# Patient Record
Sex: Male | Born: 1940 | Race: White | Hispanic: No | Marital: Single | Smoking: Former smoker
Health system: Southern US, Community
[De-identification: ages and names within clinical notes are randomized; demographics above are authoritative.]

## PROBLEM LIST (undated history)

## (undated) DIAGNOSIS — Z720 Tobacco use: Secondary | ICD-10-CM

## (undated) DIAGNOSIS — I5022 Chronic systolic (congestive) heart failure: Secondary | ICD-10-CM

## (undated) DIAGNOSIS — I10 Essential (primary) hypertension: Secondary | ICD-10-CM

## (undated) DIAGNOSIS — E785 Hyperlipidemia, unspecified: Secondary | ICD-10-CM

## (undated) DIAGNOSIS — I2510 Atherosclerotic heart disease of native coronary artery without angina pectoris: Secondary | ICD-10-CM

## (undated) DIAGNOSIS — I255 Ischemic cardiomyopathy: Secondary | ICD-10-CM

## (undated) DIAGNOSIS — I509 Heart failure, unspecified: Secondary | ICD-10-CM

## (undated) HISTORY — PX: INGUINAL HERNIA REPAIR: SUR1180

---

## 1998-02-06 ENCOUNTER — Encounter: Admission: RE | Admit: 1998-02-06 | Discharge: 1998-02-06 | Payer: Self-pay | Admitting: Hematology and Oncology

## 1998-03-12 ENCOUNTER — Encounter: Admission: RE | Admit: 1998-03-12 | Discharge: 1998-03-12 | Payer: Self-pay | Admitting: Hematology and Oncology

## 1998-03-18 ENCOUNTER — Encounter: Admission: RE | Admit: 1998-03-18 | Discharge: 1998-03-18 | Payer: Self-pay | Admitting: Hematology and Oncology

## 1998-04-25 ENCOUNTER — Encounter: Admission: RE | Admit: 1998-04-25 | Discharge: 1998-04-25 | Payer: Self-pay | Admitting: *Deleted

## 1998-07-17 ENCOUNTER — Encounter: Admission: RE | Admit: 1998-07-17 | Discharge: 1998-07-17 | Payer: Self-pay | Admitting: Internal Medicine

## 1998-07-25 ENCOUNTER — Encounter: Admission: RE | Admit: 1998-07-25 | Discharge: 1998-07-25 | Payer: Self-pay | Admitting: Internal Medicine

## 1998-09-11 ENCOUNTER — Encounter: Payer: Self-pay | Admitting: Internal Medicine

## 1998-09-11 ENCOUNTER — Ambulatory Visit (HOSPITAL_COMMUNITY): Admission: RE | Admit: 1998-09-11 | Discharge: 1998-09-11 | Payer: Self-pay | Admitting: Internal Medicine

## 1998-09-11 ENCOUNTER — Encounter: Admission: RE | Admit: 1998-09-11 | Discharge: 1998-09-11 | Payer: Self-pay | Admitting: Internal Medicine

## 1998-10-16 ENCOUNTER — Encounter: Admission: RE | Admit: 1998-10-16 | Discharge: 1998-10-16 | Payer: Self-pay | Admitting: Internal Medicine

## 1998-11-17 ENCOUNTER — Encounter: Admission: RE | Admit: 1998-11-17 | Discharge: 1998-11-17 | Payer: Self-pay | Admitting: Internal Medicine

## 1999-02-12 ENCOUNTER — Encounter: Admission: RE | Admit: 1999-02-12 | Discharge: 1999-02-12 | Payer: Self-pay | Admitting: Internal Medicine

## 1999-04-28 ENCOUNTER — Encounter: Admission: RE | Admit: 1999-04-28 | Discharge: 1999-04-28 | Payer: Self-pay | Admitting: Internal Medicine

## 1999-06-18 ENCOUNTER — Ambulatory Visit (HOSPITAL_BASED_OUTPATIENT_CLINIC_OR_DEPARTMENT_OTHER): Admission: RE | Admit: 1999-06-18 | Discharge: 1999-06-18 | Payer: Self-pay | Admitting: Orthopaedic Surgery

## 1999-09-28 ENCOUNTER — Encounter: Admission: RE | Admit: 1999-09-28 | Discharge: 1999-09-28 | Payer: Self-pay | Admitting: Internal Medicine

## 1999-12-03 ENCOUNTER — Ambulatory Visit (HOSPITAL_COMMUNITY): Admission: RE | Admit: 1999-12-03 | Discharge: 1999-12-03 | Payer: Self-pay | Admitting: Gastroenterology

## 1999-12-31 ENCOUNTER — Ambulatory Visit (HOSPITAL_COMMUNITY): Admission: RE | Admit: 1999-12-31 | Discharge: 1999-12-31 | Payer: Self-pay | Admitting: Gastroenterology

## 1999-12-31 ENCOUNTER — Encounter (INDEPENDENT_AMBULATORY_CARE_PROVIDER_SITE_OTHER): Payer: Self-pay | Admitting: *Deleted

## 2001-07-07 ENCOUNTER — Encounter: Payer: Self-pay | Admitting: Internal Medicine

## 2001-07-07 ENCOUNTER — Ambulatory Visit (HOSPITAL_COMMUNITY): Admission: RE | Admit: 2001-07-07 | Discharge: 2001-07-07 | Payer: Self-pay | Admitting: Internal Medicine

## 2001-07-14 ENCOUNTER — Ambulatory Visit (HOSPITAL_COMMUNITY): Admission: RE | Admit: 2001-07-14 | Discharge: 2001-07-14 | Payer: Self-pay | Admitting: Gastroenterology

## 2005-06-11 ENCOUNTER — Ambulatory Visit: Payer: Self-pay | Admitting: Oncology

## 2005-06-14 ENCOUNTER — Ambulatory Visit (HOSPITAL_COMMUNITY): Admission: RE | Admit: 2005-06-14 | Discharge: 2005-06-14 | Payer: Self-pay | Admitting: Internal Medicine

## 2005-11-16 ENCOUNTER — Ambulatory Visit: Payer: Self-pay | Admitting: Oncology

## 2007-03-01 ENCOUNTER — Encounter: Payer: Self-pay | Admitting: Internal Medicine

## 2007-03-01 ENCOUNTER — Ambulatory Visit: Payer: Self-pay | Admitting: Vascular Surgery

## 2007-03-01 ENCOUNTER — Inpatient Hospital Stay (HOSPITAL_COMMUNITY): Admission: EM | Admit: 2007-03-01 | Discharge: 2007-03-02 | Payer: Self-pay | Admitting: Emergency Medicine

## 2007-03-01 ENCOUNTER — Ambulatory Visit: Payer: Self-pay | Admitting: Internal Medicine

## 2007-03-01 ENCOUNTER — Encounter: Payer: Self-pay | Admitting: Vascular Surgery

## 2009-07-29 ENCOUNTER — Inpatient Hospital Stay (HOSPITAL_COMMUNITY): Admission: EM | Admit: 2009-07-29 | Discharge: 2009-08-01 | Payer: Self-pay | Admitting: Emergency Medicine

## 2011-02-12 LAB — URINALYSIS, ROUTINE W REFLEX MICROSCOPIC
Bilirubin Urine: NEGATIVE
Glucose, UA: NEGATIVE mg/dL
Ketones, ur: NEGATIVE mg/dL
pH: 6 (ref 5.0–8.0)

## 2011-02-12 LAB — GLUCOSE, CAPILLARY: Glucose-Capillary: 98 mg/dL (ref 70–99)

## 2011-02-12 LAB — BASIC METABOLIC PANEL
BUN: <1 mg/dL — ABNORMAL LOW (ref 6–23)
Calcium: 8.6 mg/dL (ref 8.4–10.5)
GFR calc non Af Amer: >60 mL/min (ref >60)
Potassium: 3.9 mEq/L (ref 3.5–5.1)
Sodium: 131 mEq/L — ABNORMAL LOW (ref 135–145)

## 2011-02-12 LAB — COMPREHENSIVE METABOLIC PANEL
Albumin: 2.8 g/dL — ABNORMAL LOW (ref 3.5–5.2)
Alkaline Phosphatase: 77 U/L (ref 39–117)
Alkaline Phosphatase: 97 U/L (ref 39–117)
BUN: 3 mg/dL — ABNORMAL LOW (ref 6–23)
BUN: 3 mg/dL — ABNORMAL LOW (ref 6–23)
BUN: 4 mg/dL — ABNORMAL LOW (ref 6–23)
CO2: 22 mEq/L (ref 19–32)
CO2: 26 mEq/L (ref 19–32)
Calcium: 8.2 mg/dL — ABNORMAL LOW (ref 8.4–10.5)
Calcium: 8.3 mg/dL — ABNORMAL LOW (ref 8.4–10.5)
Chloride: 101 mEq/L (ref 96–112)
Creatinine, Ser: 0.53 mg/dL (ref 0.4–1.5)
Creatinine, Ser: 0.6 mg/dL (ref 0.4–1.5)
Creatinine, Ser: 0.76 mg/dL (ref 0.4–1.5)
GFR calc non Af Amer: >60 mL/min (ref >60)
GFR calc non Af Amer: >60 mL/min (ref >60)
Glucose, Bld: 104 mg/dL — ABNORMAL HIGH (ref 70–99)
Glucose, Bld: 92 mg/dL (ref 70–99)
Glucose, Bld: 95 mg/dL (ref 70–99)
Potassium: 3.5 mEq/L (ref 3.5–5.1)
Potassium: 4.1 mEq/L (ref 3.5–5.1)
Total Bilirubin: 0.8 mg/dL (ref 0.3–1.2)
Total Protein: 6.3 g/dL (ref 6.0–8.3)

## 2011-02-12 LAB — DIFFERENTIAL
Basophils Absolute: 0 10*3/uL (ref 0.0–0.1)
Basophils Absolute: 0 10*3/uL (ref 0.0–0.1)
Basophils Relative: 0 % (ref 0–1)
Eosinophils Absolute: 0 10*3/uL (ref 0.0–0.7)
Lymphocytes Relative: 22 % (ref 12–46)
Lymphocytes Relative: 27 % (ref 12–46)
Lymphs Abs: 2.6 10*3/uL (ref 0.7–4.0)
Lymphs Abs: 2.9 10*3/uL (ref 0.7–4.0)
Monocytes Absolute: 1.1 10*3/uL — ABNORMAL HIGH (ref 0.1–1.0)
Monocytes Absolute: 1.3 10*3/uL — ABNORMAL HIGH (ref 0.1–1.0)
Monocytes Relative: 9 % (ref 3–12)
Neutro Abs: 7.1 10*3/uL (ref 1.7–7.7)
Neutro Abs: 7.9 10*3/uL — ABNORMAL HIGH (ref 1.7–7.7)
Neutro Abs: 8.7 10*3/uL — ABNORMAL HIGH (ref 1.7–7.7)
Neutrophils Relative %: 60 % (ref 43–77)
Neutrophils Relative %: 66 % (ref 43–77)
Neutrophils Relative %: 67 % (ref 43–77)
Neutrophils Relative %: 81 % — ABNORMAL HIGH (ref 43–77)

## 2011-02-12 LAB — CULTURE, BLOOD (ROUTINE X 2)

## 2011-02-12 LAB — RAPID URINE DRUG SCREEN, HOSP PERFORMED
Cocaine: NOT DETECTED
Opiates: NOT DETECTED

## 2011-02-12 LAB — CBC
HCT: 37.8 % — ABNORMAL LOW (ref 39.0–52.0)
HCT: 38.7 % — ABNORMAL LOW (ref 39.0–52.0)
HCT: 44.4 % (ref 39.0–52.0)
Hemoglobin: 12.8 g/dL — ABNORMAL LOW (ref 13.0–17.0)
Hemoglobin: 13.1 g/dL (ref 13.0–17.0)
Hemoglobin: 15.1 g/dL (ref 13.0–17.0)
MCHC: 33.9 g/dL (ref 30.0–36.0)
MCV: 95.2 fL (ref 78.0–100.0)
MCV: 95.8 fL (ref 78.0–100.0)
MCV: 95.8 fL (ref 78.0–100.0)
Platelets: 102 10*3/uL — ABNORMAL LOW (ref 150–400)
RBC: 3.97 MIL/uL — ABNORMAL LOW (ref 4.22–5.81)
RBC: 4.02 MIL/uL — ABNORMAL LOW (ref 4.22–5.81)
RBC: 4.04 MIL/uL — ABNORMAL LOW (ref 4.22–5.81)
RBC: 4.63 MIL/uL (ref 4.22–5.81)
RDW: 14.2 % (ref 11.5–15.5)
WBC: 11.9 10*3/uL — ABNORMAL HIGH (ref 4.0–10.5)
WBC: 13 10*3/uL — ABNORMAL HIGH (ref 4.0–10.5)

## 2011-02-12 LAB — URINE CULTURE

## 2011-02-12 LAB — MAGNESIUM: Magnesium: 1.7 mg/dL (ref 1.5–2.5)

## 2011-02-12 LAB — LACTIC ACID, PLASMA: Lactic Acid, Venous: 3.2 mmol/L — ABNORMAL HIGH (ref 0.5–2.2)

## 2011-02-12 LAB — PHOSPHORUS
Phosphorus: 4.1 mg/dL (ref 2.3–4.6)
Phosphorus: 4.2 mg/dL (ref 2.3–4.6)

## 2011-03-26 NOTE — Procedures (Signed)
[place]  Patient:    Vincent Dunlap, Vincent Dunlap                         MRN: [id number] Proc. Date: 12/31/99 Adm. Date:  [id number] Attending:  Juanita Craver CC:         Dianna Rossetti. Cloward, M.D., [place], [place]., [place]                           Procedure Report  DATE OF BIRTH:  01-14-1941.  REFERRING PHYSICIAN:  Davis L. Cloward, M.D.  PROCEDURE PERFORMED:  Colonoscopy with snare polypectomy x 2.  ENDOSCOPIST:  Nelwyn Salisbury, M.D.  INSTRUMENT USED:  Olympus video colonoscope.  INDICATION FOR PROCEDURE:  Polyps seen on flexible sigmoidoscopy in a 70 year old male.  To rule out other problems in the colon, polypectomy planned.  PREPROCEDURE PREPARATION:  Informed consent was procured from the patient.  The  patient was fasted for eight hours prior to the procedure and prepped with a bottle of magnesium citrate and a gallon of NuLytely the night prior to the procedure.   PREPROCEDURE PHYSICAL:  VITAL SIGNS:  Patient had stable vital signs.  NECK:  Supple.  CHEST:  Clear to auscultation.  S1 and S2 regular.  ABDOMEN:  Soft with normal abdominal bowel sounds.  DESCRIPTION OF PROCEDURE:  The patient was placed in the left lateral decubitus  position and sedated with 100 mg of Demerol and 10 mg of Versed intravenously.  Once the patient was adequately sedate and maintained on low-flow oxygen and continuous cardiac monitoring, the Olympus video colonoscope was advanced from he rectum the cecum without difficulty.  The left-sided diverticula were seen in the left colon.  Two polyps were removed by snare polypectomy; these measured about  3 to 4 mm in size.  These were sessile polyps.  The patient had small non-bleeding internal hemorrhoids and tolerated the procedure well without complication.  IMPRESSION: 1. Small non-bleeding internal hemorrhoids. 2. A few left-sided diverticula. 3. Two polyps snared from 10 and 20 cm,  respectively; otherwise, normal-appearing    cecum, right colon and transverse colon. 4. Some residual stool in the right colon.  RECOMMENDATION: 1. Await pathology results. 2. Avoid nonsteroidals for two weeks. 3. Outpatient followup in the next two weeks. DD:  12/31/99 TD:  12/31/99 Job: [id number]
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
## 2011-03-26 NOTE — Procedures (Signed)
[facility]  Patient:    Vincent Dunlap, Vincent Dunlap Visit Number: [id number] MRN: [id number]          Service Type: END Location: ENDO Attending Physician:  Juanita Craver Dictated by:   Nelwyn Salisbury, M.D. Proc. Date: 07/14/01 Admit Date:  07/14/2001   CC:         Davis L. Cloward, M.D., [place], [place]   Procedure Report  DATE OF BIRTH:  [birth year].  PROCEDURE:  Esophagogastroduodenoscopy with Venia Minks dilatation of a distal esophageal stricture.  ENDOSCOPIST:  Nelwyn Salisbury, M.D.  INSTRUMENT USED:  Olympus video panendoscope.  INDICATION FOR PROCEDURE:  Dysphagia, especially for solids, in a 70 year old white male.  Barium swallow shows distal esophageal narrowing.  PREPROCEDURE PREPARATION:  Informed consent was procured from the patient. The patient was fasted for eight hours prior to the procedure.  PREPROCEDURE PHYSICAL:  VITAL SIGNS:  The patient had stable vital signs.  NECK:  Supple.  CHEST:  Clear to auscultation.  S1, S2 regular.  ABDOMEN:  Soft with normal bowel sounds.  DESCRIPTION OF PROCEDURE:  The patient was placed in the left lateral decubitus position and sedated with 70 mg of Demerol and 8 mg of Versed intravenously.  Once the patient was adequately sedate and maintained on low-flow oxygen and continuous cardiac monitoring, the Olympus video panendoscope was advanced through the mouthpiece, over the tongue, into the esophagus under direct vision.  The proximal esophagus appeared normal.  There was narrowing seen at the GE junction.  The scope was gently passed through this area.  A small hiatal hernia was appreciated on retroflexion in the high cardia.  There was diffuse gastritis of the gastric mucosa with no frank ulcers or erosions seen.  The proximal small bowel appeared normal.  Once the EGD was completed, the patient was turned to the supine position and made to sit up.  He was then dilated with  serial [facility] dilators measuring 34, 38, 44, 48, and 50 mm in size.  There was heme on the last dilator.  The patient tolerated the procedure well without apparent complications.  IMPRESSION: 1. Distal esophageal stricture, dilated with [facility] dilators. 2. Diffuse gastritis no ulcer seen. 3. Normal proximal small bowel.  RECOMMENDATIONS: 1. The patient has been given a prescription for Nexium 40 mg one p.o. q.d.,    #30 with three refills. 2. Carafate slurry 1 g q.i.d. has been prescribed for the next 30 days. 3. Outpatient follow-up is advised within the next two weeks.  The patient    has been advised to refrain from use of all nonsteroidals in the interim. Dictated by:   Nelwyn Salisbury, M.D. Attending Physician:  Juanita Craver DD:  07/14/01 TD:  07/15/01 Job: [id number] POL/[id number]

## 2011-03-26 NOTE — Discharge Summary (Signed)
Vincent Dunlap, Vincent Dunlap                  ACCOUNT NO.:  [id number]   MEDICAL RECORD NO.:  [id number]          PATIENT TYPE:  INP   LOCATION:  3707                         FACILITY:  [place]   PHYSICIAN:  Adella Hare, M.D.      DATE OF BIRTH:  10-15-1941   DATE OF ADMISSION:  03/01/2007  DATE OF DISCHARGE:                               DISCHARGE SUMMARY   PRIMARY CARE PHYSICIAN:  Dr. Gwenlyn Perking with [facility].   DISCHARGE DIAGNOSIS:  1. Syncope, most likely vasovagal in etiology.  Note, the patient had      a negative MRI, carotid Dopplers, 2D echo, as well as a chest CT.  2. Emphysema.  Significant emphysema and chronic lung disease noted on      CT of the chest on March 01, 2007.  3. Thoracic disk osteophytes that simulate a low mass on plain      radiograph off the chest.   PAST MEDICAL HISTORY:  1. Gout.  2. Hypertension.  3. Depression.  4. Chronic pain.  5. Alcohol abuse.  6. Tobacco abuse.   DISCHARGE MEDICATIONS:  No changes have been made to the discharge  medications.  He will continue taking his Lotrel 10/20 mg daily,  allopurinol 300 mg daily, Celexa 20 mg daily, OxyContin and Percocet as  before.  The patient has been advised to take centrum silver on a daily  basis in order to obtain thiamine and folate supplementation to prevent  Wernicke encephalopathy.   FOLLOWUP:  At this point, the patient will follow up with Dr. [name] on  March 03, 2007.  Please note that the patient had a thorough workup to  rule out cardiac and neurogenic causes of syncope.  If the patient  continues to have more symptoms, he may be referred to a cardiologist  for confirmation of vasovagal syncope by performing a tilt-table test.  Also note, the patient showed evidence of severe emphysema on CT of the  chest that was done to rule out PE.   PROCEDURES DONE:  1. MRI of the brain performed on March 01, 2007 which showed no acute      infarct, abnormal intracranial enhancing lesions, mild  atrophy,      mild non-specific white matter disease-type changes within the pons      probably related to cerebellar small lobe disease.  2. CT of the chest performed on March 01, 2007 showed slight      suboptimal filling of the primary arterial tree, but no obvious      primary thromboembolism.  Thoracic disk osteophyte simulated lower      lobe mass on plain radiograph.  Dense coronary artery      calcifications.  Significant emphysema and chronic lung disease.  3. 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  4. A 2D echocardiogram performed on March 01, 2007 showed a left      ventricular EF of 65 to 70%.  Study inadequate for evaluation of      left ventricular regional  wall motion abnormality.   ADMISSION HISTORY AND PHYSICAL:  Vincent Dunlap is a very pleasant 70 year old  male with a past medical history as indicated above that presented to  the emergency room because of 2 to 3 episodes of passing out whenever  the patient tries to get up either from a chair or, most recently, went  to the bathroom.  They last about 10 to 12 minutes and when the patient  wakes up he is completely alert and oriented times 3.  Since they were  happening with increasing frequency, he decided to come to the emergency  room to get it evaluated.  The patient denied any chest pain, shortness  of breath, nausea, vomiting, diaphoresis.  He denied headaches,  seizures, or any focal neurological deficits, except for one episode  that was associated with left facial numbness and left arm numbness.   HOSPITAL COURSE:  1. Syncope, most likely vasovagal in etiology.  Since the patient has      associated left facial numbness and left arm numbness in one of the      episodes, a MRI was obtained which was negative for a stroke.      Also, carotid Dopplers were done which showed only 40 to 60% ICA      stenosis.  The patient also had a 2D echocardiogram, as well as       cardiac enzymes cycles, which were negative as well.  He also had a      CT of the chest to rule out PE which only showed significant      emphysema, but no pulmonary embolism.  Since the syncope has always      been associated with posture, it seems most likely that is it is      vasovagal in origin.  The patient has been advised to get up very      slowly and let his body adjust to the new posture so he does not      pass out.  This can further be confirmed as an outpatient by a tilt-      table test if needed.  2. Significant emphysema.  This was noted on CT chest performed to      rule out PE.  The patient has been advised about this and has been      told to strongly consider stopping smoking.  Also note that the      mass seen on chest x-ray was really a thoracic osteophyte that was      simulating a mass.  3. Alcohol abuse.  Again, the patient was counseled to stop drinking.      He was also informed that as long as he continues drinking, he      needs to get thiamine and folate every single day to prevent      development of Wernicke encephalopathy.  4. All other conditions were stable on home meds.  For further      details, please refer to the hospital discharge summary.      Adella Hare, M.D.  Electronically Signed     BP/MEDQ  D:  03/02/2007  T:  03/02/2007  Job:  [id number]   cc:   Dr. Gwenlyn Perking

## 2011-03-26 NOTE — Procedures (Signed)
[place]. [facility]  Patient:    Vincent Dunlap                          MRN: [id number] Proc. Date: 12/03/99 Adm. Date:  [id number] Disc. Date: [id number] Attending:  Juanita Craver CC:         Dr. Guinevere Ferrari                           Procedure Report  DATE OF BIRTH:  Jun 30, 1941  REFERRING PHYSICIAN:  Dr. Guinevere Ferrari.  PROCEDURE PERFORMED:  Flexible sigmoidoscopy.  ENDOSCOPIST:  Nelwyn Salisbury, M.D.  INSTRUMENT USED:  Olympus video colonoscope.  INDICATIONS FOR PROCEDURE:  Screening flexible sigmoidoscopy being planned in a  70 year old white male, rule out polyps, masses, hemorrhoids, etc.  PREPROCEDURE PREPARATION:  Informed consent was procured from the patient.  The  patient was fasted for eight hours prior to the procedure, and prepped with two  Fleets enemas the morning of the procedure.  PREPROCEDURE PHYSICAL:  VITAL SIGNS:  Stable.  NECK:  Supple.  CHEST:  Clear to auscultation, S1 and S2 regular.  ABDOMEN:  Soft with normal abdominal bowel sounds.  DESCRIPTION OF PROCEDURE:  The patient was placed in the left lateral decubitus  position and sedation was used.  Once the patient was adequately positioned, the Olympus video colonoscope was advanced from the rectum to about 20 cm and was withdrawn from that point.  A large pedunculated polyp was seen at 20 cm and the plans were to proceed with a colonoscopy.  IMPRESSION:  Anterior polyp at 20 cm.  RECOMMENDATIONS:  Proceed with colonoscopy at the earliest. DD:  12/03/99 TD:  12/05/99 Job: [id number]

## 2011-07-29 ENCOUNTER — Other Ambulatory Visit (HOSPITAL_COMMUNITY): Payer: Self-pay

## 2011-08-19 ENCOUNTER — Encounter (HOSPITAL_COMMUNITY)
Admission: RE | Admit: 2011-08-19 | Discharge: 2011-08-19 | Disposition: A | Discharge status: Home / Self Care | Payer: Medicare Other | Source: Ambulatory Visit | Attending: Orthopaedic Surgery | Admitting: Orthopaedic Surgery

## 2011-08-19 ENCOUNTER — Other Ambulatory Visit (HOSPITAL_COMMUNITY): Payer: Self-pay | Admitting: Orthopaedic Surgery

## 2011-08-19 DIAGNOSIS — M1711 Unilateral primary osteoarthritis, right knee: Secondary | ICD-10-CM

## 2011-08-19 LAB — PROTIME-INR: Prothrombin Time: 13.3 seconds (ref 11.6–15.2)

## 2011-08-19 LAB — TYPE AND SCREEN: ABO/RH(D): O POS

## 2011-08-19 LAB — SURGICAL PCR SCREEN: MRSA, PCR: NEGATIVE

## 2011-08-19 LAB — COMPREHENSIVE METABOLIC PANEL
BUN: 11 mg/dL (ref 6–23)
Calcium: 9.6 mg/dL (ref 8.4–10.5)
GFR calc Af Amer: >90 mL/min (ref >90)
Glucose, Bld: 91 mg/dL (ref 70–99)
Sodium: 138 mEq/L (ref 135–145)
Total Protein: 7 g/dL (ref 6.0–8.3)

## 2011-08-19 LAB — DIFFERENTIAL
Basophils Absolute: 0 10*3/uL (ref 0.0–0.1)
Basophils Relative: 0 % (ref 0–1)
Monocytes Absolute: 0.8 10*3/uL (ref 0.1–1.0)
Neutro Abs: 7.2 10*3/uL (ref 1.7–7.7)

## 2011-08-19 LAB — CBC
MCH: 30.3 pg (ref 26.0–34.0)
MCV: 91.2 fL (ref 78.0–100.0)
Platelets: 61 10*3/uL — ABNORMAL LOW (ref 150–400)
RDW: 14.2 % (ref 11.5–15.5)
WBC: 12.9 10*3/uL — ABNORMAL HIGH (ref 4.0–10.5)

## 2011-08-27 ENCOUNTER — Inpatient Hospital Stay (HOSPITAL_COMMUNITY)
Admission: RE | Admit: 2011-08-27 | Discharge: 2011-08-31 | DRG: 470 | Disposition: A | Discharge status: Home Health | Payer: Medicare Other | Source: Ambulatory Visit | Attending: Orthopaedic Surgery | Admitting: Orthopaedic Surgery

## 2011-08-27 ENCOUNTER — Inpatient Hospital Stay (HOSPITAL_COMMUNITY): Payer: Medicare Other

## 2011-08-27 DIAGNOSIS — F17200 Nicotine dependence, unspecified, uncomplicated: Secondary | ICD-10-CM | POA: Diagnosis present

## 2011-08-27 DIAGNOSIS — I10 Essential (primary) hypertension: Secondary | ICD-10-CM | POA: Diagnosis present

## 2011-08-27 DIAGNOSIS — K219 Gastro-esophageal reflux disease without esophagitis: Secondary | ICD-10-CM | POA: Diagnosis present

## 2011-08-27 DIAGNOSIS — IMO0002 Reserved for concepts with insufficient information to code with codable children: Principal | ICD-10-CM | POA: Diagnosis present

## 2011-08-27 DIAGNOSIS — M1710 Unilateral primary osteoarthritis, unspecified knee: Principal | ICD-10-CM | POA: Diagnosis present

## 2011-08-27 NOTE — Op Note (Signed)
NAMECALISTRO, RAUF                  ACCOUNT NO.:  [id number]  MEDICAL RECORD NO.:  [id number]  LOCATION:  7322                         FACILITY:  [place]  PHYSICIAN:  Caelen Higinbotham C. Lorin Mercy, M.D.    DATE OF BIRTH:  [birth year]  DATE OF PROCEDURE:  08/27/2011 DATE OF DISCHARGE:                              OPERATIVE REPORT   PREOPERATIVE DIAGNOSIS:  Right knee osteoarthritis.  POSTOPERATIVE DIAGNOSIS:  Right knee osteoarthritis.  PROCEDURE:  Right total knee arthroplasty.  ANESTHESIA:  Preoperative femoral nerve block and Marcaine skin local.  SURGEON:  Yoshi Mancillas C. Lorin Mercy, MD  ASSISTANT:  Epimenio Foot, P A-C medically necessary and present for the entire procedure.  TOURNIQUET TIME:  1 hour and 20 minutes.  ESTIMATED BLOOD LOSS:  Minimal.  A 70 year old male with left total knee arthroplasty progressive right knee, varus deformity, bone-on-bone changes, marginal osteophytes, subchondral sclerosis, greater than 15 degrees of varus, inability to reach full extension, and 15-degree knee flexion contracture with progressive pain, persistent synovitis, failed anti-inflammatories, and aspiration with cortisone injection.  He has been ambulatory with a cane.  After standard prepping and draping with intubation and placement of Foley catheter, preoperative Ancef prophylaxis given and induction time. Proximal thigh tourniquet had been applied, lateral post bump 10/15 drape and DuraPrep.  As the DuraPrep was drying, time-out procedure was completed.  Split sheets, drapes, sterile skin marker, Betadine, Steri- Drape after impervious stockinette and Coban had been added.  Leg was wrapped in Esmarch, tourniquet inflated.  Midline incision was made. The superficial retinaculum was relatively thin layer, had to be sharply dissected to elevate it to start the plane.  Minimal subcutaneous tissue was present over the quad tendon.  Medial parapatellar incision was made and there was a significant clear  synovitis present, which was suctioned.  There were multiple calcified bodies within the joint with some loose pieces up in the suprapatellar pouch with chronic synovitis surrounding them, these were debrided.  Multiple 1-cm loose bodies were removed, some were in the medial gutter, some suprapatellar pouch. There was bone-on-bone changes in medial compartment, erosive changes in scalping down 1.5 cm half of the posteromedial and tibial plateau with no cartilage left on the medial femoral condyle.  Computer pins were inserted once in the femur inside the incision, stab incisions in mid tibia and then computer model was made for assistance in appropriate cutting for the varus deformity and flexion deformity.  2 mm taken off the femur on the tibia side 12 mm had to be resected to get down below the level of erosive wear in the medial tibial plateau.  A #4 size of the femur, chamfer cuts were made, box cut was made after the tibia was cut.  The range of motion prior to cuts confirmed the flexion contracture varus deformity as expected.  There was 1 cm osteophytes on both sides of the femur, which were trimmed away and also large osteophytes on the medial tibial plateau, which were debrided.  Once cuts were made on the tibia with #4 for De[employer]uy [employer] knee, 10 mm spacer was inserted trials after box cut and the tibia was sized 4,  femur size 4, 10 mm tray.  Medial side was slightly tight in both flexion/extension and the medial collateral ligament was partially stripped deep fibers off the tibia for about 1.5 cm and this corrected the excessive tightness giving symmetrical tightness in flexion/extension.  There was full extension, good flexion. Three quarter-curved osteotome was used to remove the posterior spurs on the femur and there were multiple popliteal loose bodies that were removed.  Pulsatile lavage was used for preparation after removal of the trials.   Tibia cemented first followed by the femur, placed on the tibial tray and the patella which was sized 41, which was cut from the facet to facet.  All poly patella was used with 3 pegs.  Tibia was DePuy rotating platform, #4 size with lug holes.  #4 tibia and 10 mm rotating platform posterior cruciate substituting.  Pulsatile lavage was used after cement was hardened at 15 minutes.  All excess cement had been removed, tourniquet was deflated.  There was minimal bleeding.  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  Standard layered closure with Ethibond sutures interrupted figure-of-eight in the quad tendon had been split between the medial one- third and lateral two-thirds.  Medial parapatellar incision was closed and the patellar tendon repaired.  Superficial retinaculum subcu closed with 2-0 Vicryl.  Subcuticular closure with 3-0 Vicryl.  Tincture of benzoin, Steri-Strips, Marcaine infiltration.  Marcaine was infiltrated 25 via needle and then standard postoperative dressing with knee immobilizer was applied.  Instrument count and needle count was correct.     Linkon Siverson C. Lorin Mercy, M.D.     MCY/MEDQ  D:  08/27/2011  T:  08/27/2011  Job:  [id number]  Electronically Signed by Rodell Perna M.D. on 08/27/2011 05:57:30 PM

## 2011-08-28 LAB — BASIC METABOLIC PANEL
CO2: 23 mEq/L (ref 19–32)
Calcium: 8.5 mg/dL (ref 8.4–10.5)
Creatinine, Ser: 0.58 mg/dL (ref 0.50–1.35)
GFR calc Af Amer: >90 mL/min (ref >90)

## 2011-08-28 LAB — URINE CULTURE: Culture  Setup Time: 201210190948

## 2011-08-28 LAB — CBC
MCH: 31.3 pg (ref 26.0–34.0)
MCHC: 35.1 g/dL (ref 30.0–36.0)
MCV: 89.3 fL (ref 78.0–100.0)
Platelets: 58 10*3/uL — ABNORMAL LOW (ref 150–400)
RDW: 14.2 % (ref 11.5–15.5)

## 2011-08-28 LAB — PROTIME-INR: Prothrombin Time: 14.2 seconds (ref 11.6–15.2)

## 2011-08-29 LAB — BASIC METABOLIC PANEL
CO2: 23 mEq/L (ref 19–32)
Calcium: 8.8 mg/dL (ref 8.4–10.5)
Chloride: 91 mEq/L — ABNORMAL LOW (ref 96–112)
Glucose, Bld: 117 mg/dL — ABNORMAL HIGH (ref 70–99)
Sodium: 123 mEq/L — ABNORMAL LOW (ref 135–145)

## 2011-08-29 LAB — CBC
HCT: 30.3 % — ABNORMAL LOW (ref 39.0–52.0)
RDW: 13.9 % (ref 11.5–15.5)
WBC: 17.4 10*3/uL — ABNORMAL HIGH (ref 4.0–10.5)

## 2011-08-30 LAB — CBC
HCT: 28.5 % — ABNORMAL LOW (ref 39.0–52.0)
Hemoglobin: 10 g/dL — ABNORMAL LOW (ref 13.0–17.0)
MCHC: 35.1 g/dL (ref 30.0–36.0)
RBC: 3.2 MIL/uL — ABNORMAL LOW (ref 4.22–5.81)
WBC: 15.4 10*3/uL — ABNORMAL HIGH (ref 4.0–10.5)

## 2011-08-30 LAB — PROTIME-INR: INR: 4.33 — ABNORMAL HIGH (ref 0.00–1.49)

## 2011-08-31 LAB — PROTIME-INR: INR: 3.86 — ABNORMAL HIGH (ref 0.00–1.49)

## 2011-09-16 NOTE — Discharge Summary (Signed)
NAMEDOC, MANDALA                  ACCOUNT NO.:  [id number]  MEDICAL RECORD NO.:  [id number]  LOCATION:  5037                         FACILITY:  [facility]  PHYSICIAN:  Mark C. Ophelia Charter, M.D.    DATE OF BIRTH:  1941/10/22  DATE OF ADMISSION:  08/27/2011 DATE OF DISCHARGE:  08/31/2011                              DISCHARGE SUMMARY   ADMISSION DIAGNOSES: 1. Right knee osteoarthritis. 2. History of depression. 3. Gastroesophageal reflux disease. 4. Hypertension. 5. Gout.  DISCHARGE DIAGNOSES: 1. Right knee osteoarthritis. 2.  History of depression. 2. Gastroesophageal reflux disease. 3. Hypertension. 4. Gout.  PROCEDURE:  On August 27, 2011, the patient underwent right total knee arthroplasty performed by Dr. Ophelia Charter assisted by Maud Deed, PA-C under general anesthesia.  CONSULTATIONS:  None.  BRIEF HISTORY:  The patient is a 70 year old male with chronic and progressive right knee pain secondary to osteoarthritis.  He has failed conservative treatment including anti-inflammatory medications as well as activity modification and intra-articular steroid injections.  It was felt that he would benefit from surgical intervention and was admitted for the procedure as stated above.  BRIEF HOSPITAL COURSE:  The patient tolerated the procedure under general anesthesia without complications.  Postoperatively, he was placed on Coumadin for DVT prophylaxis.  Adjustments in Coumadin dose made according to daily pro times by the pharmacist at [facility]. The patient was started on physical therapy for ambulation and gait training, range of motion, stretching, and strengthening exercises.  He was able to advance with physical therapy with ambulation.  CPM was used for range of motion as well.  His dressing was healing well throughout the hospital stay.  The patient was having mild edema about the knee. Ice packs were utilized.  The patient was able to take a regular diet. He was voiding  independently once his catheter was discontinued.  The patient was stable for discharge to his home with arrangements made for home health physical therapy.  PERTINENT LABORATORY VALUES:  At discharge, hemoglobin 10, hematocrit 28.5.  INR at discharge 3.86.  Chemistry studies on August 29, 2011 with sodium 123, glucose 117, BUN 5, remaining values within normal limits.  Urine culture from August 28, 2011 showed no growth.  PLAN:  The patient was discharged to his home.  Arrangements made for home health physical therapy as well as durable medical equipment and nurse for Coumadin management.  The patient was given prescriptions for Coumadin as directed by the pharmacist.  Percocet 5/325, 1-2 every 4-6 hours as needed for pain.  He will continue home medications as taken prior to admission with the exception of his aspirin.  The patient was instructed to change his dressing daily or as needed and to keep his wound covered.  He will wear his knee immobilizer for ambulation.  He will use ice packs to the knee as needed for swelling.  Continue to work on progressive range of motion of the knee.  The patient will follow up with Dr. Ophelia Charter in 1 week.  He will continue on a heart healthy diet. All questions encouraged and answered.  CONDITION ON DISCHARGE:  Stable.     Wende Neighbors, P.A.  ______________________________ Veverly Fells Ophelia Charter, M.D.    SMV/MEDQ  D:  09/16/2011  T:  09/16/2011  Job:  [id number]

## 2011-11-15 DIAGNOSIS — K219 Gastro-esophageal reflux disease without esophagitis: Secondary | ICD-10-CM | POA: Diagnosis not present

## 2011-11-15 DIAGNOSIS — IMO0002 Reserved for concepts with insufficient information to code with codable children: Secondary | ICD-10-CM | POA: Diagnosis not present

## 2011-11-15 DIAGNOSIS — E785 Hyperlipidemia, unspecified: Secondary | ICD-10-CM | POA: Diagnosis not present

## 2011-11-15 DIAGNOSIS — I10 Essential (primary) hypertension: Secondary | ICD-10-CM | POA: Diagnosis not present

## 2012-02-14 DIAGNOSIS — J019 Acute sinusitis, unspecified: Secondary | ICD-10-CM | POA: Diagnosis not present

## 2012-02-14 DIAGNOSIS — E785 Hyperlipidemia, unspecified: Secondary | ICD-10-CM | POA: Diagnosis not present

## 2012-02-14 DIAGNOSIS — IMO0002 Reserved for concepts with insufficient information to code with codable children: Secondary | ICD-10-CM | POA: Diagnosis not present

## 2012-02-14 DIAGNOSIS — I10 Essential (primary) hypertension: Secondary | ICD-10-CM | POA: Diagnosis not present

## 2012-02-14 DIAGNOSIS — M109 Gout, unspecified: Secondary | ICD-10-CM | POA: Diagnosis not present

## 2012-02-14 DIAGNOSIS — Z0000 Encounter for general adult medical examination without abnormal findings: Secondary | ICD-10-CM | POA: Diagnosis not present

## 2012-02-21 DIAGNOSIS — M109 Gout, unspecified: Secondary | ICD-10-CM | POA: Diagnosis not present

## 2012-02-21 DIAGNOSIS — E785 Hyperlipidemia, unspecified: Secondary | ICD-10-CM | POA: Diagnosis not present

## 2012-02-21 DIAGNOSIS — I10 Essential (primary) hypertension: Secondary | ICD-10-CM | POA: Diagnosis not present

## 2012-02-21 DIAGNOSIS — F17200 Nicotine dependence, unspecified, uncomplicated: Secondary | ICD-10-CM | POA: Diagnosis not present

## 2012-02-21 DIAGNOSIS — K219 Gastro-esophageal reflux disease without esophagitis: Secondary | ICD-10-CM | POA: Diagnosis not present

## 2012-08-08 DIAGNOSIS — Z23 Encounter for immunization: Secondary | ICD-10-CM | POA: Diagnosis not present

## 2012-08-22 DIAGNOSIS — M899 Disorder of bone, unspecified: Secondary | ICD-10-CM | POA: Diagnosis not present

## 2012-08-22 DIAGNOSIS — I10 Essential (primary) hypertension: Secondary | ICD-10-CM | POA: Diagnosis not present

## 2012-08-22 DIAGNOSIS — M109 Gout, unspecified: Secondary | ICD-10-CM | POA: Diagnosis not present

## 2012-08-22 DIAGNOSIS — E785 Hyperlipidemia, unspecified: Secondary | ICD-10-CM | POA: Diagnosis not present

## 2012-08-23 ENCOUNTER — Telehealth: Payer: Self-pay | Admitting: Internal Medicine

## 2012-08-23 NOTE — Telephone Encounter (Signed)
S/W pt aide in re NP appt 10/23 @ 1:30 w/Dr. Julien Nordmann. Referring Dr. Noah Delaine Dx- Acute Chronic Thrombocytopenia

## 2012-08-24 ENCOUNTER — Telehealth: Payer: Self-pay | Admitting: Internal Medicine

## 2012-08-24 NOTE — Telephone Encounter (Signed)
C/D 08/24/12 for appt. 08/30/12

## 2012-08-29 DIAGNOSIS — M109 Gout, unspecified: Secondary | ICD-10-CM | POA: Diagnosis not present

## 2012-08-29 DIAGNOSIS — K219 Gastro-esophageal reflux disease without esophagitis: Secondary | ICD-10-CM | POA: Diagnosis not present

## 2012-08-29 DIAGNOSIS — I10 Essential (primary) hypertension: Secondary | ICD-10-CM | POA: Diagnosis not present

## 2012-08-29 DIAGNOSIS — E785 Hyperlipidemia, unspecified: Secondary | ICD-10-CM | POA: Diagnosis not present

## 2012-08-29 DIAGNOSIS — F17200 Nicotine dependence, unspecified, uncomplicated: Secondary | ICD-10-CM | POA: Diagnosis not present

## 2012-08-30 ENCOUNTER — Other Ambulatory Visit (HOSPITAL_BASED_OUTPATIENT_CLINIC_OR_DEPARTMENT_OTHER): Payer: Medicare Other | Admitting: Lab

## 2012-08-30 ENCOUNTER — Encounter: Payer: Self-pay | Admitting: Internal Medicine

## 2012-08-30 ENCOUNTER — Ambulatory Visit (HOSPITAL_BASED_OUTPATIENT_CLINIC_OR_DEPARTMENT_OTHER): Payer: Medicare Other | Admitting: Internal Medicine

## 2012-08-30 ENCOUNTER — Ambulatory Visit: Payer: Medicare Other

## 2012-08-30 VITALS — BP 130/83 | HR 91 | Temp 96.9°F | Resp 20 | Ht 71.0 in | Wt 198.1 lb

## 2012-08-30 DIAGNOSIS — I10 Essential (primary) hypertension: Secondary | ICD-10-CM

## 2012-08-30 DIAGNOSIS — F1020 Alcohol dependence, uncomplicated: Secondary | ICD-10-CM

## 2012-08-30 DIAGNOSIS — D696 Thrombocytopenia, unspecified: Secondary | ICD-10-CM

## 2012-08-30 DIAGNOSIS — F329 Major depressive disorder, single episode, unspecified: Secondary | ICD-10-CM

## 2012-08-30 LAB — CBC WITH DIFFERENTIAL/PLATELET
Basophils Absolute: 0 10*3/uL (ref 0.0–0.1)
Eosinophils Absolute: 0.2 10*3/uL (ref 0.0–0.5)
HCT: 37.4 % — ABNORMAL LOW (ref 38.4–49.9)
HGB: 12.5 g/dL — ABNORMAL LOW (ref 13.0–17.1)
MONO#: 0.9 10*3/uL (ref 0.1–0.9)
NEUT#: 4.6 10*3/uL (ref 1.5–6.5)
NEUT%: 51 % (ref 39.0–75.0)
WBC: 9.1 10*3/uL (ref 4.0–10.3)
lymph#: 3.3 10*3/uL (ref 0.9–3.3)

## 2012-08-30 LAB — COMPREHENSIVE METABOLIC PANEL (CC13)
Albumin: 3.5 g/dL (ref 3.5–5.0)
BUN: 10 mg/dL (ref 7.0–26.0)
CO2: 23 mEq/L (ref 22–29)
Calcium: 9.3 mg/dL (ref 8.4–10.4)
Chloride: 103 mEq/L (ref 98–107)
Creatinine: 0.8 mg/dL (ref 0.7–1.3)
Potassium: 4.5 mEq/L (ref 3.5–5.1)

## 2012-08-30 LAB — LACTATE DEHYDROGENASE (CC13): LDH: 157 U/L (ref 125–220)

## 2012-08-30 NOTE — Progress Notes (Signed)
Checked in new patient. No financial issues. °

## 2012-08-30 NOTE — Patient Instructions (Signed)
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 Continue routine followup visit and observation by your family doctor. Please call if you have any bleeding issues.

## 2012-08-30 NOTE — Progress Notes (Signed)
[facility] [phone]   [phone]  CONSULT NOTE  REASON FOR CONSULTATION:  71 years old white male with thrombocytopenia.  HPI Vincent Dunlap is a 71 y.o. male was past medical history significant for hypertension, GERD, gout, depression and osteoarthritis. The patient also has a long history of thrombocytopenia for several years. The patient was evaluated at the [facility] on 06/15/2005 by my partner Dr. Clelia Croft for a similar problem with platelets count at that time all 56,000. It was felt at that time that his thrombocytopenia most likely secondary to his chronic alcohol history in addition to splenomegaly and sequestration of platelets in addition to questionable drug-induced especially with his history of gout and his use of allopurinol and colchicine. The patient mentions that he did not have any recent count at that time he is currently off treatment with allopurinol and colchicine. He was seen recently by his primary care physician and repeat CBC on 08/22/2012 showed platelets count were down to 28,000. Previous CBC on 02/14/2012 showed platelets count of 72,000. The patient was referred to me today for further evaluation and recommendation regarding his thrombocytopenia. He is feeling fine today with no specific complaints. Over the last few years his platelets count has been in the range of 50,000-150,000. He denied having any bleeding issues, bruises or ecchymosis. The patient is still drinks alcohol but he said that it much less than what he used to do in the past. He denied having any significant weight loss or night sweats. He has no chest pain, shortness breath, cough or hemoptysis.  Past medical history: Significant for osteoarthritis, depression, GERD, hypertension and gout.  Family history: Mother died from old age in her 43s. No history of cancer or blood disease and his family.   Social History: The patient is single and has  4 children. He used to work as a Psychologist, occupational. He has a history of smoking one pack per day for around 30 years but he quit 3 months ago. He also has a history of heavy alcohol abuse but he mentions that he drinks 2-3 times a week now. He has no history of drug abuse.   Allergies  Allergen Reactions  . Chantix (Varenicline)     Current Outpatient Prescriptions  Medication Sig Dispense Refill  . amitriptyline (ELAVIL) 25 MG tablet Take 25 mg by mouth at bedtime.      Marland Kitchen amLODipine-benazepril (LOTREL) 5-20 MG per capsule Take 1 capsule by mouth daily.      Marland Kitchen aspirin 81 MG tablet Take 81 mg by mouth daily.      . colchicine 0.6 MG tablet Take 0.6 mg by mouth daily as needed.      . cyclobenzaprine (FLEXERIL) 10 MG tablet Take 10 mg by mouth 3 (three) times daily as needed.      . ergocalciferol (VITAMIN D2) 50000 UNITS capsule Take 50,000 Units by mouth once a week.      . esomeprazole (NEXIUM) 20 MG capsule Take 20 mg by mouth daily before breakfast.      . fish oil-omega-3 fatty acids 1000 MG capsule Take 1 g by mouth 3 (three) times daily.      . Multiple Vitamin (MULTIVITAMIN) tablet Take 1 tablet by mouth daily.      . OxyCODONE HCl (OXYCONTIN PO) Take 30 mg by mouth 2 (two) times daily.      Marland Kitchen oxyCODONE-acetaminophen (PERCOCET/ROXICET) 5-325 MG per tablet Take 1 tablet by mouth every  6 (six) hours as needed.        Review of Systems  A comprehensive review of systems was negative.  Physical Exam  RUE:AVWUJ, healthy, no distress, well nourished and well developed SKIN: skin color, texture, turgor are normal HEAD: Normocephalic EYES: normal EARS: External ears normal OROPHARYNX:no exudate and no erythema  NECK: supple, no adenopathy LYMPH:  no palpable lymphadenopathy, no hepatosplenomegaly LUNGS: clear to auscultation  HEART: regular rate & rhythm, no murmurs and no gallops ABDOMEN:abdomen soft, non-tender, normal bowel sounds and no masses or organomegaly BACK: Back symmetric, no  curvature. EXTREMITIES:no joint deformities, effusion, or inflammation, no edema, no skin discoloration, no clubbing  NEURO: alert & oriented x 3 with fluent speech, no focal motor/sensory deficits  PERFORMANCE STATUS: ECOG 1  LABORATORY DATA: Lab Results  Component Value Date   WBC 9.1 08/30/2012   HGB 12.5* 08/30/2012   HCT 37.4* 08/30/2012   MCV 91.2 08/30/2012   PLT 68* 08/30/2012      Chemistry      Component Value Date/Time   NA 123* 08/29/2011 0515   K 4.1 08/29/2011 0515   CL 91* 08/29/2011 0515   CO2 23 08/29/2011 0515   BUN 5* 08/29/2011 0515   CREATININE 0.55 08/29/2011 0515      Component Value Date/Time   CALCIUM 8.8 08/29/2011 0515   ALKPHOS 100 08/19/2011 1211   AST 17 08/19/2011 1211   ALT 15 08/19/2011 1211   BILITOT 0.3 08/19/2011 1211       RADIOGRAPHIC STUDIES: No results found.  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 His platelets count has been waxing and waning over the last several years but the patient has no significant bleeding issues, bruises or ecchymosis. His platelets count today are back to his average over the last few years.  PLAN: I had a lengthy discussion with the patient and his caregiver today about his condition. I recommended for him to continue on observation for now. I don't see a need for any further intervention at this point unless the patient has persistent platelets count less than 50,000 or if he has bleeding issues. I advised the patient to continue his routine followup visit and lab by his primary care physician. I would be happy to see him in the future if needed. He was advised to call me immediately if he has any concerning symptoms related to his low platelets count.  All questions  were answered. The patient knows to call the clinic with any problems, questions or concerns. We can certainly see the patient much sooner if necessary.  Thank you so much for allowing me to participate in the care of Vincent Dunlap. I will continue to follow up the patient with you and assist in his care.  I spent 30 minutes counseling the patient face to face. The total time spent in the appointment was 50 minutes.  Mearl Olver K. 08/30/2012, 2:55 PM

## 2013-02-06 DIAGNOSIS — J019 Acute sinusitis, unspecified: Secondary | ICD-10-CM | POA: Diagnosis not present

## 2013-02-06 DIAGNOSIS — R42 Dizziness and giddiness: Secondary | ICD-10-CM | POA: Diagnosis not present

## 2013-02-20 DIAGNOSIS — Z125 Encounter for screening for malignant neoplasm of prostate: Secondary | ICD-10-CM | POA: Diagnosis not present

## 2013-02-20 DIAGNOSIS — E785 Hyperlipidemia, unspecified: Secondary | ICD-10-CM | POA: Diagnosis not present

## 2013-02-20 DIAGNOSIS — M109 Gout, unspecified: Secondary | ICD-10-CM | POA: Diagnosis not present

## 2013-02-20 DIAGNOSIS — Z0000 Encounter for general adult medical examination without abnormal findings: Secondary | ICD-10-CM | POA: Diagnosis not present

## 2013-02-20 DIAGNOSIS — I10 Essential (primary) hypertension: Secondary | ICD-10-CM | POA: Diagnosis not present

## 2013-02-20 DIAGNOSIS — Z1331 Encounter for screening for depression: Secondary | ICD-10-CM | POA: Diagnosis not present

## 2013-02-20 DIAGNOSIS — M949 Disorder of cartilage, unspecified: Secondary | ICD-10-CM | POA: Diagnosis not present

## 2013-02-27 DIAGNOSIS — M109 Gout, unspecified: Secondary | ICD-10-CM | POA: Diagnosis not present

## 2013-02-27 DIAGNOSIS — I10 Essential (primary) hypertension: Secondary | ICD-10-CM | POA: Diagnosis not present

## 2013-02-27 DIAGNOSIS — F17200 Nicotine dependence, unspecified, uncomplicated: Secondary | ICD-10-CM | POA: Diagnosis not present

## 2013-02-27 DIAGNOSIS — E785 Hyperlipidemia, unspecified: Secondary | ICD-10-CM | POA: Diagnosis not present

## 2013-02-27 DIAGNOSIS — K219 Gastro-esophageal reflux disease without esophagitis: Secondary | ICD-10-CM | POA: Diagnosis not present

## 2013-02-27 DIAGNOSIS — M810 Age-related osteoporosis without current pathological fracture: Secondary | ICD-10-CM | POA: Diagnosis not present

## 2013-02-28 ENCOUNTER — Telehealth: Payer: Self-pay | Admitting: *Deleted

## 2013-02-28 NOTE — Telephone Encounter (Signed)
Lab work from [facility] dated 02/20/13 given to Dr Donnald Garre to review.  SLJ

## 2013-08-29 DIAGNOSIS — M899 Disorder of bone, unspecified: Secondary | ICD-10-CM | POA: Diagnosis not present

## 2013-08-29 DIAGNOSIS — M109 Gout, unspecified: Secondary | ICD-10-CM | POA: Diagnosis not present

## 2013-08-29 DIAGNOSIS — I10 Essential (primary) hypertension: Secondary | ICD-10-CM | POA: Diagnosis not present

## 2013-08-29 DIAGNOSIS — Z23 Encounter for immunization: Secondary | ICD-10-CM | POA: Diagnosis not present

## 2013-09-05 DIAGNOSIS — I10 Essential (primary) hypertension: Secondary | ICD-10-CM | POA: Diagnosis not present

## 2013-09-05 DIAGNOSIS — M109 Gout, unspecified: Secondary | ICD-10-CM | POA: Diagnosis not present

## 2013-09-05 DIAGNOSIS — E785 Hyperlipidemia, unspecified: Secondary | ICD-10-CM | POA: Diagnosis not present

## 2013-09-05 DIAGNOSIS — K219 Gastro-esophageal reflux disease without esophagitis: Secondary | ICD-10-CM | POA: Diagnosis not present

## 2014-02-26 DIAGNOSIS — M949 Disorder of cartilage, unspecified: Secondary | ICD-10-CM | POA: Diagnosis not present

## 2014-02-26 DIAGNOSIS — Z1331 Encounter for screening for depression: Secondary | ICD-10-CM | POA: Diagnosis not present

## 2014-02-26 DIAGNOSIS — M899 Disorder of bone, unspecified: Secondary | ICD-10-CM | POA: Diagnosis not present

## 2014-02-26 DIAGNOSIS — M109 Gout, unspecified: Secondary | ICD-10-CM | POA: Diagnosis not present

## 2014-02-26 DIAGNOSIS — I10 Essential (primary) hypertension: Secondary | ICD-10-CM | POA: Diagnosis not present

## 2014-02-26 DIAGNOSIS — Z0000 Encounter for general adult medical examination without abnormal findings: Secondary | ICD-10-CM | POA: Diagnosis not present

## 2014-02-26 DIAGNOSIS — Z23 Encounter for immunization: Secondary | ICD-10-CM | POA: Diagnosis not present

## 2014-03-05 DIAGNOSIS — I10 Essential (primary) hypertension: Secondary | ICD-10-CM | POA: Diagnosis not present

## 2014-03-05 DIAGNOSIS — K219 Gastro-esophageal reflux disease without esophagitis: Secondary | ICD-10-CM | POA: Diagnosis not present

## 2014-03-05 DIAGNOSIS — F17200 Nicotine dependence, unspecified, uncomplicated: Secondary | ICD-10-CM | POA: Diagnosis not present

## 2014-03-05 DIAGNOSIS — E785 Hyperlipidemia, unspecified: Secondary | ICD-10-CM | POA: Diagnosis not present

## 2014-03-05 DIAGNOSIS — J019 Acute sinusitis, unspecified: Secondary | ICD-10-CM | POA: Diagnosis not present

## 2014-03-05 DIAGNOSIS — M109 Gout, unspecified: Secondary | ICD-10-CM | POA: Diagnosis not present

## 2014-05-20 ENCOUNTER — Ambulatory Visit: Payer: Medicare Other | Attending: Otolaryngology | Admitting: Audiology

## 2014-05-20 DIAGNOSIS — Z9181 History of falling: Secondary | ICD-10-CM | POA: Insufficient documentation

## 2014-05-20 DIAGNOSIS — H93293 Other abnormal auditory perceptions, bilateral: Secondary | ICD-10-CM

## 2014-05-20 DIAGNOSIS — H9319 Tinnitus, unspecified ear: Secondary | ICD-10-CM | POA: Diagnosis not present

## 2014-05-20 DIAGNOSIS — H903 Sensorineural hearing loss, bilateral: Secondary | ICD-10-CM | POA: Diagnosis present

## 2014-05-20 NOTE — Procedures (Signed)
Outpatient Audiology and [facility] [place], [place]  [id number] [phone]  AUDIOLOGICAL  EVALUATION  NAME: Dona Klemann    STATUS: Outpatient DOB:   Jun 10, 1941    DIAGNOSIS: hearing loss, vertigo, tinnitus MRN: [id number]                                                                                     DATE: 05/20/2014    REFERENT: Dr. Ernesto Rutherford ENT   HISTORY Tashawn,  was seen for an audiological evaluation. Antwaun states that he has "a sudden onset of vertigo about  5 months ago with tinnitus".  He states he has "had 5 recent falls" and that he is "afraid that he will break his hip."  EVALUATION: Pure tone air conduction testing showed 35-45 dBHLlow frequency sloping to 80-90dBHL high frequency sensorineural hearing loss bilaterally.  Speech reception thresholds are 55 dBHL on the left and 55 dBHL on the right using recorded spondee word lists. Word recognition was 76% at 90 dBHL on the left at and 90% at 95 dBHL on the right using recorded NU6 word lists, in quiet (contraleral masking was used).  Otoscopic inspection reveals clear ear canals with visible tympanic membranes.  Tympanometry showed (Type A) with normal middle ear pressure and elevated to absent acoustic reflex bilaterally - consistent with the degree of hearing loss.  Tone decay is negative bilaterally at 1000Hz .  Uncomfortable Loudness Testing was performed using speech noise.  Dreshawn reported that noise levels of 110-115 dBHL "hurt" in each ear which indicates recruitment.  Speech-in-Noise testing was performed to determine speech discrimination in the presence of background noise.  Aurelio scored 30 % in the right ear and 12 % in the left ear ear, when noise was presented 5 dB below speech. Kollin is expected to have great difficulty hearing and understanding in minimal background noise.     Tinnitus matching is 87dBHL at 3000Hz  with no tinnitus suppression observed.       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  Mr. Olvin Rohr has excellent word recognition in quiet on the right side that drops to fair on the left side in quiet.  In minimal background noise his word recognition drops to very poor bilaterally.  He appears to have symmetrical middle ear pressure that is within normal limits although his acoustic reflexes are elevated.  He has significant tinnitus that is approximately twice as loud as his hearing threshold at 3000Hz .    Although Mr. Naumann is a candidate for a hearing aid evaluation, possibly with a tinnitus masker, his primary concern is his balance.  Please consider a vestibular and balance assessment at the [facility] which is part of [facility]. Of concern is that he reports that he falls in all directions so that he is at high risk for a fall.  RECOMMENDATIONS: 1.  Please consider referring for vestibular and balance assessment at the [facility] at [address] [place], [place], [place]  [address].  Telephone [phone] and Fax [phone].    2.  Hearing aid evaluation with consideration  of tinnitus masker and directional microphones because of very poor word recognition in minimal background noise.   3.  Closely monitor hearing with a repeat hearing evaluation in 3-6 months to rule out a progressive hearing loss.  Even though the hearing thresholds are symmetrical, word recognition in poorer on the left side.    Deborah L. Heide Spark, Au.D., CCC-A Doctor of Audiology 05/20/2014

## 2014-08-29 ENCOUNTER — Emergency Department (HOSPITAL_COMMUNITY)
Admission: EM | Admit: 2014-08-29 | Discharge: 2014-08-29 | Discharge status: Against Medical Advice | Payer: Medicare Other | Attending: Emergency Medicine | Admitting: Emergency Medicine

## 2014-08-29 ENCOUNTER — Ambulatory Visit (HOSPITAL_COMMUNITY): Admission: RE | Admit: 2014-08-29 | Payer: Medicare Other | Source: Ambulatory Visit

## 2014-08-29 ENCOUNTER — Emergency Department (HOSPITAL_COMMUNITY)
Admission: EM | Admit: 2014-08-29 | Discharge: 2014-08-29 | Disposition: A | Discharge status: Home / Self Care | Payer: Medicare Other | Attending: Emergency Medicine | Admitting: Emergency Medicine

## 2014-08-29 DIAGNOSIS — M859 Disorder of bone density and structure, unspecified: Secondary | ICD-10-CM | POA: Diagnosis not present

## 2014-08-29 DIAGNOSIS — Z7982 Long term (current) use of aspirin: Secondary | ICD-10-CM | POA: Insufficient documentation

## 2014-08-29 DIAGNOSIS — R51 Headache: Secondary | ICD-10-CM | POA: Diagnosis not present

## 2014-08-29 DIAGNOSIS — R42 Dizziness and giddiness: Secondary | ICD-10-CM | POA: Diagnosis not present

## 2014-08-29 DIAGNOSIS — H539 Unspecified visual disturbance: Secondary | ICD-10-CM | POA: Diagnosis not present

## 2014-08-29 DIAGNOSIS — Z87891 Personal history of nicotine dependence: Secondary | ICD-10-CM | POA: Insufficient documentation

## 2014-08-29 DIAGNOSIS — Z79899 Other long term (current) drug therapy: Secondary | ICD-10-CM | POA: Insufficient documentation

## 2014-08-29 DIAGNOSIS — R404 Transient alteration of awareness: Secondary | ICD-10-CM | POA: Diagnosis not present

## 2014-08-29 DIAGNOSIS — I10 Essential (primary) hypertension: Secondary | ICD-10-CM | POA: Diagnosis not present

## 2014-08-29 DIAGNOSIS — R531 Weakness: Secondary | ICD-10-CM | POA: Diagnosis not present

## 2014-08-29 LAB — BASIC METABOLIC PANEL
ANION GAP: 18 — AB (ref 5–15)
BUN: 9 mg/dL (ref 6–23)
CHLORIDE: 98 meq/L (ref 96–112)
CO2: 18 meq/L — AB (ref 19–32)
CREATININE: 0.69 mg/dL (ref 0.50–1.35)
Calcium: 9 mg/dL (ref 8.4–10.5)
GFR calc Af Amer: >90 mL/min (ref >90)
GFR calc non Af Amer: >90 mL/min (ref >90)
GLUCOSE: 90 mg/dL (ref 70–99)
Potassium: 4.1 mEq/L (ref 3.7–5.3)
Sodium: 134 mEq/L — ABNORMAL LOW (ref 137–147)

## 2014-08-29 LAB — CBC WITH DIFFERENTIAL/PLATELET
Basophils Absolute: 0 10*3/uL (ref 0.0–0.1)
Basophils Relative: 0 % (ref 0–1)
Eosinophils Absolute: 0.1 10*3/uL (ref 0.0–0.7)
Eosinophils Relative: 1 % (ref 0–5)
HEMATOCRIT: 34 % — AB (ref 39.0–52.0)
HEMOGLOBIN: 11.3 g/dL — AB (ref 13.0–17.0)
LYMPHS PCT: 17 % (ref 12–46)
Lymphs Abs: 2.7 10*3/uL (ref 0.7–4.0)
MCH: 27.1 pg (ref 26.0–34.0)
MCHC: 33.2 g/dL (ref 30.0–36.0)
MCV: 81.5 fL (ref 78.0–100.0)
MONO ABS: 1.3 10*3/uL — AB (ref 0.1–1.0)
MONOS PCT: 8 % (ref 3–12)
NEUTROS ABS: 11.4 10*3/uL — AB (ref 1.7–7.7)
Neutrophils Relative %: 73 % (ref 43–77)
Platelets: 103 10*3/uL — ABNORMAL LOW (ref 150–400)
RBC: 4.17 MIL/uL — ABNORMAL LOW (ref 4.22–5.81)
RDW: 18.9 % — ABNORMAL HIGH (ref 11.5–15.5)
WBC: 15.5 10*3/uL — AB (ref 4.0–10.5)

## 2014-08-29 LAB — ETHANOL: Alcohol, Ethyl (B): 111 mg/dL — ABNORMAL HIGH (ref 0–11)

## 2014-08-29 MED ORDER — SODIUM CHLORIDE 0.9 % IV BOLUS (SEPSIS): 500.0000 mL | Freq: Once | INTRAVENOUS | Status: DC

## 2014-08-29 NOTE — ED Provider Notes (Signed)
CSN: [id number]     Arrival date & time 08/29/14  1603 History   First MD Initiated Contact with Patient 08/29/14 1738     No chief complaint on file.    (Consider location/radiation/quality/duration/timing/severity/associated sxs/prior Treatment) HPI Comments: 73 year old male with history of falling, past smoker, occasional alcohol use presents with general weakness and dizziness. Patient said he has had dizziness fairly constant worse with head movement for the past year. Patient says he had an MRI through his primary doctor which she was told was negative. Patient denies any stroke history. Patient saw him oral narcotics for arthritis and right shoulder injury in the past. No new medicines. Patient denies GI bleeding or melena. Patient feels his general weakness has worsened however his dizziness is the same the past year. Patient feels diffuse shakiness and cautious gait.  The history is provided by the patient.    No past medical history on file. No past surgical history on file. No family history on file. History  Substance Use Topics  . Smoking status: Former Research scientist (life sciences)  . Smokeless tobacco: Not on file  . Alcohol Use: Not on file    Review of Systems  Constitutional: Negative for fever and chills.  HENT: Negative for congestion.   Eyes: Positive for visual disturbance (mild blurry vision for months.).  Respiratory: Negative for shortness of breath.   Cardiovascular: Negative for chest pain.  Gastrointestinal: Negative for vomiting and abdominal pain.  Genitourinary: Negative for dysuria and flank pain.  Musculoskeletal: Positive for gait problem. Negative for back pain, neck pain and neck stiffness.  Skin: Negative for rash.  Neurological: Positive for dizziness, weakness (Gen. bilateral) and headaches (frontal, gradual onset). Negative for syncope and light-headedness.      Allergies  Chantix  Home Medications   Prior to Admission medications   Medication Sig Start  Date End Date Taking? Authorizing Provider  amitriptyline (ELAVIL) 25 MG tablet Take 25 mg by mouth at bedtime.    Historical Provider, MD  amLODipine-benazepril (LOTREL) 5-20 MG per capsule Take 1 capsule by mouth daily.    Historical Provider, MD  aspirin 81 MG tablet Take 81 mg by mouth daily.    Historical Provider, MD  colchicine 0.6 MG tablet Take 0.6 mg by mouth daily as needed.    Historical Provider, MD  cyclobenzaprine (FLEXERIL) 10 MG tablet Take 10 mg by mouth 3 (three) times daily as needed.    Historical Provider, MD  ergocalciferol (VITAMIN D2) 50000 UNITS capsule Take 50,000 Units by mouth once a week.    Historical Provider, MD  esomeprazole (NEXIUM) 20 MG capsule Take 20 mg by mouth daily before breakfast.    Historical Provider, MD  fish oil-omega-3 fatty acids 1000 MG capsule Take 1 g by mouth 3 (three) times daily.    Historical Provider, MD  Multiple Vitamin (MULTIVITAMIN) tablet Take 1 tablet by mouth daily.    Historical Provider, MD  OxyCODONE HCl (OXYCONTIN PO) Take 30 mg by mouth 2 (two) times daily.    Historical Provider, MD  oxyCODONE-acetaminophen (PERCOCET/ROXICET) 5-325 MG per tablet Take 1 tablet by mouth every 6 (six) hours as needed.    Historical Provider, MD   There were no vitals taken for this visit. Physical Exam  Nursing note and vitals reviewed. Constitutional: He is oriented to person, place, and time. He appears well-developed and well-nourished.  HENT:  Head: Normocephalic and atraumatic.  Mild dry mucous membranes  Eyes: Conjunctivae are normal. Right eye exhibits no discharge. Left eye  exhibits no discharge.  Neck: Normal range of motion. Neck supple. No tracheal deviation present.  Cardiovascular: Normal rate and regular rhythm.   Pulmonary/Chest: Effort normal and breath sounds normal.  Abdominal: Soft. He exhibits no distension. There is no tenderness. There is no guarding.  Musculoskeletal: He exhibits no edema.  Neurological: He is alert  and oriented to person, place, and time. No cranial nerve deficit. Gait abnormal. Coordination normal. GCS eye subscore is 4. GCS verbal subscore is 5. GCS motor subscore is 6.  Patient has 5+ strength with flexion and extension at major joints except the right shoulder difficult to test due to pain/right shoulder injury in the past. Patient has sensation palpation intact upper and lower extremities bilateral equal. Visual fields intact to fingers, pupils equal, neck supple no meningismus. Patient has cautious gait and prefers to lean on something when he walks. Finger nose and heel shin intact.  Skin: Skin is warm. No rash noted.  Psychiatric: He has a normal mood and affect.    ED Course  Procedures (including critical care time) Labs Review Labs Reviewed  BASIC METABOLIC PANEL  CBC WITH DIFFERENTIAL  URINALYSIS, ROUTINE W REFLEX MICROSCOPIC  ETHANOL    Imaging Review No results found.   EKG Interpretation None      MDM   Final diagnoses:  Dizziness   Patient with recurrent weakness generalized and dizziness. Discussed plan for blood work, IV fluids, urinalysis and CT head with dizziness and mild frontal headache. Patient says he has outpatient followup and had an MRI. Due to Epic going down I was unable to locate his records during exam. There was mild delay in his care due to the computer system not working and he left with his aid, the nurse tried to get him to stay however was unsuccessful. Patient had capacity make decisions. I discussed during my exam he need to see neurology outpatient house assuming that everything we checked today was okay. Patient he loped.  Dizziness, general weakness    Mariea Clonts, MD 08/29/14 1753

## 2014-08-29 NOTE — ED Notes (Signed)
Dr.Zavitz notified of patient leaving.
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
## 2014-09-05 DIAGNOSIS — E785 Hyperlipidemia, unspecified: Secondary | ICD-10-CM | POA: Diagnosis not present

## 2014-09-05 DIAGNOSIS — K219 Gastro-esophageal reflux disease without esophagitis: Secondary | ICD-10-CM | POA: Diagnosis not present

## 2014-09-05 DIAGNOSIS — I10 Essential (primary) hypertension: Secondary | ICD-10-CM | POA: Diagnosis not present

## 2014-09-05 DIAGNOSIS — Z23 Encounter for immunization: Secondary | ICD-10-CM | POA: Diagnosis not present

## 2014-09-05 DIAGNOSIS — M109 Gout, unspecified: Secondary | ICD-10-CM | POA: Diagnosis not present

## 2014-10-08 DIAGNOSIS — Z79891 Long term (current) use of opiate analgesic: Secondary | ICD-10-CM | POA: Diagnosis not present

## 2014-10-08 DIAGNOSIS — G894 Chronic pain syndrome: Secondary | ICD-10-CM | POA: Diagnosis not present

## 2014-10-08 DIAGNOSIS — Z79899 Other long term (current) drug therapy: Secondary | ICD-10-CM | POA: Diagnosis not present

## 2014-10-08 DIAGNOSIS — M5386 Other specified dorsopathies, lumbar region: Secondary | ICD-10-CM | POA: Diagnosis not present

## 2014-11-05 DIAGNOSIS — G5792 Unspecified mononeuropathy of left lower limb: Secondary | ICD-10-CM | POA: Diagnosis not present

## 2014-11-05 DIAGNOSIS — M25519 Pain in unspecified shoulder: Secondary | ICD-10-CM | POA: Diagnosis not present

## 2014-11-05 DIAGNOSIS — M25569 Pain in unspecified knee: Secondary | ICD-10-CM | POA: Diagnosis not present

## 2014-11-05 DIAGNOSIS — M79609 Pain in unspecified limb: Secondary | ICD-10-CM | POA: Diagnosis not present

## 2014-11-05 DIAGNOSIS — M5137 Other intervertebral disc degeneration, lumbosacral region: Secondary | ICD-10-CM | POA: Diagnosis not present

## 2014-11-05 DIAGNOSIS — M1990 Unspecified osteoarthritis, unspecified site: Secondary | ICD-10-CM | POA: Diagnosis not present

## 2014-11-05 DIAGNOSIS — M791 Myalgia: Secondary | ICD-10-CM | POA: Diagnosis not present

## 2014-11-05 DIAGNOSIS — G894 Chronic pain syndrome: Secondary | ICD-10-CM | POA: Diagnosis not present

## 2014-11-05 DIAGNOSIS — G5791 Unspecified mononeuropathy of right lower limb: Secondary | ICD-10-CM | POA: Diagnosis not present

## 2014-12-03 DIAGNOSIS — M488X6 Other specified spondylopathies, lumbar region: Secondary | ICD-10-CM | POA: Diagnosis not present

## 2014-12-03 DIAGNOSIS — G894 Chronic pain syndrome: Secondary | ICD-10-CM | POA: Diagnosis not present

## 2014-12-03 DIAGNOSIS — M797 Fibromyalgia: Secondary | ICD-10-CM | POA: Diagnosis not present

## 2014-12-03 DIAGNOSIS — M549 Dorsalgia, unspecified: Secondary | ICD-10-CM | POA: Diagnosis not present

## 2014-12-03 DIAGNOSIS — Z79899 Other long term (current) drug therapy: Secondary | ICD-10-CM | POA: Diagnosis not present

## 2014-12-03 DIAGNOSIS — M1990 Unspecified osteoarthritis, unspecified site: Secondary | ICD-10-CM | POA: Diagnosis not present

## 2014-12-03 DIAGNOSIS — M4697 Unspecified inflammatory spondylopathy, lumbosacral region: Secondary | ICD-10-CM | POA: Diagnosis not present

## 2014-12-03 DIAGNOSIS — G629 Polyneuropathy, unspecified: Secondary | ICD-10-CM | POA: Diagnosis not present

## 2014-12-23 DIAGNOSIS — M4697 Unspecified inflammatory spondylopathy, lumbosacral region: Secondary | ICD-10-CM | POA: Diagnosis not present

## 2014-12-23 DIAGNOSIS — G629 Polyneuropathy, unspecified: Secondary | ICD-10-CM | POA: Diagnosis not present

## 2014-12-23 DIAGNOSIS — M797 Fibromyalgia: Secondary | ICD-10-CM | POA: Diagnosis not present

## 2014-12-23 DIAGNOSIS — M1990 Unspecified osteoarthritis, unspecified site: Secondary | ICD-10-CM | POA: Diagnosis not present

## 2014-12-23 DIAGNOSIS — G894 Chronic pain syndrome: Secondary | ICD-10-CM | POA: Diagnosis not present

## 2014-12-23 DIAGNOSIS — Z79899 Other long term (current) drug therapy: Secondary | ICD-10-CM | POA: Diagnosis not present

## 2014-12-30 DIAGNOSIS — R7309 Other abnormal glucose: Secondary | ICD-10-CM | POA: Diagnosis not present

## 2014-12-30 DIAGNOSIS — W19XXXA Unspecified fall, initial encounter: Secondary | ICD-10-CM | POA: Diagnosis not present

## 2014-12-30 DIAGNOSIS — R42 Dizziness and giddiness: Secondary | ICD-10-CM | POA: Diagnosis not present

## 2014-12-30 DIAGNOSIS — H538 Other visual disturbances: Secondary | ICD-10-CM | POA: Diagnosis not present

## 2014-12-30 DIAGNOSIS — R2689 Other abnormalities of gait and mobility: Secondary | ICD-10-CM | POA: Diagnosis not present

## 2015-01-20 DIAGNOSIS — M549 Dorsalgia, unspecified: Secondary | ICD-10-CM | POA: Diagnosis not present

## 2015-01-20 DIAGNOSIS — Z79899 Other long term (current) drug therapy: Secondary | ICD-10-CM | POA: Diagnosis not present

## 2015-01-20 DIAGNOSIS — M488X6 Other specified spondylopathies, lumbar region: Secondary | ICD-10-CM | POA: Diagnosis not present

## 2015-01-20 DIAGNOSIS — G894 Chronic pain syndrome: Secondary | ICD-10-CM | POA: Diagnosis not present

## 2015-01-20 DIAGNOSIS — M792 Neuralgia and neuritis, unspecified: Secondary | ICD-10-CM | POA: Diagnosis not present

## 2015-02-17 DIAGNOSIS — M792 Neuralgia and neuritis, unspecified: Secondary | ICD-10-CM | POA: Diagnosis not present

## 2015-02-17 DIAGNOSIS — G894 Chronic pain syndrome: Secondary | ICD-10-CM | POA: Diagnosis not present

## 2015-02-17 DIAGNOSIS — M549 Dorsalgia, unspecified: Secondary | ICD-10-CM | POA: Diagnosis not present

## 2015-02-17 DIAGNOSIS — Z79899 Other long term (current) drug therapy: Secondary | ICD-10-CM | POA: Diagnosis not present

## 2015-02-17 DIAGNOSIS — G629 Polyneuropathy, unspecified: Secondary | ICD-10-CM | POA: Diagnosis not present

## 2015-02-27 DIAGNOSIS — M859 Disorder of bone density and structure, unspecified: Secondary | ICD-10-CM | POA: Diagnosis not present

## 2015-02-27 DIAGNOSIS — I10 Essential (primary) hypertension: Secondary | ICD-10-CM | POA: Diagnosis not present

## 2015-02-27 DIAGNOSIS — M109 Gout, unspecified: Secondary | ICD-10-CM | POA: Diagnosis not present

## 2015-03-06 DIAGNOSIS — E785 Hyperlipidemia, unspecified: Secondary | ICD-10-CM | POA: Diagnosis not present

## 2015-03-06 DIAGNOSIS — M109 Gout, unspecified: Secondary | ICD-10-CM | POA: Diagnosis not present

## 2015-03-06 DIAGNOSIS — I10 Essential (primary) hypertension: Secondary | ICD-10-CM | POA: Diagnosis not present

## 2015-03-06 DIAGNOSIS — K219 Gastro-esophageal reflux disease without esophagitis: Secondary | ICD-10-CM | POA: Diagnosis not present

## 2015-03-19 DIAGNOSIS — G894 Chronic pain syndrome: Secondary | ICD-10-CM | POA: Diagnosis not present

## 2015-03-19 DIAGNOSIS — Z79899 Other long term (current) drug therapy: Secondary | ICD-10-CM | POA: Diagnosis not present

## 2015-04-16 DIAGNOSIS — G629 Polyneuropathy, unspecified: Secondary | ICD-10-CM | POA: Diagnosis not present

## 2015-04-16 DIAGNOSIS — Z79899 Other long term (current) drug therapy: Secondary | ICD-10-CM | POA: Diagnosis not present

## 2015-04-16 DIAGNOSIS — G894 Chronic pain syndrome: Secondary | ICD-10-CM | POA: Diagnosis not present

## 2015-04-16 DIAGNOSIS — M4697 Unspecified inflammatory spondylopathy, lumbosacral region: Secondary | ICD-10-CM | POA: Diagnosis not present

## 2015-04-16 DIAGNOSIS — M797 Fibromyalgia: Secondary | ICD-10-CM | POA: Diagnosis not present

## 2015-04-16 DIAGNOSIS — M1990 Unspecified osteoarthritis, unspecified site: Secondary | ICD-10-CM | POA: Diagnosis not present

## 2015-06-11 DIAGNOSIS — Z79899 Other long term (current) drug therapy: Secondary | ICD-10-CM | POA: Diagnosis not present

## 2015-06-11 DIAGNOSIS — G629 Polyneuropathy, unspecified: Secondary | ICD-10-CM | POA: Diagnosis not present

## 2015-06-11 DIAGNOSIS — M4697 Unspecified inflammatory spondylopathy, lumbosacral region: Secondary | ICD-10-CM | POA: Diagnosis not present

## 2015-06-11 DIAGNOSIS — G894 Chronic pain syndrome: Secondary | ICD-10-CM | POA: Diagnosis not present

## 2015-06-11 DIAGNOSIS — M1990 Unspecified osteoarthritis, unspecified site: Secondary | ICD-10-CM | POA: Diagnosis not present

## 2015-06-11 DIAGNOSIS — M797 Fibromyalgia: Secondary | ICD-10-CM | POA: Diagnosis not present

## 2015-07-09 DIAGNOSIS — M4697 Unspecified inflammatory spondylopathy, lumbosacral region: Secondary | ICD-10-CM | POA: Diagnosis not present

## 2015-07-09 DIAGNOSIS — G629 Polyneuropathy, unspecified: Secondary | ICD-10-CM | POA: Diagnosis not present

## 2015-07-09 DIAGNOSIS — M797 Fibromyalgia: Secondary | ICD-10-CM | POA: Diagnosis not present

## 2015-07-09 DIAGNOSIS — M1990 Unspecified osteoarthritis, unspecified site: Secondary | ICD-10-CM | POA: Diagnosis not present

## 2015-07-09 DIAGNOSIS — G894 Chronic pain syndrome: Secondary | ICD-10-CM | POA: Diagnosis not present

## 2015-07-09 DIAGNOSIS — Z79899 Other long term (current) drug therapy: Secondary | ICD-10-CM | POA: Diagnosis not present

## 2015-09-01 DIAGNOSIS — G894 Chronic pain syndrome: Secondary | ICD-10-CM | POA: Diagnosis not present

## 2015-09-01 DIAGNOSIS — Z79899 Other long term (current) drug therapy: Secondary | ICD-10-CM | POA: Diagnosis not present

## 2015-09-01 DIAGNOSIS — M545 Low back pain: Secondary | ICD-10-CM | POA: Diagnosis not present

## 2015-09-01 DIAGNOSIS — M792 Neuralgia and neuritis, unspecified: Secondary | ICD-10-CM | POA: Diagnosis not present

## 2015-09-29 DIAGNOSIS — Z79899 Other long term (current) drug therapy: Secondary | ICD-10-CM | POA: Diagnosis not present

## 2015-09-29 DIAGNOSIS — G894 Chronic pain syndrome: Secondary | ICD-10-CM | POA: Diagnosis not present

## 2015-09-29 DIAGNOSIS — M47817 Spondylosis without myelopathy or radiculopathy, lumbosacral region: Secondary | ICD-10-CM | POA: Diagnosis not present

## 2015-10-10 ENCOUNTER — Ambulatory Visit
Admission: RE | Admit: 2015-10-10 | Discharge: 2015-10-10 | Disposition: A | Discharge status: Home / Self Care | Payer: Medicare Other | Source: Ambulatory Visit | Attending: Anesthesiology | Admitting: Anesthesiology

## 2015-10-10 ENCOUNTER — Other Ambulatory Visit: Payer: Self-pay | Admitting: Anesthesiology

## 2015-10-10 DIAGNOSIS — M549 Dorsalgia, unspecified: Secondary | ICD-10-CM

## 2015-10-10 DIAGNOSIS — M5136 Other intervertebral disc degeneration, lumbar region: Secondary | ICD-10-CM | POA: Diagnosis not present

## 2015-10-15 DIAGNOSIS — M47817 Spondylosis without myelopathy or radiculopathy, lumbosacral region: Secondary | ICD-10-CM | POA: Diagnosis not present

## 2015-10-21 DIAGNOSIS — Z0000 Encounter for general adult medical examination without abnormal findings: Secondary | ICD-10-CM | POA: Diagnosis not present

## 2015-10-21 DIAGNOSIS — F329 Major depressive disorder, single episode, unspecified: Secondary | ICD-10-CM | POA: Diagnosis not present

## 2015-10-21 DIAGNOSIS — Z23 Encounter for immunization: Secondary | ICD-10-CM | POA: Diagnosis not present

## 2015-10-21 DIAGNOSIS — E559 Vitamin D deficiency, unspecified: Secondary | ICD-10-CM | POA: Diagnosis not present

## 2015-10-21 DIAGNOSIS — E785 Hyperlipidemia, unspecified: Secondary | ICD-10-CM | POA: Diagnosis not present

## 2015-10-21 DIAGNOSIS — M109 Gout, unspecified: Secondary | ICD-10-CM | POA: Diagnosis not present

## 2015-10-21 DIAGNOSIS — I10 Essential (primary) hypertension: Secondary | ICD-10-CM | POA: Diagnosis not present

## 2015-10-27 DIAGNOSIS — G894 Chronic pain syndrome: Secondary | ICD-10-CM | POA: Diagnosis not present

## 2015-10-27 DIAGNOSIS — Z79899 Other long term (current) drug therapy: Secondary | ICD-10-CM | POA: Diagnosis not present

## 2015-10-27 DIAGNOSIS — M47817 Spondylosis without myelopathy or radiculopathy, lumbosacral region: Secondary | ICD-10-CM | POA: Diagnosis not present

## 2015-10-27 DIAGNOSIS — M25519 Pain in unspecified shoulder: Secondary | ICD-10-CM | POA: Diagnosis not present

## 2015-10-27 DIAGNOSIS — M549 Dorsalgia, unspecified: Secondary | ICD-10-CM | POA: Diagnosis not present

## 2015-10-27 DIAGNOSIS — M1288 Other specific arthropathies, not elsewhere classified, other specified site: Secondary | ICD-10-CM | POA: Diagnosis not present

## 2015-11-09 DIAGNOSIS — I255 Ischemic cardiomyopathy: Secondary | ICD-10-CM

## 2015-11-09 HISTORY — DX: Ischemic cardiomyopathy: I25.5

## 2015-11-24 DIAGNOSIS — G894 Chronic pain syndrome: Secondary | ICD-10-CM | POA: Diagnosis not present

## 2015-11-24 DIAGNOSIS — Z79899 Other long term (current) drug therapy: Secondary | ICD-10-CM | POA: Diagnosis not present

## 2015-11-24 DIAGNOSIS — M25519 Pain in unspecified shoulder: Secondary | ICD-10-CM | POA: Diagnosis not present

## 2015-11-24 DIAGNOSIS — M47817 Spondylosis without myelopathy or radiculopathy, lumbosacral region: Secondary | ICD-10-CM | POA: Diagnosis not present

## 2015-11-25 ENCOUNTER — Other Ambulatory Visit: Payer: Self-pay | Admitting: Pain Medicine

## 2015-11-25 DIAGNOSIS — M545 Low back pain: Secondary | ICD-10-CM

## 2015-12-08 ENCOUNTER — Other Ambulatory Visit: Payer: Medicare Other

## 2015-12-09 ENCOUNTER — Inpatient Hospital Stay
Admission: RE | Admit: 2015-12-09 | Discharge: 2015-12-09 | Disposition: A | Discharge status: Home / Self Care | Payer: Medicare Other | Source: Ambulatory Visit | Attending: Pain Medicine | Admitting: Pain Medicine

## 2015-12-22 DIAGNOSIS — M545 Low back pain: Secondary | ICD-10-CM | POA: Diagnosis not present

## 2015-12-22 DIAGNOSIS — Z79899 Other long term (current) drug therapy: Secondary | ICD-10-CM | POA: Diagnosis not present

## 2015-12-22 DIAGNOSIS — M549 Dorsalgia, unspecified: Secondary | ICD-10-CM | POA: Diagnosis not present

## 2015-12-22 DIAGNOSIS — M47817 Spondylosis without myelopathy or radiculopathy, lumbosacral region: Secondary | ICD-10-CM | POA: Diagnosis not present

## 2015-12-22 DIAGNOSIS — M1288 Other specific arthropathies, not elsewhere classified, other specified site: Secondary | ICD-10-CM | POA: Diagnosis not present

## 2015-12-22 DIAGNOSIS — G894 Chronic pain syndrome: Secondary | ICD-10-CM | POA: Diagnosis not present

## 2015-12-31 DIAGNOSIS — M25519 Pain in unspecified shoulder: Secondary | ICD-10-CM | POA: Diagnosis not present

## 2015-12-31 DIAGNOSIS — M1990 Unspecified osteoarthritis, unspecified site: Secondary | ICD-10-CM | POA: Diagnosis not present

## 2016-01-05 DIAGNOSIS — Z79899 Other long term (current) drug therapy: Secondary | ICD-10-CM | POA: Diagnosis not present

## 2016-01-05 DIAGNOSIS — M25519 Pain in unspecified shoulder: Secondary | ICD-10-CM | POA: Diagnosis not present

## 2016-01-05 DIAGNOSIS — M1288 Other specific arthropathies, not elsewhere classified, other specified site: Secondary | ICD-10-CM | POA: Diagnosis not present

## 2016-01-05 DIAGNOSIS — G894 Chronic pain syndrome: Secondary | ICD-10-CM | POA: Diagnosis not present

## 2016-01-05 DIAGNOSIS — M47817 Spondylosis without myelopathy or radiculopathy, lumbosacral region: Secondary | ICD-10-CM | POA: Diagnosis not present

## 2016-01-08 DIAGNOSIS — M545 Low back pain: Secondary | ICD-10-CM | POA: Diagnosis not present

## 2016-01-08 DIAGNOSIS — M79609 Pain in unspecified limb: Secondary | ICD-10-CM | POA: Diagnosis not present

## 2016-01-20 DIAGNOSIS — Z79899 Other long term (current) drug therapy: Secondary | ICD-10-CM | POA: Diagnosis not present

## 2016-01-20 DIAGNOSIS — G894 Chronic pain syndrome: Secondary | ICD-10-CM | POA: Diagnosis not present

## 2016-01-20 DIAGNOSIS — M545 Low back pain: Secondary | ICD-10-CM | POA: Diagnosis not present

## 2016-01-20 DIAGNOSIS — M47817 Spondylosis without myelopathy or radiculopathy, lumbosacral region: Secondary | ICD-10-CM | POA: Diagnosis not present

## 2016-03-11 DIAGNOSIS — Z79899 Other long term (current) drug therapy: Secondary | ICD-10-CM | POA: Diagnosis not present

## 2016-03-11 DIAGNOSIS — G894 Chronic pain syndrome: Secondary | ICD-10-CM | POA: Diagnosis not present

## 2016-03-11 DIAGNOSIS — M47817 Spondylosis without myelopathy or radiculopathy, lumbosacral region: Secondary | ICD-10-CM | POA: Diagnosis not present

## 2016-03-11 DIAGNOSIS — M545 Low back pain: Secondary | ICD-10-CM | POA: Diagnosis not present

## 2016-03-11 DIAGNOSIS — Z79891 Long term (current) use of opiate analgesic: Secondary | ICD-10-CM | POA: Diagnosis not present

## 2016-03-11 DIAGNOSIS — M549 Dorsalgia, unspecified: Secondary | ICD-10-CM | POA: Diagnosis not present

## 2016-03-11 DIAGNOSIS — M1288 Other specific arthropathies, not elsewhere classified, other specified site: Secondary | ICD-10-CM | POA: Diagnosis not present

## 2016-04-01 DIAGNOSIS — M1288 Other specific arthropathies, not elsewhere classified, other specified site: Secondary | ICD-10-CM | POA: Diagnosis not present

## 2016-04-01 DIAGNOSIS — G894 Chronic pain syndrome: Secondary | ICD-10-CM | POA: Diagnosis not present

## 2016-04-01 DIAGNOSIS — Z79899 Other long term (current) drug therapy: Secondary | ICD-10-CM | POA: Diagnosis not present

## 2016-04-01 DIAGNOSIS — Z79891 Long term (current) use of opiate analgesic: Secondary | ICD-10-CM | POA: Diagnosis not present

## 2016-04-01 DIAGNOSIS — M25519 Pain in unspecified shoulder: Secondary | ICD-10-CM | POA: Diagnosis not present

## 2016-04-01 DIAGNOSIS — M47817 Spondylosis without myelopathy or radiculopathy, lumbosacral region: Secondary | ICD-10-CM | POA: Diagnosis not present

## 2016-05-04 DIAGNOSIS — M859 Disorder of bone density and structure, unspecified: Secondary | ICD-10-CM | POA: Diagnosis not present

## 2016-05-04 DIAGNOSIS — M8580 Other specified disorders of bone density and structure, unspecified site: Secondary | ICD-10-CM | POA: Diagnosis not present

## 2016-05-04 DIAGNOSIS — D693 Immune thrombocytopenic purpura: Secondary | ICD-10-CM | POA: Diagnosis not present

## 2016-05-04 DIAGNOSIS — I129 Hypertensive chronic kidney disease with stage 1 through stage 4 chronic kidney disease, or unspecified chronic kidney disease: Secondary | ICD-10-CM | POA: Diagnosis not present

## 2016-05-04 DIAGNOSIS — N182 Chronic kidney disease, stage 2 (mild): Secondary | ICD-10-CM | POA: Diagnosis not present

## 2016-05-04 DIAGNOSIS — M109 Gout, unspecified: Secondary | ICD-10-CM | POA: Diagnosis not present

## 2016-05-19 DIAGNOSIS — M25519 Pain in unspecified shoulder: Secondary | ICD-10-CM | POA: Diagnosis not present

## 2016-05-19 DIAGNOSIS — M1990 Unspecified osteoarthritis, unspecified site: Secondary | ICD-10-CM | POA: Diagnosis not present

## 2016-06-07 DIAGNOSIS — M47817 Spondylosis without myelopathy or radiculopathy, lumbosacral region: Secondary | ICD-10-CM | POA: Diagnosis not present

## 2016-06-07 DIAGNOSIS — Z79891 Long term (current) use of opiate analgesic: Secondary | ICD-10-CM | POA: Diagnosis not present

## 2016-06-07 DIAGNOSIS — Z79899 Other long term (current) drug therapy: Secondary | ICD-10-CM | POA: Diagnosis not present

## 2016-06-07 DIAGNOSIS — G894 Chronic pain syndrome: Secondary | ICD-10-CM | POA: Diagnosis not present

## 2016-07-05 DIAGNOSIS — Z79899 Other long term (current) drug therapy: Secondary | ICD-10-CM | POA: Diagnosis not present

## 2016-07-05 DIAGNOSIS — G894 Chronic pain syndrome: Secondary | ICD-10-CM | POA: Diagnosis not present

## 2016-07-05 DIAGNOSIS — Z79891 Long term (current) use of opiate analgesic: Secondary | ICD-10-CM | POA: Diagnosis not present

## 2016-07-05 DIAGNOSIS — G8929 Other chronic pain: Secondary | ICD-10-CM | POA: Diagnosis not present

## 2016-07-05 DIAGNOSIS — M25519 Pain in unspecified shoulder: Secondary | ICD-10-CM | POA: Diagnosis not present

## 2016-07-09 DIAGNOSIS — I2510 Atherosclerotic heart disease of native coronary artery without angina pectoris: Secondary | ICD-10-CM

## 2016-07-09 HISTORY — DX: Atherosclerotic heart disease of native coronary artery without angina pectoris: I25.10

## 2016-07-17 ENCOUNTER — Inpatient Hospital Stay (HOSPITAL_COMMUNITY): Payer: Medicare Other

## 2016-07-17 ENCOUNTER — Encounter (HOSPITAL_COMMUNITY): Payer: Self-pay | Admitting: Emergency Medicine

## 2016-07-17 ENCOUNTER — Inpatient Hospital Stay (HOSPITAL_COMMUNITY)
Admission: EM | Admit: 2016-07-17 | Discharge: 2016-07-25 | DRG: 280 | Disposition: A | Discharge status: Skilled Nursing Facility | Payer: Medicare Other | Attending: Internal Medicine | Admitting: Internal Medicine

## 2016-07-17 ENCOUNTER — Emergency Department (HOSPITAL_COMMUNITY): Payer: Medicare Other

## 2016-07-17 DIAGNOSIS — E872 Acidosis, unspecified: Secondary | ICD-10-CM

## 2016-07-17 DIAGNOSIS — J189 Pneumonia, unspecified organism: Secondary | ICD-10-CM

## 2016-07-17 DIAGNOSIS — I110 Hypertensive heart disease with heart failure: Secondary | ICD-10-CM | POA: Diagnosis not present

## 2016-07-17 DIAGNOSIS — R0600 Dyspnea, unspecified: Secondary | ICD-10-CM

## 2016-07-17 DIAGNOSIS — Z23 Encounter for immunization: Secondary | ICD-10-CM | POA: Diagnosis not present

## 2016-07-17 DIAGNOSIS — Z779 Other contact with and (suspected) exposures hazardous to health: Secondary | ICD-10-CM

## 2016-07-17 DIAGNOSIS — R5381 Other malaise: Secondary | ICD-10-CM | POA: Diagnosis present

## 2016-07-17 DIAGNOSIS — F1010 Alcohol abuse, uncomplicated: Secondary | ICD-10-CM | POA: Diagnosis present

## 2016-07-17 DIAGNOSIS — I509 Heart failure, unspecified: Secondary | ICD-10-CM | POA: Insufficient documentation

## 2016-07-17 DIAGNOSIS — I5022 Chronic systolic (congestive) heart failure: Secondary | ICD-10-CM

## 2016-07-17 DIAGNOSIS — Z79899 Other long term (current) drug therapy: Secondary | ICD-10-CM | POA: Diagnosis not present

## 2016-07-17 DIAGNOSIS — I5043 Acute on chronic combined systolic (congestive) and diastolic (congestive) heart failure: Secondary | ICD-10-CM | POA: Diagnosis present

## 2016-07-17 DIAGNOSIS — L89610 Pressure ulcer of right heel, unstageable: Secondary | ICD-10-CM | POA: Diagnosis present

## 2016-07-17 DIAGNOSIS — R0602 Shortness of breath: Secondary | ICD-10-CM | POA: Diagnosis not present

## 2016-07-17 DIAGNOSIS — I10 Essential (primary) hypertension: Secondary | ICD-10-CM | POA: Diagnosis not present

## 2016-07-17 DIAGNOSIS — I2584 Coronary atherosclerosis due to calcified coronary lesion: Secondary | ICD-10-CM | POA: Diagnosis present

## 2016-07-17 DIAGNOSIS — L89620 Pressure ulcer of left heel, unstageable: Secondary | ICD-10-CM | POA: Diagnosis present

## 2016-07-17 DIAGNOSIS — J441 Chronic obstructive pulmonary disease with (acute) exacerbation: Secondary | ICD-10-CM | POA: Diagnosis not present

## 2016-07-17 DIAGNOSIS — F17210 Nicotine dependence, cigarettes, uncomplicated: Secondary | ICD-10-CM | POA: Diagnosis present

## 2016-07-17 DIAGNOSIS — I2510 Atherosclerotic heart disease of native coronary artery without angina pectoris: Secondary | ICD-10-CM | POA: Diagnosis not present

## 2016-07-17 DIAGNOSIS — R000 Tachycardia, unspecified: Secondary | ICD-10-CM

## 2016-07-17 DIAGNOSIS — I2582 Chronic total occlusion of coronary artery: Secondary | ICD-10-CM | POA: Diagnosis present

## 2016-07-17 DIAGNOSIS — I5021 Acute systolic (congestive) heart failure: Secondary | ICD-10-CM | POA: Diagnosis not present

## 2016-07-17 DIAGNOSIS — L8990 Pressure ulcer of unspecified site, unspecified stage: Secondary | ICD-10-CM | POA: Insufficient documentation

## 2016-07-17 DIAGNOSIS — I249 Acute ischemic heart disease, unspecified: Secondary | ICD-10-CM | POA: Diagnosis not present

## 2016-07-17 DIAGNOSIS — Z79891 Long term (current) use of opiate analgesic: Secondary | ICD-10-CM

## 2016-07-17 DIAGNOSIS — G894 Chronic pain syndrome: Secondary | ICD-10-CM | POA: Diagnosis not present

## 2016-07-17 DIAGNOSIS — J449 Chronic obstructive pulmonary disease, unspecified: Secondary | ICD-10-CM

## 2016-07-17 DIAGNOSIS — I959 Hypotension, unspecified: Secondary | ICD-10-CM | POA: Diagnosis not present

## 2016-07-17 DIAGNOSIS — E785 Hyperlipidemia, unspecified: Secondary | ICD-10-CM | POA: Diagnosis present

## 2016-07-17 DIAGNOSIS — I252 Old myocardial infarction: Secondary | ICD-10-CM | POA: Diagnosis not present

## 2016-07-17 DIAGNOSIS — I214 Non-ST elevation (NSTEMI) myocardial infarction: Secondary | ICD-10-CM | POA: Diagnosis not present

## 2016-07-17 DIAGNOSIS — I255 Ischemic cardiomyopathy: Secondary | ICD-10-CM | POA: Diagnosis not present

## 2016-07-17 DIAGNOSIS — I25110 Atherosclerotic heart disease of native coronary artery with unstable angina pectoris: Secondary | ICD-10-CM | POA: Diagnosis not present

## 2016-07-17 DIAGNOSIS — I272 Other secondary pulmonary hypertension: Secondary | ICD-10-CM | POA: Diagnosis present

## 2016-07-17 DIAGNOSIS — Z7401 Bed confinement status: Secondary | ICD-10-CM

## 2016-07-17 DIAGNOSIS — D693 Immune thrombocytopenic purpura: Secondary | ICD-10-CM

## 2016-07-17 DIAGNOSIS — D696 Thrombocytopenia, unspecified: Secondary | ICD-10-CM | POA: Diagnosis present

## 2016-07-17 DIAGNOSIS — R42 Dizziness and giddiness: Secondary | ICD-10-CM

## 2016-07-17 DIAGNOSIS — R7989 Other specified abnormal findings of blood chemistry: Secondary | ICD-10-CM

## 2016-07-17 DIAGNOSIS — J9601 Acute respiratory failure with hypoxia: Secondary | ICD-10-CM | POA: Diagnosis present

## 2016-07-17 HISTORY — DX: Essential (primary) hypertension: I10

## 2016-07-17 HISTORY — DX: Tobacco use: Z72.0

## 2016-07-17 LAB — COMPREHENSIVE METABOLIC PANEL
ALBUMIN: 3.4 g/dL — AB (ref 3.5–5.0)
ALT: 19 U/L (ref 17–63)
AST: 38 U/L (ref 15–41)
Alkaline Phosphatase: 79 U/L (ref 38–126)
Anion gap: 15 (ref 5–15)
BILIRUBIN TOTAL: 1 mg/dL (ref 0.3–1.2)
BUN: 9 mg/dL (ref 6–20)
CHLORIDE: 98 mmol/L — AB (ref 101–111)
CO2: 24 mmol/L (ref 22–32)
Calcium: 9 mg/dL (ref 8.9–10.3)
Creatinine, Ser: 0.69 mg/dL (ref 0.61–1.24)
GFR calc Af Amer: >60 mL/min (ref >60)
GFR calc non Af Amer: >60 mL/min (ref >60)
GLUCOSE: 156 mg/dL — AB (ref 65–99)
POTASSIUM: 3.5 mmol/L (ref 3.5–5.1)
SODIUM: 137 mmol/L (ref 135–145)
TOTAL PROTEIN: 6.3 g/dL — AB (ref 6.5–8.1)

## 2016-07-17 LAB — LIPID PANEL
CHOL/HDL RATIO: 2.3 ratio
Cholesterol: 151 mg/dL (ref 0–200)
HDL: 67 mg/dL (ref >40)
LDL Cholesterol: 77 mg/dL (ref 0–99)
Triglycerides: 33 mg/dL (ref <150)
VLDL: 7 mg/dL (ref 0–40)

## 2016-07-17 LAB — URINALYSIS, ROUTINE W REFLEX MICROSCOPIC
Bilirubin Urine: NEGATIVE
GLUCOSE, UA: NEGATIVE mg/dL
HGB URINE DIPSTICK: NEGATIVE
Ketones, ur: 15 mg/dL — AB
Leukocytes, UA: NEGATIVE
Nitrite: NEGATIVE
Protein, ur: NEGATIVE mg/dL
SPECIFIC GRAVITY, URINE: 1.016 (ref 1.005–1.030)
pH: 5.5 (ref 5.0–8.0)

## 2016-07-17 LAB — CBC WITH DIFFERENTIAL/PLATELET
Basophils Absolute: 0 10*3/uL (ref 0.0–0.1)
Basophils Relative: 0 %
EOS PCT: 0 %
Eosinophils Absolute: 0.1 10*3/uL (ref 0.0–0.7)
HCT: 36.8 % — ABNORMAL LOW (ref 39.0–52.0)
Hemoglobin: 12.1 g/dL — ABNORMAL LOW (ref 13.0–17.0)
LYMPHS ABS: 1.9 10*3/uL (ref 0.7–4.0)
LYMPHS PCT: 13 %
MCH: 29.6 pg (ref 26.0–34.0)
MCHC: 32.9 g/dL (ref 30.0–36.0)
MCV: 90 fL (ref 78.0–100.0)
MONO ABS: 1 10*3/uL (ref 0.1–1.0)
Monocytes Relative: 7 %
Neutro Abs: 11.8 10*3/uL — ABNORMAL HIGH (ref 1.7–7.7)
Neutrophils Relative %: 80 %
PLATELETS: 180 10*3/uL (ref 150–400)
RBC: 4.09 MIL/uL — ABNORMAL LOW (ref 4.22–5.81)
RDW: 17.1 % — AB (ref 11.5–15.5)
WBC: 14.8 10*3/uL — ABNORMAL HIGH (ref 4.0–10.5)

## 2016-07-17 LAB — TSH: TSH: 0.618 u[IU]/mL (ref 0.350–4.500)

## 2016-07-17 LAB — ECHOCARDIOGRAM COMPLETE
HEIGHTINCHES: 71 in
WEIGHTICAEL: 2896 [oz_av]

## 2016-07-17 LAB — TROPONIN I
TROPONIN I: 0.5 ng/mL — AB (ref <0.03)
Troponin I: 0.2 ng/mL (ref <0.03)
Troponin I: 0.35 ng/mL (ref <0.03)

## 2016-07-17 LAB — LACTIC ACID, PLASMA
LACTIC ACID, VENOUS: 1.6 mmol/L (ref 0.5–1.9)
LACTIC ACID, VENOUS: 1.7 mmol/L (ref 0.5–1.9)

## 2016-07-17 LAB — I-STAT TROPONIN, ED: Troponin i, poc: 0.07 ng/mL (ref 0.00–0.08)

## 2016-07-17 LAB — I-STAT CG4 LACTIC ACID, ED: Lactic Acid, Venous: 3.49 mmol/L (ref 0.5–1.9)

## 2016-07-17 LAB — MRSA PCR SCREENING: MRSA BY PCR: NEGATIVE

## 2016-07-17 LAB — BRAIN NATRIURETIC PEPTIDE: B Natriuretic Peptide: 1914.6 pg/mL — ABNORMAL HIGH (ref 0.0–100.0)

## 2016-07-17 LAB — HEPARIN LEVEL (UNFRACTIONATED): HEPARIN UNFRACTIONATED: 0.31 [IU]/mL (ref 0.30–0.70)

## 2016-07-17 MED ORDER — CARVEDILOL 3.125 MG PO TABS
3.1250 mg | ORAL_TABLET | Freq: Two times a day (BID) | ORAL | Status: DC
  Administered 2016-07-17 – 2016-07-21 (×7): 3.125 mg via ORAL
  Filled 2016-07-17 (×7): qty 1

## 2016-07-17 MED ORDER — SODIUM CHLORIDE 0.9 % IV BOLUS (SEPSIS)
1000.0000 mL | Freq: Once | INTRAVENOUS | Status: AC
  Administered 2016-07-17: 1000 mL via INTRAVENOUS

## 2016-07-17 MED ORDER — HEPARIN (PORCINE) IN NACL 100-0.45 UNIT/ML-% IJ SOLN
1200.0000 [IU]/h | INTRAMUSCULAR | Status: DC
  Administered 2016-07-17 – 2016-07-18 (×2): 1150 [IU]/h via INTRAVENOUS
  Administered 2016-07-19: 1200 [IU]/h via INTRAVENOUS
  Filled 2016-07-17 (×3): qty 250

## 2016-07-17 MED ORDER — AMLODIPINE BESYLATE 5 MG PO TABS
5.0000 mg | ORAL_TABLET | Freq: Every day | ORAL | Status: DC
  Administered 2016-07-17: 5 mg via ORAL
  Filled 2016-07-17: qty 1

## 2016-07-17 MED ORDER — SODIUM CHLORIDE 0.9 % IV SOLN: 250.0000 mL | INTRAVENOUS | Status: DC | PRN

## 2016-07-17 MED ORDER — ORAL CARE MOUTH RINSE
15.0000 mL | Freq: Two times a day (BID) | OROMUCOSAL | Status: DC
  Administered 2016-07-17 – 2016-07-25 (×12): 15 mL via OROMUCOSAL

## 2016-07-17 MED ORDER — ONDANSETRON HCL 4 MG/2ML IJ SOLN: 4.0000 mg | Freq: Four times a day (QID) | INTRAMUSCULAR | Status: DC | PRN

## 2016-07-17 MED ORDER — METHYLPREDNISOLONE SODIUM SUCC 125 MG IJ SOLR
125.0000 mg | Freq: Once | INTRAMUSCULAR | Status: AC
  Administered 2016-07-17: 125 mg via INTRAVENOUS
  Filled 2016-07-17: qty 2

## 2016-07-17 MED ORDER — SODIUM CHLORIDE 0.9% FLUSH: 3.0000 mL | INTRAVENOUS | Status: DC | PRN

## 2016-07-17 MED ORDER — FUROSEMIDE 10 MG/ML IJ SOLN
60.0000 mg | Freq: Once | INTRAMUSCULAR | Status: AC
  Administered 2016-07-17: 60 mg via INTRAVENOUS
  Filled 2016-07-17: qty 6

## 2016-07-17 MED ORDER — BENAZEPRIL HCL 20 MG PO TABS
20.0000 mg | ORAL_TABLET | Freq: Every day | ORAL | Status: DC
  Administered 2016-07-17: 20 mg via ORAL
  Filled 2016-07-17: qty 1

## 2016-07-17 MED ORDER — HEPARIN BOLUS VIA INFUSION
4000.0000 [IU] | Freq: Once | INTRAVENOUS | Status: AC
  Administered 2016-07-17: 4000 [IU] via INTRAVENOUS
  Filled 2016-07-17: qty 4000

## 2016-07-17 MED ORDER — ACETAMINOPHEN 325 MG PO TABS
650.0000 mg | ORAL_TABLET | ORAL | Status: DC | PRN
  Administered 2016-07-17 – 2016-07-24 (×3): 650 mg via ORAL
  Filled 2016-07-17 (×3): qty 2

## 2016-07-17 MED ORDER — PIPERACILLIN-TAZOBACTAM 3.375 G IVPB 30 MIN
3.3750 g | Freq: Once | INTRAVENOUS | Status: AC
  Administered 2016-07-17: 3.375 g via INTRAVENOUS
  Filled 2016-07-17: qty 50

## 2016-07-17 MED ORDER — FUROSEMIDE 10 MG/ML IJ SOLN
60.0000 mg | Freq: Two times a day (BID) | INTRAMUSCULAR | Status: DC
  Administered 2016-07-17 – 2016-07-18 (×2): 60 mg via INTRAVENOUS
  Filled 2016-07-17 (×4): qty 6

## 2016-07-17 MED ORDER — FUROSEMIDE 10 MG/ML IJ SOLN: 40.0000 mg | Freq: Once | INTRAMUSCULAR | Status: DC

## 2016-07-17 MED ORDER — VANCOMYCIN HCL IN DEXTROSE 1-5 GM/200ML-% IV SOLN
1000.0000 mg | Freq: Once | INTRAVENOUS | Status: AC
  Administered 2016-07-17: 1000 mg via INTRAVENOUS
  Filled 2016-07-17: qty 200

## 2016-07-17 MED ORDER — LEVALBUTEROL HCL 0.63 MG/3ML IN NEBU
0.6300 mg | INHALATION_SOLUTION | Freq: Once | RESPIRATORY_TRACT | Status: AC
  Administered 2016-07-17: 0.63 mg via RESPIRATORY_TRACT
  Filled 2016-07-17: qty 3

## 2016-07-17 MED ORDER — POTASSIUM CHLORIDE CRYS ER 20 MEQ PO TBCR
40.0000 meq | EXTENDED_RELEASE_TABLET | Freq: Once | ORAL | Status: AC
  Administered 2016-07-17: 40 meq via ORAL
  Filled 2016-07-17: qty 2

## 2016-07-17 MED ORDER — ASPIRIN 325 MG PO TABS
325.0000 mg | ORAL_TABLET | Freq: Every day | ORAL | Status: DC
Start: 2016-07-17 — End: 2016-07-19
  Administered 2016-07-17 – 2016-07-18 (×2): 325 mg via ORAL
  Filled 2016-07-17 (×2): qty 1

## 2016-07-17 MED ORDER — SODIUM CHLORIDE 0.9% FLUSH
3.0000 mL | Freq: Two times a day (BID) | INTRAVENOUS | Status: DC
  Administered 2016-07-17 – 2016-07-19 (×5): 3 mL via INTRAVENOUS

## 2016-07-17 MED ORDER — OXYCODONE HCL ER 10 MG PO T12A
20.0000 mg | EXTENDED_RELEASE_TABLET | Freq: Two times a day (BID) | ORAL | Status: DC
  Administered 2016-07-17 – 2016-07-18 (×3): 20 mg via ORAL
  Filled 2016-07-17 (×3): qty 2

## 2016-07-17 NOTE — H&P (Signed)
History and Physical    Vincent Dunlap TGG:[id number] DOB: Aug 23, 1941 DOA: 07/17/2016  PCP: Thressa Sheller, MD  Patient coming from:   Home   Chief Complaint:  HPI: Vincent Dunlap is a 75 y.o. male with a medical history significant for, but not  limited to, HTN and tobacco abuse. Patient presents to the emergency department with a two-week history of progressive dyspnea, worse with even minimal exertion. EMS found patient to be hypoxic, placed on CPAP.  Denies orthopnea.  No cough. No known fevers. No history of pneumonia nor heart failure. Patient is compliant with blood pressure medications at home. He doesn't add salt to food but does consume a lot of canned foods. He reports recent onset of lower extremity edema. He is dizzy with walking and standing.   ED Course:  Tachycardic 128, initially respirations 21, now normal at 12. Patient is normotensive. O2 saturation 94% on nasal cannula. Afebrile. Lactic acid 3.49 , point-of-care troponin 0.07 , BNP 1900  WBC 14 point 8, he has chronic leukocytosis with white count ranging anywhere from 12-15 Platelets 103, better than his baseline which is generally less than 100. Glucose 156 Chest x-ray with bilateral opacities, edema versus infectious process  Review of Systems: As per HPI, otherwise 10 point review of systems negative.  Acceptable ROS statements: "All others negative," "All others reviewed and are negative," and "All others unremarkable," with at [place] documented Can't double dip - if using for HPI can't use for ROS  Past Medical History:  Diagnosis Date  . Hypertension   . Tobacco abuse     History reviewed. No pertinent surgical history.  Social History   Social History  . Marital status: Single    Spouse name: N/A  . Number of children: N/A  . Years of education: N/A   Occupational History  . Not on file.   Social History Main Topics  . Smoking status: Current Some Day Smoker  . Smokeless tobacco: Former Systems developer   . Alcohol use Yes  . Drug use: No  . Sexual activity: Not on file   Other Topics Concern  . Not on file   Social History Narrative  . No narrative on file   Patient lives at home alone. He uses a cane for ambulation. Smokes approximately 3 cigarettes a day. No alcohol use Allergies  Allergen Reactions  . Chantix [Varenicline] Other (See Comments)    Pt states it made him crazy    Family History  Problem Relation Age of Onset  . Alzheimer's disease Mother     Prior to Admission medications   Medication Sig Start Date End Date Taking? Authorizing Provider  amitriptyline (ELAVIL) 25 MG tablet Take 25 mg by mouth at bedtime.   Yes Historical Provider, MD  amLODipine-benazepril (LOTREL) 5-20 MG per capsule Take 1 capsule by mouth daily.   Yes Historical Provider, MD  esomeprazole (NEXIUM) 20 MG capsule Take 20 mg by mouth daily as needed.   Yes Historical Provider, MD  oxyCODONE-acetaminophen (PERCOCET) 7.5-325 MG tablet Take 1 tablet by mouth every 6 (six) hours as needed. 07/05/16  Yes Historical Provider, MD  OXYCONTIN 20 MG 12 hr tablet Take 1 tablet by mouth 2 (two) times daily. 07/05/16  Yes Historical Provider, MD    Physical Exam: Vitals:   07/17/16 0712 07/17/16 0725 07/17/16 0800  BP: 138/75  120/69  Pulse: (!) 128  115  Resp: 21  12  Temp: 97.7 F (36.5 C)    TempSrc:  Oral    SpO2: 94% 94% 97%    Constitutional:  Pleasant, well-developed white male in NAD, calm, comfortable Vitals:   07/17/16 0712 07/17/16 0725 07/17/16 0800  BP: 138/75  120/69  Pulse: (!) 128  115  Resp: 21  12  Temp: 97.7 F (36.5 C)    TempSrc: Oral    SpO2: 94% 94% 97%   Eyes: PER, lids and conjunctivae normal ENMT: Mucous membranes are moist. Posterior pharynx clear of any exudate or lesions..  Neck: normal, supple, no masses Respiratory: Bilateral lower lobe crackles , diminished breath sounds . Currently no wheezing. Normal respiratory effort. No accessory muscle use.    Cardiovascular: Sinus tachycardia, 2+ RLE edema, 2-3 + LUE edema.  2+ dorsal pedis pulses.  Skin on his feet are dry with some dark red discoloration bottom of the toes.  Abdomen: no tenderness, no masses palpated. No hepatomegaly. Bowel sounds positive.  Musculoskeletal: no clubbing / cyanosis. No joint deformity upper and lower extremities. Good ROM, no contractures. Normal muscle tone.  Skin: no rashes, lesions, ulcers.  Neurologic: CN 2-12 grossly intact. Sensation intact, Strength 5/5 in all 4.  Psychiatric: Normal judgment and insight. Alert and oriented x 3. Normal mood.    Labs on Admission: I have personally reviewed following labs and imaging studies  Radiological Exams on Admission: Dg Chest Port 1 View  Result Date: 07/17/2016 CLINICAL DATA:  Shortness of breath for 2 weeks, worsening. Former smoker. EXAM: PORTABLE CHEST 1 VIEW COMPARISON:  08/19/2011 FINDINGS: Semi-erect portable AP view chest. There is linear scarring or atelectasis in the right upper lobe. Diffuse interstitial and alveolar opacities, right greater than left are present. There is thickening along the right minor fissure, similar compared to prior study. Probable small calcified granuloma in the right peripheral lower lung zone. No effusion. Cardiomediastinal silhouette nonenlarged. There is atherosclerosis of the aorta. There is no pneumothorax. IMPRESSION: Mild interstitial and alveolar right greater than left pulmonary opacities could be secondary to respiratory infection versus edema. No significant pleural effusion. Atherosclerotic vascular disease of the aorta Electronically Signed   By: Donavan Foil M.D.   On: 07/17/2016 08:31    Assessment/Plan   Principal Problem:   Acute respiratory failure with hypoxia (HCC) Active Problems:   Sinus tachycardia (HCC)   Dizziness   Hypertension   Elevated lactic acid level   Dyspnea     Acute respiratory failure with hypoxia /  BNP ~1900 / lower extremity edema  and possible pulmonary edema on cxr. BLE lower lobe infiltrates not excluded but no cough or fevers makes PNA less likely            -admit to tele -Heart failure order set utilized -stop IV fluid resuscitation, sepsis seems unlikely -lasix 51m IV now -monitor I+0, daily wts -Echocardiogram -ok to continue Nebulizers. Would use Xopenex given tachycardia -Got Vanco and Zosyn in ED for possible sepsis. No further antibiotics / IVF unless develops fever as CHF favored over PNA  -continue 02 per [place] - solumedrol in ED - no additional steroids for now -Nutritional consult prior to admission - low sodium diet    Sinus tachycardia / dizziness. EKG not scanned but reviewed at bedside by Dr. BDaleen Bo- t wave inversion and ST  depression noted.  -stepdown admission -serial trop  -echocardiogram -adding low dose Coreg  Elevated lactic acid - 3.49. Possibly secondary to hypoperfusion related to heart failure. Sepsis not high on DDx list for now.  -follow up on blood cx  ordered in ED  Leukocytosis, stable at 14.8. He has chronic leukocytosis with baseline 12-15.   Chronic thrombocytopenia. Platelets stable at 180, actually better than baseline (60-100) . Petechiae on lower extremities -SCDs for DVT prophylaxis  HTN, BP normal today  . -continue CCB and benazepril  Tobacco use, three cigarettes a day.   -RN to provide smoking cessation education  DVT prophylaxis:     SCDs    Code Status:     Full code    Family Communication:    none.  Disposition Plan:   Discharge home in 48-72 hours            Consults called:  None   Admission status:   Admission - stepdown  Tye Savoy NP [facility] Pager [phone]  If 7PM-7AM, please contact night-coverage [email or web address] Password [email or web address]  07/17/2016, 9:10 AM

## 2016-07-17 NOTE — Progress Notes (Signed)
  Echocardiogram 2D Echocardiogram has been performed.  Vincent Dunlap 07/17/2016, 2:31 PM

## 2016-07-17 NOTE — Progress Notes (Signed)
Troponin 0.2. ASA 324 mg x 1 ordered. Heparin gtt per pharmacy for ACS. Cardiology consult called - Dr. Cathie Olden

## 2016-07-17 NOTE — Progress Notes (Addendum)
ANTICOAGULATION CONSULT NOTE - Initial Consult  Pharmacy Consult:  Heparin Indication: chest pain/ACS  Allergies  Allergen Reactions  . Chantix [Varenicline] Other (See Comments)    Pt states it made him crazy    Patient Measurements: Height: 5\' 11"  (180.3 cm) Weight: 181 lb (82.1 kg) IBW/kg (Calculated) : 75.3 Heparin Dosing Weight: 82 kg  Vital Signs: Temp: 98.3 F (36.8 C) (09/09 2331) Temp Source: Oral (09/09 2331) BP: 112/64 (09/09 2331) Pulse Rate: 83 (09/09 2331)  Labs:  Recent Labs  07/17/16 0705 07/17/16 1025 07/17/16 1459 07/17/16 2132  HGB 12.1*  --   --   --   HCT 36.8*  --   --   --   PLT 180  --   --   --   HEPARINUNFRC  --   --   --  0.31  CREATININE 0.69  --   --   --   TROPONINI  --  0.20* 0.35* 0.50*    Estimated Creatinine Clearance: 86.3 mL/min (by C-G formula based on SCr of 0.8 mg/dL).   Medical History: Past Medical History:  Diagnosis Date  . Hypertension   . Tobacco abuse       Assessment: 41 YOM with elevated troponin to start IV heparin for rule out ACS.  Baseline labs reviewed.  Patient reported he is not on a blood thinner PTA.  First heparin level is therapeutic.   Goal of Therapy:  Heparin level 0.3-0.7 units/ml Monitor platelets by anticoagulation protocol: Yes    Plan:  -Continue heparin at 1150 units/hr -Daily HL, CBC -Next level with AM labs     Hughes Better, PharmD, BCPS Clinical Pharmacist 07/17/2016 11:40 PM

## 2016-07-17 NOTE — Progress Notes (Signed)
ANTICOAGULATION CONSULT NOTE - Initial Consult  Pharmacy Consult:  Heparin Indication: chest pain/ACS  Allergies  Allergen Reactions  . Chantix [Varenicline] Other (See Comments)    Pt states it made him crazy    Patient Measurements: Height: 5\' 11"  (180.3 cm) Weight: 181 lb (82.1 kg) IBW/kg (Calculated) : 75.3 Heparin Dosing Weight: 82 kg  Vital Signs: Temp: 97.7 F (36.5 C) (09/09 0712) Temp Source: Oral (09/09 0712) BP: 152/96 (09/09 1100) Pulse Rate: 143 (09/09 1100)  Labs:  Recent Labs  07/17/16 0705 07/17/16 1025  HGB 12.1*  --   HCT 36.8*  --   PLT 180  --   CREATININE 0.69  --   TROPONINI  --  0.20*    Estimated Creatinine Clearance: 86.3 mL/min (by C-G formula based on SCr of 0.8 mg/dL).   Medical History: Past Medical History:  Diagnosis Date  . Hypertension   . Tobacco abuse       Assessment: 1 YOM with elevated troponin to start IV heparin for rule out ACS.  Baseline labs reviewed.  Patient reported he is not on a blood thinner PTA.   Goal of Therapy:  Heparin level 0.3-0.7 units/ml Monitor platelets by anticoagulation protocol: Yes    Plan:  - Heparin 4000 units IV x 1, then - Heparin gtt at 1150 units/hr - Check 8 hr heparin level - Daily heparin level and CBC   Gretchen Weinfeld D. Mina Marble, PharmD, BCPS Pager:  [phone] 07/17/2016, 12:15 PM

## 2016-07-17 NOTE — ED Notes (Signed)
Pt sats remain 89 % on 4 liters , pt placed on NRB admitting MD.s notified , lasix ordered

## 2016-07-17 NOTE — ED Triage Notes (Signed)
Pt here from home with c/o sob , pt arrived on cpap from ems , according to ems sats were 88-89 % on room air St and labored pt received 1 sl nitro

## 2016-07-17 NOTE — ED Provider Notes (Signed)
[facility] DEPT Provider Note   CSN: [id number] Arrival date & time: 07/17/16  0705     History   Chief Complaint Chief Complaint  Patient presents with  . Shortness of Breath    HPI Vincent Dunlap is a 75 y.o. male.  HPI Patient presents with 2 weeks of increasing shortness of breath. States he's having trouble getting air in and out. Denies any cough, fever or chills. Denies chest pain or new lower extremity swelling. No orthopnea. Nitroglycerin in route by EMS. States he now has a headache. May be hypoxic by EMS and placed on CPAP. Patient tolerating nasal cannula in the emergency department.  Past Medical History:  Diagnosis Date  . Hypertension   . Tobacco abuse     Patient Active Problem List   Diagnosis Date Noted  . Pressure ulcer 07/18/2016  . Dyspnea 07/17/2016  . Acute respiratory failure with hypoxia ([place]) 07/17/2016  . Hypertension 07/17/2016  . Elevated lactic acid level 07/17/2016  . Sinus tachycardia ([place]) 07/17/2016  . Dizziness 07/17/2016  . Congestive heart failure ([place])   . Coronary artery disease due to lipid rich plaque   . NSTEMI (non-ST elevated myocardial infarction) ([place])   . At risk for falling 05/20/2014  . Thrombocytopenia ([place]) 08/30/2012    Past Surgical History:  Procedure Laterality Date  . INGUINAL HERNIA REPAIR Bilateral        Home Medications    Prior to Admission medications   Medication Sig Start Date End Date Taking? Authorizing Provider  amitriptyline (ELAVIL) 25 MG tablet Take 25 mg by mouth at bedtime.   Yes Historical Provider, MD  amLODipine-benazepril (LOTREL) 5-20 MG per capsule Take 1 capsule by mouth daily.   Yes Historical Provider, MD  esomeprazole (NEXIUM) 20 MG capsule Take 20 mg by mouth daily as needed.   Yes Historical Provider, MD  oxyCODONE-acetaminophen (PERCOCET) 7.5-325 MG tablet Take 1 tablet by mouth every 6 (six) hours as needed. 07/05/16  Yes Historical Provider, MD  OXYCONTIN 20 MG 12 hr  tablet Take 1 tablet by mouth 2 (two) times daily. 07/05/16  Yes Historical Provider, MD    Family History Family History  Problem Relation Age of Onset  . Alzheimer's disease Mother     Social History Social History  Substance Use Topics  . Smoking status: Current Some Day Smoker  . Smokeless tobacco: Former Systems developer  . Alcohol use Yes     Allergies   Chantix [varenicline]   Review of Systems Review of Systems  Constitutional: Negative for chills and fever.  HENT: Positive for rhinorrhea. Negative for sore throat.   Respiratory: Positive for shortness of breath. Negative for cough.   Cardiovascular: Negative for chest pain, palpitations and leg swelling.  Gastrointestinal: Negative for abdominal pain, constipation, diarrhea, nausea and vomiting.  Genitourinary: Negative for dysuria, flank pain and frequency.  Musculoskeletal: Negative for arthralgias, gait problem and myalgias.  Skin: Negative for rash and wound.  Neurological: Positive for headaches. Negative for dizziness, syncope, weakness, light-headedness and numbness.  All other systems reviewed and are negative.    Physical Exam Updated Vital Signs BP (!) 93/55 (BP Location: Right Arm)   Pulse 84   Temp 97.9 F (36.6 C) (Oral)   Resp 13   Ht 5\' 11"  ([id number] m)   Wt 161 lb (73 kg) Comment: Pt stated his normal wt was 164, not sure about 181lbs 9/9  SpO2 99%   BMI 22.45 kg/m   Physical Exam  Constitutional: He is  oriented to person, place, and time. He appears well-developed and well-nourished. No distress.  Disheveled appearance.  HENT:  Head: Normocephalic and atraumatic.  Mouth/Throat: Oropharynx is clear and moist.  Eyes: EOM are normal. Pupils are equal, round, and reactive to light.  Neck: Normal range of motion. Neck supple.  Cardiovascular: Regular rhythm.   Tachycardia  Pulmonary/Chest: Effort normal. He has wheezes.  Prolonged expiratory phase. End expiratory wheezing especially in the bilateral  bases  Abdominal: Soft. Bowel sounds are normal. There is no tenderness. There is no rebound and no guarding.  Musculoskeletal: Normal range of motion. He exhibits edema. He exhibits no tenderness.  1+ bilateral lower extremity edema. No calf asymmetry or tenderness. 2+ dorsalis pedis and posterior tibial pulses.  Neurological: He is alert and oriented to person, place, and time.  Moves all extremities without deficit. Sensation is fully intact.  Skin: Skin is warm and dry. No rash noted. No erythema.  Psychiatric: He has a normal mood and affect. His behavior is normal.  Nursing note and vitals reviewed.    ED Treatments / Results  Labs (all labs ordered are listed, but only abnormal results are displayed) Labs Reviewed  COMPREHENSIVE METABOLIC PANEL - Abnormal; Notable for the following:       Result Value   Chloride 98 (*)    Glucose, Bld 156 (*)    Total Protein 6.3 (*)    Albumin 3.4 (*)    All other components within normal limits  CBC WITH DIFFERENTIAL/PLATELET - Abnormal; Notable for the following:    WBC 14.8 (*)    RBC 4.09 (*)    Hemoglobin 12.1 (*)    HCT 36.8 (*)    RDW 17.1 (*)    Neutro Abs 11.8 (*)    All other components within normal limits  BRAIN NATRIURETIC PEPTIDE - Abnormal; Notable for the following:    B Natriuretic Peptide 1,914.6 (*)    All other components within normal limits  URINALYSIS, ROUTINE W REFLEX MICROSCOPIC (NOT AT [facility]) - Abnormal; Notable for the following:    Ketones, ur 15 (*)    All other components within normal limits  TROPONIN I - Abnormal; Notable for the following:    Troponin I 0.20 (*)    All other components within normal limits  TROPONIN I - Abnormal; Notable for the following:    Troponin I 0.35 (*)    All other components within normal limits  TROPONIN I - Abnormal; Notable for the following:    Troponin I 0.50 (*)    All other components within normal limits  BASIC METABOLIC PANEL - Abnormal; Notable for the  following:    Sodium 132 (*)    Potassium 3.4 (*)    Chloride 95 (*)    Calcium 8.3 (*)    All other components within normal limits  CBC - Abnormal; Notable for the following:    RBC 3.45 (*)    Hemoglobin 10.0 (*)    HCT 30.7 (*)    RDW 17.1 (*)    All other components within normal limits  I-STAT CG4 LACTIC ACID, ED - Abnormal; Notable for the following:    Lactic Acid, Venous 3.49 (*)    All other components within normal limits  MRSA PCR SCREENING  CULTURE, BLOOD (ROUTINE X 2)  CULTURE, BLOOD (ROUTINE X 2)  TSH  HEPARIN LEVEL (UNFRACTIONATED)  LACTIC ACID, PLASMA  LACTIC ACID, PLASMA  LIPID PANEL  HEPARIN LEVEL (UNFRACTIONATED)  I-STAT TROPOININ, ED  EKG  EKG Interpretation None       Radiology Dg Chest Port 1 View  Result Date: 07/18/2016 CLINICAL DATA:  Dyspnea EXAM: PORTABLE CHEST 1 VIEW COMPARISON:  07/17/2016 FINDINGS: 0523 hours. Cardiopericardial silhouette is at upper limits of normal for size. Pulmonary vascular congestion with bilateral interstitial and basilar airspace disease suggests edema, minimally improved in the interval. Bones are diffusely demineralized. Telemetry leads overlie the chest. IMPRESSION: Interval improvement in lung aeration suggesting improving edema. Electronically Signed   By: Misty Stanley M.D.   On: 07/18/2016 07:22   Dg Chest Port 1 View  Result Date: 07/17/2016 CLINICAL DATA:  Shortness of breath for 2 weeks, worsening. Former smoker. EXAM: PORTABLE CHEST 1 VIEW COMPARISON:  08/19/2011 FINDINGS: Semi-erect portable AP view chest. There is linear scarring or atelectasis in the right upper lobe. Diffuse interstitial and alveolar opacities, right greater than left are present. There is thickening along the right minor fissure, similar compared to prior study. Probable small calcified granuloma in the right peripheral lower lung zone. No effusion. Cardiomediastinal silhouette nonenlarged. There is atherosclerosis of the aorta. There is  no pneumothorax. IMPRESSION: Mild interstitial and alveolar right greater than left pulmonary opacities could be secondary to respiratory infection versus edema. No significant pleural effusion. Atherosclerotic vascular disease of the aorta Electronically Signed   By: Donavan Foil M.D.   On: 07/17/2016 08:31    Procedures Procedures (including critical care time)  Medications Ordered in ED Medications  sodium chloride flush (NS) 0.9 % injection 3 mL (3 mLs Intravenous Given 07/18/16 0948)  sodium chloride flush (NS) 0.9 % injection 3 mL (not administered)  0.9 %  sodium chloride infusion (not administered)  acetaminophen (TYLENOL) tablet 650 mg (650 mg Oral Given 07/17/16 1313)  ondansetron (ZOFRAN) injection 4 mg (not administered)  carvedilol (COREG) tablet 3.125 mg (3.125 mg Oral Given 07/18/16 0947)  aspirin tablet 325 mg (325 mg Oral Given 07/18/16 0947)  heparin ADULT infusion 100 units/mL (25000 units/276mL sodium chloride 0.45%) (1,200 Units/hr Intravenous Rate/Dose Change 07/18/16 0914)  furosemide (LASIX) injection 60 mg (60 mg Intravenous Given 07/18/16 1108)  MEDLINE mouth rinse (15 mLs Mouth Rinse Given 07/18/16 1000)  nitroGLYCERIN 50 mg in dextrose 5 % 250 mL (0.2 mg/mL) infusion (5 mcg/min Intravenous New Bag/Given 07/18/16 0948)  traMADol (ULTRAM) tablet 50 mg (50 mg Oral Given 07/18/16 1311)  methylPREDNISolone sodium succinate (SOLU-MEDROL) 125 mg/2 mL injection 125 mg (125 mg Intravenous Given 07/17/16 0734)  levalbuterol (XOPENEX) nebulizer solution 0.63 mg (0.63 mg Nebulization Given 07/17/16 0734)  sodium chloride 0.9 % bolus 1,000 mL (0 mLs Intravenous Stopped 07/17/16 0950)  piperacillin-tazobactam (ZOSYN) IVPB 3.375 g (0 g Intravenous Stopped 07/17/16 1217)  vancomycin (VANCOCIN) IVPB 1000 mg/200 mL premix (0 mg Intravenous Stopped 07/17/16 1046)  furosemide (LASIX) injection 60 mg (60 mg Intravenous Given 07/17/16 1007)  heparin bolus via infusion 4,000 Units (4,000 Units Intravenous  Bolus from Bag 07/17/16 1314)  potassium chloride SA (K-DUR,KLOR-CON) CR tablet 40 mEq (40 mEq Oral Given 07/17/16 1803)  potassium chloride SA (K-DUR,KLOR-CON) CR tablet 40 mEq (40 mEq Oral Given 07/18/16 1311)     Initial Impression / Assessment and Plan / ED Course  I have reviewed the triage vital signs and the nursing notes.  Pertinent labs & imaging results that were available during my care of the patient were reviewed by me and considered in my medical decision making (see chart for details).  Clinical Course   Due to concerns for fluid overload and  stable blood pressure, chose to gently  rehydrate as opposed to sepsis protocol 30 mL per KG.  Discussed with [facility] and will admit. 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

## 2016-07-17 NOTE — ED Notes (Signed)
Report given.

## 2016-07-17 NOTE — Plan of Care (Signed)
Problem: Food- and Nutrition-Related Knowledge Deficit (NB-1.1) Goal: Nutrition education Formal process to instruct or train a patient/client in a skill or to impart knowledge to help patients/clients voluntarily manage or modify food choices and eating behavior to maintain or improve health.  Outcome: Adequate for Discharge Nutrition Education Note  RD consulted for nutrition education regarding new onset CHF.  RD provided "Heart Failure Nutrition Therapy" handout from the [employer].  Reviewed patient's dietary recall.   Pt is largely limited by his poor health status. 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  He reports that he receives all his meals from a home service. They additionally do all of his grocery shopping.   He is very vague about what he eats. For breakfast he has eggs, bacon, sausage. He says his lunch and dinner consists of "fatty foods", but does not give much detail.   Other: He does report eating a lot of canned items. He had initially stated he also ate a lot of TV dinners, but retracted this later and said the meals from this home health agency LOOK like TV dinners. He uses deli meats for sandwiches. He only drinks gatorade and water. He does not use a salt shaker and has "banished" salt from his house with the exception of what is inherently in the food. He says he never eats out due to his poor health.   RD explained that almost all convenience foods are high in sodium. In fact, the easier they are to prepare, the more sodium they will have.  RD went over label reading so he could measure the amount of sodium in his diet. Gave goal of <2000 mg/day. RD gave examples of how to calculate amount of sodium consumed while taking into account portion size. He is taught that this goal is a daily one, therefore, he can eat a salty meal and still stay below the limit  by eating very low salt items at the remaining meals.    When he does shop he is told to choose the "low sodium" or "sodium free" option. If he doesn't shop himself he should share this information with the person who does.    RD spent considerable amount of time coming up with easy to prepare meals that are low in sodium. Geralyn Flash is a food that he "loves". He is told to purchase the tuna that is in a pouch/canned in water. These should have minimal amounts of sodium. Also recommended is pasta. He is not a diabetic and this is one of the easiest meals to make. He would just need to be careful of what kind and how much sauce he uses. Low sodium Peanut butter for peanut butter and jelly would also be an easy to prepare meal that would be a good choice.  Explained that his deli meats are high iin sodium. RD asked if he had anyone that could help him prepare meals. He said he did. RD asked if he could have someone cook a large batch of fresh meat that he could use for his sandwiches. As a secondary approach, he could rinse these off.   Pt says he does not have a scale at home. RD recommended he get one to weigh himself daily. Explained that this can be used as a "report card" for how well he is eating. If he gains 2-3 lbs in one day  he should look back at what he ate the day prior as he potentially could have over consumed salt.   He is already aware his canned vegetables are typically canned with salt. RD recommended frozen vegetable microwave bags as an alternative. These are comparable in price and salt free. Alternatively, he could rinse his canned vegetables off before he eats them.   He is advised to not eat any bacon/sausage. He says he is not crazy about these anyway. Recommended other breakfast foods such as oatmeal, eggs, fresh fruit, granola/protein bars.   Encouraged fresh fruits and vegetables as well as whole grain sources of carbohydrates to maximize fiber intake.   RD discussed why it is  important for patient to adhere to diet recommendations, and emphasized the role of fluids, foods to avoid, and importance of weighing self daily.  RD asked pt to call his home health agency to ask about a low sodium diet choice as it is very hard to believe they would not offer this option given the multitude of people with cardiovascular issues.   Summary of recommendations:  1. Cut out bacon/sausage 2. Eat condensed soup no more often then every other day 3. Choose tuna, PB, pasta over deli meats, soups, frozen meals.  4: Eat frozen vegetables >canned vegetables.  5. Calculate sodium intake, keep < 2000mg  day 6. Purchase scale, weigh daily  Expect fair compliance.- pt is severely limited due to poor health/functional status. He says he is too sick to shop or prepare meals on his own. He is brought all of his meals and groceries and states the agency that provides these services "does not care about what is healthy". He says "I used to eat healthy, but now I am too sick". He is unable to raise his arms very high at all due to torn rotator cuffs   Body mass index is 25.24 kg/m. Pt meets criteria for Overweight based on current BMI.  Current diet order is Heart Healthy, there is no documented PO intake at this time. If additional nutrition issues arise, please re-consult RD.   Burtis Junes RD, LDN, CNSC Clinical Nutrition Pager: [phone] 07/17/2016 3:01 PM

## 2016-07-17 NOTE — Consult Note (Signed)
CONSULT NOTE  Date: 07/17/2016               Patient Name:  Vincent Dunlap MRN: [id number]  DOB: 02-Mar-1941 Age / Sex: 75 y.o., male        PCP: [facility] Primary Cardiologist: New / [facility] Previously [facility]            Referring Physician: Daleen Bo              Reason for Consult: CHF,            History of Present Illness: Patient is a 75 y.o. male with a PMHx  , HTN and tobacco abuse. He is admitted with a two-week history of progressive shortness of breath. He eats lots of salty foods and eat canned foods on a regular basis. His chest pain  consistent with congestive heart failure. his troponin levels are elevated. We are asked to see him for further evaluation.  He's had intermittent chest pains in the past. He had lots of chest pain several weeks ago. The chest pain radiated through to his back. He also describes some arm pain and weakness.   This chest pain worsened with exertion. He's had progressive shortness of breath as well. He was so short of breath that he quit smoking 2 weeks ago.     Medications: Outpatient medications: Prescriptions Prior to Admission  Medication Sig Dispense Refill Last Dose  . amitriptyline (ELAVIL) 25 MG tablet Take 25 mg by mouth at bedtime.   Past Week at Unknown time  . amLODipine-benazepril (LOTREL) 5-20 MG per capsule Take 1 capsule by mouth daily.   Past Week at Unknown time  . esomeprazole (NEXIUM) 20 MG capsule Take 20 mg by mouth daily as needed.   Past Week at Unknown time  . oxyCODONE-acetaminophen (PERCOCET) 7.5-325 MG tablet Take 1 tablet by mouth every 6 (six) hours as needed.   07/16/2016 at Unknown time  . OXYCONTIN 20 MG 12 hr tablet Take 1 tablet by mouth 2 (two) times daily.   07/16/2016 at Unknown time    Current medications: Current Facility-Administered Medications  Medication Dose Route Frequency Provider Last Rate Last Dose  . 0.9 %  sodium chloride infusion  250 mL Intravenous PRN Willia Craze, NP       . acetaminophen (TYLENOL) tablet 650 mg  650 mg Oral Q4H PRN Willia Craze, NP   650 mg at 07/17/16 1313  . amLODipine (NORVASC) tablet 5 mg  5 mg Oral Daily Willia Craze, NP   5 mg at 07/17/16 1313  . aspirin tablet 325 mg  325 mg Oral Daily Willia Craze, NP   325 mg at 07/17/16 1313  . benazepril (LOTENSIN) tablet 20 mg  20 mg Oral Daily Willia Craze, NP   20 mg at 07/17/16 1312  . carvedilol (COREG) tablet 3.125 mg  3.125 mg Oral BID WC Willia Craze, NP      . heparin ADULT infusion 100 units/mL (25000 units/262m sodium chloride 0.45%)  1,150 Units/hr Intravenous Continuous TTyrone Apple RPH 11.5 mL/hr at 07/17/16 1400 1,150 Units/hr at 07/17/16 1400  . ondansetron (ZOFRAN) injection 4 mg  4 mg Intravenous Q6H PRN PWillia Craze NP      . oxyCODONE (OXYCONTIN) 12 hr tablet 20 mg  20 mg Oral BID PWillia Craze NP   20 mg at 07/17/16 1313  . potassium chloride SA (K-DUR,KLOR-CON) CR tablet 40 mEq  40  mEq Oral Once Willia Craze, NP      . sodium chloride flush (NS) 0.9 % injection 3 mL  3 mL Intravenous Q12H Willia Craze, NP   3 mL at 07/17/16 1321  . sodium chloride flush (NS) 0.9 % injection 3 mL  3 mL Intravenous PRN Willia Craze, NP         Allergies  Allergen Reactions  . Chantix [Varenicline] Other (See Comments)    Pt states it made him crazy     Past Medical History:  Diagnosis Date  . Hypertension   . Tobacco abuse     Past Surgical History:  Procedure Laterality Date  . INGUINAL HERNIA REPAIR Bilateral     Family History  Problem Relation Age of Onset  . Alzheimer's disease Mother     Social History:  reports that he has been smoking.  He has quit using smokeless tobacco. He reports that he drinks alcohol. He reports that he does not use drugs.   Review of Systems: Constitutional:  denies fever, chills, diaphoresis, appetite change and fatigue.  HEENT: denies photophobia, eye pain, redness, hearing loss, ear pain, congestion,  sore throat, rhinorrhea, sneezing, neck pain, neck stiffness and tinnitus.  Respiratory: admits to SOB, DOE, cough, chest tightness, and wheezing.  Cardiovascular: admits to chest pain, palpitations and leg swelling.  Gastrointestinal: denies nausea, vomiting, abdominal pain, diarrhea, constipation, blood in stool.  Genitourinary: denies dysuria, urgency, frequency, hematuria, flank pain and difficulty urinating.  Musculoskeletal: denies  myalgias, back pain, joint swelling, arthralgias and gait problem.   Skin: denies pallor, rash and wound.  Neurological: denies dizziness, seizures, syncope, weakness, light-headedness, numbness and headaches.   Hematological: denies adenopathy, easy bruising, personal or family bleeding history.  Psychiatric/ Behavioral: denies suicidal ideation, mood changes, confusion, nervousness, sleep disturbance and agitation.    Physical Exam: BP (!) 122/59 (BP Location: Right Arm)   Pulse (!) 113   Temp 97.7 F (36.5 C) (Oral)   Resp 14   Ht 5' 11"  (1.803 m)   Wt 181 lb (82.1 kg)   SpO2 97%   BMI 25.24 kg/m   Wt Readings from Last 3 Encounters:  07/17/16 181 lb (82.1 kg)  08/30/12 198 lb 1.6 oz (89.9 kg)    General: Vital signs reviewed and noted. Well-developed, well-nourished, in no acute distress; alert,   Head: Normocephalic, atraumatic, sclera anicteric,   Neck: Supple. Negative for carotid bruits. No JVD   Lungs:  Rales, rhonchi and wheezes bilaterally   Heart: RRR with S1 S2.  + S3 gallop   Abdomen/ GI :  Soft, non-tender, non-distended with normoactive bowel sounds. No hepatomegaly. No rebound/guarding. No obvious abdominal masses   MSK: Strength and the appear normal for age.   Extremities: No clubbing or cyanosis. No edema.  Distal pedal pulses are 2+ and equal   Neurologic:  CN are grossly intact,  No obvious motor or sensory defect.  Alert and oriented X 3. Moves all extremities spontaneously.  Psych: Responds to questions appropriately  with a normal affect.     Lab results: Basic Metabolic Panel:  Recent Labs Lab 07/17/16 0705  NA 137  K 3.5  CL 98*  CO2 24  GLUCOSE 156*  BUN 9  CREATININE 0.69  CALCIUM 9.0    Liver Function Tests:  Recent Labs Lab 07/17/16 0705  AST 38  ALT 19  ALKPHOS 79  BILITOT 1.0  PROT 6.3*  ALBUMIN 3.4*   No results for input(s): LIPASE,  AMYLASE in the last 168 hours. No results for input(s): AMMONIA in the last 168 hours.  CBC:  Recent Labs Lab 07/17/16 0705  WBC 14.8*  NEUTROABS 11.8*  HGB 12.1*  HCT 36.8*  MCV 90.0  PLT 180    Cardiac Enzymes:  Recent Labs Lab 07/17/16 1025  TROPONINI 0.20*    BNP: Invalid input(s): POCBNP  CBG: No results for input(s): GLUCAP in the last 168 hours.  Coagulation Studies: No results for input(s): LABPROT, INR in the last 72 hours.   Other results: Personal review of EKG shows :  Sinus tach at 107. ST depression in the anterior lateral leads.   Imaging: Dg Chest Port 1 View  Result Date: 07/17/2016 CLINICAL DATA:  Shortness of breath for 2 weeks, worsening. Former smoker. EXAM: PORTABLE CHEST 1 VIEW COMPARISON:  08/19/2011 FINDINGS: Semi-erect portable AP view chest. There is linear scarring or atelectasis in the right upper lobe. Diffuse interstitial and alveolar opacities, right greater than left are present. There is thickening along the right minor fissure, similar compared to prior study. Probable small calcified granuloma in the right peripheral lower lung zone. No effusion. Cardiomediastinal silhouette nonenlarged. There is atherosclerosis of the aorta. There is no pneumothorax. IMPRESSION: Mild interstitial and alveolar right greater than left pulmonary opacities could be secondary to respiratory infection versus edema. No significant pleural effusion. Atherosclerotic vascular disease of the aorta Electronically Signed   By: Donavan Foil M.D.   On: 07/17/2016 08:31       Assessment & Plan:   1. Acute  on chronic systolic congestive heart failure: Mr. Crampton  presents with symptoms that are consistent with acute on chronic congestive heart failure. He's had progressive shortness of breath and leg edema for the past 2 weeks. He also has had chest pain and intermittent intrascapular pain. I suspect that he had an anterior wall myocardial infarction earlier this week. He eats lots of canned foods. I've advised him to decrease his salt intake.  He has 1+ ankle edema. He has rales on exam. He needs IV Lasix. BID  Continue losartan and carvedilol for now.  He'll ultimately need Cardiac catheterization.     2. Coronary artery disease:   He has a history of cigarette smoking. He stopped smoking 2 weeks ago but was previously a 2 pack-a-day smoker. He has positive troponin levels. His echocardiogram shows akinesis of the mid and distal anterior wall consistent with an LAD myocardial infarction. We'll check fasting lipids tomorrow.  We have discussed the risks, benefits, and options regarding heart catheterization. He understands and agrees to proceed.  We'll put him on the schedule for Monday. He'll need to be diuresed a fair amount for he's ready for cardiac cath.   Thayer Headings, Brooke Bonito., MD, [facility] 07/17/2016, 3:01 PM Office - [phone] Pager [phone]

## 2016-07-17 NOTE — ED Notes (Signed)
NRB removed and placed back on 2 liters n/c O2

## 2016-07-18 ENCOUNTER — Inpatient Hospital Stay (HOSPITAL_COMMUNITY): Payer: Medicare Other

## 2016-07-18 DIAGNOSIS — I10 Essential (primary) hypertension: Secondary | ICD-10-CM

## 2016-07-18 DIAGNOSIS — R0600 Dyspnea, unspecified: Secondary | ICD-10-CM

## 2016-07-18 DIAGNOSIS — L8990 Pressure ulcer of unspecified site, unspecified stage: Secondary | ICD-10-CM | POA: Insufficient documentation

## 2016-07-18 LAB — BASIC METABOLIC PANEL
ANION GAP: 9 (ref 5–15)
BUN: 12 mg/dL (ref 6–20)
CALCIUM: 8.3 mg/dL — AB (ref 8.9–10.3)
CO2: 28 mmol/L (ref 22–32)
CREATININE: 0.7 mg/dL (ref 0.61–1.24)
Chloride: 95 mmol/L — ABNORMAL LOW (ref 101–111)
GFR calc non Af Amer: >60 mL/min (ref >60)
Glucose, Bld: 97 mg/dL (ref 65–99)
Potassium: 3.4 mmol/L — ABNORMAL LOW (ref 3.5–5.1)
SODIUM: 132 mmol/L — AB (ref 135–145)

## 2016-07-18 LAB — CBC
HCT: 30.7 % — ABNORMAL LOW (ref 39.0–52.0)
HEMOGLOBIN: 10 g/dL — AB (ref 13.0–17.0)
MCH: 29 pg (ref 26.0–34.0)
MCHC: 32.6 g/dL (ref 30.0–36.0)
MCV: 89 fL (ref 78.0–100.0)
PLATELETS: 161 10*3/uL (ref 150–400)
RBC: 3.45 MIL/uL — AB (ref 4.22–5.81)
RDW: 17.1 % — ABNORMAL HIGH (ref 11.5–15.5)
WBC: 9 10*3/uL (ref 4.0–10.5)

## 2016-07-18 LAB — HEPARIN LEVEL (UNFRACTIONATED): HEPARIN UNFRACTIONATED: 0.34 [IU]/mL (ref 0.30–0.70)

## 2016-07-18 MED ORDER — NITROGLYCERIN IN D5W 200-5 MCG/ML-% IV SOLN
0.0000 ug/min | INTRAVENOUS | Status: DC
  Administered 2016-07-18: 5 ug/min via INTRAVENOUS
  Filled 2016-07-18: qty 250

## 2016-07-18 MED ORDER — TRAMADOL HCL 50 MG PO TABS
50.0000 mg | ORAL_TABLET | Freq: Four times a day (QID) | ORAL | Status: DC | PRN
  Administered 2016-07-18 – 2016-07-25 (×13): 50 mg via ORAL
  Filled 2016-07-18 (×13): qty 1

## 2016-07-18 MED ORDER — POTASSIUM CHLORIDE CRYS ER 20 MEQ PO TBCR
40.0000 meq | EXTENDED_RELEASE_TABLET | ORAL | Status: AC
  Administered 2016-07-18 (×2): 40 meq via ORAL
  Filled 2016-07-18 (×2): qty 2

## 2016-07-18 NOTE — Progress Notes (Signed)
Weighed patient this morning via bed scales and it read 161 lbs. Asked patient if we could get a stand up weight and he stated he couldn't stand-up. (My legs are to shakey.)!Marland Kitchen Asked RX if the prior weight of 181lbs was an estimated number and they aren't sure. Patient stated his normal weight is 164lbs. Will continue with daily weights.

## 2016-07-18 NOTE — Progress Notes (Signed)
PROGRESS NOTE  Subjective:   Patient is a 75 y.o. male with a PMHx  , HTN and tobacco abuse. He is admitted with a two-week history of progressive shortness of breath. He eats lots of salty foods and eat canned foods on a regular basis  Had a NSTEMI around 5 days ago  Has developed CHF  He had some CP Saturday night.    No CP at present     Objective:    Vital Signs:   Temp:  [97.7 F (36.5 C)-98.4 F (36.9 C)] 97.8 F (36.6 C) (09/10 0403) Pulse Rate:  [68-143] 79 (09/10 0700) Resp:  [10-28] 10 (09/10 0700) BP: (89-156)/(58-96) 98/58 (09/10 0700) SpO2:  [87 %-99 %] 99 % (09/10 0700) Weight:  [161 lb (73 kg)-181 lb (82.1 kg)] 161 lb (73 kg) (09/10 0403)  Last BM Date: 07/16/16   24-hour weight change: Weight change:   Weight trends: Filed Weights   07/17/16 1200 07/18/16 0403  Weight: 181 lb (82.1 kg) 161 lb (73 kg)    Intake/Output:  09/09 0701 - 09/10 0700 In: 146.8 [I.V.:146.8] Out: 2775 [Urine:2775] No intake/output data recorded.   Physical Exam: BP (!) 98/58 (BP Location: Right Arm)   Pulse 79   Temp 97.8 F (36.6 C) (Oral)   Resp 10   Ht 5' 11"  (1.803 m)   Wt 161 lb (73 kg) Comment: Pt stated his normal wt was 164, not sure about 181lbs 9/9  SpO2 99%   BMI 22.45 kg/m   Wt Readings from Last 3 Encounters:  07/18/16 161 lb (73 kg)  08/30/12 198 lb 1.6 oz (89.9 kg)    General: Vital signs reviewed and noted.   Head: Normocephalic, atraumatic.  Eyes: conjunctivae/corneas clear.  EOM's intact.   Throat: normal  Neck:  normal   Lungs:    clear  Heart:  RR  Abdomen:  Soft, non-tender, non-distended    Extremities: No edema.   Good pulses   Neurologic: A&O X3, CN II - XII are grossly intact.   Psych: Normal     Labs: BMET:  Recent Labs  07/17/16 0705 07/18/16 0200  NA 137 132*  K 3.5 3.4*  CL 98* 95*  CO2 24 28  GLUCOSE 156* 97  BUN 9 12  CREATININE 0.69 0.70  CALCIUM 9.0 8.3*    Liver function tests:  Recent  Labs  07/17/16 0705  AST 38  ALT 19  ALKPHOS 79  BILITOT 1.0  PROT 6.3*  ALBUMIN 3.4*   No results for input(s): LIPASE, AMYLASE in the last 72 hours.  CBC:  Recent Labs  07/17/16 0705 07/18/16 0200  WBC 14.8* 9.0  NEUTROABS 11.8*  --   HGB 12.1* 10.0*  HCT 36.8* 30.7*  MCV 90.0 89.0  PLT 180 161    Cardiac Enzymes:  Recent Labs  07/17/16 1025 07/17/16 1459 07/17/16 2132  TROPONINI 0.20* 0.35* 0.50*    Coagulation Studies: No results for input(s): LABPROT, INR in the last 72 hours.  Other: Invalid input(s): POCBNP No results for input(s): DDIMER in the last 72 hours. No results for input(s): HGBA1C in the last 72 hours.  Recent Labs  07/17/16 1600  CHOL 151  HDL 67  LDLCALC 77  TRIG 33  CHOLHDL 2.3    Recent Labs  07/17/16 1025  TSH 0.618   No results for input(s): VITAMINB12, FOLATE, FERRITIN, TIBC, IRON, RETICCTPCT in the last 72 hours.   Other results:  Tele   (  personally reviewed )  - sinus tach   Medications:    Infusions: . heparin 1,150 Units/hr (07/18/16 0615)    Scheduled Medications: . aspirin  325 mg Oral Daily  . carvedilol  3.125 mg Oral BID WC  . furosemide  60 mg Intravenous BID  . mouth rinse  15 mL Mouth Rinse BID  . oxyCODONE  20 mg Oral BID  . sodium chloride flush  3 mL Intravenous Q12H    Assessment/ Plan:   Principal Problem:   Acute respiratory failure with hypoxia (HCC) Active Problems:   Dyspnea   Hypertension   Elevated lactic acid level   Sinus tachycardia (HCC)   Dizziness   Coronary artery disease due to lipid rich plaque   NSTEMI (non-ST elevated myocardial infarction) ([facility])  1. NSTEMI:   Symptoms / ECG / troponin  are c/w a NSTEMI . On heparin. Will start IV NTG.  Will need to watch BP closely.   Was started on Coreg last night  For right and left heart cath tomorrow   2. Acute on chronic CHF:    Has diuresed well On coreg.   Not able to start ARB yet due to hypotension Feeling  better  3.  HTN:   BP is better.    Disposition:  Length of Stay: 1  Thayer Headings, Brooke Bonito., MD, [facility] 07/18/2016, 8:22 AM Office [phone] Pager [phone]

## 2016-07-18 NOTE — Progress Notes (Signed)
[facility] Progress Note  Patient: Vincent Dunlap OZH:[id number]   PCP: Thressa Sheller, MD DOB: 03/30/1941   DOA: 07/17/2016   DOS: 07/18/2016   Date of Service: the patient was seen and examined on 07/18/2016  Subjective: Patient complains of central chest pain. Return also complains about generalized body ache. Denies any other acute complaint. Tolerating oral diet.  Brief hospital course: Pt. with PMH of HTN, CAD, pressure ulcer; admitted on 07/17/2016, with complaint of chest pain, was found to have elevated troponin with suspected non-STEMI. Also has acute on chronic CHF Currently further plan is continue diuresis and follow up on cardiac catheterization scheduled on 07/19/2016.  Assessment and Plan: 1. Acute respiratory failure with hypoxia (HCC) From acute suspected combined CHF. Non-STEMI.  Appreciate cardiology input. She was started on IV nitroglycerin which he is not able to tolerate due to blood pressure. Patient is on Coreg with holding parameters. ACE inhibitor was started with the patient is not able to tolerate due to blood pressure. Patient is for right and left heart catheterization on 07/19/2016. Continuing on IV heparin. Continuing on IV Lasix. Continue to monitor in the step down unit.  2. Chronic pain syndrome. Patient appears to be on chronic oxycodone at home. Given the patient's low blood pressure at present I will be holding off on the oxycodone. Monitor clinically and adjust accordingly.  3. Hypotension. This is likely from medications. We'll continue to closely monitor. No indication for IV fluid at present.  4. Chronic, cytopenia. Need to monitor while the patient is on heparin.  Pain management: When necessary tramadol Activity: Consulted physical therapy Bowel regimen: last BM 07/11/2016. Bowel regimen ordered. Diet: Cardiac diet, nothing by mouth after midnight DVT Prophylaxis: on therapeutic anticoagulation.  Advance goals of care  discussion: Full code  Family Communication: no family was present at bedside, at the time of interview.  Disposition:  Discharge to home. Expected discharge date: 07/20/2016, improvement in volume status  Consultants: Cardiology Procedures: none  Antibiotics: Anti-infectives    Start     Dose/Rate Route Frequency Ordered Stop   07/17/16 0815  piperacillin-tazobactam (ZOSYN) IVPB 3.375 g     3.375 g 100 mL/hr over 30 Minutes Intravenous  Once 07/17/16 0814 07/17/16 1217   07/17/16 0815  vancomycin (VANCOCIN) IVPB 1000 mg/200 mL premix     1,000 mg 200 mL/hr over 60 Minutes Intravenous  Once 07/17/16 0814 07/17/16 1046        Intake/Output Summary (Last 24 hours) at 07/18/16 1938 Last data filed at 07/18/16 1900  Gross per 24 hour  Intake           846.41 ml  Output             2100 ml  Net         -1253.59 ml   Filed Weights   07/17/16 1200 07/18/16 0403  Weight: 82.1 kg (181 lb) 73 kg (161 lb)    Objective: Physical Exam: Vitals:   07/18/16 1505 07/18/16 1600 07/18/16 1700 07/18/16 1900  BP: (!) 85/69 (!) 81/48 90/60 (!) 92/52  Pulse: 83 93 94 98  Resp: 13 19 19 19   Temp:  98.2 F (36.8 C)    TempSrc:  Oral    SpO2:  100% 99% 99%  Weight:      Height:        General: Alert, Awake and Oriented to Time, Place and Person. Appear in mild distress Eyes: PERRL, Conjunctiva normal ENT: Oral Mucosa clear moist. Neck: difficult  to assess JVD, no Abnormal Mass Or lumps Cardiovascular: S1 and S2 Present, aortic systolic Murmur, Respiratory: Bilateral Air entry equal and Decreased, basal Crackles, no wheezes Abdomen: Bowel Sound present, Soft and no tenderness Skin: no redness, no Rash  Extremities: no Pedal edema, no calf tenderness Neurologic: Grossly no focal neuro deficit. Bilaterally Equal motor strength  Data Reviewed: CBC:  Recent Labs Lab 07/17/16 0705 07/18/16 0200  WBC 14.8* 9.0  NEUTROABS 11.8*  --   HGB 12.1* 10.0*  HCT 36.8* 30.7*  MCV 90.0  89.0  PLT 180 353   Basic Metabolic Panel:  Recent Labs Lab 07/17/16 0705 07/18/16 0200  NA 137 132*  K 3.5 3.4*  CL 98* 95*  CO2 24 28  GLUCOSE 156* 97  BUN 9 12  CREATININE 0.69 0.70  CALCIUM 9.0 8.3*    Liver Function Tests:  Recent Labs Lab 07/17/16 0705  AST 38  ALT 19  ALKPHOS 79  BILITOT 1.0  PROT 6.3*  ALBUMIN 3.4*   No results for input(s): LIPASE, AMYLASE in the last 168 hours. No results for input(s): AMMONIA in the last 168 hours. Coagulation Profile: No results for input(s): INR, PROTIME in the last 168 hours. Cardiac Enzymes:  Recent Labs Lab 07/17/16 1025 07/17/16 1459 07/17/16 2132  TROPONINI 0.20* 0.35* 0.50*   BNP (last 3 results) No results for input(s): PROBNP in the last 8760 hours.  CBG: No results for input(s): GLUCAP in the last 168 hours.  Studies: Dg Chest Port 1 View  Result Date: 07/18/2016 CLINICAL DATA:  Dyspnea EXAM: PORTABLE CHEST 1 VIEW COMPARISON:  07/17/2016 FINDINGS: 0523 hours. Cardiopericardial silhouette is at upper limits of normal for size. Pulmonary vascular congestion with bilateral interstitial and basilar airspace disease suggests edema, minimally improved in the interval. Bones are diffusely demineralized. Telemetry leads overlie the chest. IMPRESSION: Interval improvement in lung aeration suggesting improving edema. Electronically Signed   By: Misty Stanley M.D.   On: 07/18/2016 07:22     Scheduled Meds: . aspirin  325 mg Oral Daily  . carvedilol  3.125 mg Oral BID WC  . furosemide  60 mg Intravenous BID  . mouth rinse  15 mL Mouth Rinse BID  . sodium chloride flush  3 mL Intravenous Q12H   Continuous Infusions: . heparin 1,200 Units/hr (07/18/16 0914)  . nitroGLYCERIN Stopped (07/18/16 1649)   PRN Meds: sodium chloride, acetaminophen, ondansetron (ZOFRAN) IV, sodium chloride flush, traMADol  Time spent: 30 minutes  Author: Berle Mull, MD [facility] Pager: [phone] 07/18/2016 7:38  PM  If 7PM-7AM, please contact night-coverage at [email or web address], password [facility]

## 2016-07-18 NOTE — Progress Notes (Signed)
ANTICOAGULATION CONSULT NOTE - Initial Consult  Pharmacy Consult:  Heparin Indication: chest pain/ACS  Allergies  Allergen Reactions  . Chantix [Varenicline] Other (See Comments)    Pt states it made him crazy    Patient Measurements: Height: 5\' 11"  (180.3 cm) Weight: 161 lb (73 kg) (Pt stated his normal wt was 164, not sure about 181lbs 9/9) IBW/kg (Calculated) : 75.3 Heparin Dosing Weight: 82 kg  Vital Signs: Temp: 97.8 F (36.6 C) (09/10 0403) Temp Source: Oral (09/10 0403) BP: 98/58 (09/10 0700) Pulse Rate: 79 (09/10 0700)  Labs:  Recent Labs  07/17/16 0705 07/17/16 1025 07/17/16 1459 07/17/16 2132 07/18/16 0200  HGB 12.1*  --   --   --  10.0*  HCT 36.8*  --   --   --  30.7*  PLT 180  --   --   --  161  HEPARINUNFRC  --   --   --  0.31 0.34  CREATININE 0.69  --   --   --  0.70  TROPONINI  --  0.20* 0.35* 0.50*  --     Estimated Creatinine Clearance: 83.6 mL/min (by C-G formula based on SCr of 0.8 mg/dL).   Medical History: Past Medical History:  Diagnosis Date  . Hypertension   . Tobacco abuse       Assessment: 29 YOM with elevated troponin to start IV heparin for rule out ACS. Patient reported he is not on a blood thinner PTA. Plan for cath 9/11  Heparin level in low end therapeutic range x 2, Hgb 10, sl down, pltc 161k  Goal of Therapy:  Heparin level 0.3-0.7 units/ml Monitor platelets by anticoagulation protocol: Yes    Plan:  -Increase heparin slightly to 1200 units/hr to stay in therapeutic range -Daily HL, CBC -F/u Cath  Maryanna Shape, PharmD, BCPS  Clinical Pharmacist  Pager: [phone]   07/18/2016 9:09 AM

## 2016-07-18 NOTE — Progress Notes (Signed)
Patient's BP 81/48, asymptomatic.  Cardiology PA paged and IV nitro stopped.  Shannon, Ardeth Sportsman

## 2016-07-19 ENCOUNTER — Encounter (HOSPITAL_COMMUNITY)
Admission: EM | Disposition: A | Discharge status: Skilled Nursing Facility | Payer: Self-pay | Source: Home / Self Care | Attending: Internal Medicine

## 2016-07-19 DIAGNOSIS — I25110 Atherosclerotic heart disease of native coronary artery with unstable angina pectoris: Secondary | ICD-10-CM

## 2016-07-19 DIAGNOSIS — I255 Ischemic cardiomyopathy: Secondary | ICD-10-CM

## 2016-07-19 HISTORY — PX: CARDIAC CATHETERIZATION: SHX172

## 2016-07-19 LAB — POCT I-STAT 3, VENOUS BLOOD GAS (G3P V)
ACID-BASE EXCESS: 1 mmol/L (ref 0.0–2.0)
BICARBONATE: 25.6 mmol/L (ref 20.0–28.0)
O2 SAT: 48 %
PO2 VEN: 25 mmHg — AB (ref 32.0–45.0)
TCO2: 27 mmol/L (ref 0–100)
pCO2, Ven: 38.1 mmHg — ABNORMAL LOW (ref 44.0–60.0)
pH, Ven: 7.436 — ABNORMAL HIGH (ref 7.250–7.430)

## 2016-07-19 LAB — CBC WITH DIFFERENTIAL/PLATELET
BASOS ABS: 0 10*3/uL (ref 0.0–0.1)
Basophils Relative: 0 %
Eosinophils Absolute: 0.3 10*3/uL (ref 0.0–0.7)
Eosinophils Relative: 2 %
HEMATOCRIT: 33.7 % — AB (ref 39.0–52.0)
HEMOGLOBIN: 10.9 g/dL — AB (ref 13.0–17.0)
LYMPHS ABS: 4.1 10*3/uL — AB (ref 0.7–4.0)
LYMPHS PCT: 32 %
MCH: 29.3 pg (ref 26.0–34.0)
MCHC: 32.3 g/dL (ref 30.0–36.0)
MCV: 90.6 fL (ref 78.0–100.0)
Monocytes Absolute: 1.2 10*3/uL — ABNORMAL HIGH (ref 0.1–1.0)
Monocytes Relative: 9 %
NEUTROS ABS: 7.4 10*3/uL (ref 1.7–7.7)
NEUTROS PCT: 57 %
PLATELETS: 148 10*3/uL — AB (ref 150–400)
RBC: 3.72 MIL/uL — AB (ref 4.22–5.81)
RDW: 17.1 % — ABNORMAL HIGH (ref 11.5–15.5)
WBC: 13 10*3/uL — AB (ref 4.0–10.5)

## 2016-07-19 LAB — COMPREHENSIVE METABOLIC PANEL
ALK PHOS: 65 U/L (ref 38–126)
ALT: 15 U/L — AB (ref 17–63)
AST: 27 U/L (ref 15–41)
Albumin: 3.2 g/dL — ABNORMAL LOW (ref 3.5–5.0)
Anion gap: 8 (ref 5–15)
BUN: 16 mg/dL (ref 6–20)
CALCIUM: 8.5 mg/dL — AB (ref 8.9–10.3)
CHLORIDE: 94 mmol/L — AB (ref 101–111)
CO2: 28 mmol/L (ref 22–32)
CREATININE: 0.66 mg/dL (ref 0.61–1.24)
Glucose, Bld: 101 mg/dL — ABNORMAL HIGH (ref 65–99)
Potassium: 3.8 mmol/L (ref 3.5–5.1)
Sodium: 130 mmol/L — ABNORMAL LOW (ref 135–145)
Total Bilirubin: 0.6 mg/dL (ref 0.3–1.2)
Total Protein: 5.5 g/dL — ABNORMAL LOW (ref 6.5–8.1)

## 2016-07-19 LAB — MAGNESIUM: MAGNESIUM: 1.4 mg/dL — AB (ref 1.7–2.4)

## 2016-07-19 LAB — GLUCOSE, CAPILLARY: Glucose-Capillary: 143 mg/dL — ABNORMAL HIGH (ref 65–99)

## 2016-07-19 LAB — POCT I-STAT 3, ART BLOOD GAS (G3+)
ACID-BASE EXCESS: 1 mmol/L (ref 0.0–2.0)
Bicarbonate: 24.2 mmol/L (ref 20.0–28.0)
O2 SAT: 96 %
TCO2: 25 mmol/L (ref 0–100)
pCO2 arterial: 31.8 mmHg — ABNORMAL LOW (ref 32.0–48.0)
pH, Arterial: 7.489 — ABNORMAL HIGH (ref 7.350–7.450)
pO2, Arterial: 74 mmHg — ABNORMAL LOW (ref 83.0–108.0)

## 2016-07-19 LAB — PROTIME-INR
INR: 1.1
PROTHROMBIN TIME: 14.3 s (ref 11.4–15.2)

## 2016-07-19 LAB — POCT ACTIVATED CLOTTING TIME: Activated Clotting Time: 98 seconds

## 2016-07-19 LAB — HEPARIN LEVEL (UNFRACTIONATED): HEPARIN UNFRACTIONATED: 0.42 [IU]/mL (ref 0.30–0.70)

## 2016-07-19 SURGERY — RIGHT/LEFT HEART CATH AND CORONARY ANGIOGRAPHY
Anesthesia: LOCAL

## 2016-07-19 MED ORDER — MAGNESIUM SULFATE 2 GM/50ML IV SOLN
2.0000 g | Freq: Once | INTRAVENOUS | Status: AC
  Administered 2016-07-19: 2 g via INTRAVENOUS
  Filled 2016-07-19: qty 50

## 2016-07-19 MED ORDER — MIDAZOLAM HCL 2 MG/2ML IJ SOLN
INTRAMUSCULAR | Status: AC
  Filled 2016-07-19: qty 2

## 2016-07-19 MED ORDER — FENTANYL CITRATE (PF) 100 MCG/2ML IJ SOLN
INTRAMUSCULAR | Status: DC | PRN
  Administered 2016-07-19 (×2): 25 ug via INTRAVENOUS

## 2016-07-19 MED ORDER — DIAZEPAM 5 MG PO TABS: 5.0000 mg | ORAL_TABLET | ORAL | Status: DC | PRN

## 2016-07-19 MED ORDER — MIDAZOLAM HCL 2 MG/2ML IJ SOLN
INTRAMUSCULAR | Status: DC | PRN
  Administered 2016-07-19 (×2): 1 mg via INTRAVENOUS

## 2016-07-19 MED ORDER — FENTANYL CITRATE (PF) 100 MCG/2ML IJ SOLN
INTRAMUSCULAR | Status: AC
  Filled 2016-07-19: qty 2

## 2016-07-19 MED ORDER — ASPIRIN 81 MG PO CHEW
81.0000 mg | CHEWABLE_TABLET | Freq: Every day | ORAL | Status: DC
  Administered 2016-07-19 – 2016-07-25 (×7): 81 mg via ORAL
  Filled 2016-07-19 (×6): qty 1

## 2016-07-19 MED ORDER — IOPAMIDOL (ISOVUE-370) INJECTION 76%
INTRAVENOUS | Status: AC
  Filled 2016-07-19: qty 100

## 2016-07-19 MED ORDER — ASPIRIN 325 MG PO TABS
325.0000 mg | ORAL_TABLET | Freq: Every day | ORAL | Status: DC
  Filled 2016-07-19: qty 1

## 2016-07-19 MED ORDER — NITROGLYCERIN 0.4 MG SL SUBL
0.4000 mg | SUBLINGUAL_TABLET | SUBLINGUAL | Status: DC | PRN
  Administered 2016-07-19: 0.4 mg via SUBLINGUAL
  Filled 2016-07-19: qty 1

## 2016-07-19 MED ORDER — ONDANSETRON HCL 4 MG/2ML IJ SOLN: 4.0000 mg | Freq: Four times a day (QID) | INTRAMUSCULAR | Status: DC | PRN

## 2016-07-19 MED ORDER — LIDOCAINE HCL (PF) 1 % IJ SOLN
INTRAMUSCULAR | Status: DC | PRN
  Administered 2016-07-19: 18 mL

## 2016-07-19 MED ORDER — SODIUM CHLORIDE 0.9 % WEIGHT BASED INFUSION
3.0000 mL/kg/h | INTRAVENOUS | Status: DC
  Administered 2016-07-19: 3 mL/kg/h via INTRAVENOUS

## 2016-07-19 MED ORDER — ATORVASTATIN CALCIUM 80 MG PO TABS
80.0000 mg | ORAL_TABLET | Freq: Every day | ORAL | Status: DC
  Administered 2016-07-19 – 2016-07-24 (×6): 80 mg via ORAL
  Filled 2016-07-19 (×7): qty 1

## 2016-07-19 MED ORDER — ASPIRIN 81 MG PO CHEW
81.0000 mg | CHEWABLE_TABLET | ORAL | Status: DC
  Filled 2016-07-19: qty 1

## 2016-07-19 MED ORDER — HEPARIN (PORCINE) IN NACL 2-0.9 UNIT/ML-% IJ SOLN
INTRAMUSCULAR | Status: AC
  Filled 2016-07-19: qty 1500

## 2016-07-19 MED ORDER — SODIUM CHLORIDE 0.9% FLUSH: 3.0000 mL | INTRAVENOUS | Status: DC | PRN

## 2016-07-19 MED ORDER — SODIUM CHLORIDE 0.9% FLUSH
3.0000 mL | Freq: Two times a day (BID) | INTRAVENOUS | Status: DC
  Administered 2016-07-19: 3 mL via INTRAVENOUS

## 2016-07-19 MED ORDER — HEPARIN (PORCINE) IN NACL 100-0.45 UNIT/ML-% IJ SOLN
1350.0000 [IU]/h | INTRAMUSCULAR | Status: DC
  Administered 2016-07-19: 1200 [IU]/h via INTRAVENOUS
  Administered 2016-07-20 – 2016-07-22 (×4): 1350 [IU]/h via INTRAVENOUS
  Filled 2016-07-19 (×4): qty 250

## 2016-07-19 MED ORDER — SODIUM CHLORIDE 0.9 % WEIGHT BASED INFUSION
1.0000 mL/kg/h | INTRAVENOUS | Status: DC
Start: 2016-07-20 — End: 2016-07-19

## 2016-07-19 MED ORDER — SODIUM CHLORIDE 0.9 % IV SOLN: 250.0000 mL | INTRAVENOUS | Status: DC | PRN

## 2016-07-19 MED ORDER — ACETAMINOPHEN 325 MG PO TABS: 650.0000 mg | ORAL_TABLET | ORAL | Status: DC | PRN

## 2016-07-19 MED ORDER — SODIUM CHLORIDE 0.9% FLUSH
3.0000 mL | Freq: Two times a day (BID) | INTRAVENOUS | Status: DC
  Administered 2016-07-20 (×2): 3 mL via INTRAVENOUS

## 2016-07-19 MED ORDER — LIDOCAINE HCL (PF) 1 % IJ SOLN
INTRAMUSCULAR | Status: AC
  Filled 2016-07-19: qty 30

## 2016-07-19 MED ORDER — SODIUM CHLORIDE 0.9 % IV SOLN
INTRAVENOUS | Status: DC
  Administered 2016-07-19: 13:00:00 via INTRAVENOUS

## 2016-07-19 MED ORDER — HEPARIN (PORCINE) IN NACL 2-0.9 UNIT/ML-% IJ SOLN
INTRAMUSCULAR | Status: DC | PRN
  Administered 2016-07-19: 12:00:00

## 2016-07-19 SURGICAL SUPPLY — 15 items
CATH INFINITI 5FR ANG PIGTAIL (CATHETERS) ×2 IMPLANT
CATH INFINITI 5FR JL4 (CATHETERS) ×2 IMPLANT
CATH INFINITI JR4 5F (CATHETERS) ×2 IMPLANT
CATH SITESEER 5F NTR (CATHETERS) ×2 IMPLANT
CATH SWAN GANZ 7F STRAIGHT (CATHETERS) ×2 IMPLANT
KIT HEART LEFT (KITS) ×2 IMPLANT
KIT HEART RIGHT NAMIC (KITS) ×2 IMPLANT
PACK CARDIAC CATHETERIZATION (CUSTOM PROCEDURE TRAY) ×2 IMPLANT
SHEATH PINNACLE 5F 10CM (SHEATH) ×2 IMPLANT
SHEATH PINNACLE 7F 10CM (SHEATH) ×2 IMPLANT
SYR MEDRAD MARK V 150ML (SYRINGE) ×2 IMPLANT
TRANSDUCER W/STOPCOCK (MISCELLANEOUS) ×4 IMPLANT
TUBING CIL FLEX 10 FLL-RA (TUBING) ×2 IMPLANT
WIRE EMERALD 3MM-J .025X260CM (WIRE) ×2 IMPLANT
WIRE EMERALD 3MM-J .035X150CM (WIRE) ×4 IMPLANT

## 2016-07-19 NOTE — Interval H&P Note (Signed)
History and Physical Interval Note:  07/19/2016 11:01 AM  Vincent Dunlap  has presented today for surgery, with the diagnosis of CHF  The various methods of treatment have been discussed with the patient and family. After consideration of risks, benefits and other options for treatment, the patient has consented to  Procedure(s): Right/Left Heart Cath and Coronary Angiography (N/A) as a surgical intervention .  The patient's history has been reviewed, patient examined, no change in status, stable for surgery.  I have reviewed the patient's chart and labs.  Questions were answered to the patient's satisfaction.     Shelva Majestic

## 2016-07-19 NOTE — Progress Notes (Signed)
Pt states lives at home . He only gets ups when aid is there and he holds onto aid and uses cane. He can't remember name of agency will make sw ref.

## 2016-07-19 NOTE — Interval H&P Note (Signed)
Cath Lab Visit (complete for each Cath Lab visit)  Clinical Evaluation Leading to the Procedure:   ACS: No.  Non-ACS:    Anginal Classification: CCS IV  Anti-ischemic medical therapy: Maximal Therapy (2 or more classes of medications)  Non-Invasive Test Results: No non-invasive testing performed  Prior CABG: No previous CABG      History and Physical Interval Note:  07/19/2016 11:02 AM  Vincent Dunlap  has presented today for surgery, with the diagnosis of CHF  The various methods of treatment have been discussed with the patient and family. After consideration of risks, benefits and other options for treatment, the patient has consented to  Procedure(s): Right/Left Heart Cath and Coronary Angiography (N/A) as a surgical intervention .  The patient's history has been reviewed, patient examined, no change in status, stable for surgery.  I have reviewed the patient's chart and labs.  Questions were answered to the patient's satisfaction.     Shelva Majestic

## 2016-07-19 NOTE — Progress Notes (Signed)
Pt confused, believes he is at home. He called 911 on his cell phone so EMS could bring him to the hospital. When nurse reoriented pt he was able to correctly tell the date, his birthday and that he had a heart cath done today, but believes he is at home. Nurse explained situation to pt and upon returning a second time pt was able to state that he was at [facility] and that he did not need to call 911 anymore. CBG 146, Vitals stable, will continue to monitor pt.

## 2016-07-19 NOTE — Progress Notes (Signed)
Patient Name: Vincent Dunlap Date of Encounter: 07/19/2016    SUBJECTIVE: He feels well this morning. He says he came to the hospital for 3 reasons: Dizziness, shortness of breath, and chest pain. He only complains of dizziness currently.  TELEMETRY:  Normal sinus rhythm Vitals:   07/19/16 0018 07/19/16 0400 07/19/16 0755 07/19/16 0800  BP: (!) 92/56 113/62 110/70 103/67  Pulse: 91 89 94 96  Resp: 12 12 12 11   Temp: 98.7 F (37.1 C) 97.9 F (36.6 C) 97.5 F (36.4 C)   TempSrc: Oral Oral Oral   SpO2: 98% 97% 98% 98%  Weight:  157 lb 9.6 oz (71.5 kg)    Height:        Intake/Output Summary (Last 24 hours) at 07/19/16 1019 Last data filed at 07/19/16 0900  Gross per 24 hour  Intake          1311.15 ml  Output             1400 ml  Net           -88.85 ml   LABS: Basic Metabolic Panel:  Recent Labs  07/18/16 0200 07/19/16 0302  NA 132* 130*  K 3.4* 3.8  CL 95* 94*  CO2 28 28  GLUCOSE 97 101*  BUN 12 16  CREATININE 0.70 0.66  CALCIUM 8.3* 8.5*  MG  --  1.4*   CBC:  Recent Labs  07/17/16 0705 07/18/16 0200 07/19/16 0302  WBC 14.8* 9.0 13.0*  NEUTROABS 11.8*  --  7.4  HGB 12.1* 10.0* 10.9*  HCT 36.8* 30.7* 33.7*  MCV 90.0 89.0 90.6  PLT 180 161 148*   Cardiac Enzymes:  Recent Labs  07/17/16 1025 07/17/16 1459 07/17/16 2132  TROPONINI 0.20* 0.35* 0.50*   BNP: Invalid input(s): POCBNP Hemoglobin A1C: No results for input(s): HGBA1C in the last 72 hours. Fasting Lipid Panel:  Recent Labs  07/17/16 1600  CHOL 151  HDL 67  LDLCALC 77  TRIG 33  CHOLHDL 2.3    Radiology/Studies:  Improving pulmonary edema on chest x-ray 07/18/16  Physical Exam: Blood pressure 103/67, pulse 96, temperature 97.5 F (36.4 C), temperature source Oral, resp. rate 11, height 5' 11"  (1.803 m), weight 157 lb 9.6 oz (71.5 kg), SpO2 98 %. Weight change: -23 lb 6.4 oz (-10.614 kg)  Wt Readings from Last 3 Encounters:  07/19/16 157 lb 9.6 oz (71.5 kg)    08/30/12 198 lb 1.6 oz (89.9 kg)    Frail appearing but in no acute distress. Excellent bilateral radial pulses. Chest is clear to auscultation and percussion. Cardiac exam reveals no rub, murmur, or gallop. Extremities reveal no edema.   ASSESSMENT:  1. Non-ST elevation myocardial infarction , without recurrent chest discomfort 2. 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 Etiology of heart failure is uncertain.  3. Essential hypertension 4. Dizziness of uncertain etiology.   Plan:  1. For left and right heart cath later today to determine if coronary artery disease could be the source of the patient's heart failure.  2. The patient was counseled to undergo left and right heart catheterization, coronary angiography, and possible percutaneous coronary intervention with stent implantation. The procedural risks and benefits were discussed in detail. The risks discussed included death, stroke, myocardial infarction, life-threatening bleeding, limb ischemia, kidney injury, allergy, and possible emergency cardiac surgery. The risk of these significant complications were estimated to occur less than 1% of the time. After discussion, the patient has agreed  to proceed.    Signed, Belva Crome III 07/19/2016, 10:19 AM

## 2016-07-19 NOTE — Progress Notes (Signed)
[place] TEAM 1 - Stepdown/ICU TEAM  Travarus Trudo Kroening  UPB:[id number] DOB: 12-02-1940 DOA: 07/17/2016 PCP: Thressa Sheller, MD    Brief Narrative:  75 year old male with history of HTN, CAD, and a pressure ulcer who was admitted on 07/17/2016 with a complaint of chest pain w/ progressive SOB and was found to have elevated troponin with suspected non-STEMI as well as acute on chronic CHF.  Subjective: The patient is resting comfortably this morning.  He complains of some lightheadedness/dizziness particularly with positional changes.  He denies current chest pain shortness of breath fevers chills nausea vomiting or abdominal pain.  Assessment & Plan:  Acute hypoxic respiratory failure due to acute exacerbation of systolic CHF EF 84-12% via TTE this admit - no ACEi/ARB due to hypotension - net negative approximately 2.5 L since admission with weight steadily trending downward - for right and left heart cath today Filed Weights   07/17/16 1200 07/18/16 0403 07/19/16 0400  Weight: 82.1 kg (181 lb) 73 kg (161 lb) 71.5 kg (157 lb 9.6 oz)    Non-STEMI Unable to tolerate IV nitro due to hypotension -   Chronic pain syndrome on chronic oxycodone - holding at present due to hypotension  - no evidence of uncontrolled pain   Hypotension care with blood pressure medications and diuresis   Hypomagnesemia Replace and follow  Chronic thrombocytopenia Baseline appears to be anywhere between 60 and 180 - stable at present - followed by hematology as outpatient - felt to be due to splenomegaly as well as prior history of alcohol abuse  Gout / Osteoarthritis  DVT prophylaxis: IV heparin  Code Status: FULL CODE Family Communication: no family present at time of exam  Disposition Plan: SDU pending cardiac cath   Consultants:  [facility] Cards   Procedures: TTE 9/9  Antimicrobials:  Zosyn 9/9 Vancomycin 9/9  Objective: Blood pressure 103/67, pulse 96, temperature 97.5 F (36.4 C), temperature  source Oral, resp. rate 11, height 5' 11"  (1.803 m), weight 71.5 kg (157 lb 9.6 oz), SpO2 98 %.  Intake/Output Summary (Last 24 hours) at 07/19/16 1015 Last data filed at 07/19/16 0900  Gross per 24 hour  Intake          1311.15 ml  Output             1400 ml  Net           -88.85 ml   Filed Weights   07/17/16 1200 07/18/16 0403 07/19/16 0400  Weight: 82.1 kg (181 lb) 73 kg (161 lb) 71.5 kg (157 lb 9.6 oz)    Examination: General: No acute respiratory distress Lungs: Clear to auscultation bilaterally without wheezes or crackles Cardiovascular: Regular rate and rhythm without murmur gallop or rub normal S1 and S2 Abdomen: Nontender, nondistended, soft, bowel sounds positive, no rebound, no ascites, no appreciable mass Extremities: No significant cyanosis, clubbing, or edema bilateral lower extremities  CBC:  Recent Labs Lab 07/17/16 0705 07/18/16 0200 07/19/16 0302  WBC 14.8* 9.0 13.0*  NEUTROABS 11.8*  --  7.4  HGB 12.1* 10.0* 10.9*  HCT 36.8* 30.7* 33.7*  MCV 90.0 89.0 90.6  PLT 180 161 820*   Basic Metabolic Panel:  Recent Labs Lab 07/17/16 0705 07/18/16 0200 07/19/16 0302  NA 137 132* 130*  K 3.5 3.4* 3.8  CL 98* 95* 94*  CO2 24 28 28   GLUCOSE 156* 97 101*  BUN 9 12 16   CREATININE 0.69 0.70 0.66  CALCIUM 9.0 8.3* 8.5*  MG  --   --  1.4*   GFR: Estimated Creatinine Clearance: 81.9 mL/min (by C-G formula based on SCr of 0.8 mg/dL).  Liver Function Tests:  Recent Labs Lab 07/17/16 0705 07/19/16 0302  AST 38 27  ALT 19 15*  ALKPHOS 79 65  BILITOT 1.0 0.6  PROT 6.3* 5.5*  ALBUMIN 3.4* 3.2*    Coagulation Profile:  Recent Labs Lab 07/19/16 0301  INR 1.10    Cardiac Enzymes:  Recent Labs Lab 07/17/16 1025 07/17/16 1459 07/17/16 2132  TROPONINI 0.20* 0.35* 0.50*    Recent Results (from the past 240 hour(s))  Culture, blood (Routine X 2) w Reflex to ID Panel     Status: None (Preliminary result)   Collection Time: 07/17/16  8:30 AM    Result Value Ref Range Status   Specimen Description BLOOD RIGHT ANTECUBITAL  Final   Special Requests BOTTLES DRAWN AEROBIC AND ANAEROBIC 5CC  Final   Culture NO GROWTH 1 DAY  Final   Report Status PENDING  Incomplete  Culture, blood (Routine X 2) w Reflex to ID Panel     Status: None (Preliminary result)   Collection Time: 07/17/16  8:40 AM  Result Value Ref Range Status   Specimen Description BLOOD LEFT HAND  Final   Special Requests BOTTLES DRAWN AEROBIC AND ANAEROBIC 5CC  Final   Culture NO GROWTH 1 DAY  Final   Report Status PENDING  Incomplete  MRSA PCR Screening     Status: None   Collection Time: 07/17/16 12:34 PM  Result Value Ref Range Status   MRSA by PCR NEGATIVE NEGATIVE Final    Comment:        The GeneXpert MRSA Assay (FDA approved for NASAL specimens only), is one component of a comprehensive MRSA colonization surveillance program. It is not intended to diagnose MRSA infection nor to guide or monitor treatment for MRSA infections.      Scheduled Meds: . aspirin  81 mg Oral Daily  . carvedilol  3.125 mg Oral BID WC  . furosemide  60 mg Intravenous BID  . mouth rinse  15 mL Mouth Rinse BID  . sodium chloride flush  3 mL Intravenous Q12H  . sodium chloride flush  3 mL Intravenous Q12H   Continuous Infusions: . [START ON 07/20/2016] sodium chloride 3 mL/kg/hr (07/19/16 0502)   Followed by  . [START ON 07/20/2016] sodium chloride 1 mL/kg/hr (07/19/16 7026)  . heparin 1,200 Units/hr (07/19/16 0424)  . nitroGLYCERIN Stopped (07/18/16 1649)     LOS: 2 days   Cherene Altes, MD [facility] Office  [phone] Pager - Text Page per Amion as per below:  On-Call/Text Page:      [email or web address]      password [email or web address]  If 7PM-7AM, please contact night-coverage [email or web address] Password [email or web address] 07/19/2016, 10:15 AM

## 2016-07-19 NOTE — Progress Notes (Signed)
ANTICOAGULATION CONSULT NOTE - Initial Consult  Pharmacy Consult:  Heparin Indication: chest pain/ACS  Allergies  Allergen Reactions  . Chantix [Varenicline] Other (See Comments)    Pt states it made him crazy    Patient Measurements: Height: 5\' 11"  (180.3 cm) Weight: 157 lb 9.6 oz (71.5 kg) IBW/kg (Calculated) : 75.3 Heparin Dosing Weight: 82 kg  Vital Signs: Temp: 97.6 F (36.4 C) (09/11 1300) Temp Source: Oral (09/11 1300) BP: 113/84 (09/11 1345) Pulse Rate: 119 (09/11 1345)  Labs:  Recent Labs  07/17/16 0705 07/17/16 1025 07/17/16 1459 07/17/16 2132 07/18/16 0200 07/19/16 0301 07/19/16 0302  HGB 12.1*  --   --   --  10.0*  --  10.9*  HCT 36.8*  --   --   --  30.7*  --  33.7*  PLT 180  --   --   --  161  --  148*  LABPROT  --   --   --   --   --  14.3  --   INR  --   --   --   --   --  1.10  --   HEPARINUNFRC  --   --   --  0.31 0.34 0.42  --   CREATININE 0.69  --   --   --  0.70  --  0.66  TROPONINI  --  0.20* 0.35* 0.50*  --   --   --     Estimated Creatinine Clearance: 81.9 mL/min (by C-G formula based on SCr of 0.8 mg/dL).   Medical History: Past Medical History:  Diagnosis Date  . Hypertension   . Tobacco abuse       Assessment: 74 yoM started on heparin for ACS presented to the cath lab today 9/11. MD ordered heparin per pharmacy to begin 8-hours post-sheath removal with no bolus. Per nurse, pt with multivessel disease, likely to need surgical consult for further intervention. Previously therapeutic on 1200 units/hr, will restart at this dose at 2000 (sheath removal was 1200) with no bolus. Hgb 10.9, plt 148, no S/Sx bleeding noted.  Goal of Therapy:  Heparin level 0.3-0.7 units/ml Monitor platelets by anticoagulation protocol: Yes    Plan:  -Restart heparin 1200 units/hr at 2000 -Daily HL, CBC -Follow-up further plans for surgical intervention  Arrie Senate, PharmD PGY-1 Pharmacy Resident Pager: [phone] 07/19/2016

## 2016-07-19 NOTE — H&P (View-Only) (Signed)
Patient Name: Vincent Dunlap Date of Encounter: 07/19/2016    SUBJECTIVE: He feels well this morning. He says he came to the hospital for 3 reasons: Dizziness, shortness of breath, and chest pain. He only complains of dizziness currently.  TELEMETRY:  Normal sinus rhythm Vitals:   07/19/16 0018 07/19/16 0400 07/19/16 0755 07/19/16 0800  BP: (!) 92/56 113/62 110/70 103/67  Pulse: 91 89 94 96  Resp: 12 12 12 11   Temp: 98.7 F (37.1 C) 97.9 F (36.6 C) 97.5 F (36.4 C)   TempSrc: Oral Oral Oral   SpO2: 98% 97% 98% 98%  Weight:  157 lb 9.6 oz (71.5 kg)    Height:        Intake/Output Summary (Last 24 hours) at 07/19/16 1019 Last data filed at 07/19/16 0900  Gross per 24 hour  Intake          1311.15 ml  Output             1400 ml  Net           -88.85 ml   LABS: Basic Metabolic Panel:  Recent Labs  07/18/16 0200 07/19/16 0302  NA 132* 130*  K 3.4* 3.8  CL 95* 94*  CO2 28 28  GLUCOSE 97 101*  BUN 12 16  CREATININE 0.70 0.66  CALCIUM 8.3* 8.5*  MG  --  1.4*   CBC:  Recent Labs  07/17/16 0705 07/18/16 0200 07/19/16 0302  WBC 14.8* 9.0 13.0*  NEUTROABS 11.8*  --  7.4  HGB 12.1* 10.0* 10.9*  HCT 36.8* 30.7* 33.7*  MCV 90.0 89.0 90.6  PLT 180 161 148*   Cardiac Enzymes:  Recent Labs  07/17/16 1025 07/17/16 1459 07/17/16 2132  TROPONINI 0.20* 0.35* 0.50*   BNP: Invalid input(s): POCBNP Hemoglobin A1C: No results for input(s): HGBA1C in the last 72 hours. Fasting Lipid Panel:  Recent Labs  07/17/16 1600  CHOL 151  HDL 67  LDLCALC 77  TRIG 33  CHOLHDL 2.3    Radiology/Studies:  Improving pulmonary edema on chest x-ray 07/18/16  Physical Exam: Blood pressure 103/67, pulse 96, temperature 97.5 F (36.4 C), temperature source Oral, resp. rate 11, height 5' 11"  (1.803 m), weight 157 lb 9.6 oz (71.5 kg), SpO2 98 %. Weight change: -23 lb 6.4 oz (-10.614 kg)  Wt Readings from Last 3 Encounters:  07/19/16 157 lb 9.6 oz (71.5 kg)    08/30/12 198 lb 1.6 oz (89.9 kg)    Frail appearing but in no acute distress. Excellent bilateral radial pulses. Chest is clear to auscultation and percussion. Cardiac exam reveals no rub, murmur, or gallop. Extremities reveal no edema.   ASSESSMENT:  1. Non-ST elevation myocardial infarction , without recurrent chest discomfort 2. 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 Etiology of heart failure is uncertain.  3. Essential hypertension 4. Dizziness of uncertain etiology.   Plan:  1. For left and right heart cath later today to determine if coronary artery disease could be the source of the patient's heart failure.  2. The patient was counseled to undergo left and right heart catheterization, coronary angiography, and possible percutaneous coronary intervention with stent implantation. The procedural risks and benefits were discussed in detail. The risks discussed included death, stroke, myocardial infarction, life-threatening bleeding, limb ischemia, kidney injury, allergy, and possible emergency cardiac surgery. The risk of these significant complications were estimated to occur less than 1% of the time. After discussion, the patient has agreed  to proceed.    Signed, Belva Crome III 07/19/2016, 10:19 AM

## 2016-07-20 ENCOUNTER — Encounter (HOSPITAL_COMMUNITY): Payer: Self-pay | Admitting: Cardiovascular Disease

## 2016-07-20 DIAGNOSIS — T7589XA Other specified effects of external causes, initial encounter: Secondary | ICD-10-CM

## 2016-07-20 DIAGNOSIS — Z779 Other contact with and (suspected) exposures hazardous to health: Secondary | ICD-10-CM

## 2016-07-20 DIAGNOSIS — G894 Chronic pain syndrome: Secondary | ICD-10-CM

## 2016-07-20 DIAGNOSIS — I5022 Chronic systolic (congestive) heart failure: Secondary | ICD-10-CM

## 2016-07-20 LAB — COMPREHENSIVE METABOLIC PANEL
ALBUMIN: 2.8 g/dL — AB (ref 3.5–5.0)
ALK PHOS: 58 U/L (ref 38–126)
ALT: 15 U/L — AB (ref 17–63)
AST: 22 U/L (ref 15–41)
Anion gap: 7 (ref 5–15)
BUN: 9 mg/dL (ref 6–20)
CALCIUM: 8.3 mg/dL — AB (ref 8.9–10.3)
CHLORIDE: 98 mmol/L — AB (ref 101–111)
CO2: 25 mmol/L (ref 22–32)
CREATININE: 0.6 mg/dL — AB (ref 0.61–1.24)
GFR calc Af Amer: >60 mL/min (ref >60)
GFR calc non Af Amer: >60 mL/min (ref >60)
GLUCOSE: 100 mg/dL — AB (ref 65–99)
Potassium: 4.1 mmol/L (ref 3.5–5.1)
SODIUM: 130 mmol/L — AB (ref 135–145)
Total Bilirubin: 0.5 mg/dL (ref 0.3–1.2)
Total Protein: 5.1 g/dL — ABNORMAL LOW (ref 6.5–8.1)

## 2016-07-20 LAB — CBC
HCT: 30.7 % — ABNORMAL LOW (ref 39.0–52.0)
HEMOGLOBIN: 10 g/dL — AB (ref 13.0–17.0)
MCH: 29.4 pg (ref 26.0–34.0)
MCHC: 32.6 g/dL (ref 30.0–36.0)
MCV: 90.3 fL (ref 78.0–100.0)
Platelets: 121 10*3/uL — ABNORMAL LOW (ref 150–400)
RBC: 3.4 MIL/uL — AB (ref 4.22–5.81)
RDW: 17.3 % — ABNORMAL HIGH (ref 11.5–15.5)
WBC: 10.5 10*3/uL (ref 4.0–10.5)

## 2016-07-20 LAB — HEPARIN LEVEL (UNFRACTIONATED)
HEPARIN UNFRACTIONATED: 0.29 [IU]/mL — AB (ref 0.30–0.70)
Heparin Unfractionated: 0.49 IU/mL (ref 0.30–0.70)

## 2016-07-20 LAB — MAGNESIUM: Magnesium: 1.8 mg/dL (ref 1.7–2.4)

## 2016-07-20 MED ORDER — LORAZEPAM 2 MG/ML IJ SOLN
2.0000 mg | INTRAMUSCULAR | Status: DC | PRN
  Administered 2016-07-20 – 2016-07-21 (×6): 2 mg via INTRAVENOUS
  Administered 2016-07-21: 3 mg via INTRAVENOUS
  Administered 2016-07-21 – 2016-07-22 (×5): 2 mg via INTRAVENOUS
  Filled 2016-07-20: qty 1
  Filled 2016-07-20: qty 2
  Filled 2016-07-20 (×10): qty 1

## 2016-07-20 NOTE — Progress Notes (Signed)
Pt continues to display confusion, he is not aware of place or situation. He is also complaing about hallucinations in his room. When reoriented by nurse pt stated "I just need a shot of liquor". Nurse paged [facility] on-call and order was place for CWIA protocol. Pt given 2mg  of ativan is resting comfortably, vital signs stable, will continue to monitor pt.

## 2016-07-20 NOTE — Progress Notes (Signed)
       Patient Name: Vincent Dunlap Date of Encounter: 07/20/2016    SUBJECTIVE: He is resting comfortably. He has not complained of chest discomfort since admission. Social situation is concerning. Immobility is concerning. He may not be able to return home after CABG.  TELEMETRY:  Sinus rhythm Vitals:   07/20/16 1200 07/20/16 1208 07/20/16 1300 07/20/16 1400  BP: 101/61 101/61 118/63 112/76  Pulse: 78 78 76 92  Resp: (!) 27 (!) 28 12 19   Temp:  97.7 F (36.5 C)    TempSrc:  Oral    SpO2:  97% 97% 96%  Weight:      Height:        Intake/Output Summary (Last 24 hours) at 07/20/16 1429 Last data filed at 07/20/16 1400  Gross per 24 hour  Intake           791.43 ml  Output             1950 ml  Net         -1158.57 ml   LABS: Basic Metabolic Panel:  Recent Labs  07/19/16 0302 07/20/16 0556  NA 130* 130*  K 3.8 4.1  CL 94* 98*  CO2 28 25  GLUCOSE 101* 100*  BUN 16 9  CREATININE 0.66 0.60*  CALCIUM 8.5* 8.3*  MG 1.4* 1.8   CBC:  Recent Labs  07/19/16 0302 07/20/16 0556  WBC 13.0* 10.5  NEUTROABS 7.4  --   HGB 10.9* 10.0*  HCT 33.7* 30.7*  MCV 90.6 90.3  PLT 148* 121*   Cardiac Enzymes:  Recent Labs  07/17/16 1459 07/17/16 2132  TROPONINI 0.35* 0.50*   BNP: Invalid input(s): POCBNP Hemoglobin A1C: No results for input(s): HGBA1C in the last 72 hours. Fasting Lipid Panel:  Recent Labs  07/17/16 1600  CHOL 151  HDL 67  LDLCALC 77  TRIG 33  CHOLHDL 2.3   Cardiac catheterization: 07/19/16 Diagnostic Diagram       Radiology/Studies:  No new data  Physical Exam: Blood pressure 112/76, pulse 92, temperature 97.7 F (36.5 C), temperature source Oral, resp. rate 19, height 5' 11"  (1.803 m), weight (!) 360 lb 3.7 oz (163.4 kg), SpO2 96 %. Weight change: 202 lb 10.1 oz (91.9 kg)  Wt Readings from Last 3 Encounters:  07/20/16 (!) 360 lb 3.7 oz (163.4 kg)  08/30/12 198 lb 1.6 oz (89.9 kg)   The patient is stable and asleep upon entry of  his room. He is lying flat without evidence of JVD Cardiac exam reveals an apical systolic murmur and an S4 gallop No peripheral edema is noted. The right femoral cath site is unremarkable.  ASSESSMENT:  1. 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 The coronaries are heavily calcified. 2. 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 3. Essential hypertension 4. Immobile and with difficult living situation.  Plan:  1. Heart failure therapy to include beta blocker, and eventual addition of a sore ARB as tolerated by blood pressure. 2. IV heparin and nitroglycerin 3. TCTS evaluation for surgical revascularization. 4. High intensity statin therapy. 5. Case management  Signed, Belva Crome III 07/20/2016, 2:29 PM

## 2016-07-20 NOTE — Progress Notes (Signed)
CSW spoke with patient's friend, Verdene Lennert (listed as emergency contact). She stated that she used to work for patient for several years. Since patient has no family, he asked her to be his primary contact and she helps out whenever she can. She states that patient lives alone and used to receive home health services but she doesn't think anyone has been to the house recently because he was "just sitting for a while". She stated that she will be coming to the hospital.  CSW to continue to follow for needs.  Percell Locus Ioana Louks LCSWA [phone]

## 2016-07-20 NOTE — Progress Notes (Signed)
ANTICOAGULATION CONSULT NOTE - Follow-Up Consult  Pharmacy Consult:  Heparin Indication: chest pain/ACS  Allergies  Allergen Reactions  . Chantix [Varenicline] Other (See Comments)    Pt states it made him crazy    Patient Measurements: Height: 5' 11"  (180.3 cm) Weight: (!) 360 lb 3.7 oz (163.4 kg) IBW/kg (Calculated) : 75.3 Heparin Dosing Weight: 82 kg  Vital Signs: Temp: 97.5 F (36.4 C) (09/12 0735) Temp Source: Oral (09/12 0735) BP: 95/48 (09/12 0800) Pulse Rate: 81 (09/12 0800)  Labs:  Recent Labs  07/17/16 1025 07/17/16 1459  07/17/16 2132  07/18/16 0200 07/19/16 0301 07/19/16 0302 07/20/16 0556  HGB  --   --   --   --   < > 10.0*  --  10.9* 10.0*  HCT  --   --   --   --   --  30.7*  --  33.7* 30.7*  PLT  --   --   --   --   --  161  --  148* PENDING  LABPROT  --   --   --   --   --   --  14.3  --   --   INR  --   --   --   --   --   --  1.10  --   --   HEPARINUNFRC  --   --   < > 0.31  --  0.34 0.42  --  0.29*  CREATININE  --   --   --   --   --  0.70  --  0.66 0.60*  TROPONINI 0.20* 0.35*  --  0.50*  --   --   --   --   --   < > = values in this interval not displayed.  Estimated Creatinine Clearance: 126.6 mL/min (by C-G formula based on SCr of 0.8 mg/dL).   Medical History: Past Medical History:  Diagnosis Date  . Hypertension   . Tobacco abuse       Assessment: 74 yoM started on heparin for ACS presented to the cath lab today 9/11. MD ordered heparin per pharmacy to begin 8-hours post-sheath removal with no bolus. Per cath note, pt with multivessel disease, likely to need surgical consult for further intervention. Previously therapeutic on 1200 units/hr, restarted at 2000 (sheath removal was 1200) with no bolus. AM heparin level slightly subtherapeutic at 0.29. No issues noted per RN, CBC stable.  Goal of Therapy:  Heparin level 0.3-0.7 units/ml Monitor platelets by anticoagulation protocol: Yes    Plan:  -Increase heparin to 1350  units/hr -Obtain 8-hr level at 1630. -Daily HL, CBC -Follow-up further plans for surgical intervention  Arrie Senate, PharmD PGY-1 Pharmacy Resident Pager: [phone] 07/20/2016

## 2016-07-20 NOTE — Progress Notes (Signed)
07/20/16  Pharmacy-  Heparin 1730   Heparin level 0.49   A/P:  75yo male continued on heparin post-cath with plans for surgical evaluation by TCTS.  Heparin level is therapeutic on current rate, s/p adjustment this AM and will continue this rate.  Continue to monitor for s/s of bleeding.    Gracy Bruins, PharmD Clinical Pharmacist [facility]

## 2016-07-20 NOTE — Care Management Important Message (Signed)
Important Message  Patient Details  Name: Vincent Dunlap MRN: [id number] Date of Birth: 1941-05-11   Medicare Important Message Given:  Yes    Lacretia Leigh, RN 07/20/2016, 10:16 AM

## 2016-07-20 NOTE — Progress Notes (Signed)
PROGRESS NOTE    Vincent Dunlap  DXI:[id number] DOB: 1941/04/14 DOA: 07/17/2016 PCP: Thressa Sheller, MD   Brief Narrative:  75 y.o. male PMHx HTN and Tobacco Abuse,Chronic Pain (on oxycontine),  Gout   Patient presents to the emergency department with a two-week history of progressive dyspnea, worse with even minimal exertion. EMS found patient to be hypoxic, placed on CPAP.  Denies orthopnea.  No cough. No known fevers. No history of pneumonia nor heart failure. Patient is compliant with blood pressure medications at home. He doesn't add salt to food but does consume a lot of canned foods. He reports recent onset of lower extremity edema. He is dizzy with walking and standing.     Subjective: 9/12  A/O 3 (does not know where), states having waxing and waning CP. Pain located under left breast   Assessment & Plan:   Principal Problem:   Acute respiratory failure with hypoxia (HCC) Active Problems:   Dyspnea   Hypertension   Elevated lactic acid level   Sinus tachycardia (HCC)   Dizziness   Coronary artery disease due to lipid rich plaque   NSTEMI (non-ST elevated myocardial infarction) (HCC)   Pressure ulcer   Acute hypoxic respiratory failure due to acute exacerbation of systolic CHF -EF 76-73% via TTE this admit  - no ACEi/ARB due to hypotension -Strict in and out -Daily weigh -9/11 right/left heart cath: Multivessel disease requires CABG see results below  Non-STEMI -Unable to tolerate IV nitro due to hypotension (remains relatively hypotensive)   Chronic pain syndrome -on chronic oxycodone - holding at present due to hypotension  - no evidence of uncontrolled pain   Hypotension -care with blood pressure medications and diuresis   Hypomagnesemia -Replace and follow  Chronic thrombocytopenia -Baseline appears to be anywhere between 60 and 180 - stable at present - followed by hematology as outpatient - felt to be due to splenomegaly as well as prior history  of alcohol abuse  Gout / Osteoarthritis     DVT prophylaxis: IV heparin Code Status: Full code Family Communication: None Disposition Plan: Per cardiothoracic surgery   Consultants:  [facility] Cards   Procedures/Significant Events:  TTE 9/9 9/11 Right/Left heart cath:Severe ischemic cardiomyopathy with diffuse hypocontractility and hypo-to akinesis in the mid- basal inferior wall with a global ejection fraction of of 25-30%.  Severe multivessel CAD with 70% distal left main stenosis, 70% proximal LAD, diagonal 1 and diagonal 2 stenoses with 90% septal perforating artery stenoses; 75% proximal left circumflex stenoses, and total occlusion of the very proximal RCA with significant left to right collaterals.  Cultures   Antimicrobials: Zosyn 9/9>> Vancomycin 9/9>>   Devices    LINES / TUBES:      Continuous Infusions: . sodium chloride Stopped (07/19/16 1813)  . heparin 1,350 Units/hr (07/20/16 0823)  . nitroGLYCERIN Stopped (07/18/16 1649)     Objective: Vitals:   07/20/16 0600 07/20/16 0700 07/20/16 0735 07/20/16 0800  BP: (!) 101/54 94/64 101/63 (!) 95/48  Pulse:  87 81 81  Resp: (!) 21 17 16 16   Temp:   97.5 F (36.4 C)   TempSrc:   Oral   SpO2:  96% 97% 94%  Weight:      Height:        Intake/Output Summary (Last 24 hours) at 07/20/16 0901 Last data filed at 07/20/16 0800  Gross per 24 hour  Intake           564.75 ml  Output  1800 ml  Net         -1235.25 ml   Filed Weights   07/18/16 0403 07/19/16 0400 07/20/16 0406  Weight: 73 kg (161 lb) 71.5 kg (157 lb 9.6 oz) (!) 163.4 kg (360 lb 3.7 oz)    Examination:  General: A/O 3 (does not know where), states having waxing and waning CP, No acute respiratory distress Eyes: negative scleral hemorrhage, negative anisocoria, negative icterus ENT: Negative Runny nose, negative gingival bleeding, Neck:  Negative scars, masses, torticollis, lymphadenopathy, JVD Lungs: Clear to auscultation  bilaterally without wheezes or crackles Cardiovascular: Regular rate and rhythm without murmur gallop or rub normal S1 and S2 Abdomen: negative abdominal pain, nondistended, positive soft, bowel sounds, no rebound, no ascites, no appreciable mass Extremities: No significant cyanosis, clubbing, or edema bilateral lower extremities Skin: Negative rashes, lesions, ulcers Psychiatric:  Negative depression, negative anxiety, negative fatigue, negative mania  Central nervous system:  Cranial nerves II through XII intact, tongue/uvula midline, all extremities muscle strength 5/5, sensation intact throughout, negative dysarthria, negative expressive aphasia, negative receptive aphasia.  .     Data Reviewed: Care during the described time interval was provided by me .  I have reviewed this patient's available data, including medical history, events of note, physical examination, and all test results as part of my evaluation. I have personally reviewed and interpreted all radiology studies.  CBC:  Recent Labs Lab 07/17/16 0705 07/18/16 0200 07/19/16 0302 07/20/16 0556  WBC 14.8* 9.0 13.0* 10.5  NEUTROABS 11.8*  --  7.4  --   HGB 12.1* 10.0* 10.9* 10.0*  HCT 36.8* 30.7* 33.7* 30.7*  MCV 90.0 89.0 90.6 90.3  PLT 180 161 148* 782*   Basic Metabolic Panel:  Recent Labs Lab 07/17/16 0705 07/18/16 0200 07/19/16 0302 07/20/16 0556  NA 137 132* 130* 130*  K 3.5 3.4* 3.8 4.1  CL 98* 95* 94* 98*  CO2 24 28 28 25   GLUCOSE 156* 97 101* 100*  BUN 9 12 16 9   CREATININE 0.69 0.70 0.66 0.60*  CALCIUM 9.0 8.3* 8.5* 8.3*  MG  --   --  1.4*  --    GFR: Estimated Creatinine Clearance: 126.6 mL/min (by C-G formula based on SCr of 0.8 mg/dL). Liver Function Tests:  Recent Labs Lab 07/17/16 0705 07/19/16 0302 07/20/16 0556  AST 38 27 22  ALT 19 15* 15*  ALKPHOS 79 65 58  BILITOT 1.0 0.6 0.5  PROT 6.3* 5.5* 5.1*  ALBUMIN 3.4* 3.2* 2.8*   No results for input(s): LIPASE, AMYLASE in the  last 168 hours. No results for input(s): AMMONIA in the last 168 hours. Coagulation Profile:  Recent Labs Lab 07/19/16 0301  INR 1.10   Cardiac Enzymes:  Recent Labs Lab 07/17/16 1025 07/17/16 1459 07/17/16 2132  TROPONINI 0.20* 0.35* 0.50*   BNP (last 3 results) No results for input(s): PROBNP in the last 8760 hours. HbA1C: No results for input(s): HGBA1C in the last 72 hours. CBG:  Recent Labs Lab 07/19/16 2028  GLUCAP 143*   Lipid Profile:  Recent Labs  07/17/16 1600  CHOL 151  HDL 67  LDLCALC 77  TRIG 33  CHOLHDL 2.3   Thyroid Function Tests:  Recent Labs  07/17/16 1025  TSH 0.618   Anemia Panel: No results for input(s): VITAMINB12, FOLATE, FERRITIN, TIBC, IRON, RETICCTPCT in the last 72 hours. Urine analysis:    Component Value Date/Time   COLORURINE YELLOW 07/17/2016 1107   APPEARANCEUR CLEAR 07/17/2016 1107   LABSPEC  1.016 07/17/2016 1107   PHURINE 5.5 07/17/2016 1107   GLUCOSEU NEGATIVE 07/17/2016 1107   HGBUR NEGATIVE 07/17/2016 1107   BILIRUBINUR NEGATIVE 07/17/2016 1107   KETONESUR 15 (A) 07/17/2016 1107   PROTEINUR NEGATIVE 07/17/2016 1107   UROBILINOGEN 1.0 07/29/2009 0002   NITRITE NEGATIVE 07/17/2016 1107   LEUKOCYTESUR NEGATIVE 07/17/2016 1107   Sepsis Labs: @LABRCNTIP (procalcitonin:4,lacticidven:4)  ) Recent Results (from the past 240 hour(s))  Culture, blood (Routine X 2) w Reflex to ID Panel     Status: None (Preliminary result)   Collection Time: 07/17/16  8:30 AM  Result Value Ref Range Status   Specimen Description BLOOD RIGHT ANTECUBITAL  Final   Special Requests BOTTLES DRAWN AEROBIC AND ANAEROBIC 5CC  Final   Culture NO GROWTH 2 DAYS  Final   Report Status PENDING  Incomplete  Culture, blood (Routine X 2) w Reflex to ID Panel     Status: None (Preliminary result)   Collection Time: 07/17/16  8:40 AM  Result Value Ref Range Status   Specimen Description BLOOD LEFT HAND  Final   Special Requests BOTTLES DRAWN  AEROBIC AND ANAEROBIC 5CC  Final   Culture NO GROWTH 2 DAYS  Final   Report Status PENDING  Incomplete  MRSA PCR Screening     Status: None   Collection Time: 07/17/16 12:34 PM  Result Value Ref Range Status   MRSA by PCR NEGATIVE NEGATIVE Final    Comment:        The GeneXpert MRSA Assay (FDA approved for NASAL specimens only), is one component of a comprehensive MRSA colonization surveillance program. It is not intended to diagnose MRSA infection nor to guide or monitor treatment for MRSA infections.          Radiology Studies: No results found.      Scheduled Meds: . aspirin  81 mg Oral Daily  . atorvastatin  80 mg Oral q1800  . carvedilol  3.125 mg Oral BID WC  . mouth rinse  15 mL Mouth Rinse BID  . sodium chloride flush  3 mL Intravenous Q12H   Continuous Infusions: . sodium chloride Stopped (07/19/16 1813)  . heparin 1,350 Units/hr (07/20/16 0823)  . nitroGLYCERIN Stopped (07/18/16 1649)     LOS: 3 days    Time spent: 40 minutes    Mckenleigh Tarlton, Geraldo Docker, MD [facility] Pager [phone]   If 7PM-7AM, please contact night-coverage [email or web address] Password [email or web address] 07/20/2016, 9:01 AM

## 2016-07-20 NOTE — Progress Notes (Signed)
Spoke in person with patient contact person, Verdene Lennert, who used to be his aide, she stated she has not been with patient for 3 months, she is unsure if someone is seeing him at the house she said possibly someone from [facility]?, she states patient drinks all day everyday and is very immobile,Will report to next nurse to relay message to case management tomorrow.

## 2016-07-21 DIAGNOSIS — I5021 Acute systolic (congestive) heart failure: Secondary | ICD-10-CM

## 2016-07-21 DIAGNOSIS — I25110 Atherosclerotic heart disease of native coronary artery with unstable angina pectoris: Secondary | ICD-10-CM

## 2016-07-21 LAB — HEPARIN LEVEL (UNFRACTIONATED): Heparin Unfractionated: 0.47 IU/mL (ref 0.30–0.70)

## 2016-07-21 LAB — CBC
HEMATOCRIT: 29.8 % — AB (ref 39.0–52.0)
HEMOGLOBIN: 10 g/dL — AB (ref 13.0–17.0)
MCH: 29.9 pg (ref 26.0–34.0)
MCHC: 33.6 g/dL (ref 30.0–36.0)
MCV: 89.2 fL (ref 78.0–100.0)
Platelets: 116 10*3/uL — ABNORMAL LOW (ref 150–400)
RBC: 3.34 MIL/uL — AB (ref 4.22–5.81)
RDW: 17.2 % — ABNORMAL HIGH (ref 11.5–15.5)
WBC: 11.3 10*3/uL — ABNORMAL HIGH (ref 4.0–10.5)

## 2016-07-21 MED ORDER — FOLIC ACID 1 MG PO TABS
1.0000 mg | ORAL_TABLET | Freq: Every day | ORAL | Status: DC
  Administered 2016-07-21 – 2016-07-25 (×5): 1 mg via ORAL
  Filled 2016-07-21 (×5): qty 1

## 2016-07-21 MED ORDER — VITAMIN B-1 100 MG PO TABS
100.0000 mg | ORAL_TABLET | Freq: Every day | ORAL | Status: DC
  Administered 2016-07-21 – 2016-07-25 (×5): 100 mg via ORAL
  Filled 2016-07-21 (×5): qty 1

## 2016-07-21 MED ORDER — CARVEDILOL 6.25 MG PO TABS
6.2500 mg | ORAL_TABLET | Freq: Two times a day (BID) | ORAL | Status: DC
  Administered 2016-07-21 – 2016-07-25 (×8): 6.25 mg via ORAL
  Filled 2016-07-21 (×8): qty 1

## 2016-07-21 MED ORDER — LISINOPRIL 2.5 MG PO TABS
2.5000 mg | ORAL_TABLET | Freq: Every day | ORAL | Status: DC
  Administered 2016-07-21 – 2016-07-25 (×5): 2.5 mg via ORAL
  Filled 2016-07-21 (×5): qty 1

## 2016-07-21 MED ORDER — ADULT MULTIVITAMIN W/MINERALS CH
1.0000 | ORAL_TABLET | Freq: Every day | ORAL | Status: DC
  Administered 2016-07-21 – 2016-07-25 (×5): 1 via ORAL
  Filled 2016-07-21 (×5): qty 1

## 2016-07-21 MED ORDER — SPIRONOLACTONE 25 MG PO TABS
12.5000 mg | ORAL_TABLET | Freq: Every day | ORAL | Status: DC
Start: 2016-07-21 — End: 2016-07-25
  Administered 2016-07-21 – 2016-07-25 (×5): 12.5 mg via ORAL
  Filled 2016-07-21 (×5): qty 1

## 2016-07-21 NOTE — Discharge Summary (Deleted)
[address]       [place] [address]             [phone]        Vincent Dunlap [facility] Medical Record [id number] Date of Birth: [birth year]  Referring: No ref. provider found Primary Care: Thressa Sheller, MD  Chief Complaint:    Chief Complaint  Patient presents with  . Shortness of Breath, chest pain    History of Present Illness:     This is a 75 y.o. male with a medical history significant for, but not  limited to, hypertension and tobacco abuse. Patient presents to the emergency department with a two-week history of progressive dyspnea, worse with even minimal exertion. EMS found patient to be hypoxic, placed on CPAP.   He is admitted with a two-week history of progressive shortness of breath. He eats lots of salty foods and eat canned foods on a regular basis. His chest pain appears to be consistent with congestive heart failure. His troponin I level was elevated to 0.50. He ruled in for a NSTEMI.  It should be noted that he's had intermittent chest pains in the past. He had lots of chest pain several weeks ago. The chest pain radiated through to his neck and back. He also describes some left arm pain and weakness. He was so short of breath that he apparently quit smoking 2 weeks ago.  He underwent a cardiac catheterization on 07/19/2016 by Dr. Claiborne Billings. 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 A cardiothoracic consultation was obtained with Dr. Servando Snare for the consideration of coronary artery bypass grafting surgery. He currently denies chest pain or shortness of breath. He is on a Heparin drip and vital signs are stable.    Current Activity/ Functional Status: Patient is not independent with mobility/ambulation, transfers, ADL's, IADL's.   Zubrod Score: At the time of surgery this patient's most appropriate activity status/level should be described as: []     0    Normal activity, no  symptoms []     1    Restricted in physical strenuous activity but ambulatory, able to do out light work []     2    Ambulatory and capable of self care, unable to do work activities, up and about                 more than 50%  Of the time                            [x]     3    Only limited self care, in bed greater than 50% of waking hours []     4    Completely disabled, no self care, confined to bed or chair []     5    Moribund  Past Medical History:  Diagnosis Date  . Hypertension   . Tobacco abuse     Past Surgical History:  Procedure Laterality Date  . CARDIAC CATHETERIZATION N/A 07/19/2016   Procedure: Right/Left Heart Cath and Coronary Angiography;  Surgeon: Troy Sine, MD;  Location: [facility];  Service: Cardiovascular;  Laterality: N/A;  . INGUINAL HERNIA REPAIR Bilateral     Social History  Smoking Status  . Current Some Day Smoker  Smokeless Tobacco  . Former Systems developer    Alcohol Use  . Yes-likely abuse   Social History  .  Marital status: Single    Spouse name: N/A  . Number of children: N/A  . Years of education: N/A    Allergies  Allergen Reactions  . Chantix [Varenicline] Other (See Comments)    Pt states it made him crazy    Current Facility-Administered Medications  Medication Dose Route Frequency Provider Last Rate Last Dose  . 0.9 %  sodium chloride infusion  250 mL Intravenous PRN Troy Sine, MD      . 0.9 %  sodium chloride infusion   Intravenous Continuous Troy Sine, MD   Stopped at 07/19/16 1813  . acetaminophen (TYLENOL) tablet 650 mg  650 mg Oral Q4H PRN Willia Craze, NP   650 mg at 07/17/16 1313  . acetaminophen (TYLENOL) tablet 650 mg  650 mg Oral Q4H PRN Troy Sine, MD      . aspirin chewable tablet 81 mg  81 mg Oral Daily Cherene Altes, MD   81 mg at 07/21/16 1128  . atorvastatin (LIPITOR) tablet 80 mg  80 mg Oral q1800 Troy Sine, MD   80 mg at 07/20/16 1615  . carvedilol (COREG) tablet 6.25 mg  6.25 mg Oral  BID WC Belva Crome, MD      . diazepam (VALIUM) tablet 5 mg  5 mg Oral Q4H PRN Troy Sine, MD      . heparin ADULT infusion 100 units/mL (25000 units/223mL sodium chloride 0.45%)  1,350 Units/hr Intravenous Continuous Einar Grad, RPH 13.5 mL/hr at 07/20/16 2126 1,350 Units/hr at 07/20/16 2126  . lisinopril (PRINIVIL,ZESTRIL) tablet 2.5 mg  2.5 mg Oral Daily Belva Crome, MD   2.5 mg at 07/21/16 1127  . LORazepam (ATIVAN) injection 2-3 mg  2-3 mg Intravenous Q1H PRN Jeryl Columbia, NP   2 mg at 07/21/16 0436  . MEDLINE mouth rinse  15 mL Mouth Rinse BID Kinnie Feil, MD   15 mL at 07/20/16 2127  . nitroGLYCERIN (NITROSTAT) SL tablet 0.4 mg  0.4 mg Sublingual Q5 min PRN Almyra Deforest, PA   0.4 mg at 07/19/16 1740  . nitroGLYCERIN 50 mg in dextrose 5 % 250 mL (0.2 mg/mL) infusion  0-200 mcg/min Intravenous Titrated Thayer Headings, MD   Stopped at 07/21/16 0845  . ondansetron (ZOFRAN) injection 4 mg  4 mg Intravenous Q6H PRN Troy Sine, MD      . sodium chloride flush (NS) 0.9 % injection 3 mL  3 mL Intravenous Q12H Troy Sine, MD   3 mL at 07/20/16 2127  . sodium chloride flush (NS) 0.9 % injection 3 mL  3 mL Intravenous PRN Troy Sine, MD      . spironolactone (ALDACTONE) tablet 12.5 mg  12.5 mg Oral Daily Belva Crome, MD   12.5 mg at 07/21/16 1127  . traMADol (ULTRAM) tablet 50 mg  50 mg Oral Q6H PRN Lavina Hamman, MD   50 mg at 07/21/16 0329    Prescriptions Prior to Admission  Medication Sig Dispense Refill Last Dose  . amitriptyline (ELAVIL) 25 MG tablet Take 25 mg by mouth at bedtime.   Past Week at Unknown time  . amLODipine-benazepril (LOTREL) 5-20 MG per capsule Take 1 capsule by mouth daily.   Past Week at Unknown time  . esomeprazole (NEXIUM) 20 MG capsule Take 20 mg by mouth daily as needed.   Past Week at Unknown time  . oxyCODONE-acetaminophen (PERCOCET) 7.5-325 MG tablet Take 1 tablet by mouth  every 6 (six) hours as needed.   07/16/2016 at Unknown time    . OXYCONTIN 20 MG 12 hr tablet Take 1 tablet by mouth 2 (two) times daily.   07/16/2016 at Unknown time    Family History  Problem Relation Age of Onset  . Alzheimer's disease Mother    Review of Systems:  Negative except as stated in HPI   Physical Exam: BP 124/75 (BP Location: Right Arm)   Pulse 89   Temp 97.7 F (36.5 C) (Oral)   Resp 18   Ht 5\' 11"  (1.803 m)   Wt 158 lb (71.7 kg)   SpO2 100%   BMI 22.04 kg/m    General appearance: No distress Head: Normocephalic, without obvious abnormality, atraumatic Neck: no carotid bruit, no JVD and supple, symmetrical, trachea midline Resp: Clear to auscultation bilaterally Cardio: RRR Extremities: No LE edema. Has bandages for heel sores bilaterally. Right heel is in protective boot. Neurologic: Hard of hearing;Grossly intact without focal deficits  Diagnostic Studies & Laboratory data:    Right/Left Heart Cath and Coronary Angiography by Dr. Claiborne Billings on 07/19/2016:  Conclusion     Ost RCA lesion, 95 %stenosed.  Prox RCA lesion, 100 %stenosed.  Ost LM to LM lesion, 70 %stenosed.  Ost Cx to Prox Cx lesion, 75 %stenosed.  Ost LAD to Prox LAD lesion, 70 %stenosed.  Ost 1st Diag to 1st Diag lesion, 70 %stenosed.  Ost 2nd Diag to 2nd Diag lesion, 70 %stenosed.  Ost 1st Sept to 1st Sept lesion, 90 %stenosed.  Hemodynamic findings consistent with mild pulmonary hypertension.   Severe ischemic cardiomyopathy with diffuse hypocontractility and hypo-to akinesis in the mid- basal inferior wall with a global ejection fraction of of 25-30%.  Severe multivessel CAD with 70% distal left main stenosis, 70% proximal LAD, diagonal 1 and diagonal 2 stenoses with 90% septal perforating artery stenoses; 75% proximal left circumflex stenoses, and total occlusion of the very proximal RCA with significant left to right collaterals.  RECOMMENDATION: Surgical consultation for CABG revascularization surgery.      Recent Radiology  Findings:   CLINICAL DATA:  Dyspnea  EXAM: PORTABLE CHEST 1 VIEW  COMPARISON:  07/17/2016  FINDINGS: 0523 hours. Cardiopericardial silhouette is at upper limits of normal for size. Pulmonary vascular congestion with bilateral interstitial and basilar airspace disease suggests edema, minimally improved in the interval. Bones are diffusely demineralized. Telemetry leads overlie the chest.  IMPRESSION: Interval improvement in lung aeration suggesting improving edema.   Electronically Signed   By: Misty Stanley M.D.   On: 07/18/2016 07:22   I have independently reviewed the above radiologic studies.  Recent Lab Findings: Lab Results  Component Value Date   WBC 11.3 (H) 07/21/2016   HGB 10.0 (L) 07/21/2016   HCT 29.8 (L) 07/21/2016   PLT 116 (L) 07/21/2016   GLUCOSE 100 (H) 07/20/2016   CHOL 151 07/17/2016   TRIG 33 07/17/2016   HDL 67 07/17/2016   LDLCALC 77 07/17/2016   ALT 15 (L) 07/20/2016   AST 22 07/20/2016   NA 130 (L) 07/20/2016   K 4.1 07/20/2016   CL 98 (L) 07/20/2016   CREATININE 0.60 (L) 07/20/2016   BUN 9 07/20/2016   CO2 25 07/20/2016   TSH 0.618 07/17/2016   INR 1.10 07/19/2016   Assessment / Plan:   1. S/p NSTEMI, , ischemic cardiomyopathy, multivessel CAD-Dr. Servando Snare to determine if candidate for coronary artery bypass grafting surgery, although unlikely as very limited mobility and likely borderline lung function.  On heparin drip. 2. Acute on chronic systolic congestive heart failure-on Spironolactone 12.5 mg daily 3. Hypertension-on Coreg 6.25 mg bid and Lisinopril 2.5 mg daily 3. Thrombocytopenia-latest platelet count 116,000 4. Anemia-latest H and H 10 and 29.8 5. Hyperlipidemia-Atorvastatin 80 mg at hs  I  spent 10 minutes counseling the patient face to face and 30% or more the  time was spent in counseling and coordination of care. The total time spent in the appointment was 60 minutes.    Vincent Pinks PA-C 07/21/2016 12:04  PM

## 2016-07-21 NOTE — Progress Notes (Signed)
ANTICOAGULATION CONSULT NOTE - Follow-Up Consult  Pharmacy Consult:  Heparin Indication: chest pain/ACS  Allergies  Allergen Reactions  . Chantix [Varenicline] Other (See Comments)    Pt states it made him crazy    Patient Measurements: Height: 5\' 11"  (180.3 cm) Weight: 158 lb (71.7 kg) IBW/kg (Calculated) : 75.3 Heparin Dosing Weight: 82 kg  Vital Signs: Temp: 98 F (36.7 C) (09/13 0400) Temp Source: Oral (09/13 0400) BP: 125/113 (09/13 0600) Pulse Rate: 94 (09/13 0500)  Labs:  Recent Labs  07/19/16 0301  07/19/16 0302 07/20/16 0556 07/20/16 1621 07/21/16 0222  HGB  --   < > 10.9* 10.0*  --  10.0*  HCT  --   --  33.7* 30.7*  --  29.8*  PLT  --   --  148* 121*  --  116*  LABPROT 14.3  --   --   --   --   --   INR 1.10  --   --   --   --   --   HEPARINUNFRC 0.42  --   --  0.29* 0.49 0.47  CREATININE  --   --  0.66 0.60*  --   --   < > = values in this interval not displayed.  Estimated Creatinine Clearance: 82.2 mL/min (by C-G formula based on SCr of 0.6 mg/dL (L)).   Medical History: Past Medical History:  Diagnosis Date  . Hypertension   . Tobacco abuse       Assessment: 74 yoM started on heparin for ACS presented to the cath lab today 9/11. MD ordered heparin per pharmacy to begin 8-hours post-sheath removal with no bolus. Per cath note, pt with multivessel disease, likely to need surgical consult for further intervention. Heparin level therapeutic x2 at 0.47 this morning. CBC wnl, no S/Sx bleeding noted.  Goal of Therapy:  Heparin level 0.3-0.7 units/ml Monitor platelets by anticoagulation protocol: Yes    Plan:  -Continue heparin at 1350 units/hr -Daily HL, CBC, S/Sx bleeding -Follow-up further plans for surgical intervention  Arrie Senate, PharmD PGY-1 Pharmacy Resident Pager: [phone] 07/21/2016

## 2016-07-21 NOTE — Progress Notes (Addendum)
       Patient Name: Vincent Dunlap Date of Encounter: 07/21/2016    SUBJECTIVE: Unfortunately, [facility] was never notified of the need for consultation concerning bypass surgery. I called them this a.m. , spoke with Thurmond Butts, and it is now confirmed that he will be seen.  No chest discomfort. Breathing is improved. Has been a heavy drinker in the past. Has a helper that comes in 2.5 half hours each day to help him manage his affairs at home. He is basically immobile. He has been a heavy drinker in the past.  He has chronic pain syndrome, continued tobacco abuse, hypertension, and alcohol abuse.  TELEMETRY  NSR Vitals:   07/21/16 0500 07/21/16 0529 07/21/16 0600 07/21/16 0810  BP: 123/70 129/90 (!) 125/113 121/87  Pulse: 94   98  Resp: 15 19 18 18   Temp:    97.1 F (36.2 C)  TempSrc:    Oral  SpO2: 100%     Weight:      Height:        Intake/Output Summary (Last 24 hours) at 07/21/16 0945 Last data filed at 07/21/16 0600  Gross per 24 hour  Intake           285.18 ml  Output             1625 ml  Net         -1339.82 ml   LABS: Basic Metabolic Panel:  Recent Labs  07/19/16 0302 07/20/16 0556  NA 130* 130*  K 3.8 4.1  CL 94* 98*  CO2 28 25  GLUCOSE 101* 100*  BUN 16 9  CREATININE 0.66 0.60*  CALCIUM 8.5* 8.3*  MG 1.4* 1.8   CBC:  Recent Labs  07/19/16 0302 07/20/16 0556 07/21/16 0222  WBC 13.0* 10.5 11.3*  NEUTROABS 7.4  --   --   HGB 10.9* 10.0* 10.0*  HCT 33.7* 30.7* 29.8*  MCV 90.6 90.3 89.2  PLT 148* 121* 116*     Radiology/Studies:  No new data  Physical Exam: Blood pressure 121/87, pulse 98, temperature 97.1 F (36.2 C), temperature source Oral, resp. rate 18, height 5' 11"  (1.803 m), weight 158 lb (71.7 kg), SpO2 100 %. Weight change: -5 lb 6.4 oz (-2.449 kg)  Wt Readings from Last 3 Encounters:  07/21/16 158 lb (71.7 kg)  08/30/12 198 lb 1.6 oz (89.9 kg)   Lying flat and [place] in bed. Soft gallop is heard. Apical systolic murmurs  heard. Chest is clear to auscultation and percussion.  Extremities reveal no edema.   ASSESSMENT:  1. Multivessel coronary disease including distal left main demonstrated on cardiac catheterization. 2. Chronic ischemic cardiomyopathy with acute on chronic systolic heart failure presentation, now clinically improved with therapy. 3. Essential hypertension with good control 4. Tobacco and alcohol abuse 5. Immobility  Plan:  1. PT consult 2. Titrate heart failure and anti-ischemic therapy as tolerated by blood pressure. This may end up being active follow up therapy if he is not a surgical candidate. 3. [facility] was never notified of consult. I placed a formal request this AM.   Signed, Belva Crome III 07/21/2016, 9:45 AM

## 2016-07-21 NOTE — Consult Note (Signed)
[address]       [place] [address]             [phone]                               Milas W Basham [place] Medical Record [id number] Date of Birth: 10-Dec-1940  Referring: Dr Tamala Julian Primary Care: Thressa Sheller, MD  Chief Complaint:       Chief Complaint  Patient presents with  . Shortness of Breath, chest pain, S/p NSTMI with multivessel CAD    History of Present Illness:     This is a 75 y.o.malewith a medical history significant for, but not limited to, hypertension and tobacco abuse. Patient presented  to the emergency department with a two-week history of progressive dyspnea, worse with even minimal exertion. EMS found patient to be hypoxic, placed on CPAP.  He is admitted with a two-week history of progressive shortness of breath. He eats lots of salty foods and eat canned foods on a regular basis. His chest pain appears to be consistent with congestive heart failure. His troponin I level was elevated to 0.50. He ruled in for a NSTEMI.  It should be noted that he's had intermittent chest pains in the past. He had lots of chest pain several weeks ago. The chest pain radiated through to his neck and back. He also describes some left arm pain and weakness. He was so short of breath that he apparently quit smoking 2 weeks ago.  He lives alone and it appears in unclean living conditions. He tells me he has required home nursing care for 10 years   He underwent a cardiac catheterization on 07/19/2016 by Dr. Claiborne Billings. 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 A cardiothoracic consultation was requested today for the consideration of coronary artery bypass grafting surgery. He currently denies chest pain or shortness of breath. He is on a Heparin drip and vital signs are stable.   Patient is very poor at giving any history, confused to time and place and situation.  Current Activity/  Functional Status: Patient is not independent with mobility/ambulation, transfers, ADL's, IADL's.   Zubrod Score: At the time of surgery this patient's most appropriate activity status/level should be described as: []     0    Normal activity, no symptoms []     1    Restricted in physical strenuous activity but ambulatory, able to do out light work []     2    Ambulatory and capable of self care, unable to do work activities, up and about                 more than 50%  Of the time                            []     3    Only limited self care, in bed greater than 50% of waking hours [x]     4    Completely disabled, no self care, confined to bed or chair []     5    Moribund      Past Medical History:  Diagnosis Date  . Hypertension   . Tobacco abuse          Past Surgical History:  Procedure Laterality Date  . CARDIAC CATHETERIZATION  N/A 07/19/2016   Procedure: Right/Left Heart Cath and Coronary Angiography;  Surgeon: Troy Sine, MD;  Location: [facility];  Service: Cardiovascular;  Laterality: N/A;  . INGUINAL HERNIA REPAIR Bilateral        Social History  Smoking Status  . Current Some Day Smoker  Smokeless Tobacco  . Former Systems developer       Alcohol Use  . Yes-likely abuse        Social History  . Marital status: Single    Spouse name: N/A  . Number of children: N/A  . Years of education: N/A         Allergies  Allergen Reactions  . Chantix [Varenicline] Other (See Comments)    Pt states it made him crazy             Current Facility-Administered Medications  Medication Dose Route Frequency Provider Last Rate Last Dose  . 0.9 %  sodium chloride infusion  250 mL Intravenous PRN Troy Sine, MD      . 0.9 %  sodium chloride infusion   Intravenous Continuous Troy Sine, MD   Stopped at 07/19/16 1813  . acetaminophen (TYLENOL) tablet 650 mg  650 mg Oral Q4H PRN Willia Craze, NP   650 mg at 07/17/16 1313  . acetaminophen (TYLENOL)  tablet 650 mg  650 mg Oral Q4H PRN Troy Sine, MD      . aspirin chewable tablet 81 mg  81 mg Oral Daily Cherene Altes, MD   81 mg at 07/21/16 1128  . atorvastatin (LIPITOR) tablet 80 mg  80 mg Oral q1800 Troy Sine, MD   80 mg at 07/20/16 1615  . carvedilol (COREG) tablet 6.25 mg  6.25 mg Oral BID WC Belva Crome, MD      . diazepam (VALIUM) tablet 5 mg  5 mg Oral Q4H PRN Troy Sine, MD      . heparin ADULT infusion 100 units/mL (25000 units/26mL sodium chloride 0.45%)  1,350 Units/hr Intravenous Continuous Einar Grad, RPH 13.5 mL/hr at 07/20/16 2126 1,350 Units/hr at 07/20/16 2126  . lisinopril (PRINIVIL,ZESTRIL) tablet 2.5 mg  2.5 mg Oral Daily Belva Crome, MD   2.5 mg at 07/21/16 1127  . LORazepam (ATIVAN) injection 2-3 mg  2-3 mg Intravenous Q1H PRN Jeryl Columbia, NP   2 mg at 07/21/16 0436  . MEDLINE mouth rinse  15 mL Mouth Rinse BID Kinnie Feil, MD   15 mL at 07/20/16 2127  . nitroGLYCERIN (NITROSTAT) SL tablet 0.4 mg  0.4 mg Sublingual Q5 min PRN Almyra Deforest, PA   0.4 mg at 07/19/16 1740  . nitroGLYCERIN 50 mg in dextrose 5 % 250 mL (0.2 mg/mL) infusion  0-200 mcg/min Intravenous Titrated Thayer Headings, MD   Stopped at 07/21/16 0845  . ondansetron (ZOFRAN) injection 4 mg  4 mg Intravenous Q6H PRN Troy Sine, MD      . sodium chloride flush (NS) 0.9 % injection 3 mL  3 mL Intravenous Q12H Troy Sine, MD   3 mL at 07/20/16 2127  . sodium chloride flush (NS) 0.9 % injection 3 mL  3 mL Intravenous PRN Troy Sine, MD      . spironolactone (ALDACTONE) tablet 12.5 mg  12.5 mg Oral Daily Belva Crome, MD   12.5 mg at 07/21/16 1127  . traMADol (ULTRAM) tablet 50 mg  50 mg Oral Q6H PRN Lavina Hamman, MD  50 mg at 07/21/16 0329           Prescriptions Prior to Admission  Medication Sig Dispense Refill Last Dose  . amitriptyline (ELAVIL) 25 MG tablet Take 25 mg by mouth at bedtime.   Past Week at Unknown time  . amLODipine-benazepril (LOTREL)  5-20 MG per capsule Take 1 capsule by mouth daily.   Past Week at Unknown time  . esomeprazole (NEXIUM) 20 MG capsule Take 20 mg by mouth daily as needed.   Past Week at Unknown time  . oxyCODONE-acetaminophen (PERCOCET) 7.5-325 MG tablet Take 1 tablet by mouth every 6 (six) hours as needed.   07/16/2016 at Unknown time  . OXYCONTIN 20 MG 12 hr tablet Take 1 tablet by mouth 2 (two) times daily.   07/16/2016 at Unknown time         Family History  Problem Relation Age of Onset  . Alzheimer's disease Mother    Review of Systems:  Negative except as stated in HPI   Physical Exam: BP 124/75 (BP Location: Right Arm)   Pulse 89   Temp 97.7 F (36.5 C) (Oral)   Resp 18   Ht 5\' 11"  (1.803 m)   Wt 158 lb (71.7 kg)   SpO2 100%   BMI 22.04 kg/m   General appearance: No distress Head: Normocephalic, without obvious abnormality, atraumatic Neck: no carotid bruit, no JVD and supple, symmetrical, trachea midline Resp: Clear to auscultation bilaterally Cardio: RRR, gallop Extremities: No LE edema. Has bandages for heel sores bilaterally. Right heel is in protective boot. Bilateral pressure decubitus of both feet related to bedridden status  Neurologic: Hard of hearing;Grossly intact without focal deficits  Diagnostic Studies & Laboratory data:    Right/Left Heart Cath and Coronary Angiography by Dr. Claiborne Billings on 07/19/2016:  Conclusion     Ost RCA lesion, 95 %stenosed.  Prox RCA lesion, 100 %stenosed.  Ost LM to LM lesion, 70 %stenosed.  Ost Cx to Prox Cx lesion, 75 %stenosed.  Ost LAD to Prox LAD lesion, 70 %stenosed.  Ost 1st Diag to 1st Diag lesion, 70 %stenosed.  Ost 2nd Diag to 2nd Diag lesion, 70 %stenosed.  Ost 1st Sept to 1st Sept lesion, 90 %stenosed.  Hemodynamic findings consistent with mild pulmonary hypertension.  Severe ischemic cardiomyopathy with diffuse hypocontractility and hypo-to akinesis in the mid- basal inferior wall with a global ejection  fraction of of 25-30%.  Severe multivessel CAD with 70% distal left main stenosis, 70% proximal LAD, diagonal 1 and diagonal 2 stenoses with 90% septal perforating artery stenoses; 75% proximal left circumflex stenoses, and total occlusion of the very proximal RCA with significant left to right collaterals.  RECOMMENDATION: Surgical consultation for CABG revascularization surgery.    I have independently reviewed the above  cath films and reviewed the findings with the  patient , however patient does not understand what he had done or the significance of the findings      Recent Radiology Findings:   CLINICAL DATA: Dyspnea  EXAM: PORTABLE CHEST 1 VIEW  COMPARISON: 07/17/2016  FINDINGS: 0523 hours. Cardiopericardial silhouette is at upper limits of normal for size. Pulmonary vascular congestion with bilateral interstitial and basilar airspace disease suggests edema, minimally improved in the interval. Bones are diffusely demineralized. Telemetry leads overlie the chest.  IMPRESSION: Interval improvement in lung aeration suggesting improving edema.   Electronically Signed By: Misty Stanley M.D. On: 07/18/2016 07:22   I have independently reviewed the above radiologic studies.  Recent Lab Findings: Recent Labs  Lab Results  Component Value Date   WBC 11.3 (H) 07/21/2016   HGB 10.0 (L) 07/21/2016   HCT 29.8 (L) 07/21/2016   PLT 116 (L) 07/21/2016   GLUCOSE 100 (H) 07/20/2016   CHOL 151 07/17/2016   TRIG 33 07/17/2016   HDL 67 07/17/2016   LDLCALC 77 07/17/2016   ALT 15 (L) 07/20/2016   AST 22 07/20/2016   NA 130 (L) 07/20/2016   K 4.1 07/20/2016   CL 98 (L) 07/20/2016   CREATININE 0.60 (L) 07/20/2016   BUN 9 07/20/2016   CO2 25 07/20/2016   TSH 0.618 07/17/2016   INR 1.10 07/19/2016     Assessment / Plan:   1. S/p NSTEMI, , ischemic cardiomyopathy, multivessel CAD- With the patients very poor Zubron functional status  he would NOT be a reasonable  candidate for coronary artery bypass grafting surgery,  2. Acute on chronic systolic congestive heart failure on admission-on Spironolactone 12.5 mg daily 3. Hypertension-on Coreg 6.25 mg bid and Lisinopril 2.5 mg daily 3. Thrombocytopenia-latest platelet count 116,000 4. Anemia-latest H and H 10 and 29.8 5. Hyperlipidemia-Atorvastatin 80 mg at hs 6. Tobacco and alcohol abuse-per medicine  I  spent 30  minutes counseling the patient face to face and 30% or more the  time was spent in counseling and coordination of care. The total time spent in the appointment was 50 minutes.   Grace Isaac MD      [address] [place] [address] Office [phone]   [place]

## 2016-07-21 NOTE — Progress Notes (Signed)
[facility] TEAM 1 - Stepdown/ICU TEAM  Vincent Dunlap [place]on  EPP:[id number] DOB: Sep 10, 1941 DOA: 07/17/2016 PCP: Thressa Sheller, MD    Brief Narrative:  75 year old male with history of HTN, CAD, and a pressure ulcer who was admitted on 07/17/2016 with a complaint of chest pain w/ progressive SOB and was found to have elevated troponin with suspected non-STEMI as well as acute on chronic CHF.  Subjective: The patient is alert but confused at the time of visit.  He cannot provide a reliable review of systems.  He does not appear to be in acute respiratory distress or suffering with uncontrolled pain.  He is pleasant but delirious.  Assessment & Plan:  Acute hypoxic respiratory failure due to acute exacerbation of systolic CHF EF 60-63% via TTE this admit - no ACEi/ARB due to hypotension - net negative approximately 5 L since admission with weight steadily trending downward  Filed Weights   07/19/16 0400 07/20/16 0406 07/21/16 0329  Weight: 71.5 kg (157 lb 9.6 oz) 74.1 kg (163 lb 6.4 oz) 71.7 kg (158 lb)    Multivessel CAD including distal left main Care per Cardiology - TCTS does not feel he is an appropriate candidate for CABG  Acute delirium Suspicious for alcohol withdrawal delirium - continue Ativan per protocol  Chronic pain syndrome on chronic oxycodone - no evidence of uncontrolled pain at my visit today  Hypotension Blood pressure stable/improved at this time  Hypomagnesemia Corrected  Chronic thrombocytopenia Baseline appears to be anywhere between 60 and 180 - stable at present - followed by Hematology as outpatient - felt to be due to splenomegaly as well as history of alcohol abuse  Gout / Osteoarthritis  DVT prophylaxis: IV heparin  Code Status: FULL CODE Family Communication: no family present at time of exam  Disposition Plan: SDU   Consultants:  [facility] Cards   Procedures: TTE 9/9  Antimicrobials:  Zosyn 9/9 Vancomycin 9/9  Objective: Blood pressure 105/69,  pulse 66, temperature 97.7 F (36.5 C), temperature source Oral, resp. rate 20, height 5' 11"  (1.803 m), weight 71.7 kg (158 lb), SpO2 100 %.  Intake/Output Summary (Last 24 hours) at 07/21/16 1557 Last data filed at 07/21/16 1513  Gross per 24 hour  Intake           811.06 ml  Output             1275 ml  Net          -463.94 ml   Filed Weights   07/19/16 0400 07/20/16 0406 07/21/16 0329  Weight: 71.5 kg (157 lb 9.6 oz) 74.1 kg (163 lb 6.4 oz) 71.7 kg (158 lb)    Examination: General: No acute respiratory distress - delirious Lungs: Clear to auscultation bilaterally - no wheezes or crackles Cardiovascular: Regular rate and rhythm without murmur Abdomen: Nontender, nondistended, soft, bowel sounds positive, no rebound, no ascites, no appreciable mass Extremities: No significant cyanosis, clubbing, edema bilateral lower extremities  CBC:  Recent Labs Lab 07/17/16 0705 07/18/16 0200 07/19/16 0302 07/20/16 0556 07/21/16 0222  WBC 14.8* 9.0 13.0* 10.5 11.3*  NEUTROABS 11.8*  --  7.4  --   --   HGB 12.1* 10.0* 10.9* 10.0* 10.0*  HCT 36.8* 30.7* 33.7* 30.7* 29.8*  MCV 90.0 89.0 90.6 90.3 89.2  PLT 180 161 148* 121* 016*   Basic Metabolic Panel:  Recent Labs Lab 07/17/16 0705 07/18/16 0200 07/19/16 0302 07/20/16 0556  NA 137 132* 130* 130*  K 3.5 3.4* 3.8 4.1  CL 98*  95* 94* 98*  CO2 24 28 28 25   GLUCOSE 156* 97 101* 100*  BUN 9 12 16 9   CREATININE 0.69 0.70 0.66 0.60*  CALCIUM 9.0 8.3* 8.5* 8.3*  MG  --   --  1.4* 1.8   GFR: Estimated Creatinine Clearance: 82.2 mL/min (by C-G formula based on SCr of 0.6 mg/dL (L)).  Liver Function Tests:  Recent Labs Lab 07/17/16 0705 07/19/16 0302 07/20/16 0556  AST 38 27 22  ALT 19 15* 15*  ALKPHOS 79 65 58  BILITOT 1.0 0.6 0.5  PROT 6.3* 5.5* 5.1*  ALBUMIN 3.4* 3.2* 2.8*    Coagulation Profile:  Recent Labs Lab 07/19/16 0301  INR 1.10    Cardiac Enzymes:  Recent Labs Lab 07/17/16 1025 07/17/16 1459  07/17/16 2132  TROPONINI 0.20* 0.35* 0.50*    Recent Results (from the past 240 hour(s))  Culture, blood (Routine X 2) w Reflex to ID Panel     Status: None (Preliminary result)   Collection Time: 07/17/16  8:30 AM  Result Value Ref Range Status   Specimen Description BLOOD RIGHT ANTECUBITAL  Final   Special Requests BOTTLES DRAWN AEROBIC AND ANAEROBIC 5CC  Final   Culture NO GROWTH 4 DAYS  Final   Report Status PENDING  Incomplete  Culture, blood (Routine X 2) w Reflex to ID Panel     Status: None (Preliminary result)   Collection Time: 07/17/16  8:40 AM  Result Value Ref Range Status   Specimen Description BLOOD LEFT HAND  Final   Special Requests BOTTLES DRAWN AEROBIC AND ANAEROBIC 5CC  Final   Culture NO GROWTH 4 DAYS  Final   Report Status PENDING  Incomplete  MRSA PCR Screening     Status: None   Collection Time: 07/17/16 12:34 PM  Result Value Ref Range Status   MRSA by PCR NEGATIVE NEGATIVE Final    Comment:        The GeneXpert MRSA Assay (FDA approved for NASAL specimens only), is one component of a comprehensive MRSA colonization surveillance program. It is not intended to diagnose MRSA infection nor to guide or monitor treatment for MRSA infections.      Scheduled Meds: . aspirin  81 mg Oral Daily  . atorvastatin  80 mg Oral q1800  . carvedilol  6.25 mg Oral BID WC  . folic acid  1 mg Oral Daily  . lisinopril  2.5 mg Oral Daily  . mouth rinse  15 mL Mouth Rinse BID  . multivitamin with minerals  1 tablet Oral Daily  . sodium chloride flush  3 mL Intravenous Q12H  . spironolactone  12.5 mg Oral Daily  . thiamine  100 mg Oral Daily   Continuous Infusions: . sodium chloride Stopped (07/19/16 1813)  . heparin 1,350 Units/hr (07/21/16 1513)  . nitroGLYCERIN Stopped (07/21/16 0845)     LOS: 4 days   Cherene Altes, MD [facility] Office  [phone] Pager - Text Page per Amion as per below:  On-Call/Text Page:      [email or web address]       password [email or web address]  If 7PM-7AM, please contact night-coverage [email or web address] Password [facility] 07/21/2016, 3:57 PM

## 2016-07-21 NOTE — Progress Notes (Signed)
Patient complains of 6/10 mid sternal chest pain. EKG done. Vitals stable. Patient appears comfortable. 2L [place] applied (for comfort) and Nitro drip restarted at 31mcg.   MD paged. Will continue to monitor.

## 2016-07-22 DIAGNOSIS — J449 Chronic obstructive pulmonary disease, unspecified: Secondary | ICD-10-CM

## 2016-07-22 DIAGNOSIS — I249 Acute ischemic heart disease, unspecified: Secondary | ICD-10-CM

## 2016-07-22 DIAGNOSIS — J441 Chronic obstructive pulmonary disease with (acute) exacerbation: Secondary | ICD-10-CM

## 2016-07-22 DIAGNOSIS — D693 Immune thrombocytopenic purpura: Secondary | ICD-10-CM

## 2016-07-22 DIAGNOSIS — L8990 Pressure ulcer of unspecified site, unspecified stage: Secondary | ICD-10-CM

## 2016-07-22 LAB — CBC
HCT: 31.2 % — ABNORMAL LOW (ref 39.0–52.0)
HEMOGLOBIN: 10 g/dL — AB (ref 13.0–17.0)
MCH: 29 pg (ref 26.0–34.0)
MCHC: 32.1 g/dL (ref 30.0–36.0)
MCV: 90.4 fL (ref 78.0–100.0)
Platelets: 115 10*3/uL — ABNORMAL LOW (ref 150–400)
RBC: 3.45 MIL/uL — AB (ref 4.22–5.81)
RDW: 16.9 % — ABNORMAL HIGH (ref 11.5–15.5)
WBC: 10.8 10*3/uL — AB (ref 4.0–10.5)

## 2016-07-22 LAB — CULTURE, BLOOD (ROUTINE X 2)
Culture: NO GROWTH
Culture: NO GROWTH

## 2016-07-22 LAB — BASIC METABOLIC PANEL
ANION GAP: 6 (ref 5–15)
BUN: 5 mg/dL — ABNORMAL LOW (ref 6–20)
CHLORIDE: 100 mmol/L — AB (ref 101–111)
CO2: 25 mmol/L (ref 22–32)
CREATININE: 0.62 mg/dL (ref 0.61–1.24)
Calcium: 8.7 mg/dL — ABNORMAL LOW (ref 8.9–10.3)
GFR calc non Af Amer: >60 mL/min (ref >60)
Glucose, Bld: 104 mg/dL — ABNORMAL HIGH (ref 65–99)
POTASSIUM: 3.9 mmol/L (ref 3.5–5.1)
SODIUM: 131 mmol/L — AB (ref 135–145)

## 2016-07-22 LAB — VITAMIN B12: VITAMIN B 12: 220 pg/mL (ref 180–914)

## 2016-07-22 LAB — FOLATE: FOLATE: 11.3 ng/mL (ref >5.9)

## 2016-07-22 LAB — HEPARIN LEVEL (UNFRACTIONATED): HEPARIN UNFRACTIONATED: 0.51 [IU]/mL (ref 0.30–0.70)

## 2016-07-22 NOTE — Progress Notes (Signed)
Patient tx to 2W33 from [place]. Patient alert and oriented to self and place. No pain. No SOB. Assessment done. Right groin level 0. VSS. Patient Oriented to room. Fall risk education done. Bed alarm placed. Call bell within reach. Will continue to monitor.   Domingo Dimes RN

## 2016-07-22 NOTE — NC FL2 (Signed)
[place] MEDICAID FL2 LEVEL OF CARE SCREENING TOOL     IDENTIFICATION  Patient Name: Vincent Dunlap Birthdate: 1941/03/29 Sex: male Admission Date (Current Location): 07/17/2016  [facility] and [place] Number:  Herbalist and Address:  [facility] [place]. [facility], [place] [address] [place], [place] [address]      Provider Number: [id number]  Attending Physician Name and Address:  Allie Bossier, MD  Relative Name and Phone Number:       Current Level of Care: Hospital Recommended Level of Care: [place] Prior Approval Number:    Date Approved/Denied:   PASRR Number: [id number]  Discharge Plan: SNF    Current Diagnoses: Patient Active Problem List   Diagnosis Date Noted  . Acute coronary syndrome ([place]) 07/22/2016  . COPD exacerbation ([place])   . 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 Acute systolic CHF (congestive heart failure) ([place])   . Chronic pain syndrome   . Exposure to potentially harmful entity   . Pressure ulcer 07/18/2016  . Acute respiratory failure with hypoxia ([place]) 07/17/2016  . Hypertension 07/17/2016  . Dizziness 07/17/2016  . Congestive heart failure ([place])   . CAD (coronary artery disease), native coronary artery   . NSTEMI (non-ST elevated myocardial infarction) ([place])   . At risk for falling 05/20/2014  . Thrombocytopenia ([place]) 08/30/2012    Orientation RESPIRATION BLADDER Height & Weight     Self  Normal External catheter Weight: 70.4 kg (155 lb 3.2 oz) Height:  5\' 11"  (180.3 cm)  BEHAVIORAL SYMPTOMS/MOOD NEUROLOGICAL BOWEL NUTRITION STATUS      Continent Diet (cardiac)  AMBULATORY STATUS COMMUNICATION OF NEEDS Skin   Extensive Assist Verbally PU Stage and Appropriate Care (unstageable PU dressing change PRN)                       Personal Care Assistance Level of Assistance  Bathing, Dressing Bathing Assistance: Maximum assistance   Dressing Assistance: Maximum assistance     Functional  Limitations Info             SPECIAL CARE FACTORS FREQUENCY  PT (By licensed PT), OT (By licensed OT)     PT Frequency: 5/wk OT Frequency: 5/wk            Contractures      Additional Factors Info  Code Status, Allergies, Isolation Precautions Code Status Info: FULL Allergies Info: Chantix Varenicline           Current Medications (07/22/2016):  This is the current hospital active medication list Current Facility-Administered Medications  Medication Dose Route Frequency Provider Last Rate Last Dose  . acetaminophen (TYLENOL) tablet 650 mg  650 mg Oral Q4H PRN Willia Craze, NP   650 mg at 07/17/16 1313  . aspirin chewable tablet 81 mg  81 mg Oral Daily Cherene Altes, MD   81 mg at 07/22/16 1030  . atorvastatin (LIPITOR) tablet 80 mg  80 mg Oral q1800 Troy Sine, MD   80 mg at 07/21/16 1728  . carvedilol (COREG) tablet 6.25 mg  6.25 mg Oral BID WC Belva Crome, MD   6.25 mg at 07/22/16 1030  . folic acid (FOLVITE) tablet 1 mg  1 mg Oral Daily Einar Grad, RPH   1 mg at 07/22/16 1031  . lisinopril (PRINIVIL,ZESTRIL) tablet 2.5 mg  2.5 mg Oral Daily Belva Crome, MD   2.5 mg at 07/22/16 1031  . LORazepam (ATIVAN) injection  2-3 mg  2-3 mg Intravenous Q1H PRN Jeryl Columbia, NP   2 mg at 07/22/16 1231  . MEDLINE mouth rinse  15 mL Mouth Rinse BID Kinnie Feil, MD   15 mL at 07/21/16 2105  . multivitamin with minerals tablet 1 tablet  1 tablet Oral Daily Einar Grad, RPH   1 tablet at 07/22/16 1030  . nitroGLYCERIN (NITROSTAT) SL tablet 0.4 mg  0.4 mg Sublingual Q5 min PRN Almyra Deforest, PA   0.4 mg at 07/19/16 1740  . ondansetron (ZOFRAN) injection 4 mg  4 mg Intravenous Q6H PRN Troy Sine, MD      . spironolactone (ALDACTONE) tablet 12.5 mg  12.5 mg Oral Daily Belva Crome, MD   12.5 mg at 07/22/16 1031  . thiamine (VITAMIN B-1) tablet 100 mg  100 mg Oral Daily Einar Grad, RPH   100 mg at 07/22/16 1031  . traMADol (ULTRAM) tablet 50 mg   50 mg Oral Q6H PRN Lavina Hamman, MD   50 mg at 07/22/16 1231     Discharge Medications: Please see discharge summary for a list of discharge medications.  Relevant Imaging Results:  Relevant Lab Results:   Additional Information SS#: [id number]  Jorge Ny, LCSW

## 2016-07-22 NOTE — Progress Notes (Signed)
ANTICOAGULATION CONSULT NOTE - Follow-Up Consult  Pharmacy Consult:  Heparin Indication: chest pain/ACS  Allergies  Allergen Reactions  . Chantix [Varenicline] Other (See Comments)    Pt states it made him crazy    Patient Measurements: Height: 5\' 11"  (180.3 cm) Weight: 155 lb 3.2 oz (70.4 kg) IBW/kg (Calculated) : 75.3 Heparin Dosing Weight: 82 kg  Vital Signs: Temp: 97.7 F (36.5 C) (09/14 0400) Temp Source: Oral (09/14 0400) BP: 91/67 (09/14 0618) Pulse Rate: 71 (09/14 0618)  Labs:  Recent Labs  07/20/16 0556 07/20/16 1621 07/21/16 0222 07/22/16 0318  HGB 10.0*  --  10.0* 10.0*  HCT 30.7*  --  29.8* 31.2*  PLT 121*  --  116* 115*  HEPARINUNFRC 0.29* 0.49 0.47 0.51  CREATININE 0.60*  --   --  0.62    Estimated Creatinine Clearance: 80.7 mL/min (by C-G formula based on SCr of 0.62 mg/dL).   Medical History: Past Medical History:  Diagnosis Date  . Hypertension   . Tobacco abuse       Assessment: 74 yoM started on heparin for ACS presented to the cath lab today 9/11. MD ordered heparin per pharmacy to begin 8-hours post-sheath removal with no bolus. Per cath note, pt with multivessel disease so CT surg consulted for CABG but pt was found to not be a good candidate. Heparin level therapeutic at 0.51 this morning. CBC stable, no S/Sx bleeding noted.  Goal of Therapy:  Heparin level 0.3-0.7 units/ml Monitor platelets by anticoagulation protocol: Yes    Plan:  -Continue heparin at 1350 units/hr -Daily HL, CBC, S/Sx bleeding -Follow-up further plans for surgical intervention/cath lab  Arrie Senate, PharmD PGY-1 Pharmacy Resident Pager: [phone] 07/22/2016

## 2016-07-22 NOTE — Progress Notes (Addendum)
PROGRESS NOTE    Vincent Dunlap  [id number] DOB: 1941/09/21 DOA: 07/17/2016 PCP: Thressa Sheller, MD   Brief Narrative:  75 y.o. male PMHx HTN and Tobacco Abuse,Chronic Pain (on oxycontine),  Gout   Patient presents to the emergency department with a two-week history of progressive dyspnea, worse with even minimal exertion. EMS found patient to be hypoxic, placed on CPAP.  Denies orthopnea.  No cough. No known fevers. No history of pneumonia nor heart failure. Patient is compliant with blood pressure medications at home. He doesn't add salt to food but does consume a lot of canned foods. He reports recent onset of lower extremity edema. He is dizzy with walking and standing.     Subjective: 9/14  A/O 4 (with some hinting), states having waxing and waning CP. Pain located under left breast. Somewhat sleepy but just received Ativan   Assessment & Plan:   Principal Problem:   Acute respiratory failure with hypoxia (HCC) Active Problems:   Dyspnea   Hypertension   Elevated lactic acid level   Sinus tachycardia (HCC)   Dizziness   Coronary artery disease due to lipid rich plaque   NSTEMI (non-ST elevated myocardial infarction) (HCC)   Pressure ulcer   Acute systolic CHF (congestive heart failure) (HCC)   Chronic pain syndrome   Exposure to potentially harmful entity   Acute hypoxic respiratory failure due to Acute exacerbation of Systolic CHF -EF 93-26% via TTE this admit  - no ACEi/ARB due to hypotension -Strict in and out since admission -5.6 L -Daily weigh Filed Weights   07/20/16 0406 07/21/16 0329 07/22/16 0500  Weight: 74.1 kg (163 lb 6.4 oz) 71.7 kg (158 lb) 70.4 kg (155 lb 3.2 oz)  -9/11 right/left heart cath: Multivessel disease requires CABG see results below -Per cardiothoracic surgery note patient is not a good candidate for CABG. Medical management only  Non-STEMI -Unable to tolerate IV nitro due to hypotension (remains relatively hypotensive)  -Coreg 6.25  mg BID -Lisinopril 2.5 mg daily -Might benefit from nitrate (Imdur) but currently remains borderline hypotensive. Defer to cardiology  Chronic pain syndrome -on chronic oxycodone - holding at present due to hypotension   - no evidence of uncontrolled pain. 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  Severe multivessel CAD with 70% distal left main stenosis, 70% proximal LAD, diagonal 1 and diagonal 2 stenoses with 90% septal perforating artery stenoses; 75% proximal left circumflex stenoses, and total occlusion of the very proximal RCA with significant left to right collaterals.  Cultures   Antimicrobials: Zosyn 9/9>> 1 dose Vancomycin 9/9>> 1 dose   Devices    LINES / TUBES:      Continuous Infusions: . heparin 1,350 Units/hr (07/22/16 0517)  . nitroGLYCERIN Stopped (07/21/16 0845)     Objective: Vitals:   07/22/16 0826 07/22/16 0900 07/22/16 1000 07/22/16 1030  BP: (!) 99/52  (!) 101/59 (!) 101/59  Pulse: 91 88 86 95  Resp: 20 20 18    Temp: 98.2 F (36.8 C)     TempSrc: Oral     SpO2: 97% 98% 93%   Weight:      Height:        Intake/Output Summary (Last 24 hours) at 07/22/16 1103 Last data filed at 07/22/16 1100  Gross per 24 hour  Intake               324 ml  Output             1675 ml  Net            -1351 ml   Filed Weights   07/20/16 0406 07/21/16 0329 07/22/16 0500  Weight: 74.1 kg (163 lb 6.4 oz) 71.7 kg (158 lb) 70.4 kg (155 lb 3.2 oz)    Examination:  General: A/O 4 (with some hinting), continued waxing and waning CP, No acute respiratory distress Eyes: negative scleral hemorrhage, negative anisocoria, negative icterus ENT: Negative Runny nose, negative gingival bleeding, Neck:  Negative scars, masses, torticollis, lymphadenopathy, JVD Lungs: Clear to auscultation bilaterally without wheezes or crackles Cardiovascular: Regular rate and rhythm without murmur gallop or rub normal S1 and S2 Abdomen: negative abdominal pain, nondistended, positive soft, bowel sounds, no rebound, no ascites, no appreciable mass Extremities: No significant cyanosis, clubbing, or edema bilateral lower extremities Skin: Bilateral heel pressure ulcers (negative sign of infection) Psychiatric:  Negative depression, negative anxiety, negative fatigue, negative mania  Central nervous system:  Cranial nerves II through XII intact, tongue/uvula midline, all extremities muscle strength 5/5, sensation intact throughout, negative dysarthria, negative expressive aphasia, negative receptive aphasia.  .     Data Reviewed: Care during the described time interval was provided by me .  I have reviewed this patient's available data, including medical history, events of note, physical examination, and all test results as part of my evaluation. I have personally reviewed and interpreted all radiology studies.  CBC:  Recent Labs Lab 07/17/16 0705 07/18/16 0200 07/19/16 0302 07/20/16 0556 07/21/16 0222 07/22/16 0318  WBC 14.8* 9.0 13.0* 10.5 11.3* 10.8*  NEUTROABS 11.8*  --  7.4  --   --   --   HGB 12.1* 10.0* 10.9* 10.0* 10.0* 10.0*  HCT 36.8* 30.7* 33.7* 30.7* 29.8* 31.2*  MCV 90.0 89.0 90.6 90.3 89.2 90.4  PLT 180 161 148* 121* 116* 115*   Basic  Metabolic Panel:  Recent Labs Lab 07/17/16 0705 07/18/16 0200 07/19/16 0302 07/20/16 0556 07/22/16 0318  NA 137 132* 130* 130* 131*  K 3.5 3.4* 3.8 4.1 3.9  CL 98* 95* 94* 98* 100*  CO2 24 28 28 25 25   GLUCOSE 156* 97 101* 100* 104*  BUN 9 12 16 9  5*  CREATININE 0.69 0.70 0.66 0.60* 0.62  CALCIUM 9.0 8.3* 8.5* 8.3* 8.7*  MG  --   --  1.4* 1.8  --    GFR: Estimated Creatinine Clearance: 80.7 mL/min (by C-G formula based on SCr of 0.62 mg/dL). Liver Function Tests:  Recent Labs Lab 07/17/16 0705 07/19/16 0302 07/20/16 0556  AST 38 27 22  ALT 19 15* 15*  ALKPHOS 79 65 58  BILITOT 1.0 0.6 0.5  PROT 6.3* 5.5* 5.1*  ALBUMIN 3.4* 3.2* 2.8*   No results for input(s): LIPASE, AMYLASE in the last 168 hours. No results for input(s): AMMONIA in the last 168 hours. Coagulation Profile:  Recent Labs Lab 07/19/16  0301  INR 1.10   Cardiac Enzymes:  Recent Labs Lab 07/17/16 1025 07/17/16 1459 07/17/16 2132  TROPONINI 0.20* 0.35* 0.50*   BNP (last 3 results) No results for input(s): PROBNP in the last 8760 hours. HbA1C: No results for input(s): HGBA1C in the last 72 hours. CBG:  Recent Labs Lab 07/19/16 2028  GLUCAP 143*   Lipid Profile: No results for input(s): CHOL, HDL, LDLCALC, TRIG, CHOLHDL, LDLDIRECT in the last 72 hours. Thyroid Function Tests: No results for input(s): TSH, T4TOTAL, FREET4, T3FREE, THYROIDAB in the last 72 hours. Anemia Panel:  Recent Labs  07/22/16 0318  VITAMINB12 220  FOLATE 11.3   Urine analysis:    Component Value Date/Time   COLORURINE YELLOW 07/17/2016 1107   APPEARANCEUR CLEAR 07/17/2016 1107   LABSPEC 1.016 07/17/2016 1107   PHURINE 5.5 07/17/2016 1107   GLUCOSEU NEGATIVE 07/17/2016 1107   HGBUR NEGATIVE 07/17/2016 1107   BILIRUBINUR NEGATIVE 07/17/2016 1107   KETONESUR 15 (A) 07/17/2016 1107   PROTEINUR NEGATIVE 07/17/2016 1107   UROBILINOGEN 1.0 07/29/2009 0002   NITRITE NEGATIVE 07/17/2016 1107   LEUKOCYTESUR  NEGATIVE 07/17/2016 1107   Sepsis Labs: @LABRCNTIP (procalcitonin:4,lacticidven:4)  ) Recent Results (from the past 240 hour(s))  Culture, blood (Routine X 2) w Reflex to ID Panel     Status: None (Preliminary result)   Collection Time: 07/17/16  8:30 AM  Result Value Ref Range Status   Specimen Description BLOOD RIGHT ANTECUBITAL  Final   Special Requests BOTTLES DRAWN AEROBIC AND ANAEROBIC 5CC  Final   Culture NO GROWTH 4 DAYS  Final   Report Status PENDING  Incomplete  Culture, blood (Routine X 2) w Reflex to ID Panel     Status: None (Preliminary result)   Collection Time: 07/17/16  8:40 AM  Result Value Ref Range Status   Specimen Description BLOOD LEFT HAND  Final   Special Requests BOTTLES DRAWN AEROBIC AND ANAEROBIC 5CC  Final   Culture NO GROWTH 4 DAYS  Final   Report Status PENDING  Incomplete  MRSA PCR Screening     Status: None   Collection Time: 07/17/16 12:34 PM  Result Value Ref Range Status   MRSA by PCR NEGATIVE NEGATIVE Final    Comment:        The GeneXpert MRSA Assay (FDA approved for NASAL specimens only), is one component of a comprehensive MRSA colonization surveillance program. It is not intended to diagnose MRSA infection nor to guide or monitor treatment for MRSA infections.          Radiology Studies: No results found.      Scheduled Meds: . aspirin  81 mg Oral Daily  . atorvastatin  80 mg Oral q1800  . carvedilol  6.25 mg Oral BID WC  . folic acid  1 mg Oral Daily  . lisinopril  2.5 mg Oral Daily  . mouth rinse  15 mL Mouth Rinse BID  . multivitamin with minerals  1 tablet Oral Daily  . spironolactone  12.5 mg Oral Daily  . thiamine  100 mg Oral Daily   Continuous Infusions: . heparin 1,350 Units/hr (07/22/16 0517)  . nitroGLYCERIN Stopped (07/21/16 0845)     LOS: 5 days    Time spent: 40 minutes    Vincent Dunlap, Geraldo Docker, MD [facility] Pager [phone]   If 7PM-7AM, please contact  night-coverage [email or web address] Password [email or web address] 07/22/2016, 11:03 AM
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
## 2016-07-22 NOTE — Evaluation (Signed)
Physical Therapy Evaluation Patient Details Name: Vincent Dunlap MRN: [id number] DOB: 1941-10-02 Today's Date: 07/22/2016   History of Present Illness  75 y/o male with PMH of HTN, Gout, Chronic Pain (on oxycontine), Alcohol, Tobacco use presented with DOE and dizziness.  Admitted with acute respiratory failure, hypoxia , acute heart failure (unknow type) with pulmonary edema.  Clinical Impression  Patient presents with decreased independence with mobility due to deficits listed in PT problem list.  He will benefit from skilled PT in the acute setting to allow return home following SNF level rehab stay.    Follow Up Recommendations SNF;Supervision/Assistance - 24 hour    Equipment Recommendations  None recommended by PT    Recommendations for Other Services       Precautions / Restrictions Precautions Precautions: Fall      Mobility  Bed Mobility Overal bed mobility: Needs Assistance Bed Mobility: Supine to Sit     Supine to sit: Min assist;HOB elevated     General bed mobility comments: legs already off the bed, assist to lift trunk  Transfers Overall transfer level: Needs assistance Equipment used: None Transfers: Sit to/from [facility] Sit to Stand: Mod assist Stand pivot transfers: Min assist       General transfer comment: lifting assist to stand and uncontrolled descent into chair, min A for balance to pivot to chair in flexed position holding armrest of chair  Ambulation/Gait                Stairs            Wheelchair Mobility    Modified Rankin (Stroke Patients Only)       Balance Overall balance assessment: Needs assistance   Sitting balance-Leahy Scale: Fair     Standing balance support: Single extremity supported Standing balance-Leahy Scale: Poor Standing balance comment: needs UE support standing during transition to chair                             Pertinent Vitals/Pain Pain Assessment:  Faces Faces Pain Scale: Hurts little more Pain Location: Generalized Pain Descriptors / Indicators: Aching Pain Intervention(s): Monitored during session;Repositioned    Home Living Family/patient expects to be discharged to:: Private residence Living Arrangements: Alone Available Help at Discharge: Personal care attendant Type of Home: House Home Access: Stairs to enter   [employer] of Steps: 3 Home Layout: One level Home Equipment: Cane - single point;Wheelchair - power;Hand held shower head;Shower seat Additional Comments: fell as recent as month and a half ago walking down hallway, drags himself to something where he can pull up    Prior Function Level of Independence: Needs assistance      ADL's / Homemaking Assistance Needed: aide assists with bathing  Comments: Difficult to get information at times due to patient's level of understanding and my level of understanding him     Hand Dominance        Extremity/Trunk Assessment   Upper Extremity Assessment: RUE deficits/detail;LUE deficits/detail RUE Deficits / Details: Shoulder flexion AAROM limited to about 85 with pain (reports history of shoulder surgery for rotator cuff bilaterally); elbow flexion WFL, extension limited by at least 10 degrees strength shoulder flexion 1+/5; elbow flexion 4-/5, extension 4/5     LUE Deficits / Details: Shoulder flexion AAROM WFL, elbow AROM WFL, strength shoulder flexion 2-/5, elbow flexion 3+/5, extension 3+/5   Lower Extremity Assessment: RLE deficits/detail;LLE deficits/detail RLE Deficits / Details: AROM  WFL, strength grossly 4-/5 LLE Deficits / Details: AROM WFL, strength grossly 4-/5  Cervical / Trunk Assessment: Kyphotic  Communication   Communication: Other (comment) (slurred speech)  Cognition Arousal/Alertness: Awake/alert Behavior During Therapy: WFL for tasks assessed/performed Overall Cognitive Status: No family/caregiver present to determine baseline  cognitive functioning                      General Comments General comments (skin integrity, edema, etc.): R elbow with bony overgrowth at olecranon and another growth along lateral aspect of forearm; has mepelix dressings on bilateral heels    Exercises        Assessment/Plan    PT Assessment Patient needs continued PT services  PT Diagnosis Generalized weakness;Difficulty walking   PT Problem List Decreased strength;Decreased activity tolerance;Decreased balance;Decreased knowledge of use of DME;Decreased mobility;Decreased safety awareness;Decreased knowledge of precautions;Cardiopulmonary status limiting activity  PT Treatment Interventions DME instruction;Therapeutic activities;Gait training;Therapeutic exercise;Patient/family education;Functional mobility training;Balance training;Stair training   PT Goals (Current goals can be found in the Care Plan section) Acute Rehab PT Goals Patient Stated Goal: To go home (cause people will take my stuff) PT Goal Formulation: With patient Time For Goal Achievement: 07/29/16 Potential to Achieve Goals: Fair    Frequency Min 3X/week   Barriers to discharge Decreased caregiver support      Co-evaluation               End of Session Equipment Utilized During Treatment: Gait belt Activity Tolerance: Patient tolerated treatment well Patient left: in chair;with chair alarm set           Time: [id number] PT Time Calculation (min) (ACUTE ONLY): 36 min   Charges:   PT Evaluation $PT Eval Moderate Complexity: 1 Procedure PT Treatments $Therapeutic Activity: 8-22 mins   PT G CodesReginia Naas 08-02-16, 12:40 PM  Magda Kiel, [place] Aug 02, 2016

## 2016-07-22 NOTE — Progress Notes (Signed)
       Patient Name: Vincent Dunlap Date of Encounter: 07/22/2016    SUBJECTIVE: Mr. Umanzor is not complaining of any dyspnea. He complains of chest discomfort in the right lower costal region. He has had various other chest pain complaints since admission. He was seen by Dr. Servando Snare yesterday and is not felt to be a reasonable bypass surgical candidate.  Reconfirm that the patient is essentially bedridden. He does not ambulate freely. He drinks alcohol heavily.  TELEMETRY:  Normal sinus rhythm is noted. Vitals:   07/22/16 0826 07/22/16 0900 07/22/16 1000 07/22/16 1030  BP: (!) 99/52  (!) 101/59 (!) 101/59  Pulse: 91 88 86 95  Resp: 20 20 18    Temp: 98.2 F (36.8 C)     TempSrc: Oral     SpO2: 97% 98% 93%   Weight:      Height:        Intake/Output Summary (Last 24 hours) at 07/22/16 1100 Last data filed at 07/22/16 0600  Gross per 24 hour  Intake            256.5 ml  Output             1675 ml  Net          -1418.5 ml   LABS: Basic Metabolic Panel:  Recent Labs  07/20/16 0556 07/22/16 0318  NA 130* 131*  K 4.1 3.9  CL 98* 100*  CO2 25 25  GLUCOSE 100* 104*  BUN 9 5*  CREATININE 0.60* 0.62  CALCIUM 8.3* 8.7*  MG 1.8  --    CBC:  Recent Labs  07/21/16 0222 07/22/16 0318  WBC 11.3* 10.8*  HGB 10.0* 10.0*  HCT 29.8* 31.2*  MCV 89.2 90.4  PLT 116* 115*    Radiology/Studies:  No new data  Physical Exam: Blood pressure (!) 101/59, pulse 95, temperature 98.2 F (36.8 C), temperature source Oral, resp. rate 18, height 5' 11"  (1.803 m), weight 155 lb 3.2 oz (70.4 kg), SpO2 93 %. Weight change: -2 lb 12.8 oz (-1.27 kg)  Wt Readings from Last 3 Encounters:  07/22/16 155 lb 3.2 oz (70.4 kg)  08/30/12 198 lb 1.6 oz (89.9 kg)   An S4 gallop is audible with a 3/1-5/1 apical systolic murmur compatible with mitral regurgitation. Basal rales are heard on auscultation bilaterally. Neck veins are flat. There is no peripheral edema. Various skin lesions/ulcers are  noted on lower extremities.  ASSESSMENT:  1. Multivessel coronary artery disease including left main. Because of deconditioning and other comorbidities, the patient is not a candidate for surgical revascularization. Plan is to advance medical therapy regimen. Should he develop unstable angina/uncontrollable symptomatic angina, we will need to consider a high risk PCI approach(CHIP). This is not prudent at this time given no symptoms on medical therapy. 2. Ischemic cardiomyopathy with chronic systolic heart failure, EF 35%. No current evidence of volume overload or symptoms of heart failure. 3. Long-standing history of essential hypertension 4. Hyperlipidemia 5. Debility with bedridden state and decubitus/lower extremity ulcers on heels.  Plan:  1. Optimize heart feed therapy based on guidelines. 2. Optimize antianginal therapy. 3. Wound care team to help with lower extremity decubiti 4. Transfer to telemetry 5. PT consult to consider recommendations safety at discharge. 6. Care Management to begin process required for discharge.  Signed, Belva Crome III 07/22/2016, 11:00 AM

## 2016-07-23 LAB — BASIC METABOLIC PANEL
Anion gap: 8 (ref 5–15)
BUN: 9 mg/dL (ref 6–20)
CHLORIDE: 99 mmol/L — AB (ref 101–111)
CO2: 24 mmol/L (ref 22–32)
CREATININE: 0.67 mg/dL (ref 0.61–1.24)
Calcium: 8.8 mg/dL — ABNORMAL LOW (ref 8.9–10.3)
GFR calc Af Amer: >60 mL/min (ref >60)
GFR calc non Af Amer: >60 mL/min (ref >60)
Glucose, Bld: 102 mg/dL — ABNORMAL HIGH (ref 65–99)
POTASSIUM: 4 mmol/L (ref 3.5–5.1)
Sodium: 131 mmol/L — ABNORMAL LOW (ref 135–145)

## 2016-07-23 MED ORDER — INFLUENZA VAC SPLIT QUAD 0.5 ML IM SUSY
0.5000 mL | PREFILLED_SYRINGE | INTRAMUSCULAR | Status: AC
  Administered 2016-07-25: 0.5 mL via INTRAMUSCULAR
  Filled 2016-07-23: qty 0.5

## 2016-07-23 NOTE — Evaluation (Signed)
Occupational Therapy Evaluation Patient Details Name: Vincent Dunlap MRN: [id number] DOB: [birth year] Today's Date: 07/23/2016    History of Present Illness 75 y/o male with PMH of HTN, Gout, Chronic Pain (on oxycontine), Alcohol, Tobacco use presented with DOE and dizziness.  Admitted with acute respiratory failure, hypoxia , acute heart failure (unknow type) with pulmonary edema.   Clinical Impression   PTA, pt reports he required assistance for bathing and dressing from his PCA and primarily used [facility] for mobility. Pt currently requires min guard assist for basic transfers and mod assist for ADLs due to limited bilateral shoulder ROM and strength. Pt plans to d/c to SNF for post-acute rehab. Pt will benefit from continued acute OT to increase independence and safety with ADLs and mobility. Will continue to follow acutely.    Follow Up Recommendations  SNF;Supervision/Assistance - 24 hour    Equipment Recommendations  None recommended by OT    Recommendations for Other Services       Precautions / Restrictions Precautions Precautions: Fall Restrictions Weight Bearing Restrictions: No      Mobility Bed Mobility Overal bed mobility: Needs Assistance Bed Mobility: Supine to Sit     Supine to sit: Min guard;HOB elevated     General bed mobility comments: Min guard assist for assistance as pt rocking bath and forth for momentum to sit EOB.   Transfers Overall transfer level: Needs assistance Equipment used: Rolling walker (2 wheeled) Transfers: Sit to/from [facility] Sit to Stand: Min guard Stand pivot transfers: Min guard       General transfer comment: Min guard assist for safety. VCs for safe hand placement. Pt demonstrated more controlled descent onto chair this session.    Balance Overall balance assessment: Needs assistance Sitting-balance support: No upper extremity supported;Feet supported Sitting balance-Leahy Scale: Good     Standing balance  support: Bilateral upper extremity supported;During functional activity Standing balance-Leahy Scale: Poor Standing balance comment: Reliant on BUE support to maintain balance with all tasks                            ADL Overall ADL's : Needs assistance/impaired Eating/Feeding: Set up;Sitting   Grooming: Wash/dry hands;Wash/dry face;Set up;Sitting   Upper Body Bathing: Minimal assitance;Sitting   Lower Body Bathing: Moderate assistance;Sit to/from stand   Upper Body Dressing : Minimal assistance;Sitting   Lower Body Dressing: Moderate assistance;Sit to/from stand   Toilet Transfer: Min guard;Cueing for safety;Ambulation;BSC;RW   Toileting- Clothing Manipulation and Hygiene: Moderate assistance;Sit to/from stand       Functional mobility during ADLs: Min guard;Rolling walker General ADL Comments: Pt requires increased assistance for ADLs d/t limited shoulder ROM and strength     Vision Vision Assessment?: No apparent visual deficits   Perception     Praxis      Pertinent Vitals/Pain Pain Assessment: Faces Faces Pain Scale: Hurts little more Pain Location: generalized Pain Descriptors / Indicators: Aching Pain Intervention(s): Monitored during session;Repositioned     Hand Dominance Right   Extremity/Trunk Assessment Upper Extremity Assessment Upper Extremity Assessment: RUE deficits/detail;LUE deficits/detail RUE Deficits / Details: Shoulder flexion AAROM limited to about 85 with pain (reports history of shoulder surgery for rotator cuff bilaterally); elbow flexion WFL, extension limited by at least 10 degrees strength shoulder flexion 1+/5; elbow flexion 4-/5, extension 4/5 RUE Sensation: history of peripheral neuropathy RUE Coordination: decreased fine motor;decreased gross motor LUE Deficits / Details: Shoulder flexion AAROM WFL, elbow AROM WFL, strength shoulder  flexion 2-/5, elbow flexion 3+/5, extension 3+/5 LUE Sensation: history of peripheral  neuropathy LUE Coordination: decreased fine motor;decreased gross motor   Lower Extremity Assessment Lower Extremity Assessment: Defer to PT evaluation   Cervical / Trunk Assessment Cervical / Trunk Assessment: Kyphotic   Communication Communication Communication: Other (comment) (slurred speech)   Cognition Arousal/Alertness: Awake/alert Behavior During Therapy: WFL for tasks assessed/performed Overall Cognitive Status: No family/caregiver present to determine baseline cognitive functioning                     General Comments       Exercises       Shoulder Instructions      Home Living Family/patient expects to be discharged to:: Private residence Living Arrangements: Alone Available Help at Discharge: Personal care attendant Type of Home: House Home Access: Stairs to enter [employer] of Steps: 3   Home Layout: One level     Bathroom Shower/Tub: Tub/shower unit Shower/tub characteristics: Curtain Biochemist, clinical: Standard     Home Equipment: Environmental consultant - 2 wheels;Cane - single point;Wheelchair - power;Shower seat;Bedside commode;Grab bars - toilet;Grab bars - tub/shower;Hand held shower head   Additional Comments: fell as recent as month and a half ago walking down hallway, drags himself to something where he can pull up      Prior Functioning/Environment Level of Independence: Needs assistance  Gait / Transfers Assistance Needed: Reports using SPC mostly and sometimes RW ADL's / Homemaking Assistance Needed: aide assists with bathing, dressing, meal prep, housekeeping, grocery shopping and provides transportation            OT Problem List: Decreased strength;Decreased activity tolerance;Decreased range of motion;Impaired balance (sitting and/or standing);Decreased coordination;Decreased safety awareness;Decreased knowledge of use of DME or AE;Decreased cognition;Pain;Impaired UE functional use   OT Treatment/Interventions: Self-care/ADL  training;Therapeutic exercise;DME and/or AE instruction;Therapeutic activities;Patient/family education;Balance training;Energy conservation    OT Goals(Current goals can be found in the care plan section) Acute Rehab OT Goals Patient Stated Goal: To go home (cause people will take my stuff) OT Goal Formulation: With patient Time For Goal Achievement: 08/06/16 Potential to Achieve Goals: Good ADL Goals Pt Will Perform Grooming: with min guard assist;standing Pt Will Perform Upper Body Bathing: sitting;with min guard assist Pt Will Perform Lower Body Bathing: with min guard assist;sit to/from stand Pt Will Transfer to Toilet: with min guard assist;ambulating;bedside commode (over toilet) Pt Will Perform Toileting - Clothing Manipulation and hygiene: with min guard assist;sit to/from stand Pt/caregiver will Perform Home Exercise Program: Increased ROM;Both right and left upper extremity;With Supervision;With written HEP provided  OT Frequency: Min 2X/week   Barriers to D/C: Decreased caregiver support  lives alone       Co-evaluation              End of Session Equipment Utilized During Treatment: Gait belt;Rolling walker Nurse Communication: Mobility status;Other (comment) (condom catheter off in the bed)  Activity Tolerance: Patient tolerated treatment well Patient left: in chair;with call bell/phone within reach   Time: 1510-1535 OT Time Calculation (min): 25 min Charges:  OT General Charges $OT Visit: 1 Procedure OT Evaluation $OT Eval Moderate Complexity: 1 Procedure OT Treatments $Self Care/Home Management : 8-22 mins G-Codes:    Redmond Baseman, OTR/L Pager: [phone] 07/23/2016, 3:57 PM

## 2016-07-23 NOTE — Clinical Social Work Note (Signed)
CSW met with patient and presented bed offers. He reported his friend [name] would help him to make a decision on SNF placement. CSW called [name] and she reported preference for [facility]. CSW will continue to follow for discharge needs.   [employer], LCSW [phone]

## 2016-07-23 NOTE — Progress Notes (Addendum)
Patient Profile: 75 year old Caucasian male with h/o HTN and HLD who presented to the hospital with complaints of chest pain, dizziness, and shortness of breath. An echo Doppler study revealed an ejection fraction of 30 - 35%. He was referred for right and left heart cardiac catheterization. Found to have sever multivessel CAD. Seen by CV surgery and deemed not a surgical candidate for CABG due to deconditioning and other comorbidities . Plan is for medical management.   Subjective: Currently asymptomatic, however he had an episode of mild left sternal chest pain earlier this am.   Objective: Vital signs in last 24 hours: Temp:  [97.6 F (36.4 C)-98.1 F (36.7 C)] 97.6 F (36.4 C) (09/15 0612) Pulse Rate:  [79-95] 91 (09/15 0612) Resp:  [14-18] 18 (09/15 0612) BP: (101-130)/(59-76) 130/72 (09/15 0612) SpO2:  [96 %-100 %] 100 % (09/15 0612) Weight:  [156 lb 9.6 oz (71 kg)] 156 lb 9.6 oz (71 kg) (09/15 0612) Last BM Date: 07/22/16  Intake/Output from previous day: 09/14 0701 - 09/15 0700 In: 708 [P.O.:600; I.V.:108] Out: 300 [Urine:300] Intake/Output this shift: Total I/O In: -  Out: 400 [Urine:400]  Medications Current Facility-Administered Medications  Medication Dose Route Frequency Provider Last Rate Last Dose  . acetaminophen (TYLENOL) tablet 650 mg  650 mg Oral Q4H PRN Willia Craze, NP   650 mg at 07/17/16 1313  . aspirin chewable tablet 81 mg  81 mg Oral Daily Cherene Altes, MD   81 mg at 07/22/16 1030  . atorvastatin (LIPITOR) tablet 80 mg  80 mg Oral q1800 Troy Sine, MD   80 mg at 07/22/16 1743  . carvedilol (COREG) tablet 6.25 mg  6.25 mg Oral BID WC Belva Crome, MD   6.25 mg at 07/22/16 1743  . folic acid (FOLVITE) tablet 1 mg  1 mg Oral Daily Einar Grad, RPH   1 mg at 07/22/16 1031  . lisinopril (PRINIVIL,ZESTRIL) tablet 2.5 mg  2.5 mg Oral Daily Belva Crome, MD   2.5 mg at 07/22/16 1031  . LORazepam (ATIVAN) injection 2-3 mg  2-3 mg  Intravenous Q1H PRN Jeryl Columbia, NP   2 mg at 07/22/16 1231  . MEDLINE mouth rinse  15 mL Mouth Rinse BID Kinnie Feil, MD   15 mL at 07/22/16 2107  . multivitamin with minerals tablet 1 tablet  1 tablet Oral Daily Einar Grad, RPH   1 tablet at 07/22/16 1030  . nitroGLYCERIN (NITROSTAT) SL tablet 0.4 mg  0.4 mg Sublingual Q5 min PRN Almyra Deforest, PA   0.4 mg at 07/19/16 1740  . ondansetron (ZOFRAN) injection 4 mg  4 mg Intravenous Q6H PRN Troy Sine, MD      . spironolactone (ALDACTONE) tablet 12.5 mg  12.5 mg Oral Daily Belva Crome, MD   12.5 mg at 07/22/16 1031  . thiamine (VITAMIN B-1) tablet 100 mg  100 mg Oral Daily Einar Grad, RPH   100 mg at 07/22/16 1031  . traMADol (ULTRAM) tablet 50 mg  50 mg Oral Q6H PRN Lavina Hamman, MD   50 mg at 07/22/16 6644    PE: General appearance: alert, cooperative and no distress Neck: no carotid bruit and no JVD Lungs: clear to auscultation bilaterally Heart: regular rate and rhythm, S1, S2 normal, no murmur, click, rub or gallop Extremities: no LEe Pulses: 2+ and symmetric Skin: warm and dry Neurologic: Grossly normal  Lab Results:   Recent Labs  07/21/16 0222 07/22/16 0318  WBC 11.3* 10.8*  HGB 10.0* 10.0*  HCT 29.8* 31.2*  PLT 116* 115*   BMET  Recent Labs  07/22/16 0318 07/23/16 0225  NA 131* 131*  K 3.9 4.0  CL 100* 99*  CO2 25 24  GLUCOSE 104* 102*  BUN 5* 9  CREATININE 0.62 0.67  CALCIUM 8.7* 8.8*   Cardiac Panel (last 3 results) No results for input(s): CKTOTAL, CKMB, TROPONINI, RELINDX in the last 72 hours.  Studies/Results: Procedures   Right/Left Heart Cath and Coronary Angiography  Conclusion     Ost RCA lesion, 95 %stenosed.  Prox RCA lesion, 100 %stenosed.  Ost LM to LM lesion, 70 %stenosed.  Ost Cx to Prox Cx lesion, 75 %stenosed.  Ost LAD to Prox LAD lesion, 70 %stenosed.  Ost 1st Diag to 1st Diag lesion, 70 %stenosed.  Ost 2nd Diag to 2nd Diag lesion, 70  %stenosed.  Ost 1st Sept to 1st Sept lesion, 90 %stenosed.  Hemodynamic findings consistent with mild pulmonary hypertension.   Severe ischemic cardiomyopathy with diffuse hypocontractility and hypo-to akinesis in the mid- basal inferior wall with a global ejection fraction of of 25-30%.  Severe multivessel CAD with 70% distal left main stenosis, 70% proximal LAD, diagonal 1 and diagonal 2 stenoses with 90% septal perforating artery stenoses; 75% proximal left circumflex stenoses, and total occlusion of the very proximal RCA with significant left to right collaterals.    2D Echo  Study Conclusions  - Left ventricle: The cavity size was normal. Wall thickness was   increased in a pattern of mild LVH. Systolic function was   moderately to severely reduced. The estimated ejection fraction   was in the range of 30% to 35%. Akinesis of the   mid-apicalanteroseptal myocardium. There was fusion of early and   atrial contributions to ventricular filling. - Right atrium: The atrium was mildly dilated. - Pulmonary arteries: Systolic pressure was mildly increased. PA   peak pressure: 37 mm Hg (S).  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 Multivessel CAD: this includes LM involvement. Seen by CV surgery and deemed not a surgical candidate for CABG due to deconditioning and other comorbidities. Plan is for medical management. He remains CP free on current regimen which includes ASA, high intensity statin, BB, ACE-I and Aldactone. Since he is not going  for surgery, consider addition of Plavix. We will continue this plan of care as long a he remains CP free on medical therapy. If recurrent CP, despite maximization of his antianginals, can consider high risk PCI.   2. Ischemic Cardiomyopathy/ Systolic CHF: EF 93%. Volume appears stable. Continue optimization of HF therapy meds based on guidelines. Continue BB therapy with Coreg, ACE-I therapy with Lisinopril + Aldactone. Low sodium diet. Compliance might be an issue in this patient.     LOS: 6 days    Brittainy M. Ladoris Gene 07/23/2016 10:02 AM The patient has been seen in conjunction with Lyda Jester, PA-C. All aspects of care have been considered and discussed. The patient has been personally interviewed, examined,  and all clinical data has been reviewed.   Patient is on relatively low dose heart failure and anti-ischemic therapy. Blood pressures have been intermittently soft. Had some chest discomfort this morning. He has had various complaints of chest discomfort over the past week. It does not sound particularly ischemic.  Plan is for conservative medical therapy of heart failure and CAD with placement in a skilled nursing facility oriented in environment where he will have more support.  He is ready for discharge within the next 24-48 hours depending upon discharge planning.  We will continue to monitor. No medication titration done today.

## 2016-07-23 NOTE — Progress Notes (Signed)
PROGRESS NOTE    Vincent Dunlap  PTW:[id number] DOB: [birth year] DOA: 07/17/2016 PCP: Thressa Sheller, MD   Brief Narrative:  75 y.o. male PMHx HTN and Tobacco Abuse,Chronic Pain (on oxycontine),  Gout   Patient presents to the emergency department with a two-week history of progressive dyspnea, worse with even minimal exertion. EMS found patient to be hypoxic, placed on CPAP.  Denies orthopnea.  No cough. No known fevers. No history of pneumonia nor heart failure. Patient is compliant with blood pressure medications at home. He doesn't add salt to food but does consume a lot of canned foods. He reports recent onset of lower extremity edema. He is dizzy with walking and standing.     Subjective: Had one episode of chest pain this am. He is chest pain free at this time.    Assessment & Plan:   Principal Problem:   Acute respiratory failure with hypoxia (HCC) Active Problems:   Hypertension   Dizziness   CAD (coronary artery disease), native coronary artery   NSTEMI (non-ST elevated myocardial infarction) (HCC)   Pressure ulcer   Acute systolic CHF (congestive heart failure) (HCC)   Chronic pain syndrome   Exposure to potentially harmful entity   Acute coronary syndrome (HCC)   COPD exacerbation (HCC)   Chronic idiopathic thrombocytopenia (HCC)   Acute hypoxic respiratory failure due to Acute exacerbation of Systolic CHF -EF 70-01% via TTE this admit  - no ACEi/ARB due to hypotension -Strict in and out since admission -5.6 L -Daily weigh [facility] Weights   07/21/16 0329 07/22/16 0500 07/23/16 0612  Weight: 71.7 kg (158 lb) 70.4 kg (155 lb 3.2 oz) 71 kg (156 lb 9.6 oz)  -9/11 right/left heart cath: Multivessel disease requires CABG see results below -Per cardiothoracic surgery note patient is not a good candidate for CABG. Medical management only -lisinopril, spironolactone.   Non-STEMI -Unable to tolerate IV nitro due to hypotension (remains relatively hypotensive)  -Coreg  6.25 mg BID -Lisinopril 2.5 mg daily -Might benefit from nitrate (Imdur) but currently remains borderline hypotensive. Defer to cardiology  Chronic pain syndrome -on chronic oxycodone - holding at present due to hypotension   - no evidence of uncontrolled pain. 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  Severe multivessel CAD with 70% distal left main stenosis, 70% proximal LAD, diagonal 1 and diagonal 2 stenoses with 90% septal perforating artery stenoses; 75% proximal left circumflex stenoses, and total occlusion of the very proximal RCA with significant left to right collaterals.  Cultures   Antimicrobials: Zosyn 9/9>> 1 dose Vancomycin 9/9>> 1 dose   Devices    LINES / TUBES:      Continuous Infusions:     Objective: Vitals:   07/22/16 1400 07/22/16 2241 07/23/16 0612 07/23/16 1434  BP: 107/70 103/66 130/72 (!) 92/52  Pulse:  79 91 78  Resp: 16 18 18 18   Temp:  97.6 F (36.4 C) 97.6 F (36.4 C) 98 F (36.7 C)  TempSrc:  Oral Oral Oral  SpO2:  96% 100% 100%  Weight:   71 kg (156 lb 9.6 oz)   Height:        Intake/Output Summary (Last 24 hours) at 07/23/16 1443 Last data filed at 07/23/16 1214  Gross per 24 hour  Intake              120 ml  Output              1075 ml  Net             -955 ml   Filed Weights   07/21/16 0329 07/22/16 0500 07/23/16 0612  Weight: 71.7 kg (158 lb) 70.4 kg (155 lb 3.2 oz) 71 kg (156 lb 9.6 oz)    Examination:  General: A/O 3 Eyes: negative scleral hemorrhage, negative anisocoria, negative icterus ENT: Negative Runny nose, negative gingival bleeding, Neck:  Negative scars, masses, torticollis, lymphadenopathy, JVD Lungs: Clear to auscultation bilaterally without wheezes or crackles Cardiovascular: Regular rate and rhythm without murmur gallop or rub normal S1 and S2 Abdomen: negative abdominal pain, nondistended, positive soft, bowel sounds, no rebound, no ascites, no appreciable mass Extremities: No significant cyanosis, clubbing, or edema bilateral lower extremities Skin: Bilateral heel pressure ulcers (negative sign of infection) Central nervous system: non focal.   .  CBC:  Recent Labs Lab 07/17/16 0705 07/18/16 0200 07/19/16 0302 07/20/16 0556 07/21/16 0222 07/22/16 0318  WBC 14.8* 9.0 13.0* 10.5 11.3* 10.8*  NEUTROABS 11.8*  --  7.4  --   --   --   HGB 12.1* 10.0* 10.9* 10.0* 10.0* 10.0*  HCT 36.8* 30.7* 33.7* 30.7* 29.8* 31.2*  MCV 90.0 89.0 90.6 90.3 89.2 90.4  PLT 180 161 148* 121* 116* 932*   Basic Metabolic Panel:  Recent Labs Lab 07/18/16 0200 07/19/16 0302 07/20/16 0556 07/22/16 0318 07/23/16 0225  NA 132* 130* 130* 131* 131*  K 3.4* 3.8 4.1 3.9 4.0  CL 95* 94* 98* 100* 99*  CO2 28 28 25 25 24   GLUCOSE 97 101* 100* 104* 102*  BUN 12 16 9  5* 9  CREATININE 0.70 0.66 0.60* 0.62 0.67  CALCIUM 8.3* 8.5* 8.3* 8.7* 8.8*  MG  --  1.4* 1.8  --   --    GFR: Estimated Creatinine Clearance: 81.4 mL/min (by C-G formula based on SCr of 0.67 mg/dL). Liver Function Tests:  Recent Labs Lab 07/17/16 0705 07/19/16 0302 07/20/16 0556  AST 38 27 22  ALT 19 15* 15*  ALKPHOS 79 65 58  BILITOT 1.0 0.6 0.5  PROT 6.3* 5.5* 5.1*  ALBUMIN 3.4* 3.2* 2.8*   No results for  input(s): LIPASE, AMYLASE in the last 168 hours. No results for input(s): AMMONIA in the last 168 hours. Coagulation Profile:  Recent Labs Lab 07/19/16 0301  INR 1.10   Cardiac Enzymes:  Recent Labs Lab 07/17/16 1025 07/17/16 1459 07/17/16 2132  TROPONINI 0.20* 0.35* 0.50*   BNP (last 3 results) No results for input(s): PROBNP in the last 8760 hours. HbA1C: No results for input(s): HGBA1C in the last 72 hours. CBG:  Recent Labs Lab 07/19/16 2028  GLUCAP 143*   Lipid Profile: No results for input(s): CHOL, HDL, LDLCALC, TRIG, CHOLHDL, LDLDIRECT in the last 72 hours. Thyroid Function Tests: No results for input(s): TSH, T4TOTAL, FREET4, T3FREE, THYROIDAB in the last 72 hours. Anemia Panel:  Recent Labs  07/22/16 0318  VITAMINB12 220  FOLATE 11.3   Urine analysis:    Component Value Date/Time   COLORURINE YELLOW 07/17/2016 1107   APPEARANCEUR CLEAR 07/17/2016 1107   LABSPEC 1.016 07/17/2016 1107   PHURINE 5.5 07/17/2016 1107   GLUCOSEU NEGATIVE 07/17/2016 1107   HGBUR NEGATIVE 07/17/2016 1107   BILIRUBINUR NEGATIVE 07/17/2016 1107   KETONESUR 15 (A) 07/17/2016 1107   PROTEINUR NEGATIVE 07/17/2016 1107   UROBILINOGEN 1.0 07/29/2009 0002   NITRITE NEGATIVE 07/17/2016 1107   LEUKOCYTESUR NEGATIVE 07/17/2016 1107   Sepsis Labs: @LABRCNTIP (procalcitonin:4,lacticidven:4)  ) Recent Results (from the past 240 hour(s))  Culture, blood (Routine X 2) w Reflex to ID Panel     Status: None   Collection Time: 07/17/16  8:30 AM  Result Value Ref Range Status   Specimen Description BLOOD RIGHT ANTECUBITAL  Final   Special Requests BOTTLES DRAWN AEROBIC AND ANAEROBIC 5CC  Final   Culture NO GROWTH 5 DAYS  Final   Report Status 07/22/2016 FINAL  Final  Culture, blood (Routine X 2) w Reflex to ID Panel     Status: None   Collection Time: 07/17/16  8:40 AM  Result Value Ref Range Status   Specimen Description BLOOD LEFT HAND  Final   Special Requests BOTTLES DRAWN  AEROBIC AND ANAEROBIC 5CC  Final   Culture NO GROWTH 5 DAYS  Final   Report Status 07/22/2016 FINAL  Final  MRSA PCR Screening     Status: None   Collection Time: 07/17/16 12:34 PM  Result Value Ref Range Status   MRSA by PCR NEGATIVE NEGATIVE Final    Comment:        The GeneXpert MRSA Assay (FDA approved for NASAL specimens only), is one component of a comprehensive MRSA colonization surveillance program. It is not intended to diagnose MRSA infection nor to guide or monitor treatment for MRSA infections.          Radiology Studies: No results found.      Scheduled Meds: . aspirin  81 mg Oral Daily  . atorvastatin  80 mg Oral q1800  . carvedilol  6.25 mg Oral BID WC  . folic acid  1 mg Oral Daily  . [START ON 07/24/2016] Influenza vac split quadrivalent PF  0.5 mL Intramuscular Tomorrow-1000  . lisinopril  2.5 mg Oral Daily  . mouth rinse  15 mL Mouth Rinse BID  . multivitamin with minerals  1 tablet Oral Daily  . spironolactone  12.5 mg Oral Daily  . thiamine  100 mg Oral Daily   Continuous Infusions:     LOS: 6 days    Time spent: 40 minutes    Elmarie Shiley, MD [facility] Pager [phone]  If 7PM-7AM, please contact night-coverage [email or web address] Password [facility] 07/23/2016, 2:43 PM

## 2016-07-24 LAB — BASIC METABOLIC PANEL
Anion gap: 6 (ref 5–15)
BUN: 8 mg/dL (ref 6–20)
CALCIUM: 8.8 mg/dL — AB (ref 8.9–10.3)
CHLORIDE: 101 mmol/L (ref 101–111)
CO2: 22 mmol/L (ref 22–32)
Creatinine, Ser: 0.52 mg/dL — ABNORMAL LOW (ref 0.61–1.24)
GFR calc Af Amer: >60 mL/min (ref >60)
GFR calc non Af Amer: >60 mL/min (ref >60)
GLUCOSE: 104 mg/dL — AB (ref 65–99)
POTASSIUM: 4 mmol/L (ref 3.5–5.1)
Sodium: 129 mmol/L — ABNORMAL LOW (ref 135–145)

## 2016-07-24 LAB — GLUCOSE, CAPILLARY: Glucose-Capillary: 106 mg/dL — ABNORMAL HIGH (ref 65–99)

## 2016-07-24 MED ORDER — ISOSORBIDE MONONITRATE ER 30 MG PO TB24
30.0000 mg | ORAL_TABLET | ORAL | Status: DC
  Administered 2016-07-24 – 2016-07-25 (×2): 30 mg via ORAL
  Filled 2016-07-24 (×2): qty 1

## 2016-07-24 NOTE — Progress Notes (Signed)
Patient Profile: 75 year old Caucasian male with h/o HTN and HLD who presented to the hospital with complaints of chest pain, dizziness, and shortness of breath. An echo Doppler study revealed an ejection fraction of 30 - 35%. He was referred for right and left heart cardiac catheterization. Found to have sever multivessel CAD. Seen by CV surgery and deemed not a surgical candidate for CABG due to deconditioning and other comorbidities . Plan is for medical management.   Subjective: Currently asymptomatic, Feeling well this AM without complaint.  Objective: Vital signs in last 24 hours: Temp:  [98 F (36.7 C)-98.2 F (36.8 C)] 98 F (36.7 C) (09/16 0423) Pulse Rate:  [68-78] 68 (09/16 0423) Resp:  [16-18] 16 (09/16 0423) BP: (92-107)/(52-64) 107/54 (09/16 0423) SpO2:  [97 %-100 %] 97 % (09/16 0423) Weight:  [157 lb 4.8 oz (71.4 kg)] 157 lb 4.8 oz (71.4 kg) (09/16 0556) Last BM Date: 07/23/16  Intake/Output from previous day: 09/15 0701 - 09/16 0700 In: 240 [P.O.:240] Out: 1525 [Urine:1525] Intake/Output this shift: Total I/O In: 400 [P.O.:400] Out: 250 [Urine:250]  Medications Current Facility-Administered Medications  Medication Dose Route Frequency Provider Last Rate Last Dose  . acetaminophen (TYLENOL) tablet 650 mg  650 mg Oral Q4H PRN Willia Craze, NP   650 mg at 07/24/16 0942  . aspirin chewable tablet 81 mg  81 mg Oral Daily Cherene Altes, MD   81 mg at 07/24/16 0943  . atorvastatin (LIPITOR) tablet 80 mg  80 mg Oral q1800 Troy Sine, MD   80 mg at 07/23/16 1908  . carvedilol (COREG) tablet 6.25 mg  6.25 mg Oral BID WC Belva Crome, MD   6.25 mg at 07/24/16 0944  . folic acid (FOLVITE) tablet 1 mg  1 mg Oral Daily Einar Grad, RPH   1 mg at 07/24/16 0944  . Influenza vac split quadrivalent PF (FLUARIX) injection 0.5 mL  0.5 mL Intramuscular Tomorrow-1000 Belkys A Regalado, MD      . lisinopril (PRINIVIL,ZESTRIL) tablet 2.5 mg  2.5 mg Oral Daily  Belva Crome, MD   2.5 mg at 07/24/16 0946  . LORazepam (ATIVAN) injection 2-3 mg  2-3 mg Intravenous Q1H PRN Jeryl Columbia, NP   2 mg at 07/22/16 1231  . MEDLINE mouth rinse  15 mL Mouth Rinse BID Kinnie Feil, MD   15 mL at 07/24/16 1000  . multivitamin with minerals tablet 1 tablet  1 tablet Oral Daily Einar Grad, [facility]   1 tablet at 07/24/16 1017  . nitroGLYCERIN (NITROSTAT) SL tablet 0.4 mg  0.4 mg Sublingual Q5 min PRN Almyra Deforest, PA   0.4 mg at 07/19/16 1740  . ondansetron (ZOFRAN) injection 4 mg  4 mg Intravenous Q6H PRN Troy Sine, MD      . spironolactone (ALDACTONE) tablet 12.5 mg  12.5 mg Oral Daily Belva Crome, MD   12.5 mg at 07/24/16 0944  . thiamine (VITAMIN B-1) tablet 100 mg  100 mg Oral Daily Einar Grad, RPH   100 mg at 07/24/16 0946  . traMADol (ULTRAM) tablet 50 mg  50 mg Oral Q6H PRN Lavina Hamman, MD   50 mg at 07/24/16 0421    PE: General appearance: alert, cooperative and no distress Neck: no carotid bruit and no JVD Lungs: clear to auscultation bilaterally Heart: regular rate and rhythm, S1, S2 normal, no murmur, click, rub or gallop Extremities: no LEe Pulses: 2+ and symmetric Skin:  warm and dry Neurologic: Grossly normal  Lab Results:   Recent Labs  07/22/16 0318  WBC 10.8*  HGB 10.0*  HCT 31.2*  PLT 115*   BMET  Recent Labs  07/22/16 0318 07/23/16 0225 07/24/16 0425  NA 131* 131* 129*  K 3.9 4.0 4.0  CL 100* 99* 101  CO2 25 24 22   GLUCOSE 104* 102* 104*  BUN 5* 9 8  CREATININE 0.62 0.67 0.52*  CALCIUM 8.7* 8.8* 8.8*   Cardiac Panel (last 3 results) No results for input(s): CKTOTAL, CKMB, TROPONINI, RELINDX in the last 72 hours.  Studies/Results: Procedures   Right/Left Heart Cath and Coronary Angiography  Conclusion     Ost RCA lesion, 95 %stenosed.  Prox RCA lesion, 100 %stenosed.  Ost LM to LM lesion, 70 %stenosed.  Ost Cx to Prox Cx lesion, 75 %stenosed.  Ost LAD to Prox LAD lesion, 70  %stenosed.  Ost 1st Diag to 1st Diag lesion, 70 %stenosed.  Ost 2nd Diag to 2nd Diag lesion, 70 %stenosed.  Ost 1st Sept to 1st Sept lesion, 90 %stenosed.  Hemodynamic findings consistent with mild pulmonary hypertension.   Severe ischemic cardiomyopathy with diffuse hypocontractility and hypo-to akinesis in the mid- basal inferior wall with a global ejection fraction of of 25-30%.  Severe multivessel CAD with 70% distal left main stenosis, 70% proximal LAD, diagonal 1 and diagonal 2 stenoses with 90% septal perforating artery stenoses; 75% proximal left circumflex stenoses, and total occlusion of the very proximal RCA with significant left to right collaterals.    2D Echo  Study Conclusions  - Left ventricle: The cavity size was normal. Wall thickness was   increased in a pattern of mild LVH. Systolic function was   moderately to severely reduced. The estimated ejection fraction   was in the range of 30% to 35%. Akinesis of the   mid-apicalanteroseptal myocardium. There was fusion of early and   atrial contributions to ventricular filling. - Right atrium: The atrium was mildly dilated. - Pulmonary arteries: Systolic pressure was mildly increased. PA   peak pressure: 37 mm Hg (S).  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)   1. Multivessel CAD: this includes LM involvement. Seen by CV surgery and deemed not a surgical candidate for CABG due to deconditioning and other comorbidities. Plan is for medical management. He remains CP free on  current regimen which includes ASA, high intensity statin, BB, ACE-I and Aldactone.  Francetta Ilg add small dose of Imdur as he has had episodes of repeat chest pain.  2. Ischemic Cardiomyopathy/ Systolic CHF: EF 69%. Volume appears stable. Continue optimization of HF therapy meds based on guidelines. Continue BB therapy with Coreg, ACE-I therapy with Lisinopril + Aldactone. Low sodium diet.  Virlee Stroschein likely need HF education.    LOS: 7 days    Tilly Pernice Curt Bears, MD 07/24/2016 11:13 AM

## 2016-07-24 NOTE — Progress Notes (Signed)
PROGRESS NOTE    Vincent Dunlap  BTD:[id number] DOB: May 19, 1941 DOA: 07/17/2016 PCP: Thressa Sheller, MD   Brief Narrative:  75 y.o. male PMHx HTN and Tobacco Abuse,Chronic Pain (on oxycontine),  Gout   Patient presents to the emergency department with a two-week history of progressive dyspnea, worse with even minimal exertion. EMS found patient to be hypoxic, placed on CPAP.  Denies orthopnea.  No cough. No known fevers. No history of pneumonia nor heart failure. Patient is compliant with blood pressure medications at home. He doesn't add salt to food but does consume a lot of canned foods. He reports recent onset of lower extremity edema. He is dizzy with walking and standing.     Subjective: He is feeling ok, denies chest pain.    Assessment & Plan:   Principal Problem:   Acute respiratory failure with hypoxia (HCC) Active Problems:   Hypertension   Dizziness   CAD (coronary artery disease), native coronary artery   NSTEMI (non-ST elevated myocardial infarction) (HCC)   Pressure ulcer   Acute systolic CHF (congestive heart failure) (HCC)   Chronic pain syndrome   Exposure to potentially harmful entity   Acute coronary syndrome (HCC)   COPD exacerbation (HCC)   Chronic idiopathic thrombocytopenia (HCC)   Acute hypoxic respiratory failure due to Acute exacerbation of Systolic CHF -EF 10-62% via TTE this admit  - no ACEi/ARB due to hypotension -Strict in and out since admission -6 L -Daily weigh [facility] Weights   07/22/16 0500 07/23/16 0612 07/24/16 0556  Weight: 70.4 kg (155 lb 3.2 oz) 71 kg (156 lb 9.6 oz) 71.4 kg (157 lb 4.8 oz)  -9/11 right/left heart cath: Multivessel disease requires CABG see results below -Per cardiothoracic surgery note patient is not a good candidate for CABG. Medical management only -lisinopril, spironolactone.   Non-STEMI -Unable to tolerate IV nitro due to hypotension (remains relatively hypotensive)  -Coreg 6.25 mg BID -Lisinopril 2.5 mg  daily -monitor today on low dose imdur.   Chronic pain syndrome -on chronic oxycodone - holding at present due to hypotension   - no evidence of uncontrolled pain. Has PRN tramadol   Hypotension -care with blood pressure medications and diuresis  -monitor BP on Imdur.   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    Consultants:  [facility] Cards   Procedures/Significant Events:  TTE 9/9 9/11 Right/Left heart cath:Severe ischemic cardiomyopathy with diffuse hypocontractility and hypo-to akinesis in the mid- basal inferior wall with a global ejection fraction of of 25-30%.  Severe multivessel CAD with 70% distal left main stenosis, 70% proximal LAD, diagonal 1 and diagonal 2 stenoses with 90% septal perforating artery stenoses; 75% proximal left circumflex stenoses, and total occlusion of the very proximal RCA with significant left to right collaterals.  Cultures   Antimicrobials: Zosyn 9/9>> 1 dose Vancomycin 9/9>> 1 dose   Devices    LINES / TUBES:      Continuous Infusions:     Objective: Vitals:   07/23/16 2217 07/24/16 0423 07/24/16 0556 07/24/16 1357  BP: 105/64 (!) 107/54  (!) 104/54  Pulse: 72 68  73  Resp: 18 16  20  Temp: 98.2 F (36.8 C) 98 F (36.7 C)  97.7 F (36.5 C)  TempSrc: Oral Axillary  Oral  SpO2: 100% 97%  100%  Weight:   71.4 kg (157 lb 4.8 oz)   Height:        Intake/Output Summary (Last 24 hours) at 07/24/16 1431 Last data filed at 07/24/16 0857  Gross per 24 hour  Intake              640 ml  Output             1000 ml  Net             -360 ml   Filed Weights   07/22/16 0500 07/23/16  0612 07/24/16 0556  Weight: 70.4 kg (155 lb 3.2 oz) 71 kg (156 lb 9.6 oz) 71.4 kg (157 lb 4.8 oz)    Examination:  General: A/O 3 Eyes: negative scleral hemorrhage, negative anisocoria, negative icterus ENT: Negative Runny nose, negative gingival bleeding, Neck:  Negative scars, masses, torticollis, lymphadenopathy, JVD Lungs: Clear to auscultation bilaterally without wheezes or crackles Cardiovascular: Regular rate and rhythm without murmur gallop or rub normal S1 and S2 Abdomen: negative abdominal pain, nondistended, positive soft, bowel sounds, no rebound, no ascites, no appreciable mass Extremities: No significant cyanosis, clubbing, or edema bilateral lower extremities Skin: Bilateral heel pressure ulcers (negative sign of infection) Central nervous system: non focal.   .  CBC:  Recent Labs Lab 07/18/16 0200 07/19/16 0302 07/20/16 0556 07/21/16 0222 07/22/16 0318  WBC 9.0 13.0* 10.5 11.3* 10.8*  NEUTROABS  --  7.4  --   --   --   HGB 10.0* 10.9* 10.0* 10.0* 10.0*  HCT 30.7* 33.7* 30.7* 29.8* 31.2*  MCV 89.0 90.6 90.3 89.2 90.4  PLT 161 148* 121* 116* 106*   Basic Metabolic Panel:  Recent Labs Lab 07/19/16 0302 07/20/16 0556 07/22/16 0318 07/23/16 0225 07/24/16 0425  NA 130* 130* 131* 131* 129*  K 3.8 4.1 3.9 4.0 4.0  CL 94* 98* 100* 99* 101  CO2 28 25 25 24 22   GLUCOSE 101* 100* 104* 102* 104*  BUN 16 9 5* 9 8  CREATININE 0.66 0.60* 0.62 0.67 0.52*  CALCIUM 8.5* 8.3* 8.7* 8.8* 8.8*  MG 1.4* 1.8  --   --   --    GFR: Estimated Creatinine Clearance: 81.8 mL/min (by C-G formula based on SCr of 0.52 mg/dL (L)). Liver Function Tests:  Recent Labs Lab 07/19/16 0302 07/20/16 0556  AST 27 22  ALT 15* 15*  ALKPHOS 65 58  BILITOT 0.6 0.5  PROT 5.5* 5.1*  ALBUMIN 3.2* 2.8*   No results for input(s): LIPASE, AMYLASE in the last 168 hours. No results for input(s): AMMONIA in the last 168 hours. Coagulation Profile:  Recent Labs Lab 07/19/16 0301    INR 1.10   Cardiac Enzymes:  Recent Labs Lab 07/17/16 1459 07/17/16 2132  TROPONINI 0.35* 0.50*   BNP (last 3 results) No results for input(s): PROBNP in the last 8760 hours. HbA1C: No results for input(s): HGBA1C in the last 72 hours. CBG:  Recent Labs Lab 07/19/16 2028 07/24/16 1205  GLUCAP 143* 106*   Lipid Profile: No results for input(s): CHOL, HDL, LDLCALC, TRIG, CHOLHDL, LDLDIRECT in the last 72 hours. Thyroid Function Tests: No results for input(s): TSH, T4TOTAL, FREET4, T3FREE, THYROIDAB in the last 72 hours. Anemia Panel:  Recent Labs  07/22/16 0318  VITAMINB12 220  FOLATE 11.3   Urine analysis:  Component Value Date/Time   COLORURINE YELLOW 07/17/2016 1107   APPEARANCEUR CLEAR 07/17/2016 1107   LABSPEC 1.016 07/17/2016 1107   PHURINE 5.5 07/17/2016 1107   GLUCOSEU NEGATIVE 07/17/2016 1107   HGBUR NEGATIVE 07/17/2016 1107   BILIRUBINUR NEGATIVE 07/17/2016 1107   KETONESUR 15 (A) 07/17/2016 1107   PROTEINUR NEGATIVE 07/17/2016 1107   UROBILINOGEN 1.0 07/29/2009 0002   NITRITE NEGATIVE 07/17/2016 1107   LEUKOCYTESUR NEGATIVE 07/17/2016 1107   Sepsis Labs: @LABRCNTIP (procalcitonin:4,lacticidven:4)  ) Recent Results (from the past 240 hour(s))  Culture, blood (Routine X 2) w Reflex to ID Panel     Status: None   Collection Time: 07/17/16  8:30 AM  Result Value Ref Range Status   Specimen Description BLOOD RIGHT ANTECUBITAL  Final   Special Requests BOTTLES DRAWN AEROBIC AND ANAEROBIC 5CC  Final   Culture NO GROWTH 5 DAYS  Final   Report Status 07/22/2016 FINAL  Final  Culture, blood (Routine X 2) w Reflex to ID Panel     Status: None   Collection Time: 07/17/16  8:40 AM  Result Value Ref Range Status   Specimen Description BLOOD LEFT HAND  Final   Special Requests BOTTLES DRAWN AEROBIC AND ANAEROBIC 5CC  Final   Culture NO GROWTH 5 DAYS  Final   Report Status 07/22/2016 FINAL  Final  MRSA PCR Screening     Status: None   Collection Time:  07/17/16 12:34 PM  Result Value Ref Range Status   MRSA by PCR NEGATIVE NEGATIVE Final    Comment:        The GeneXpert MRSA Assay (FDA approved for NASAL specimens only), is one component of a comprehensive MRSA colonization surveillance program. It is not intended to diagnose MRSA infection nor to guide or monitor treatment for MRSA infections.          Radiology Studies: No results found.      Scheduled Meds: . aspirin  81 mg Oral Daily  . atorvastatin  80 mg Oral q1800  . carvedilol  6.25 mg Oral BID WC  . folic acid  1 mg Oral Daily  . Influenza vac split quadrivalent PF  0.5 mL Intramuscular Tomorrow-1000  . isosorbide mononitrate  30 mg Oral Q24H  . lisinopril  2.5 mg Oral Daily  . mouth rinse  15 mL Mouth Rinse BID  . multivitamin with minerals  1 tablet Oral Daily  . spironolactone  12.5 mg Oral Daily  . thiamine  100 mg Oral Daily   Continuous Infusions:     LOS: 7 days    Time spent: 40 minutes    Elmarie Shiley, MD [facility] Pager [phone]  If 7PM-7AM, please contact night-coverage [email or web address] Password [facility] 07/24/2016, 2:31 PM

## 2016-07-25 LAB — BASIC METABOLIC PANEL
ANION GAP: 7 (ref 5–15)
BUN: 10 mg/dL (ref 6–20)
CHLORIDE: 99 mmol/L — AB (ref 101–111)
CO2: 22 mmol/L (ref 22–32)
Calcium: 8.7 mg/dL — ABNORMAL LOW (ref 8.9–10.3)
Creatinine, Ser: 0.63 mg/dL (ref 0.61–1.24)
GFR calc Af Amer: >60 mL/min (ref >60)
Glucose, Bld: 96 mg/dL (ref 65–99)
POTASSIUM: 4.3 mmol/L (ref 3.5–5.1)
SODIUM: 128 mmol/L — AB (ref 135–145)

## 2016-07-25 MED ORDER — ISOSORBIDE MONONITRATE ER 30 MG PO TB24: 30.0000 mg | ORAL_TABLET | ORAL | 0 refills | Status: DC

## 2016-07-25 MED ORDER — ASPIRIN 81 MG PO CHEW: 81.0000 mg | CHEWABLE_TABLET | Freq: Every day | ORAL | 0 refills | Status: DC

## 2016-07-25 MED ORDER — CARVEDILOL 6.25 MG PO TABS: 6.2500 mg | ORAL_TABLET | Freq: Two times a day (BID) | ORAL | 0 refills | Status: AC

## 2016-07-25 MED ORDER — TRAMADOL HCL 50 MG PO TABS: 50.0000 mg | ORAL_TABLET | Freq: Four times a day (QID) | ORAL | 0 refills | Status: DC | PRN

## 2016-07-25 MED ORDER — NITROGLYCERIN 0.4 MG SL SUBL: 0.4000 mg | SUBLINGUAL_TABLET | SUBLINGUAL | 0 refills | Status: AC | PRN

## 2016-07-25 MED ORDER — ATORVASTATIN CALCIUM 80 MG PO TABS: 80.0000 mg | ORAL_TABLET | Freq: Every day | ORAL | 0 refills | Status: DC

## 2016-07-25 MED ORDER — ADULT MULTIVITAMIN W/MINERALS CH: 1.0000 | ORAL_TABLET | Freq: Every day | ORAL | 0 refills | Status: DC

## 2016-07-25 MED ORDER — SPIRONOLACTONE 25 MG PO TABS: 12.5000 mg | ORAL_TABLET | Freq: Every day | ORAL | 0 refills | Status: DC

## 2016-07-25 MED ORDER — LISINOPRIL 2.5 MG PO TABS: 2.5000 mg | ORAL_TABLET | Freq: Every day | ORAL | 0 refills | Status: DC

## 2016-07-25 NOTE — Progress Notes (Signed)
Order to discharge received.  Pt meds retrieved from pharmacy and are back in his possession, per receipt form on chart.  Pt has a bag of clothes and belongings (triple bagged) that are unable to be transferred to facility, per their instructions, due to having bed bugs on admission.  He states that he does not know what all is in the bag.  Pt states he does not know of anyone that can come and pick them up, however when I was in the room a friend called and he did not mention anything to her regarding needing someone to pick up.  He did not offer any other suggestions.  Pt aware that once he is transferred, any belongings left behind are not the responsibility of Vincent Dunlap and they may be discarded if no one arrives to pick them up.  He states that he understands.

## 2016-07-25 NOTE — Progress Notes (Signed)
Patient Profile: 75 year old Caucasian male with h/o HTN and HLD who presented to the hospital with complaints of chest pain, dizziness, and shortness of breath. An echo Doppler study revealed an ejection fraction of 30 - 35%. He was referred for right and left heart cardiac catheterization. Found to have sever multivessel CAD. Seen by CV surgery and deemed not a surgical candidate for CABG due to deconditioning and other comorbidities . Plan is for medical management.   Subjective: Currently asymptomatic.  Had no chest pain overnight.  Imdur added.  BP stable.  Objective: Vital signs in last 24 hours: Temp:  [97.7 F (36.5 C)-98.4 F (36.9 C)] 97.8 F (36.6 C) (09/17 0504) Pulse Rate:  [66-73] 66 (09/17 0504) Resp:  [13-20] 16 (09/17 0504) BP: (97-110)/(54-81) 110/81 (09/17 0504) SpO2:  [98 %-100 %] 98 % (09/17 0504) Weight:  [154 lb 5.2 oz (70 kg)] 154 lb 5.2 oz (70 kg) (09/17 0504) Last BM Date: 07/23/16  Intake/Output from previous day: 09/16 0701 - 09/17 0700 In: 400 [P.O.:400] Out: 1975 [Urine:1975] Intake/Output this shift: No intake/output data recorded.  Medications Current Facility-Administered Medications  Medication Dose Route Frequency Provider Last Rate Last Dose  . acetaminophen (TYLENOL) tablet 650 mg  650 mg Oral Q4H PRN Willia Craze, NP   650 mg at 07/24/16 0942  . aspirin chewable tablet 81 mg  81 mg Oral Daily Cherene Altes, MD   81 mg at 07/25/16 0834  . atorvastatin (LIPITOR) tablet 80 mg  80 mg Oral q1800 Troy Sine, MD   80 mg at 07/24/16 1819  . carvedilol (COREG) tablet 6.25 mg  6.25 mg Oral BID WC Belva Crome, MD   6.25 mg at 07/25/16 0834  . folic acid (FOLVITE) tablet 1 mg  1 mg Oral Daily Einar Grad, RPH   1 mg at 07/25/16 6195  . Influenza vac split quadrivalent PF (FLUARIX) injection 0.5 mL  0.5 mL Intramuscular Tomorrow-1000 Belkys A Regalado, MD      . isosorbide mononitrate (IMDUR) 24 hr tablet 30 mg  30 mg Oral Q24H  Iyonna Rish Meredith Leeds, MD   30 mg at 07/24/16 1209  . lisinopril (PRINIVIL,ZESTRIL) tablet 2.5 mg  2.5 mg Oral Daily Belva Crome, MD   2.5 mg at 07/25/16 0834  . LORazepam (ATIVAN) injection 2-3 mg  2-3 mg Intravenous Q1H PRN Jeryl Columbia, NP   2 mg at 07/22/16 1231  . MEDLINE mouth rinse  15 mL Mouth Rinse BID Kinnie Feil, MD   15 mL at 07/24/16 1000  . multivitamin with minerals tablet 1 tablet  1 tablet Oral Daily Einar Grad, [facility]   1 tablet at 07/25/16 0932  . nitroGLYCERIN (NITROSTAT) SL tablet 0.4 mg  0.4 mg Sublingual Q5 min PRN Almyra Deforest, PA   0.4 mg at 07/19/16 1740  . ondansetron (ZOFRAN) injection 4 mg  4 mg Intravenous Q6H PRN Troy Sine, MD      . spironolactone (ALDACTONE) tablet 12.5 mg  12.5 mg Oral Daily Belva Crome, MD   12.5 mg at 07/25/16 0834  . thiamine (VITAMIN B-1) tablet 100 mg  100 mg Oral Daily Einar Grad, RPH   100 mg at 07/25/16 6712  . traMADol (ULTRAM) tablet 50 mg  50 mg Oral Q6H PRN Lavina Hamman, MD   50 mg at 07/25/16 0956    PE: General appearance: alert, cooperative and no distress Neck: no carotid bruit and no  JVD Lungs: clear to auscultation bilaterally Heart: regular rate and rhythm, S1, S2 normal, no murmur, click, rub or gallop Extremities: no LEe Pulses: 2+ and symmetric Skin: warm and dry Neurologic: Grossly normal  Lab Results:  No results for input(s): WBC, HGB, HCT, PLT in the last 72 hours. BMET  Recent Labs  07/23/16 0225 07/24/16 0425 07/25/16 0408  NA 131* 129* 128*  K 4.0 4.0 4.3  CL 99* 101 99*  CO2 24 22 22   GLUCOSE 102* 104* 96  BUN 9 8 10   CREATININE 0.67 0.52* 0.63  CALCIUM 8.8* 8.8* 8.7*   Cardiac Panel (last 3 results) No results for input(s): CKTOTAL, CKMB, TROPONINI, RELINDX in the last 72 hours.  Studies/Results: Procedures   Right/Left Heart Cath and Coronary Angiography  Conclusion     Ost RCA lesion, 95 %stenosed.  Prox RCA lesion, 100 %stenosed.  Ost LM to LM lesion,  70 %stenosed.  Ost Cx to Prox Cx lesion, 75 %stenosed.  Ost LAD to Prox LAD lesion, 70 %stenosed.  Ost 1st Diag to 1st Diag lesion, 70 %stenosed.  Ost 2nd Diag to 2nd Diag lesion, 70 %stenosed.  Ost 1st Sept to 1st Sept lesion, 90 %stenosed.  Hemodynamic findings consistent with mild pulmonary hypertension.   Severe ischemic cardiomyopathy with diffuse hypocontractility and hypo-to akinesis in the mid- basal inferior wall with a global ejection fraction of of 25-30%.  Severe multivessel CAD with 70% distal left main stenosis, 70% proximal LAD, diagonal 1 and diagonal 2 stenoses with 90% septal perforating artery stenoses; 75% proximal left circumflex stenoses, and total occlusion of the very proximal RCA with significant left to right collaterals.    2D Echo  Study Conclusions  - Left ventricle: The cavity size was normal. Wall thickness was   increased in a pattern of mild LVH. Systolic function was   moderately to severely reduced. The estimated ejection fraction   was in the range of 30% to 35%. Akinesis of the   mid-apicalanteroseptal myocardium. There was fusion of early and   atrial contributions to ventricular filling. - Right atrium: The atrium was mildly dilated. - Pulmonary arteries: Systolic pressure was mildly increased. PA   peak pressure: 37 mm Hg (S).  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 Multivessel CAD: this includes LM involvement. Seen by CV surgery and deemed not a surgical candidate for CABG due to  deconditioning and other comorbidities. Plan is for medical management. He remains CP free on current regimen which includes ASA, high intensity statin, BB, ACE-I and Aldactone.  Added Imdur yesterday and no further pain today.  2. Ischemic Cardiomyopathy/ Systolic CHF: EF 10%. Volume appears stable. Continue optimization of HF therapy meds based on guidelines. Continue BB therapy with Coreg, ACE-I therapy with Lisinopril + Aldactone. Low sodium diet.  Zola Runion likely need HF education.    LOS: 8 days    Filomeno Cromley Curt Bears, MD 07/25/2016 10:13 AM

## 2016-07-25 NOTE — Progress Notes (Signed)
Voicemail left with social work [phone]) to notify we have D/C to SNF order.

## 2016-07-25 NOTE — Progress Notes (Signed)
Per Butch Penny with CSW, pt has SNF placement at [facility], and [facility] scheduled for pickup at 1330.  She asked that I call report at that time due to their pt coordinator will have notified the unit after she gets out of church.  Number for report [phone], [place], Edwin Dada, Therapist, sports.

## 2016-07-25 NOTE — Clinical Social Work Placement (Signed)
   CLINICAL SOCIAL WORK PLACEMENT  NOTE  Date:  07/25/2016  Patient Details  Name: Vincent Dunlap MRN: [id number] Date of Birth: August 01, 1941  Clinical Social Work is seeking post-discharge placement for this patient at the [place] level of care (*CSW will initial, date and re-position this form in  chart as items are completed):      Patient/family provided with [facility] Work Department's list of facilities offering this level of care within the geographic area requested by the patient (or if unable, by the patient's family).      Patient/family informed of their freedom to choose among providers that offer the needed level of care, that participate in Medicare, Medicaid or managed care program needed by the patient, have an available bed and are willing to accept the patient.      Patient/family informed of [facility]'s ownership interest in [facility] and [facility], as well as of the fact that they are under no obligation to receive care at these facilities.  PASRR submitted to EDS on 07/22/16     PASRR number received on 07/22/16     Existing PASRR number confirmed on       FL2 transmitted to all facilities in geographic area requested by pt/family on 07/22/16     FL2 transmitted to all facilities within larger geographic area on       Patient informed that his/her managed care company has contracts with or will negotiate with certain facilities, including the following:        Yes   Patient/family informed of bed offers received.  Patient chooses bed at [facility]     Physician recommends and patient chooses bed at      Patient to be transferred to [facility] on 07/25/16.  Patient to be transferred to facility by Ambulance [name])     Patient family notified on 07/25/16 of transfer.  Name of family member notified:  No family.  Patient notified and is reluctant but agreeable.       PHYSICIAN Please sign  FL2     Additional Comment: Patient d/c'd today to SNF.  Notified by nursing that patient was admitted with bedbugs. They have bagged up his belongings and there have been no sightings of bedbugs now on him.  Facility notified and aware.  Patient lives alone and does not have any family.  He is aware of bedbug situation- states he rents a home and does not feel his landlady would be willing to exterminate. Discussed concerns about his belongings- he cannot take anything with him to the SNF.  He is upset about this but understands. Patient states he has a friend who takes him to get groceries etc but he would not go buy him new clothes etc.  [facility]'s  SW will need to be involved with assisting patient when stable to return home.  Nursing notified to call report to facility. DC summary sent via epic HUB and no further SW needs identified. CSW signing off.  Lorie Phenix. Pauline Good, [place]     _______________________________________________ Williemae Area, LCSW 07/25/2016, 4:10 PM

## 2016-07-25 NOTE — Clinical Social Work Note (Signed)
CSW spoke with unit RN and also discussed with CSW Director re: patient's belongings that he wore into the hospital.  Due to the bedbugs- his belongings were bagged up and they cannot be taken to the SNF. RN discussed with patient who adamantly does not have anyone who could come and pick up his belongings and they cannot be kept at the nurses station due to the bed bugs enclosed.  RN spoke with Security but they were unable to accept the bag of belonging either.  Patient is aware that if he cannot locate anyone to come get his clothes then they will have to be disposed of.   CSW discussed with admissions at [facility]- she stated that they have donated clothes as the facility and will see to it that patient gets some clothes to wear.     Lorie Phenix. Pauline Good, [place]  (weekend coverage)

## 2016-07-25 NOTE — Progress Notes (Signed)
Report called to Johna Sheriff RN at [facility].

## 2016-07-25 NOTE — Discharge Summary (Addendum)
Physician Discharge Summary  Vincent Dunlap [id number] DOB: [birth year] DOA: 07/17/2016  PCP: Thressa Sheller, MD  Admit date: 07/17/2016 Discharge date: 07/25/2016  Admitted From: Home  Disposition:  SNF  Recommendations for Outpatient Follow-up:  1. Follow up with PCP in 1-2 weeks 2. Please obtain BMP/CBC in one week 3. Follow up with cardiology for further management of CAD>    Discharge Condition:Stable.  CODE STATUS: Full code.  Diet recommendation: Heart Healthy   Brief/Interim Summary: 75 y.o.malePMHx HTN and Tobacco Abuse,Chronic Pain (on oxycontine), Gout   Patient presents to the emergency department with a two-week history of progressive dyspnea, worse with even minimal exertion. EMS found patient to be hypoxic, placed on CPAP. Denies orthopnea. No cough. No known fevers. No history of pneumonia nor heart failure. Patient is compliant with blood pressure medications at home. He doesn't add salt to food but does consume a lot of canned foods. He reports recent onset of lower extremity edema. He is dizzy with walking and standing.     Discharge Diagnoses:  Principal Problem:   Acute respiratory failure with hypoxia (HCC) Active Problems:   Hypertension   Dizziness   CAD (coronary artery disease), native coronary artery   NSTEMI (non-ST elevated myocardial infarction) (HCC)   Pressure ulcer   Acute systolic CHF (congestive heart failure) (HCC)   Chronic pain syndrome   Exposure to potentially harmful entity   Acute coronary syndrome (HCC)   COPD exacerbation (HCC)   Chronic idiopathic thrombocytopenia (HCC)   Acute hypoxicrespiratory failure due to Acute exacerbation of Systolic CHF -EF 85-27% via TTE this admit  - no ACEi/ARB due to hypotension -Strict in and out since admission -8 L -Daily weigh      [facility] Weights   07/22/16 0500 07/23/16 0612 07/24/16 0556  Weight: 70.4 kg (155 lb 3.2 oz) 71 kg (156 lb 9.6 oz) 71.4 kg (157 lb 4.8 oz)  -9/11  right/left heart cath: Multivessel disease requires CABG see results below -Per cardiothoracic surgery note patient is not a good candidate for CABG. Medical management only -lisinopril, spironolactone.   Non-STEMI -Unable to tolerate IV nitro due to hypotension (remains relatively hypotensive)  -Coreg 6.25 mg BID -Lisinopril 2.5 mg daily tolerating low dose imdur.   Chronic pain syndrome -on chronic oxycodone - holding at present due to hypotension  - no evidence of uncontrolled pain. Has PRN tramadol   Hypotension -care with blood pressure medications and diuresis  -stable.   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    Discharge Instructions  Discharge Instructions    Diet - low sodium heart healthy    Complete by:  As directed    Increase activity slowly    Complete by:  As directed        Medication List    STOP taking these medications   amLODipine-benazepril 5-20 MG capsule Commonly known as:  LOTREL   oxyCODONE-acetaminophen 7.5-325 MG tablet Commonly known as:  PERCOCET   OXYCONTIN 20 mg 12 hr tablet Generic drug:  oxyCODONE     TAKE these medications   amitriptyline 25 MG tablet Commonly known as:  ELAVIL Take 25 mg by mouth at bedtime.   aspirin 81 MG chewable tablet Chew 1 tablet (81 mg total) by mouth  daily. Start taking on:  07/26/2016   atorvastatin 80 MG tablet Commonly known as:  LIPITOR Take 1 tablet (80 mg total) by mouth daily at 6 PM.   carvedilol 6.25 MG tablet Commonly known as:  COREG Take 1 tablet (6.25 mg total) by mouth 2 (two) times daily with a meal.   esomeprazole 20 MG capsule Commonly known as:  NEXIUM Take 20 mg by mouth daily as needed.   isosorbide mononitrate 30 MG 24 hr  tablet Commonly known as:  IMDUR Take 1 tablet (30 mg total) by mouth daily.   lisinopril 2.5 MG tablet Commonly known as:  PRINIVIL,ZESTRIL Take 1 tablet (2.5 mg total) by mouth daily. Start taking on:  07/26/2016   multivitamin with minerals Tabs tablet Take 1 tablet by mouth daily. Start taking on:  07/26/2016   nitroGLYCERIN 0.4 MG SL tablet Commonly known as:  NITROSTAT Place 1 tablet (0.4 mg total) under the tongue every 5 (five) minutes as needed for chest pain.   spironolactone 25 MG tablet Commonly known as:  ALDACTONE Take 0.5 tablets (12.5 mg total) by mouth daily. Start taking on:  07/26/2016   traMADol 50 MG tablet Commonly known as:  ULTRAM Take 1 tablet (50 mg total) by mouth every 6 (six) hours as needed for moderate pain or severe pain.       Allergies  Allergen Reactions  . Chantix [Varenicline] Other (See Comments)    Pt states it made him crazy    Consultations:  Cardiology    Procedures/Studies: Dg Chest Port 1 View  Result Date: 07/18/2016 CLINICAL DATA:  Dyspnea EXAM: PORTABLE CHEST 1 VIEW COMPARISON:  07/17/2016 FINDINGS: 0523 hours. Cardiopericardial silhouette is at upper limits of normal for size. Pulmonary vascular congestion with bilateral interstitial and basilar airspace disease suggests edema, minimally improved in the interval. Bones are diffusely demineralized. Telemetry leads overlie the chest. IMPRESSION: Interval improvement in lung aeration suggesting improving edema. Electronically Signed   By: Misty Stanley M.D.   On: 07/18/2016 07:22   Dg Chest Port 1 View  Result Date: 07/17/2016 CLINICAL DATA:  Shortness of breath for 2 weeks, worsening. Former smoker. EXAM: PORTABLE CHEST 1 VIEW COMPARISON:  08/19/2011 FINDINGS: Semi-erect portable AP view chest. There is linear scarring or atelectasis in the right upper lobe. Diffuse interstitial and alveolar opacities, right greater than left are present. There is thickening along the right  minor fissure, similar compared to prior study. Probable small calcified granuloma in the right peripheral lower lung zone. No effusion. Cardiomediastinal silhouette nonenlarged. There is atherosclerosis of the aorta. There is no pneumothorax. IMPRESSION: Mild interstitial and alveolar right greater than left pulmonary opacities could be secondary to respiratory infection versus edema. No significant pleural effusion. Atherosclerotic vascular disease of the aorta Electronically Signed   By: Donavan Foil M.D.   On: 07/17/2016 08:31       Subjective: Feeling better, agree with going to SNF  Discharge Exam: Vitals:   07/24/16 2111 07/25/16 0504  BP: 97/66 110/81  Pulse: 73 66  Resp: 13 16  Temp: 98.4 F (36.9 C) 97.8 F (36.6 C)   Vitals:   07/24/16 0556 07/24/16 1357 07/24/16 2111 07/25/16 0504  BP:  (!) 104/54 97/66 110/81  Pulse:  73 73 66  Resp:  20 13 16   Temp:  97.7 F (36.5 C) 98.4 F (36.9 C) 97.8 F (36.6 C)  TempSrc:  Oral Oral Oral  SpO2:  100% 98% 98%  Weight: 71.4 kg (157 lb 4.8  oz)   70 kg (154 lb 5.2 oz)  Height:        General: Pt is alert, awake, not in acute distress Cardiovascular: RRR, S1/S2 +, no rubs, no gallops Respiratory: CTA bilaterally, no wheezing, no rhonchi Abdominal: Soft, NT, ND, bowel sounds + Extremities: no edema, no cyanosis    The results of significant diagnostics from this hospitalization (including imaging, microbiology, ancillary and laboratory) are listed below for reference.     Microbiology: Recent Results (from the past 240 hour(s))  Culture, blood (Routine X 2) w Reflex to ID Panel     Status: None   Collection Time: 07/17/16  8:30 AM  Result Value Ref Range Status   Specimen Description BLOOD RIGHT ANTECUBITAL  Final   Special Requests BOTTLES DRAWN AEROBIC AND ANAEROBIC 5CC  Final   Culture NO GROWTH 5 DAYS  Final   Report Status 07/22/2016 FINAL  Final  Culture, blood (Routine X 2) w Reflex to ID Panel     Status:  None   Collection Time: 07/17/16  8:40 AM  Result Value Ref Range Status   Specimen Description BLOOD LEFT HAND  Final   Special Requests BOTTLES DRAWN AEROBIC AND ANAEROBIC 5CC  Final   Culture NO GROWTH 5 DAYS  Final   Report Status 07/22/2016 FINAL  Final  MRSA PCR Screening     Status: None   Collection Time: 07/17/16 12:34 PM  Result Value Ref Range Status   MRSA by PCR NEGATIVE NEGATIVE Final    Comment:        The GeneXpert MRSA Assay (FDA approved for NASAL specimens only), is one component of a comprehensive MRSA colonization surveillance program. It is not intended to diagnose MRSA infection nor to guide or monitor treatment for MRSA infections.      Labs: BNP (last 3 results)  Recent Labs  07/17/16 0705  BNP 3,716.9*   Basic Metabolic Panel:  Recent Labs Lab 07/19/16 0302 07/20/16 0556 07/22/16 0318 07/23/16 0225 07/24/16 0425 07/25/16 0408  NA 130* 130* 131* 131* 129* 128*  K 3.8 4.1 3.9 4.0 4.0 4.3  CL 94* 98* 100* 99* 101 99*  CO2 28 25 25 24 22 22   GLUCOSE 101* 100* 104* 102* 104* 96  BUN 16 9 5* 9 8 10   CREATININE 0.66 0.60* 0.62 0.67 0.52* 0.63  CALCIUM 8.5* 8.3* 8.7* 8.8* 8.8* 8.7*  MG 1.4* 1.8  --   --   --   --    Liver Function Tests:  Recent Labs Lab 07/19/16 0302 07/20/16 0556  AST 27 22  ALT 15* 15*  ALKPHOS 65 58  BILITOT 0.6 0.5  PROT 5.5* 5.1*  ALBUMIN 3.2* 2.8*   No results for input(s): LIPASE, AMYLASE in the last 168 hours. No results for input(s): AMMONIA in the last 168 hours. CBC:  Recent Labs Lab 07/19/16 0302 07/20/16 0556 07/21/16 0222 07/22/16 0318  WBC 13.0* 10.5 11.3* 10.8*  NEUTROABS 7.4  --   --   --   HGB 10.9* 10.0* 10.0* 10.0*  HCT 33.7* 30.7* 29.8* 31.2*  MCV 90.6 90.3 89.2 90.4  PLT 148* 121* 116* 115*   Cardiac Enzymes: No results for input(s): CKTOTAL, CKMB, CKMBINDEX, TROPONINI in the last 168 hours. BNP: Invalid input(s): POCBNP CBG:  Recent Labs Lab 07/19/16 2028 07/24/16 1205   GLUCAP 143* 106*   D-Dimer No results for input(s): DDIMER in the last 72 hours. Hgb A1c No results for input(s): HGBA1C in the last 72  hours. Lipid Profile No results for input(s): CHOL, HDL, LDLCALC, TRIG, CHOLHDL, LDLDIRECT in the last 72 hours. Thyroid function studies No results for input(s): TSH, T4TOTAL, T3FREE, THYROIDAB in the last 72 hours.  Invalid input(s): FREET3 Anemia work up No results for input(s): VITAMINB12, FOLATE, FERRITIN, TIBC, IRON, RETICCTPCT in the last 72 hours. Urinalysis    Component Value Date/Time   COLORURINE YELLOW 07/17/2016 1107   APPEARANCEUR CLEAR 07/17/2016 1107   LABSPEC 1.016 07/17/2016 1107   PHURINE 5.5 07/17/2016 1107   GLUCOSEU NEGATIVE 07/17/2016 1107   HGBUR NEGATIVE 07/17/2016 1107   BILIRUBINUR NEGATIVE 07/17/2016 1107   KETONESUR 15 (A) 07/17/2016 1107   PROTEINUR NEGATIVE 07/17/2016 1107   UROBILINOGEN 1.0 07/29/2009 0002   NITRITE NEGATIVE 07/17/2016 1107   LEUKOCYTESUR NEGATIVE 07/17/2016 1107   Sepsis Labs Invalid input(s): PROCALCITONIN,  WBC,  LACTICIDVEN Microbiology Recent Results (from the past 240 hour(s))  Culture, blood (Routine X 2) w Reflex to ID Panel     Status: None   Collection Time: 07/17/16  8:30 AM  Result Value Ref Range Status   Specimen Description BLOOD RIGHT ANTECUBITAL  Final   Special Requests BOTTLES DRAWN AEROBIC AND ANAEROBIC 5CC  Final   Culture NO GROWTH 5 DAYS  Final   Report Status 07/22/2016 FINAL  Final  Culture, blood (Routine X 2) w Reflex to ID Panel     Status: None   Collection Time: 07/17/16  8:40 AM  Result Value Ref Range Status   Specimen Description BLOOD LEFT HAND  Final   Special Requests BOTTLES DRAWN AEROBIC AND ANAEROBIC 5CC  Final   Culture NO GROWTH 5 DAYS  Final   Report Status 07/22/2016 FINAL  Final  MRSA PCR Screening     Status: None   Collection Time: 07/17/16 12:34 PM  Result Value Ref Range Status   MRSA by PCR NEGATIVE NEGATIVE Final    Comment:         The GeneXpert MRSA Assay (FDA approved for NASAL specimens only), is one component of a comprehensive MRSA colonization surveillance program. It is not intended to diagnose MRSA infection nor to guide or monitor treatment for MRSA infections.      Time coordinating discharge: Over 30 minutes  SIGNED:   Elmarie Shiley, MD  [facility] 07/25/2016, 10:32 AM Pager [phone]  If 7PM-7AM, please contact night-coverage [email or web address] Password [email or web address]

## 2016-07-26 NOTE — Care Management Note (Signed)
Case Management Note Marvetta Gibbons RN, BSN Unit 2W-Case Manager [phone]  Patient Details  Name: Vincent Dunlap MRN: [id number] Date of Birth: Aug 23, 1941  Subjective/Objective:   Pt admitted with acute resp. Failure/ HF- tx from [place] to 2W on 07/22/16- CSW following for SNF placement                 Action/Plan: PTA pt lived at home- anticipate d/c to SNF- CSW following for placement needs.   Expected Discharge Date:    07/25/16              Expected Discharge Plan:  Skilled Nursing Facility  In-House Referral:  Clinical Social Work  Discharge planning Services  CM Consult  Post Acute Care Choice:    Choice offered to:     DME Arranged:    DME Agency:     HH Arranged:  Nurse's Aide [place] Agency:     Status of Service:  Completed, signed off  If discussed at [employer], dates discussed:    Additional Comments:  Dawayne Patricia, RN 07/26/2016, 10:41 AM

## 2016-07-27 ENCOUNTER — Non-Acute Institutional Stay (SKILLED_NURSING_FACILITY): Payer: Medicare Other | Admitting: Internal Medicine

## 2016-07-27 ENCOUNTER — Encounter: Payer: Self-pay | Admitting: Internal Medicine

## 2016-07-27 ENCOUNTER — Other Ambulatory Visit: Payer: Self-pay | Admitting: Internal Medicine

## 2016-07-27 DIAGNOSIS — R42 Dizziness and giddiness: Secondary | ICD-10-CM | POA: Diagnosis not present

## 2016-07-27 DIAGNOSIS — D693 Immune thrombocytopenic purpura: Secondary | ICD-10-CM | POA: Diagnosis not present

## 2016-07-27 DIAGNOSIS — I10 Essential (primary) hypertension: Secondary | ICD-10-CM

## 2016-07-27 DIAGNOSIS — I25110 Atherosclerotic heart disease of native coronary artery with unstable angina pectoris: Secondary | ICD-10-CM

## 2016-07-27 DIAGNOSIS — G894 Chronic pain syndrome: Secondary | ICD-10-CM | POA: Diagnosis not present

## 2016-07-27 DIAGNOSIS — J9601 Acute respiratory failure with hypoxia: Secondary | ICD-10-CM | POA: Diagnosis not present

## 2016-07-27 NOTE — Assessment & Plan Note (Signed)
Because of low platelet count , avoid excess aspirin , ibuprofen, naproxen etc .Repeat platelet count if  any abnormal bruising or bleeding occur.

## 2016-07-27 NOTE — Assessment & Plan Note (Signed)
BP controlled; no change in antihypertensive medications  

## 2016-07-27 NOTE — Progress Notes (Signed)
Facility Location: [facility]  Room Number: [id number]  Code Status: Full Code   PCP: Thressa Sheller, [address] [place], [place] [address]   This is a comprehensive admission note to [facility] performed on this date less than 30 days from date of admission. Included are preadmission medical/surgical history;reconciled medication list; family history; social history and comprehensive review of systems.  Corrections and additions to the records were documented . Comprehensive physical exam was also performed. Additionally a clinical summary was entered for each active diagnosis pertinent to this admission in the Problem List to enhance continuity of care.   HPI: The patient was hospitalized 9/9-9/17/17 with acute hypoxic respiratory failure due to exacerbation acutely of systolic congestive heart failure. He presented with a two-week history of progressive dyspnea worse with even minimal exertion. EMS found him to be hypoxic and initiated CPAP. He had had recent edema in the context of ingesting canned foods. He was found to have an ejection reaction of 30-35 percent as per TTE. Initially  ACE/ARBr were not initiated due to hypotension. With aggressive therapy he was noted to lose a total of 8 L in hospital.   He does have coronary artery disease and has had a history of NSTEMI. He states he was unaware of this. Cardiology did not feel that he was a good candidate for bypass grafting. Medical management with low-dose carvedilol and low dose lisinopril and spironolactone was initiated. Oxycodone prescribed for chronic pain was held because of hypotension. Hypomagnesemia was corrected  Past medical and surgical history:History includes hypertension, tobacco abuse, idiopathic thrombocytopenia, and chronic pain syndrome.   Social history: Reviewed and updated, see entries  Family history: Reviewed and updated, see entries  Review of  systems: His major symptom is dizziness which he's had since 2014 and which is progressive. It is improved when supine but recurs when he sits up or stands. He describes some vertiginous component. There is no other cardiac or neurologic prodrome. He describes chronic depression for which he takes amitriptyline with some response. Constitutional: No fever,significant weight change, fatigue  Eyes: No redness, discharge, pain, vision change ENT/mouth: No nasal congestion,  purulent discharge, earache,change in hearing ,sore throat  Cardiovascular: No chest pain, palpitations,paroxysmal nocturnal dyspnea, claudication, edema  Respiratory: No cough, sputum production,hemoptysis, DOE , significant snoring,apnea   Gastrointestinal: No heartburn,dysphagia,abdominal pain, nausea / vomiting,rectal bleeding, melena,change in bowels Genitourinary: No dysuria,hematuria, pyuria,  incontinence, nocturia Musculoskeletal: No joint stiffness, joint swelling, weakness,pain Dermatologic: No rash, pruritus, change in appearance of skin Neurologic: No syncope, seizures, numbness , tingling rate he has occasional occipital headaches. Psychiatric: No significant  insomnia, anorexia Endocrine: No change in hair/skin/ nails, excessive thirst, excessive hunger, excessive urination  Hematologic/lymphatic: No significant bruising, lymphadenopathy,abnormal bleeding Allergy/immunology: No itchy/ watery eyes, significant sneezing, urticaria, angioedema  Physical exam:  Pertinent or positive findings: pattern alopecia is present. He has a beard and mustache  He has an upper plate ; lower teeth reveal plaque and poor hygiene. He has nonprofessional tattoos over the forearms. He has isolated flexion contractures of the hands. He has severe deformities of the third right PIP joint. He was slightly unsteady when he stood ,but there is no positive Romberg  General appearance:Adequately nourished; no acute distress , increased work  of breathing is present.   Lymphatic: No lymphadenopathy about the head, neck, axilla . Eyes: No conjunctival inflammation or lid edema is present. There is no scleral icterus. Extraocular motion intact. No nystagmus seen. Ears:  External ear exam shows no significant lesions or deformities.   Nose:  External nasal examination shows no deformity or inflammation. Nasal mucosa are pink and moist without lesions ,exudates Oral exam: lips and gums are healthy appearing.There is no oropharyngeal erythema or exudate . Neck:  No thyromegaly, masses, tenderness noted.    Heart:  Normal rate and regular rhythm. S1 and S2 normal without gallop, murmur, click, rub .  Lungs:Chest clear to auscultation without wheezes, rhonchi,rales , rubs. The breath sounds are decreased. Abdomen:Bowel sounds are normal. Abdomen is soft and nontender with no organomegaly, hernias,masses. GU: deferred . Extremities:  No cyanosis, clubbing,edema  Neurologic exam : Strength equal  in upper & lower extremities Deep tendon reflexes are equal but 0-1/2+  Skin: Warm & dry w/o tenting. No significant lesions or rash.  See clinical summary under each active problem in the Problem List with associated updated therapeutic plan

## 2016-07-27 NOTE — Assessment & Plan Note (Signed)
Oxycodone was discontinued in the hospital due to hypotension He has tramadol ordered every 6 hours as needed If chronic pain progresses, referral to chronic pain clinic

## 2016-07-27 NOTE — Assessment & Plan Note (Signed)
DuoNeb treatments every 4-6 hours as needed Maintain O2 sats greater than 90% to prevent ischemia and cardiac decompensation

## 2016-07-27 NOTE — Patient Instructions (Signed)
To perform isometric exercise of calves  ( while seated go up on toes to count of 5 & then onto heels for 5 count). Repeat  4- 5 times prior to standing if he's been seated or supine for any significant period of time TED hose trial

## 2016-07-27 NOTE — Assessment & Plan Note (Signed)
Perform isometric exercise of calves  ( while seated go up on toes to count of 5 & then onto heels for 5 count). Repeat  4- 5 times prior to standing if you've been seated or supine for any significant period of time as BP drops with such positions.  TED hose Neurology consultation if no better

## 2016-07-27 NOTE — Assessment & Plan Note (Signed)
Assess for potential improvement following tobacco cessation

## 2016-07-27 NOTE — Assessment & Plan Note (Signed)
Perform isometric exercise of calves  ( while seated go up on toes to count of 5 & then onto heels for 5 count). Repeat  4- 5 times prior to standing if you've been seated or supine for any significant period of time as BP drops with such positions.  Trial of TED hose Neurology consultation if no better

## 2016-07-29 DIAGNOSIS — L89613 Pressure ulcer of right heel, stage 3: Secondary | ICD-10-CM | POA: Diagnosis not present

## 2016-10-28 DIAGNOSIS — I129 Hypertensive chronic kidney disease with stage 1 through stage 4 chronic kidney disease, or unspecified chronic kidney disease: Secondary | ICD-10-CM | POA: Diagnosis not present

## 2016-10-28 DIAGNOSIS — M109 Gout, unspecified: Secondary | ICD-10-CM | POA: Diagnosis not present

## 2016-10-28 DIAGNOSIS — M8580 Other specified disorders of bone density and structure, unspecified site: Secondary | ICD-10-CM | POA: Diagnosis not present

## 2016-11-09 DIAGNOSIS — D693 Immune thrombocytopenic purpura: Secondary | ICD-10-CM | POA: Diagnosis not present

## 2016-11-09 DIAGNOSIS — N182 Chronic kidney disease, stage 2 (mild): Secondary | ICD-10-CM | POA: Diagnosis not present

## 2016-11-09 DIAGNOSIS — M109 Gout, unspecified: Secondary | ICD-10-CM | POA: Diagnosis not present

## 2016-11-09 DIAGNOSIS — I129 Hypertensive chronic kidney disease with stage 1 through stage 4 chronic kidney disease, or unspecified chronic kidney disease: Secondary | ICD-10-CM | POA: Diagnosis not present

## 2016-11-09 DIAGNOSIS — N390 Urinary tract infection, site not specified: Secondary | ICD-10-CM | POA: Diagnosis not present

## 2017-02-02 DIAGNOSIS — M47817 Spondylosis without myelopathy or radiculopathy, lumbosacral region: Secondary | ICD-10-CM | POA: Diagnosis not present

## 2017-02-02 DIAGNOSIS — M545 Low back pain: Secondary | ICD-10-CM | POA: Diagnosis not present

## 2017-02-09 DIAGNOSIS — M549 Dorsalgia, unspecified: Secondary | ICD-10-CM | POA: Diagnosis not present

## 2017-02-09 DIAGNOSIS — Z79891 Long term (current) use of opiate analgesic: Secondary | ICD-10-CM | POA: Diagnosis not present

## 2017-02-09 DIAGNOSIS — G894 Chronic pain syndrome: Secondary | ICD-10-CM | POA: Diagnosis not present

## 2017-02-09 DIAGNOSIS — Z79899 Other long term (current) drug therapy: Secondary | ICD-10-CM | POA: Diagnosis not present

## 2017-02-09 DIAGNOSIS — M47816 Spondylosis without myelopathy or radiculopathy, lumbar region: Secondary | ICD-10-CM | POA: Diagnosis not present

## 2017-02-09 DIAGNOSIS — M25519 Pain in unspecified shoulder: Secondary | ICD-10-CM | POA: Diagnosis not present

## 2017-04-15 DIAGNOSIS — Z125 Encounter for screening for malignant neoplasm of prostate: Secondary | ICD-10-CM | POA: Diagnosis not present

## 2017-04-15 DIAGNOSIS — R531 Weakness: Secondary | ICD-10-CM | POA: Diagnosis not present

## 2017-04-15 DIAGNOSIS — E785 Hyperlipidemia, unspecified: Secondary | ICD-10-CM | POA: Diagnosis not present

## 2017-04-15 DIAGNOSIS — I10 Essential (primary) hypertension: Secondary | ICD-10-CM | POA: Diagnosis not present

## 2017-04-15 DIAGNOSIS — Z0000 Encounter for general adult medical examination without abnormal findings: Secondary | ICD-10-CM | POA: Diagnosis not present

## 2017-04-15 DIAGNOSIS — I129 Hypertensive chronic kidney disease with stage 1 through stage 4 chronic kidney disease, or unspecified chronic kidney disease: Secondary | ICD-10-CM | POA: Diagnosis not present

## 2017-04-15 DIAGNOSIS — I2510 Atherosclerotic heart disease of native coronary artery without angina pectoris: Secondary | ICD-10-CM | POA: Diagnosis not present

## 2017-07-16 ENCOUNTER — Encounter (HOSPITAL_COMMUNITY): Payer: Self-pay | Admitting: Emergency Medicine

## 2017-07-16 ENCOUNTER — Emergency Department (HOSPITAL_COMMUNITY): Payer: Medicare Other

## 2017-07-16 ENCOUNTER — Inpatient Hospital Stay (HOSPITAL_COMMUNITY)
Admission: EM | Admit: 2017-07-16 | Discharge: 2017-07-30 | DRG: 854 | Disposition: A | Discharge status: Home Health | Payer: Medicare Other | Attending: Internal Medicine | Admitting: Internal Medicine

## 2017-07-16 DIAGNOSIS — L97514 Non-pressure chronic ulcer of other part of right foot with necrosis of bone: Secondary | ICD-10-CM | POA: Diagnosis not present

## 2017-07-16 DIAGNOSIS — I252 Old myocardial infarction: Secondary | ICD-10-CM | POA: Diagnosis not present

## 2017-07-16 DIAGNOSIS — I70261 Atherosclerosis of native arteries of extremities with gangrene, right leg: Secondary | ICD-10-CM | POA: Diagnosis not present

## 2017-07-16 DIAGNOSIS — K5900 Constipation, unspecified: Secondary | ICD-10-CM | POA: Diagnosis not present

## 2017-07-16 DIAGNOSIS — R52 Pain, unspecified: Secondary | ICD-10-CM

## 2017-07-16 DIAGNOSIS — M869 Osteomyelitis, unspecified: Secondary | ICD-10-CM | POA: Diagnosis present

## 2017-07-16 DIAGNOSIS — F329 Major depressive disorder, single episode, unspecified: Secondary | ICD-10-CM | POA: Diagnosis present

## 2017-07-16 DIAGNOSIS — I25118 Atherosclerotic heart disease of native coronary artery with other forms of angina pectoris: Secondary | ICD-10-CM | POA: Diagnosis present

## 2017-07-16 DIAGNOSIS — E43 Unspecified severe protein-calorie malnutrition: Secondary | ICD-10-CM

## 2017-07-16 DIAGNOSIS — N3000 Acute cystitis without hematuria: Secondary | ICD-10-CM

## 2017-07-16 DIAGNOSIS — G894 Chronic pain syndrome: Secondary | ICD-10-CM | POA: Diagnosis not present

## 2017-07-16 DIAGNOSIS — A419 Sepsis, unspecified organism: Principal | ICD-10-CM | POA: Diagnosis present

## 2017-07-16 DIAGNOSIS — I255 Ischemic cardiomyopathy: Secondary | ICD-10-CM | POA: Diagnosis present

## 2017-07-16 DIAGNOSIS — K219 Gastro-esophageal reflux disease without esophagitis: Secondary | ICD-10-CM | POA: Diagnosis present

## 2017-07-16 DIAGNOSIS — J449 Chronic obstructive pulmonary disease, unspecified: Secondary | ICD-10-CM | POA: Diagnosis present

## 2017-07-16 DIAGNOSIS — L89152 Pressure ulcer of sacral region, stage 2: Secondary | ICD-10-CM | POA: Diagnosis present

## 2017-07-16 DIAGNOSIS — Z888 Allergy status to other drugs, medicaments and biological substances status: Secondary | ICD-10-CM

## 2017-07-16 DIAGNOSIS — E785 Hyperlipidemia, unspecified: Secondary | ICD-10-CM | POA: Diagnosis present

## 2017-07-16 DIAGNOSIS — R079 Chest pain, unspecified: Secondary | ICD-10-CM | POA: Diagnosis present

## 2017-07-16 DIAGNOSIS — I25110 Atherosclerotic heart disease of native coronary artery with unstable angina pectoris: Secondary | ICD-10-CM | POA: Diagnosis not present

## 2017-07-16 DIAGNOSIS — I959 Hypotension, unspecified: Secondary | ICD-10-CM

## 2017-07-16 DIAGNOSIS — I10 Essential (primary) hypertension: Secondary | ICD-10-CM | POA: Diagnosis not present

## 2017-07-16 DIAGNOSIS — I9589 Other hypotension: Secondary | ICD-10-CM | POA: Diagnosis not present

## 2017-07-16 DIAGNOSIS — M79671 Pain in right foot: Secondary | ICD-10-CM | POA: Diagnosis not present

## 2017-07-16 DIAGNOSIS — I110 Hypertensive heart disease with heart failure: Secondary | ICD-10-CM | POA: Diagnosis not present

## 2017-07-16 DIAGNOSIS — D693 Immune thrombocytopenic purpura: Secondary | ICD-10-CM | POA: Diagnosis present

## 2017-07-16 DIAGNOSIS — M79673 Pain in unspecified foot: Secondary | ICD-10-CM | POA: Diagnosis not present

## 2017-07-16 DIAGNOSIS — I2510 Atherosclerotic heart disease of native coronary artery without angina pectoris: Secondary | ICD-10-CM | POA: Diagnosis present

## 2017-07-16 DIAGNOSIS — Z87891 Personal history of nicotine dependence: Secondary | ICD-10-CM

## 2017-07-16 DIAGNOSIS — I5022 Chronic systolic (congestive) heart failure: Secondary | ICD-10-CM | POA: Diagnosis not present

## 2017-07-16 DIAGNOSIS — Z79899 Other long term (current) drug therapy: Secondary | ICD-10-CM

## 2017-07-16 DIAGNOSIS — J8410 Pulmonary fibrosis, unspecified: Secondary | ICD-10-CM | POA: Diagnosis not present

## 2017-07-16 DIAGNOSIS — E860 Dehydration: Secondary | ICD-10-CM | POA: Diagnosis not present

## 2017-07-16 DIAGNOSIS — I340 Nonrheumatic mitral (valve) insufficiency: Secondary | ICD-10-CM | POA: Diagnosis not present

## 2017-07-16 DIAGNOSIS — M868X7 Other osteomyelitis, ankle and foot: Secondary | ICD-10-CM | POA: Diagnosis not present

## 2017-07-16 DIAGNOSIS — I351 Nonrheumatic aortic (valve) insufficiency: Secondary | ICD-10-CM | POA: Diagnosis not present

## 2017-07-16 DIAGNOSIS — Z7982 Long term (current) use of aspirin: Secondary | ICD-10-CM | POA: Diagnosis not present

## 2017-07-16 DIAGNOSIS — I739 Peripheral vascular disease, unspecified: Secondary | ICD-10-CM | POA: Diagnosis not present

## 2017-07-16 DIAGNOSIS — E871 Hypo-osmolality and hyponatremia: Secondary | ICD-10-CM | POA: Diagnosis not present

## 2017-07-16 DIAGNOSIS — N390 Urinary tract infection, site not specified: Secondary | ICD-10-CM | POA: Diagnosis not present

## 2017-07-16 DIAGNOSIS — L8990 Pressure ulcer of unspecified site, unspecified stage: Secondary | ICD-10-CM | POA: Insufficient documentation

## 2017-07-16 DIAGNOSIS — R9431 Abnormal electrocardiogram [ECG] [EKG]: Secondary | ICD-10-CM | POA: Diagnosis not present

## 2017-07-16 HISTORY — DX: Atherosclerotic heart disease of native coronary artery without angina pectoris: I25.10

## 2017-07-16 HISTORY — DX: Ischemic cardiomyopathy: I25.5

## 2017-07-16 LAB — BASIC METABOLIC PANEL
Anion gap: 14 (ref 5–15)
BUN: 22 mg/dL — ABNORMAL HIGH (ref 6–20)
CALCIUM: 9.1 mg/dL (ref 8.9–10.3)
CO2: 18 mmol/L — ABNORMAL LOW (ref 22–32)
CREATININE: 0.71 mg/dL (ref 0.61–1.24)
Chloride: 98 mmol/L — ABNORMAL LOW (ref 101–111)
GFR calc Af Amer: >60 mL/min (ref >60)
Glucose, Bld: 100 mg/dL — ABNORMAL HIGH (ref 65–99)
POTASSIUM: 3.7 mmol/L (ref 3.5–5.1)
SODIUM: 130 mmol/L — AB (ref 135–145)

## 2017-07-16 LAB — URINALYSIS, ROUTINE W REFLEX MICROSCOPIC
BILIRUBIN URINE: NEGATIVE
Glucose, UA: NEGATIVE mg/dL
Ketones, ur: 5 mg/dL — AB
Nitrite: NEGATIVE
PH: 5 (ref 5.0–8.0)
Protein, ur: 30 mg/dL — AB
SPECIFIC GRAVITY, URINE: 1.015 (ref 1.005–1.030)
SQUAMOUS EPITHELIAL / LPF: NONE SEEN

## 2017-07-16 LAB — CBC
HCT: 32.2 % — ABNORMAL LOW (ref 39.0–52.0)
Hemoglobin: 10.6 g/dL — ABNORMAL LOW (ref 13.0–17.0)
MCH: 25.3 pg — AB (ref 26.0–34.0)
MCHC: 32.9 g/dL (ref 30.0–36.0)
MCV: 76.8 fL — AB (ref 78.0–100.0)
PLATELETS: 100 10*3/uL — AB (ref 150–400)
RBC: 4.19 MIL/uL — ABNORMAL LOW (ref 4.22–5.81)
RDW: 17.6 % — AB (ref 11.5–15.5)
WBC: 14.6 10*3/uL — ABNORMAL HIGH (ref 4.0–10.5)

## 2017-07-16 MED ORDER — PIPERACILLIN-TAZOBACTAM 3.375 G IVPB 30 MIN
3.3750 g | Freq: Once | INTRAVENOUS | Status: AC
  Administered 2017-07-17: 3.375 g via INTRAVENOUS
  Filled 2017-07-16: qty 50

## 2017-07-16 MED ORDER — VANCOMYCIN HCL IN DEXTROSE 1-5 GM/200ML-% IV SOLN
1000.0000 mg | Freq: Once | INTRAVENOUS | Status: AC
  Administered 2017-07-17: 1000 mg via INTRAVENOUS
  Filled 2017-07-16: qty 200

## 2017-07-16 NOTE — ED Triage Notes (Signed)
Brought by ems for c/o right foot pain due to necrotic second toe that has been going on for 10 months per patient.  Family reported to ems that patient was hypotensive.  BP noted to be 79/46 in triage.  When prompted states I do feel dizzy when I try to get up.

## 2017-07-16 NOTE — H&P (Signed)
History and Physical    Vincent Dunlap YWV:[id number] DOB: Dec 12, 1940 DOA: 07/16/2017  Referring MD/NP/PA:   PCP: Thressa Sheller, MD   Patient coming from:  The patient is coming from home.  At baseline, pt is independent for most of ADL.  Chief Complaint: foot pain, chest pain, dysuria  HPI: Vincent Dunlap is a 76 y.o. male with medical history significant of hypertension, hyperlipidemia, COPD, GERD, depression, formal smoker, sCHF with EF of 30-35%, CAD (multivessel disease, but not a candidate for CABG), chronic thrombocytopenia, who presents with foot pain, chest pain, and dysuria  Pt states that that his right foot has been hurting for more than 10 months, which has been progressively getting worse. He has ulcer in the tip of right second toe. The ulcer is in black color and necrotic. Patient does not have fever or chills. He also reports intermittent chest pain for more than a year. He had another chest pain in this morning which lasted for about 45 minutes, then relieved by nitroglycerin. Currently no chest pain, SOB, cough. Patient states that he has dysuria, burning on urination recently. He has dizziness. Pt was initially hypotensive to 79/46 in triage. Receiving 1L NS bolus, SBP improved to above 100.   ED Course: pt was found to have WBC 14.6, lactic acid 0.8, creatinine normal, positive urinalysis with moderate amount of leukocytes, temperature normal, tachycardia, tachypnea, oxygen saturation 98% on room air. Chest x-ray showed interstitial fibrosis change without infiltration. X-ray of the left foot showed possible osteomyelitis in the left second toe. Patient is admitted to telemetry bed as inpatient.  Review of Systems:   General: no fevers, chills, no body weight gain, has fatigue HEENT: no blurry vision, hearing changes or sore throat Respiratory: no dyspnea, coughing, wheezing CV: has chest pain, no palpitations GI: no nausea, vomiting, abdominal pain, diarrhea,  constipation GU: no dysuria, burning on urination, increased urinary frequency, hematuria  Ext: no leg edema. Has foot pain and foot ulcer Neuro: no unilateral weakness, numbness, or tingling, no vision change or hearing loss Skin: no rash, no skin tear. MSK: No muscle spasm, no deformity, no limitation of range of movement in spin Heme: No easy bruising.  Travel history: No recent long distant travel.  Allergy:  Allergies  Allergen Reactions  . Chantix [Varenicline] Other (See Comments)    Pt states it made him crazy    Past Medical History:  Diagnosis Date  . Hypertension   . Tobacco abuse     Past Surgical History:  Procedure Laterality Date  . CARDIAC CATHETERIZATION N/A 07/19/2016   Procedure: Right/Left Heart Cath and Coronary Angiography;  Surgeon: Troy Sine, MD;  Location: [facility];  Service: Cardiovascular;  Laterality: N/A;  . INGUINAL HERNIA REPAIR Bilateral     Social History:  reports that he quit smoking about a year ago. He has quit using smokeless tobacco. He reports that he does not drink alcohol or use drugs.  Family History:  Family History  Problem Relation Age of Onset  . Alzheimer's disease Mother   . Diabetes Mother        Diabetes in the maternal side of the family but not in mother  . Heart disease Neg Hx   . Cancer Neg Hx      Prior to Admission medications   Medication Sig Start Date End Date Taking? Authorizing Provider  amitriptyline (ELAVIL) 25 MG tablet Take 25 mg by mouth at bedtime.    [provider]  aspirin 81 MG chewable tablet Chew 1 tablet (81 mg total) by mouth daily. 07/26/16   Regalado, Belkys A, MD  atorvastatin (LIPITOR) 80 MG tablet Take 1 tablet (80 mg total) by mouth daily at 6 PM. 07/25/16   Regalado, Belkys A, MD  carvedilol (COREG) 6.25 MG tablet Take 1 tablet (6.25 mg total) by mouth 2 (two) times daily with a meal. 07/25/16   Regalado, Belkys A, MD  esomeprazole (NEXIUM) 20 MG capsule Take 20 mg by  mouth daily as needed.    [provider]  isosorbide mononitrate (IMDUR) 30 MG 24 hr tablet Take 1 tablet (30 mg total) by mouth daily. 07/25/16   Regalado, Belkys A, MD  lisinopril (PRINIVIL,ZESTRIL) 2.5 MG tablet Take 1 tablet (2.5 mg total) by mouth daily. 07/26/16   Regalado, Belkys A, MD  Multiple Vitamin (MULTIVITAMIN WITH MINERALS) TABS tablet Take 1 tablet by mouth daily. 07/26/16   Regalado, Belkys A, MD  nitroGLYCERIN (NITROSTAT) 0.4 MG SL tablet Place 1 tablet (0.4 mg total) under the tongue every 5 (five) minutes as needed for chest pain. 07/25/16   Regalado, Belkys A, MD  spironolactone (ALDACTONE) 25 MG tablet Take 0.5 tablets (12.5 mg total) by mouth daily. 07/26/16   Regalado, Belkys A, MD  traMADol (ULTRAM) 50 MG tablet Take 1 tablet (50 mg total) by mouth every 6 (six) hours as needed for moderate pain or severe pain. 07/25/16   Regalado, Belkys A, MD  UNABLE TO FIND Med Name: Med Pass 120 mL twice daily    [provider]    Physical Exam: Vitals:   07/17/17 0115 07/17/17 0130 07/17/17 0215 07/17/17 0534  BP: (!) 127/94 114/69 (!) 123/92 118/72  Pulse: 92 89 92 90  Resp: 15 16 18 18   Temp:   97.8 F (36.6 C) 97.6 F (36.4 C)  TempSrc:   Oral Oral  SpO2: 95% 97% 99% 96%  Weight:   70.9 kg (156 lb 3.2 oz)   Height:   5' 11"  (1.803 m)    General: Not in acute distress HEENT:       Eyes: PERRL, EOMI, no scleral icterus.       ENT: No discharge from the ears and nose, no pharynx injection, no tonsillar enlargement.        Neck: No JVD, no bruit, no mass felt. Heme: No neck lymph node enlargement. Cardiac: S1/S2, RRR, No murmurs, No gallops or rubs. Respiratory: No rales, wheezing, rhonchi or rubs. GI: Soft, nondistended, nontender, no rebound pain, no organomegaly, BS present. GU: No hematuria Ext: No pitting leg edema bilaterally. Right foot is erythematous and tender. Second toe on right foot is with necrotic distal tip.  Musculoskeletal: No joint  deformities, No joint redness or warmth, no limitation of ROM in spin. Skin: No rashes.  Neuro: Alert, oriented X3, cranial nerves II-XII grossly intact, moves all extremities normally. Psych: Patient is not psychotic, no suicidal or hemocidal ideation.  Labs on Admission: I have personally reviewed following labs and imaging studies  CBC:  Recent Labs Lab 07/16/17 2114  WBC 14.6*  HGB 10.6*  HCT 32.2*  MCV 76.8*  PLT 542*   Basic Metabolic Panel:  Recent Labs Lab 07/16/17 2114  NA 130*  K 3.7  CL 98*  CO2 18*  GLUCOSE 100*  BUN 22*  CREATININE 0.71  CALCIUM 9.1   GFR: Estimated Creatinine Clearance: 80 mL/min (by C-G formula based on SCr of 0.71 mg/dL). Liver Function Tests: No results for input(s):  AST, ALT, ALKPHOS, BILITOT, PROT, ALBUMIN in the last 168 hours. No results for input(s): LIPASE, AMYLASE in the last 168 hours. No results for input(s): AMMONIA in the last 168 hours. Coagulation Profile:  Recent Labs Lab 07/17/17 0049  INR 1.12   Cardiac Enzymes:  Recent Labs Lab 07/17/17 0317  TROPONINI <0.03   BNP (last 3 results) No results for input(s): PROBNP in the last 8760 hours. HbA1C: No results for input(s): HGBA1C in the last 72 hours. CBG: No results for input(s): GLUCAP in the last 168 hours. Lipid Profile:  Recent Labs  07/17/17 0317  CHOL 81  HDL 32*  LDLCALC 38  TRIG 57  CHOLHDL 2.5   Thyroid Function Tests: No results for input(s): TSH, T4TOTAL, FREET4, T3FREE, THYROIDAB in the last 72 hours. Anemia Panel: No results for input(s): VITAMINB12, FOLATE, FERRITIN, TIBC, IRON, RETICCTPCT in the last 72 hours. Urine analysis:    Component Value Date/Time   COLORURINE YELLOW 07/16/2017 2330   APPEARANCEUR HAZY (A) 07/16/2017 2330   LABSPEC 1.015 07/16/2017 2330   PHURINE 5.0 07/16/2017 2330   GLUCOSEU NEGATIVE 07/16/2017 2330   HGBUR SMALL (A) 07/16/2017 2330   BILIRUBINUR NEGATIVE 07/16/2017 2330   KETONESUR 5 (A) 07/16/2017  2330   PROTEINUR 30 (A) 07/16/2017 2330   UROBILINOGEN 1.0 07/29/2009 0002   NITRITE NEGATIVE 07/16/2017 2330   LEUKOCYTESUR MODERATE (A) 07/16/2017 2330   Sepsis Labs: @LABRCNTIP (procalcitonin:4,lacticidven:4) )No results found for this or any previous visit (from the past 240 hour(s)).   Radiological Exams on Admission: Dg Chest 2 View  Result Date: 07/16/2017 CLINICAL DATA:  Dizziness and hypotension. History of hypertension. Former smoker. EXAM: CHEST  2 VIEW COMPARISON:  07/18/2016 FINDINGS: Shallow inspiration. Heart size and pulmonary vascularity are normal. Slight interstitial pattern to the lung bases likely representing fibrosis. Mild bronchiectasis. No airspace disease or consolidation. No blunting of costophrenic angles. No pneumothorax. Calcification of the aorta. Previous resection or resorption of the distal left clavicle. Old left rib fractures. 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 No evidence of active pulmonary disease. Aortic atherosclerosis. Electronically Signed   By: Lucienne Capers M.D.   On: 07/16/2017 23:41   Dg Foot 2 Views Right  Result Date: 07/16/2017 CLINICAL DATA:  Necrotic second toe for 10 months. Right foot pain. Dizziness and hypotension. History of hypertension. Former smoker. EXAM: RIGHT FOOT - 2 VIEW COMPARISON:  None. FINDINGS: Diffuse bone demineralization. Soft tissue defect at the tip of the right second toe with underlying irregularity of the distal phalangeal tuft likely representing osteomyelitis. No evidence of acute fracture or dislocation. Multiple exostoses demonstrated. Vascular calcifications. Soft tissues are otherwise unremarkable. IMPRESSION: Soft tissue defect at the tip of the right second toe with underlying bone erosion of the distal phalangeal tuft likely representing osteomyelitis. Diffuse bone demineralization. Multiple bony exostoses. Electronically Signed   By: Lucienne Capers M.D.   On: 07/16/2017 23:40     EKG:  Independently reviewed.  Sinus rhythm, QTC 471, AST depression in inferior leads and V4-V6.  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 -  will admit to tele bed as inpt - Empiric antimicrobial treatment with vancomycin and Zosyn per pharmacy - PRN Percocet for pain - Blood cultures x 2  - ESR and CRP - wound care consult - Please consult ortho in AM  UTI: -on vancomycin and Zosyn -Follow up urine culture  Sepsis due to UTI and osteomyelitis of second toe right foot: pt meets criteria for sepsis with leukocytosis, hypotension, tachycardia, tachypnea. Blood pressure responded to IV fluids. Currently hemodynamically stable. Lactic acid is normal. - will get Procalcitonin and trend lactic acid levels per sepsis protocol. - IVF: 1 L of NS bolus in ED, followed by 50 cc/h  Chest pain and hx of CAD: Chart review showed that pt had  right/left heart cath 07/19/16, showed multivessel disease requires CABG, but per cardiothoracic surgery, patientis not a good candidate for CABG.  - cycle CE q6 x3 and repeat EKG in the am  - prn Nitroglycerin,  Morphine and aspirin, lipitor, coreg - Risk factor stratification: will check FLP and A1C  - 2d echo - Inpatient non-urgent consult order was put in Epic and message to Birdie [employer] was sent out.  Chronic systolic CHF (congestive heart failure) ([place]): 2-D echo on 07/17/16 showed EF of 30-35%. Patient has no leg edema or JVD. No respiratory distress, CHF seems to be compensated. -Hold spironolactone due to  sepsis -Continue aspirin and Coreg -Check BNP  COPD (chronic obstructive pulmonary disease) ([place]): stable -prn albuterol nebs  Chronic idiopathic thrombocytopenia ([place]): platelet 100. No bleeding tendency. -f/u by CBC   DVT ppx: SCD Code Status: Full code Family Communication: None at bed side.       Disposition Plan:  Anticipate discharge back to previous home environment Consults called:  none Admission status:  Inpatient/tele     Date of Service 07/17/2017    Ivor Costa [facility] Pager [phone]  If 7PM-7AM, please contact night-coverage [email or web address] Password [facility] 07/17/2017, 6:31 AM

## 2017-07-16 NOTE — ED Provider Notes (Signed)
[facility] DEPT Provider Note   CSN: [id number] Arrival date & time: 07/16/17  2111  History   Chief Complaint Chief Complaint  Patient presents with  . Hypotension  . Toe Pain   HPI  Vincent Dunlap is a 76yo male with PMH significant for COPD, HFrEF (last EF 30-35%), CAD (with multivessel disease in 2017, not a candidate for CABG), HTN, and tobacco use who presents with worsening right foot pain. He states that his right foot has been hurting for the last 10 months, but has gotten worse recently. He has not been able to ambulate due to the pain. He denies fevers; endorses chills. Denies loss of sensation in lower extremities. Denies recent weight loss.  He also states he has had intermittent chest pain for the last year. It comes on at rest. He had an episode of chest pain this morning that was not associated with nausea or shortness of breath. Lasted ~76min. It was relieved by SL NTG tablet. Found to be hypotensive to 79/46 in triage. Receiving 1L NS bolus with some improvement in his dizziness. He also endorses dysuria for the last year.  Smokes ~3/4ppd, denies alcohol use, or other illicit drugs.   Past Medical History:  Diagnosis Date  . Hypertension   . Tobacco abuse     Patient Active Problem List   Diagnosis Date Noted  . Acute coronary syndrome ([place]) 07/22/2016  . COPD exacerbation ([place])   . 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 Acute systolic CHF (congestive heart failure) ([place])   . Chronic pain syndrome   . Exposure to potentially harmful entity   . Pressure ulcer 07/18/2016  . Acute respiratory failure with hypoxia ([place]) 07/17/2016  . Hypertension 07/17/2016  . Dizziness 07/17/2016  . Congestive heart failure ([place])   . CAD (coronary artery disease), native coronary artery   . NSTEMI (non-ST elevated myocardial infarction) ([place])   . At risk for falling 05/20/2014  . Thrombocytopenia ([place]) 08/30/2012    Past Surgical History:  Procedure Laterality Date    . CARDIAC CATHETERIZATION N/A 07/19/2016   Procedure: Right/Left Heart Cath and Coronary Angiography;  Surgeon: Troy Sine, MD;  Location: [facility];  Service: Cardiovascular;  Laterality: N/A;  . INGUINAL HERNIA REPAIR Bilateral        Home Medications    Prior to Admission medications   Medication Sig Start Date End Date Taking? Authorizing Provider  amitriptyline (ELAVIL) 25 MG tablet Take 25 mg by mouth at bedtime.    [provider]  aspirin 81 MG chewable tablet Chew 1 tablet (81 mg total) by mouth daily. 07/26/16   Regalado, Belkys A, MD  atorvastatin (LIPITOR) 80 MG tablet Take 1 tablet (80 mg total) by mouth daily at 6 PM. 07/25/16   Regalado, Belkys A, MD  carvedilol (COREG) 6.25 MG tablet Take 1 tablet (6.25 mg total) by mouth 2 (two) times daily with a meal. 07/25/16   Regalado, Belkys A, MD  esomeprazole (NEXIUM) 20 MG capsule Take 20 mg by mouth daily as needed.    [provider]  isosorbide mononitrate (IMDUR) 30 MG 24 hr tablet Take 1 tablet (30 mg total) by mouth daily. 07/25/16   Regalado, Belkys A, MD  lisinopril (PRINIVIL,ZESTRIL) 2.5 MG tablet Take 1 tablet (2.5 mg total) by mouth daily. 07/26/16   Regalado, Belkys A, MD  Multiple Vitamin (MULTIVITAMIN WITH MINERALS) TABS tablet Take 1 tablet by mouth daily. 07/26/16   Regalado, Cassie Freer, MD  nitroGLYCERIN (NITROSTAT)  0.4 MG SL tablet Place 1 tablet (0.4 mg total) under the tongue every 5 (five) minutes as needed for chest pain. 07/25/16   Regalado, Belkys A, MD  spironolactone (ALDACTONE) 25 MG tablet Take 0.5 tablets (12.5 mg total) by mouth daily. 07/26/16   Regalado, Belkys A, MD  traMADol (ULTRAM) 50 MG tablet Take 1 tablet (50 mg total) by mouth every 6 (six) hours as needed for moderate pain or severe pain. 07/25/16   Regalado, Cassie Freer, MD  UNABLE TO FIND Med Name: Med Pass 120 mL twice daily    [provider]    Family History Family History  Problem Relation Age of Onset  .  Alzheimer's disease Mother   . Diabetes Mother        Diabetes in the maternal side of the family but not in mother  . Heart disease Neg Hx   . Cancer Neg Hx     Social History Social History  Substance Use Topics  . Smoking status: Former Smoker    Quit date: 07/17/2016  . Smokeless tobacco: Former Systems developer     Comment: 1 pack per day ages [age]  . Alcohol use No     Comment: Former heavy binge drinker until 2014     Allergies   Chantix [varenicline]   Review of Systems Review of Systems  Negative except as stated in HPI.  Physical Exam Updated Vital Signs BP 124/82   Pulse 100   Temp 98.1 F (36.7 C) (Oral)   Resp 12   Ht 5\' 11"  (1.803 m)   Wt 68 kg (150 lb)   SpO2 99%   BMI 20.92 kg/m   Physical Exam GEN: Disheveled looking man lying in bed in NAD; alert and oriented. RESP: Coarse breath sounds diffusely. No wheezes, rales, or rhonchi. CV: Normal rate and regular rhythm. No murmurs, gallops, or rubs. No LE edema. No tenderness to palpation on anterior chest wall. ABD: Soft. Non-tender. Non-distended. Normoactive bowel sounds. EXT: No edema. Difficult to palpate DP pulses in bilateral LE. Right foot with increased erythema compared to left. Second toe on right foot with necrotic distal tip. Right foot tender to palpation. NEURO: Cranial nerves II-XII grossly intact. Able to lift all four extremities against gravity.  ED Treatments / Results  Labs (all labs ordered are listed, but only abnormal results are displayed) Labs Reviewed  CBC - Abnormal; Notable for the following:       Result Value   WBC 14.6 (*)    RBC 4.19 (*)    Hemoglobin 10.6 (*)    HCT 32.2 (*)    MCV 76.8 (*)    MCH 25.3 (*)    RDW 17.6 (*)    Platelets 100 (*)    All other components within normal limits  BASIC METABOLIC PANEL - Abnormal; Notable for the following:    Sodium 130 (*)    Chloride 98 (*)    CO2 18 (*)    Glucose, Bld 100 (*)    BUN 22 (*)    All other components within  normal limits  URINALYSIS, ROUTINE W REFLEX MICROSCOPIC  I-STAT TROPONIN, ED    EKG  EKG Interpretation  Date/Time:  Saturday July 16 2017 22:22:22 EDT Ventricular Rate:  99 PR Interval:    QRS Duration: 107 QT Interval:  367 QTC Calculation: 471 R Axis:   76 Text Interpretation:  Sinus rhythm ST-t wave abnormality Premature ventricular complexes Artifact Abnormal ekg Confirmed by Carmin Muskrat [phone]) on  07/16/2017 10:38:15 PM       Radiology Dg Chest 2 View  Result Date: 07/16/2017 CLINICAL DATA:  Dizziness and hypotension. History of hypertension. Former smoker. EXAM: CHEST  2 VIEW COMPARISON:  07/18/2016 FINDINGS: Shallow inspiration. Heart size and pulmonary vascularity are normal. Slight interstitial pattern to the lung bases likely representing fibrosis. Mild bronchiectasis. No airspace disease or consolidation. No blunting of costophrenic angles. No pneumothorax. Calcification of the aorta. Previous resection or resorption of the distal left clavicle. Old left rib fractures. 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 No evidence of active pulmonary disease. Aortic atherosclerosis. Electronically Signed   By: Lucienne Capers M.D.   On: 07/16/2017 23:41   Dg Foot 2 Views Right  Result Date: 07/16/2017 CLINICAL DATA:  Necrotic second toe for 10 months. Right foot pain. Dizziness and hypotension. History of hypertension. Former smoker. EXAM: RIGHT FOOT - 2 VIEW COMPARISON:  None. FINDINGS: Diffuse bone demineralization. Soft tissue defect at the tip of the right second toe with underlying irregularity of the distal phalangeal tuft likely representing osteomyelitis. No evidence of acute fracture or dislocation. Multiple exostoses demonstrated. Vascular calcifications. Soft tissues are otherwise unremarkable. IMPRESSION: Soft tissue defect at the tip of the right second toe with underlying bone erosion of the distal phalangeal tuft likely representing osteomyelitis.  Diffuse bone demineralization. Multiple bony exostoses. Electronically Signed   By: Lucienne Capers M.D.   On: 07/16/2017 23:40    Procedures Procedures (including critical care time)  Medications Ordered in ED Medications  vancomycin (VANCOCIN) IVPB 1000 mg/200 mL premix (not administered)  piperacillin-tazobactam (ZOSYN) IVPB 3.375 g (not administered)     Initial Impression / Assessment and Plan / ED Course  I have reviewed the triage vital signs and the nursing notes.  Pertinent labs & imaging results that were available during my care of the patient were reviewed by me and considered in my medical decision making (see chart for details).  Vincent Dunlap is a 76yo male with PMH significant for COPD, HFrEF (last EF 30-35%), CAD (with multivessel disease in 2017, not a candidate for CABG), HTN, and tobacco use who presents with worsening right foot pain for the last 10 months. Right second toe appears necrotic and on exam has increased erythema, as well as decreased DP pulses bilaterally. Likely has very poor circulation, leading to difficulty healing. Afebrile, hemodynamically stable. WBC 14.6. Will get x-ray of right foot to assess for possible osteomyelitis vs gangrenous toe from vasculopathy.  On further interviewing, patient states that he has intermittent chest pain relieved by NTG, most recently this morning. Patient had NSTEMI with multivessel disease in 2017, not a candidate for CABG. Concerning for ACS given his probable extensive vascular disease. Will order CXR, EKG, troponin.  He also states that he has had dysuria for the last year. Afebrile. BMP with Cr of 0.71. WBC 14.6. Will check UA.  Found to be hypotensive to 79/46 in triage. Receiving 1L NS bolus with improvement in BP to 110s-120s/70s-80s.  11:49pm Foot x-ray concerning for osteomyelitis. Starting vanc/zosyn. Will admit to hospitalist service for osteomyelitis.  Patient seen and discussed with Dr. Vanita Panda.  Final  Clinical Impressions(s) / ED Diagnoses   Final diagnoses:  Osteomyelitis of toe of right foot [id number] [facility])    New Prescriptions New Prescriptions   No medications on file     Colbert Ewing, MD 07/16/17 2352    Carmin Muskrat, MD 07/17/17 [phone]

## 2017-07-17 ENCOUNTER — Encounter (HOSPITAL_COMMUNITY): Payer: Self-pay | Admitting: Cardiology

## 2017-07-17 ENCOUNTER — Inpatient Hospital Stay (HOSPITAL_COMMUNITY): Payer: Medicare Other

## 2017-07-17 DIAGNOSIS — I25118 Atherosclerotic heart disease of native coronary artery with other forms of angina pectoris: Secondary | ICD-10-CM | POA: Diagnosis not present

## 2017-07-17 DIAGNOSIS — Z79899 Other long term (current) drug therapy: Secondary | ICD-10-CM | POA: Diagnosis not present

## 2017-07-17 DIAGNOSIS — Z87891 Personal history of nicotine dependence: Secondary | ICD-10-CM | POA: Diagnosis not present

## 2017-07-17 DIAGNOSIS — A419 Sepsis, unspecified organism: Secondary | ICD-10-CM | POA: Diagnosis not present

## 2017-07-17 DIAGNOSIS — M869 Osteomyelitis, unspecified: Secondary | ICD-10-CM | POA: Diagnosis present

## 2017-07-17 DIAGNOSIS — E871 Hypo-osmolality and hyponatremia: Secondary | ICD-10-CM | POA: Diagnosis not present

## 2017-07-17 DIAGNOSIS — I739 Peripheral vascular disease, unspecified: Secondary | ICD-10-CM | POA: Diagnosis not present

## 2017-07-17 DIAGNOSIS — Z7982 Long term (current) use of aspirin: Secondary | ICD-10-CM | POA: Diagnosis not present

## 2017-07-17 DIAGNOSIS — G894 Chronic pain syndrome: Secondary | ICD-10-CM | POA: Diagnosis present

## 2017-07-17 DIAGNOSIS — E860 Dehydration: Secondary | ICD-10-CM | POA: Diagnosis present

## 2017-07-17 DIAGNOSIS — N390 Urinary tract infection, site not specified: Secondary | ICD-10-CM | POA: Diagnosis present

## 2017-07-17 DIAGNOSIS — I959 Hypotension, unspecified: Secondary | ICD-10-CM | POA: Diagnosis present

## 2017-07-17 DIAGNOSIS — I5022 Chronic systolic (congestive) heart failure: Secondary | ICD-10-CM | POA: Diagnosis not present

## 2017-07-17 DIAGNOSIS — I96 Gangrene, not elsewhere classified: Secondary | ICD-10-CM | POA: Diagnosis not present

## 2017-07-17 DIAGNOSIS — I9589 Other hypotension: Secondary | ICD-10-CM | POA: Diagnosis not present

## 2017-07-17 DIAGNOSIS — I255 Ischemic cardiomyopathy: Secondary | ICD-10-CM | POA: Diagnosis present

## 2017-07-17 DIAGNOSIS — I110 Hypertensive heart disease with heart failure: Secondary | ICD-10-CM | POA: Diagnosis not present

## 2017-07-17 DIAGNOSIS — I252 Old myocardial infarction: Secondary | ICD-10-CM | POA: Diagnosis not present

## 2017-07-17 DIAGNOSIS — S8002XA Contusion of left knee, initial encounter: Secondary | ICD-10-CM | POA: Diagnosis not present

## 2017-07-17 DIAGNOSIS — E785 Hyperlipidemia, unspecified: Secondary | ICD-10-CM | POA: Diagnosis present

## 2017-07-17 DIAGNOSIS — I340 Nonrheumatic mitral (valve) insufficiency: Secondary | ICD-10-CM | POA: Diagnosis not present

## 2017-07-17 DIAGNOSIS — I351 Nonrheumatic aortic (valve) insufficiency: Secondary | ICD-10-CM | POA: Diagnosis not present

## 2017-07-17 DIAGNOSIS — I70261 Atherosclerosis of native arteries of extremities with gangrene, right leg: Secondary | ICD-10-CM | POA: Diagnosis not present

## 2017-07-17 DIAGNOSIS — L8990 Pressure ulcer of unspecified site, unspecified stage: Secondary | ICD-10-CM | POA: Insufficient documentation

## 2017-07-17 DIAGNOSIS — K219 Gastro-esophageal reflux disease without esophagitis: Secondary | ICD-10-CM | POA: Diagnosis present

## 2017-07-17 DIAGNOSIS — R079 Chest pain, unspecified: Secondary | ICD-10-CM | POA: Diagnosis not present

## 2017-07-17 DIAGNOSIS — D693 Immune thrombocytopenic purpura: Secondary | ICD-10-CM | POA: Diagnosis not present

## 2017-07-17 DIAGNOSIS — F329 Major depressive disorder, single episode, unspecified: Secondary | ICD-10-CM | POA: Diagnosis present

## 2017-07-17 DIAGNOSIS — Z720 Tobacco use: Secondary | ICD-10-CM

## 2017-07-17 DIAGNOSIS — J449 Chronic obstructive pulmonary disease, unspecified: Secondary | ICD-10-CM | POA: Diagnosis not present

## 2017-07-17 DIAGNOSIS — K5900 Constipation, unspecified: Secondary | ICD-10-CM | POA: Diagnosis not present

## 2017-07-17 DIAGNOSIS — I10 Essential (primary) hypertension: Secondary | ICD-10-CM | POA: Diagnosis not present

## 2017-07-17 DIAGNOSIS — L97514 Non-pressure chronic ulcer of other part of right foot with necrosis of bone: Secondary | ICD-10-CM | POA: Diagnosis present

## 2017-07-17 DIAGNOSIS — L89152 Pressure ulcer of sacral region, stage 2: Secondary | ICD-10-CM | POA: Diagnosis present

## 2017-07-17 DIAGNOSIS — E43 Unspecified severe protein-calorie malnutrition: Secondary | ICD-10-CM | POA: Diagnosis not present

## 2017-07-17 DIAGNOSIS — N3000 Acute cystitis without hematuria: Secondary | ICD-10-CM | POA: Diagnosis not present

## 2017-07-17 LAB — LIPID PANEL
CHOL/HDL RATIO: 2.5 ratio
Cholesterol: 81 mg/dL (ref 0–200)
HDL: 32 mg/dL — ABNORMAL LOW (ref >40)
LDL CALC: 38 mg/dL (ref 0–99)
Triglycerides: 57 mg/dL (ref <150)
VLDL: 11 mg/dL (ref 0–40)

## 2017-07-17 LAB — ECHOCARDIOGRAM COMPLETE
Height: 71 in
Weight: 2499.2 oz

## 2017-07-17 LAB — TROPONIN I: Troponin I: <0.03 ng/mL (ref <0.03)

## 2017-07-17 LAB — GLUCOSE, CAPILLARY: GLUCOSE-CAPILLARY: 109 mg/dL — AB (ref 65–99)

## 2017-07-17 LAB — LACTIC ACID, PLASMA
LACTIC ACID, VENOUS: 1.4 mmol/L (ref 0.5–1.9)
Lactic Acid, Venous: 0.8 mmol/L (ref 0.5–1.9)

## 2017-07-17 LAB — BASIC METABOLIC PANEL
ANION GAP: 6 (ref 5–15)
BUN: 13 mg/dL (ref 6–20)
CALCIUM: 8.6 mg/dL — AB (ref 8.9–10.3)
CO2: 24 mmol/L (ref 22–32)
Chloride: 103 mmol/L (ref 101–111)
Creatinine, Ser: 0.54 mg/dL — ABNORMAL LOW (ref 0.61–1.24)
GFR calc Af Amer: >60 mL/min (ref >60)
Glucose, Bld: 106 mg/dL — ABNORMAL HIGH (ref 65–99)
POTASSIUM: 3.6 mmol/L (ref 3.5–5.1)
SODIUM: 133 mmol/L — AB (ref 135–145)

## 2017-07-17 LAB — I-STAT TROPONIN, ED: Troponin i, poc: 0.01 ng/mL (ref 0.00–0.08)

## 2017-07-17 LAB — CBC
HEMATOCRIT: 30.4 % — AB (ref 39.0–52.0)
HEMOGLOBIN: 9.9 g/dL — AB (ref 13.0–17.0)
MCH: 25 pg — AB (ref 26.0–34.0)
MCHC: 32.6 g/dL (ref 30.0–36.0)
MCV: 76.8 fL — ABNORMAL LOW (ref 78.0–100.0)
PLATELETS: 56 10*3/uL — AB (ref 150–400)
RBC: 3.96 MIL/uL — AB (ref 4.22–5.81)
RDW: 17.5 % — AB (ref 11.5–15.5)
WBC: 11.1 10*3/uL — AB (ref 4.0–10.5)

## 2017-07-17 LAB — PROTIME-INR
INR: 1.12
PROTHROMBIN TIME: 14.3 s (ref 11.4–15.2)

## 2017-07-17 LAB — PROCALCITONIN: Procalcitonin: <0.1 ng/mL

## 2017-07-17 LAB — SEDIMENTATION RATE: Sed Rate: 22 mm/hr — ABNORMAL HIGH (ref 0–16)

## 2017-07-17 LAB — BRAIN NATRIURETIC PEPTIDE: B Natriuretic Peptide: 145.4 pg/mL — ABNORMAL HIGH (ref 0.0–100.0)

## 2017-07-17 LAB — C-REACTIVE PROTEIN: CRP: 2 mg/dL — ABNORMAL HIGH (ref <1.0)

## 2017-07-17 LAB — APTT: APTT: 31 s (ref 24–36)

## 2017-07-17 MED ORDER — ONDANSETRON HCL 4 MG PO TABS: 4.0000 mg | ORAL_TABLET | Freq: Four times a day (QID) | ORAL | Status: DC | PRN

## 2017-07-17 MED ORDER — VANCOMYCIN HCL IN DEXTROSE 750-5 MG/150ML-% IV SOLN
750.0000 mg | Freq: Two times a day (BID) | INTRAVENOUS | Status: DC
  Administered 2017-07-17 – 2017-07-28 (×23): 750 mg via INTRAVENOUS
  Filled 2017-07-17 (×25): qty 150

## 2017-07-17 MED ORDER — PIPERACILLIN-TAZOBACTAM 3.375 G IVPB
3.3750 g | Freq: Three times a day (TID) | INTRAVENOUS | Status: DC
  Administered 2017-07-17 – 2017-07-29 (×35): 3.375 g via INTRAVENOUS
  Filled 2017-07-17 (×38): qty 50

## 2017-07-17 MED ORDER — OXYCODONE-ACETAMINOPHEN 5-325 MG PO TABS
1.0000 | ORAL_TABLET | ORAL | Status: DC | PRN
  Administered 2017-07-17 – 2017-07-30 (×33): 1 via ORAL
  Filled 2017-07-17 (×34): qty 1

## 2017-07-17 MED ORDER — MORPHINE SULFATE (PF) 2 MG/ML IV SOLN
2.0000 mg | INTRAVENOUS | Status: DC | PRN
  Administered 2017-07-27 – 2017-07-29 (×4): 2 mg via INTRAVENOUS
  Filled 2017-07-17 (×4): qty 1

## 2017-07-17 MED ORDER — HYDRALAZINE HCL 20 MG/ML IJ SOLN: 5.0000 mg | INTRAMUSCULAR | Status: DC | PRN

## 2017-07-17 MED ORDER — CARVEDILOL 6.25 MG PO TABS
6.2500 mg | ORAL_TABLET | Freq: Two times a day (BID) | ORAL | Status: DC
  Administered 2017-07-17 – 2017-07-30 (×28): 6.25 mg via ORAL
  Filled 2017-07-17 (×17): qty 1
  Filled 2017-07-17: qty 2
  Filled 2017-07-17 (×11): qty 1

## 2017-07-17 MED ORDER — SODIUM CHLORIDE 0.9 % IV SOLN
INTRAVENOUS | Status: DC
  Administered 2017-07-17: 04:00:00 via INTRAVENOUS
  Administered 2017-07-20 – 2017-07-21 (×2): 50 mL/h via INTRAVENOUS
  Administered 2017-07-23: 22:00:00 via INTRAVENOUS

## 2017-07-17 MED ORDER — ONDANSETRON HCL 4 MG/2ML IJ SOLN: 4.0000 mg | Freq: Four times a day (QID) | INTRAMUSCULAR | Status: DC | PRN

## 2017-07-17 MED ORDER — NITROGLYCERIN 0.4 MG SL SUBL: 0.4000 mg | SUBLINGUAL_TABLET | SUBLINGUAL | Status: DC | PRN

## 2017-07-17 MED ORDER — ZOLPIDEM TARTRATE 5 MG PO TABS
5.0000 mg | ORAL_TABLET | Freq: Every evening | ORAL | Status: DC | PRN
Start: 2017-07-17 — End: 2017-07-30
  Administered 2017-07-18 – 2017-07-29 (×10): 5 mg via ORAL
  Filled 2017-07-17 (×10): qty 1

## 2017-07-17 MED ORDER — ASPIRIN 81 MG PO CHEW
81.0000 mg | CHEWABLE_TABLET | Freq: Every day | ORAL | Status: DC
  Administered 2017-07-17 – 2017-07-30 (×13): 81 mg via ORAL
  Filled 2017-07-17 (×13): qty 1

## 2017-07-17 MED ORDER — NICOTINE 14 MG/24HR TD PT24
14.0000 mg | MEDICATED_PATCH | Freq: Every day | TRANSDERMAL | Status: DC
  Administered 2017-07-17 – 2017-07-30 (×13): 14 mg via TRANSDERMAL
  Filled 2017-07-17 (×13): qty 1

## 2017-07-17 MED ORDER — ALBUTEROL SULFATE (2.5 MG/3ML) 0.083% IN NEBU: 2.5000 mg | INHALATION_SOLUTION | RESPIRATORY_TRACT | Status: DC | PRN

## 2017-07-17 MED ORDER — ATORVASTATIN CALCIUM 80 MG PO TABS
80.0000 mg | ORAL_TABLET | Freq: Every day | ORAL | Status: DC
  Administered 2017-07-17 – 2017-07-30 (×14): 80 mg via ORAL
  Filled 2017-07-17 (×14): qty 1

## 2017-07-17 MED ORDER — ISOSORBIDE MONONITRATE ER 30 MG PO TB24
30.0000 mg | ORAL_TABLET | ORAL | Status: DC
  Administered 2017-07-17 – 2017-07-30 (×13): 30 mg via ORAL
  Filled 2017-07-17 (×13): qty 1

## 2017-07-17 MED ORDER — ADULT MULTIVITAMIN W/MINERALS CH
1.0000 | ORAL_TABLET | Freq: Every day | ORAL | Status: DC
  Administered 2017-07-17 – 2017-07-30 (×13): 1 via ORAL
  Filled 2017-07-17 (×14): qty 1

## 2017-07-17 NOTE — Progress Notes (Signed)
  Echocardiogram 2D Echocardiogram has been performed.  Bobbye Charleston 07/17/2017, 11:18 AM

## 2017-07-17 NOTE — Consult Note (Signed)
CARDIOLOGY CONSULT NOTE     Primary Care Physician: Thressa Sheller, MD  Referring Physician:  Dr Blaine Hamper  Admit Date: 07/16/2017  Reason for consultation:  CAD  Vincent Dunlap is a 76 y.o. 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  The patient was last seen in September of 2017.  He had cath which revealed severe 30 vessel diesase (70% distal L main) with EF 30%.  He was evaluated by Dr Servando Snare and he was felt to not be a reasonable candidate for surery due to poor functional status and minimal symptoms.  He has a poor living situation and is chronically quite debilitated.  The patient reports doing very well from a CV standpoint over the last year.  He denies frequent difficult with CP or SOB.  He has stable angina and occasionally uses nitroglycerine.  He reports that he has a chronic wound on his toe which is his primary concern.  He has had recent fevers and chills and reports that last night the nurse who was checking on him found him to be hypotensive (79/46) and advised that he come for admission.   Today, he denies symptoms of palpitations, orthopnea, PND, lower extremity edema, dizziness, presyncope, syncope, or neurologic sequela. The patient is tolerating medications without difficulties and is otherwise without complaint today.   Past Medical History:  Diagnosis Date  . CAD (coronary artery disease) 07/2016   3 vessel disease,  evaluated by Dr Servando Snare and felt to be a poor surgical candidate  . Hypertension   . Ischemic cardiomyopathy 2017   EF 30%  . Tobacco abuse    Past Surgical History:  Procedure Laterality Date  . CARDIAC CATHETERIZATION N/A 07/19/2016   Procedure: Right/Left Heart Cath and Coronary Angiography;  Surgeon: Troy Sine, MD;  Location: [facility];  Service: Cardiovascular;  Laterality: N/A;  . INGUINAL HERNIA REPAIR Bilateral     . aspirin  81 mg Oral Daily  . atorvastatin  80 mg Oral q1800    . carvedilol  6.25 mg Oral BID WC  . isosorbide mononitrate  30 mg Oral Q24H  . multivitamin with minerals  1 tablet Oral Daily   . sodium chloride 50 mL/hr at 07/17/17 0335  . piperacillin-tazobactam (ZOSYN)  IV Stopped (07/17/17 0920)  . vancomycin Stopped (07/17/17 1109)    Allergies  Allergen Reactions  . Chantix [Varenicline] Other (See Comments)    Pt states it made him crazy    Social History   Social History  . Marital status: Single    Spouse name: N/A  . Number of children: N/A  . Years of education: N/A   Occupational History  . Not on file.   Social History Main Topics  . Smoking status: Current Every Day Smoker  . Smokeless tobacco: Former Systems developer     Comment: 1 pack per day ages [age]  . Alcohol use No     Comment: Former heavy binge drinker until 2014  . Drug use: No  . Sexual activity: Not on file   Other Topics Concern  . Not on file   Social History Narrative  . No narrative on file    Family History  Problem Relation Age of Onset  . Alzheimer's disease Mother   . Diabetes Mother        Diabetes in the maternal side of the family but not in mother  . Heart disease Neg  Hx   . Cancer Neg Hx     ROS- All systems are reviewed and negative except as per the HPI above  Physical Exam: Telemetry: Vitals:   07/17/17 0130 07/17/17 0215 07/17/17 0534 07/17/17 1001  BP: 114/69 (!) 123/92 118/72 109/65  Pulse: 89 92 90 69  Resp: 16 18 18 18   Temp:  97.8 F (36.6 C) 97.6 F (36.4 C) 98.4 F (36.9 C)  TempSrc:  Oral Oral Oral  SpO2: 97% 99% 96% 99%  Weight:  156 lb 3.2 oz (70.9 kg)    Height:  5' 11"  (1.803 m)      GEN- The patient is disheveled and chronically ill appearing, alert  Head- normocephalic, atraumatic Eyes-  Sclera clear, conjunctiva pink Ears- hearing intact Oropharynx- clear Neck- supple,  Lungs- Clear to ausculation bilaterally, normal work of breathing Heart- Regular rate and rhythm  GI- soft, NT, ND, + BS Extremities-  no edema,  R foot tender with second toe necrotic tip MS-diffuse atrophy atrophy Skin- R foot tender with second toe necrotic tip Psych- euthymic mood, full affect Neuro- strength and sensation are intact  EKG: sinus rhythm with rare PVCs,  ST depression noted on ekg 07/16/17 now resolved  Labs:   Lab Results  Component Value Date   WBC 11.1 (H) 07/17/2017   HGB 9.9 (L) 07/17/2017   HCT 30.4 (L) 07/17/2017   MCV 76.8 (L) 07/17/2017   PLT 56 (L) 07/17/2017     Recent Labs Lab 07/17/17 0643  NA 133*  K 3.6  CL 103  CO2 24  BUN 13  CREATININE 0.54*  CALCIUM 8.6*  GLUCOSE 106*   Lab Results  Component Value Date   TROPONINI <0.03 07/17/2017    Lab Results  Component Value Date   CHOL 81 07/17/2017   CHOL 151 07/17/2016   Lab Results  Component Value Date   HDL 32 (L) 07/17/2017   HDL 67 07/17/2016   Lab Results  Component Value Date   LDLCALC 38 07/17/2017   LDLCALC 77 07/17/2016   Lab Results  Component Value Date   TRIG 57 07/17/2017   TRIG 33 07/17/2016   Lab Results  Component Value Date   CHOLHDL 2.5 07/17/2017   CHOLHDL 2.3 07/17/2016   No results found for: LDLDIRECT    Radiology:  CXR reveals interstitial fibrosis, no active process  Echo:  EF 55%, suboptimal images, basal inferior HK, mild to moderate MR  ASSESSMENT AND PLAN:   1. CAD The patient has known 3V CAD for which he was previously turned down for surgery.  He has stable angina chronically which by his history today appears to be well controlled.  He did have ST depression yesterday in the setting of hypotension, likely due to sepsis.   This has since resolved.  Currently, he is without symptoms of angina. He is on a good medical regimen of ASA, high dose statin, beta blocker, and nitrates.  I do not feel that changes are required at this time.  Currently, there is no indication for inpatient cardiac procedures.  Given his overall prognosis, a conservative approach may be best unless  he develops worsening symptoms refractory to medical therapy. No new recs.  Cardiology team to follow while here.    2. Ischemic CM EF has recovered with medical therapy Continue current medicine strategy  3. Necrotic toe Wound care per primary team Could consider ABIs/ further testing of arterial flow to see if vascular intervention would be beneficial for  healing.  4. Tobacco Unfortunately, he is still smoking.  Cessation is advised.   Thompson Grayer, MD 07/17/2017  1:56 PM

## 2017-07-17 NOTE — Progress Notes (Signed)
[facility] PROGRESS NOTE  QUADARIUS HENTON QGB:[id number] DOB: Mar 05, 1941 DOA: 07/16/2017 PCP: Thressa Sheller, MD  Interim summary and history of present illness 76 y.o. male with medical history significant of hypertension, hyperlipidemia, COPD, GERD, depression, formal smoker, sCHF with EF of 30-35%, CAD (multivessel disease, but not a candidate for CABG), chronic thrombocytopenia, who presents with foot pain, chest pain, and dysuria. Patient found to have sepsis most likely due to UTI and osteomyelitis on his right second toe.  Assessment/Plan: 1-sepsis secondary to osteomyelitis and UTI -Patient responded well to IV fluid resuscitation and the use of broad-spectrum antibiotics -Currently hemodynamically stable -Will follow blood cultures and urine culture -Continue current antibiotics -Follow orthopedic surgery recommendations. -Assessment of vasculature integrity on his right leg with ABI has been ordered  2-intermittent chest discomfort in patient with history of coronary artery disease: Patient had a history of known coronary artery disease (multiple vessels) found not a candidate for CABG. -He has been experiencing stable angina -Continue conservative management with the use of aspirin, statins, beta blocker and imdur -Cardiology has seen patient and recommend no further ischemic workup at this moment. -Patient currently is asymptomatic and his troponin has been negative.  3-history of COPD -No wheezing and with good air movement -Will continue as needed albuterol nebulizer  4-chronic idiopathic thrombocytopenia -No signs of acute bleeding -Will follow intermittently platelets trend  5-tobacco abuse -Cessation counseling provided -Nicotine patch has been ordered  6-hypertension -Well-controlled in a stable -Will continue current antihypertensive regimen  7-hyperlipidemia -Will continue Lipitor   Code Status: Full code Family Communication: None at  bedside Disposition Plan: To be determined. Will check right lower extremity ABI to assess vascular integrity. Orthopedic surgery has been consulted for evaluation and to help determine salvage interventions versus amputation of second right toe. Continue IV antibiotics. Follow urine and blood cultures.  Consultants:  Neurology service  Orthopedic service  Procedures:  See below for x-ray reports   2-D echo - Procedure narrative: Transthoracic echocardiography. Image   quality was suboptimal. The study was technically difficult, as a   result of poor acoustic windows, poor sound wave transmission,   and body habitus. - Left ventricle: The cavity size was normal. Systolic function was   normal. The estimated ejection fraction was 55%. Doppler   parameters are consistent with abnormal left ventricular   relaxation (grade 1 diastolic dysfunction). - Regional wall motion abnormality: Hypokinesis of the basal   inferior myocardium. - Aortic valve: Mildly thickened, mildly calcified leaflets. There   was mild regurgitation. - Mitral valve: Mildly thickened leaflets . There was mild to   moderate regurgitation. - Right ventricle: Systolic function was mildly reduced. - Tricuspid valve: There was mild regurgitation.   Right lower extremity ABI  Antibiotics:  Vancomycin and Zosyn 9/8  HPI/Subjective: Afebrile, denies chest pain, no shortness of breath or palpitations.   Objective: Vitals:   07/17/17 1001 07/17/17 1300  BP: 109/65 97/71  Pulse: 69 69  Resp: 18 18  Temp: 98.4 F (36.9 C) 98.2 F (36.8 C)  SpO2: 99% 100%    Intake/Output Summary (Last 24 hours) at 07/17/17 1629 Last data filed at 07/17/17 1300  Gross per 24 hour  Intake          1010.83 ml  Output              826 ml  Net           184.83 ml   Filed Weights   07/16/17 2108 07/17/17  0215  Weight: 68 kg (150 lb) 70.9 kg (156 lb 3.2 oz)    Exam:   General:  Currently afebrile, denying chest pain  or shortness of breath. Patient reports some intermittent dysuria and right foot pain.  Cardiovascular: S1 and S2, no rubs, no gallops; no JVD appreciated.  Respiratory: good air movement bilaterally, no wheezing, no crackles, normal respiratory effort.  Abdomen: soft, nontender, nondistended, positive bowel sounds   Musculoskeletal: Trace edema appreciated on his right lower extremity, very mild erythematous changes in the dorsal aspect and the second toe with necrotic teeth seen and examined. Patient reports some pain. Left lower extremity without any acute abnormalities.  Data Reviewed: Basic Metabolic Panel:  Recent Labs Lab 07/16/17 2114 07/17/17 0643  NA 130* 133*  K 3.7 3.6  CL 98* 103  CO2 18* 24  GLUCOSE 100* 106*  BUN 22* 13  CREATININE 0.71 0.54*  CALCIUM 9.1 8.6*   CBC:  Recent Labs Lab 07/16/17 2114 07/17/17 0643  WBC 14.6* 11.1*  HGB 10.6* 9.9*  HCT 32.2* 30.4*  MCV 76.8* 76.8*  PLT 100* 56*   Cardiac Enzymes:  Recent Labs Lab 07/17/17 0317 07/17/17 0643 07/17/17 1339  TROPONINI <0.03 <0.03 <0.03   BNP (last 3 results)  Recent Labs  07/17/17 0317  BNP 145.4*   CBG:  Recent Labs Lab 07/17/17 0738  GLUCAP 109*   Studies: Dg Chest 2 View  Result Date: 07/16/2017 CLINICAL DATA:  Dizziness and hypotension. History of hypertension. Former smoker. EXAM: CHEST  2 VIEW COMPARISON:  07/18/2016 FINDINGS: Shallow inspiration. Heart size and pulmonary vascularity are normal. Slight interstitial pattern to the lung bases likely representing fibrosis. Mild bronchiectasis. No airspace disease or consolidation. No blunting of costophrenic angles. No pneumothorax. Calcification of the aorta. Previous resection or resorption of the distal left clavicle. Old left rib fractures. 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 No evidence of active pulmonary disease. Aortic atherosclerosis. Electronically Signed   By: Lucienne Capers M.D.   On:  07/16/2017 23:41   Dg Foot 2 Views Right  Result Date: 07/16/2017 CLINICAL DATA:  Necrotic second toe for 10 months. Right foot pain. Dizziness and hypotension. History of hypertension. Former smoker. EXAM: RIGHT FOOT - 2 VIEW COMPARISON:  None. FINDINGS: Diffuse bone demineralization. Soft tissue defect at the tip of the right second toe with underlying irregularity of the distal phalangeal tuft likely representing osteomyelitis. No evidence of acute fracture or dislocation. Multiple exostoses demonstrated. Vascular calcifications. Soft tissues are otherwise unremarkable. IMPRESSION: Soft tissue defect at the tip of the right second toe with underlying bone erosion of the distal phalangeal tuft likely representing osteomyelitis. Diffuse bone demineralization. Multiple bony exostoses. Electronically Signed   By: Lucienne Capers M.D.   On: 07/16/2017 23:40    Scheduled Meds: . aspirin  81 mg Oral Daily  . atorvastatin  80 mg Oral q1800  . carvedilol  6.25 mg Oral BID WC  . isosorbide mononitrate  30 mg Oral Q24H  . multivitamin with minerals  1 tablet Oral Daily   Continuous Infusions: . sodium chloride 50 mL/hr at 07/17/17 0335  . piperacillin-tazobactam (ZOSYN)  IV 3.375 g (07/17/17 1550)  . 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 If 7PM-7AM, please contact night-coverage at [email or web address], password [facility] 07/17/2017, 4:29 PM  LOS: 0 days

## 2017-07-17 NOTE — Progress Notes (Signed)
Pharmacy Antibiotic Note  Vincent Dunlap is a 76 y.o. male admitted on 07/16/2017 with osteomyelitis.  Pharmacy has been consulted for Vancocin and Zosyn dosing.  Plan: Vanc 1g and Zosyn 3.375g given in ED. Vancomycin 750mg  IV every 12 hours.  Goal trough 15-20 mcg/mL. Zosyn 3.375g IV q8h (4 hour infusion).  Height: 5\' 11"  (180.3 cm) Weight: 150 lb (68 kg) IBW/kg (Calculated) : 75.3  Temp (24hrs), Avg:98.1 F (36.7 C), Min:98.1 F (36.7 C), Max:98.1 F (36.7 C)   Recent Labs Lab 07/16/17 2114  WBC 14.6*  CREATININE 0.71    Estimated Creatinine Clearance: 76.7 mL/min (by C-G formula based on SCr of 0.71 mg/dL).    Allergies  Allergen Reactions  . Chantix [Varenicline] Other (See Comments)    Pt states it made him crazy    Thank you for allowing pharmacy to be a part of this patient's care.  Wynona Neat, PharmD, BCPS  07/17/2017 1:39 AM

## 2017-07-18 ENCOUNTER — Encounter (HOSPITAL_COMMUNITY): Payer: Medicare Other

## 2017-07-18 DIAGNOSIS — A419 Sepsis, unspecified organism: Principal | ICD-10-CM

## 2017-07-18 DIAGNOSIS — I96 Gangrene, not elsewhere classified: Secondary | ICD-10-CM

## 2017-07-18 DIAGNOSIS — I25118 Atherosclerotic heart disease of native coronary artery with other forms of angina pectoris: Secondary | ICD-10-CM

## 2017-07-18 DIAGNOSIS — M869 Osteomyelitis, unspecified: Secondary | ICD-10-CM

## 2017-07-18 DIAGNOSIS — I5022 Chronic systolic (congestive) heart failure: Secondary | ICD-10-CM

## 2017-07-18 DIAGNOSIS — I9589 Other hypotension: Secondary | ICD-10-CM

## 2017-07-18 LAB — HEMOGLOBIN A1C
HEMOGLOBIN A1C: 5.1 % (ref 4.8–5.6)
MEAN PLASMA GLUCOSE: 100 mg/dL

## 2017-07-18 LAB — GLUCOSE, CAPILLARY: Glucose-Capillary: 109 mg/dL — ABNORMAL HIGH (ref 65–99)

## 2017-07-18 LAB — URINE CULTURE

## 2017-07-18 MED ORDER — ENSURE ENLIVE PO LIQD
237.0000 mL | Freq: Two times a day (BID) | ORAL | Status: DC
  Administered 2017-07-18 – 2017-07-29 (×16): 237 mL via ORAL

## 2017-07-18 NOTE — Progress Notes (Signed)
[facility] PROGRESS NOTE  Vincent Dunlap TSV:[id number] DOB: [birth year] DOA: 07/16/2017 PCP: Thressa Sheller, MD  Interim summary and history of present illness 76 y.o. male with medical history significant of hypertension, hyperlipidemia, COPD, GERD, depression, formal smoker, sCHF with EF of 30-35%, CAD (multivessel disease, but not a candidate for CABG), chronic thrombocytopenia, who presents with foot pain, chest pain, and dysuria. Patient found to have sepsis most likely due to UTI and osteomyelitis on his right second toe.  Assessment/Plan: 1-sepsis secondary to osteomyelitis and UTI -Patient responded well to IV fluid resuscitation and the use of broad-spectrum antibiotics -overall hemodynamically stable -Will follow blood cultures and urine culture -Continue current antibiotics -Follow orthopedic surgery recommendations. -Assessment of vasculature integrity on his right leg with ABI; follow results  2-intermittent chest discomfort in patient with history of coronary artery disease: Patient had a history of known coronary artery disease (multiple vessels) found not a candidate for CABG. -He has been experiencing stable angina for years -Continue conservative management with the use of aspirin, statins, beta blocker and imdur -Cardiology has seen patient and recommend no further ischemic workup at this moment. -Patient currently is asymptomatic and his troponin has been negative.  3-history of COPD -No wheezing and with good air movement -Will continue as needed albuterol nebulizer  4-chronic idiopathic thrombocytopenia -No signs of acute bleeding -Will follow intermittently platelets trend  5-tobacco abuse -Cessation counseling provided -Nicotine patch has been ordered  6-hypertension -Well-controlled in a stable -Will continue current antihypertensive regimen   7-hyperlipidemia -continue lipitor  8-stage 2 sacral pressure injury -continue preventive  measures -overlay mattress ordered   Code Status: Full code Family Communication: None at bedside Disposition Plan: To be determined. Will follow right lower extremity ABI to assess vascular integrity. Orthopedic surgery has been consulted for evaluation and to help determine salvage interventions versus amputation. Continue antibiotics. Consultants:  Neurology service  Orthopedic service  Procedures:  See below for x-ray reports   2-D echo - Procedure narrative: Transthoracic echocardiography. Image   quality was suboptimal. The study was technically difficult, as a   result of poor acoustic windows, poor sound wave transmission,   and body habitus. - Left ventricle: The cavity size was normal. Systolic function was   normal. The estimated ejection fraction was 55%. Doppler   parameters are consistent with abnormal left ventricular   relaxation (grade 1 diastolic dysfunction). - Regional wall motion abnormality: Hypokinesis of the basal   inferior myocardium. - Aortic valve: Mildly thickened, mildly calcified leaflets. There   was mild regurgitation. - Mitral valve: Mildly thickened leaflets . There was mild to   moderate regurgitation. - Right ventricle: Systolic function was mildly reduced. - Tricuspid valve: There was mild regurgitation.   Right lower extremity ABI  Antibiotics:  Vancomycin and Zosyn 9/8  HPI/Subjective: Afebrile, no CP, no SOB. Patient reports no nausea, no vomiting. Still with some pain in his right foot, even swelling and redness significantly improved.  Objective: Vitals:   07/18/17 0800 07/18/17 1115  BP: (!) 117/56 (!) 97/47  Pulse: 81 78  Resp: 14   Temp: (!) 97.3 F (36.3 C) 97.6 F (36.4 C)  SpO2: 98% 96%    Intake/Output Summary (Last 24 hours) at 07/18/17 1920 Last data filed at 07/18/17 1917  Gross per 24 hour  Intake          2488.33 ml  Output             2170 ml  Net  318.33 ml   Filed Weights   07/16/17 2108  07/17/17 0215 07/18/17 0447  Weight: 68 kg (150 lb) 70.9 kg (156 lb 3.2 oz) 70.4 kg (155 lb 1.6 oz)    Exam:   General:  Afebrile, reports no CP and endorses no palpitations, nausea or vomiting.  Patient denies dysuria and even still having pain on his right foot is feeling much better.   Cardiovascular: S1 and S2, no rubs, no gallops, no JVD.  Respiratory: normal resp effort, no wheezing, no crackles.  Abdomen: soft, NT, ND, positive BS  Musculoskeletal: Trace edema appreciated on his right lower extremity, improved in erythematous changes, continue having right 2nd toe necrotic tip (with slight drainage). Decrease pulses (pedall and popliteal).   Skin: stage 2 sacral pressure injury, no superimposed infection appreciated.  Data Reviewed: Basic Metabolic Panel:  Recent Labs Lab 07/16/17 2114 07/17/17 0643  NA 130* 133*  K 3.7 3.6  CL 98* 103  CO2 18* 24  GLUCOSE 100* 106*  BUN 22* 13  CREATININE 0.71 0.54*  CALCIUM 9.1 8.6*   CBC:  Recent Labs Lab 07/16/17 2114 07/17/17 0643  WBC 14.6* 11.1*  HGB 10.6* 9.9*  HCT 32.2* 30.4*  MCV 76.8* 76.8*  PLT 100* 56*   Cardiac Enzymes:  Recent Labs Lab 07/17/17 0317 07/17/17 0643 07/17/17 1339  TROPONINI <0.03 <0.03 <0.03   BNP (last 3 results)  Recent Labs  07/17/17 0317  BNP 145.4*   CBG:  Recent Labs Lab 07/17/17 0738 07/18/17 0539  GLUCAP 109* 109*   Studies: Dg Chest 2 View  Result Date: 07/16/2017 CLINICAL DATA:  Dizziness and hypotension. History of hypertension. Former smoker. EXAM: CHEST  2 VIEW COMPARISON:  07/18/2016 FINDINGS: Shallow inspiration. Heart size and pulmonary vascularity are normal. Slight interstitial pattern to the lung bases likely representing fibrosis. Mild bronchiectasis. No airspace disease or consolidation. No blunting of costophrenic angles. No pneumothorax. Calcification of the aorta. Previous resection or resorption of the distal left clavicle. Old left rib fractures.  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 No evidence of active pulmonary disease. Aortic atherosclerosis. Electronically Signed   By: Lucienne Capers M.D.   On: 07/16/2017 23:41   Dg Foot 2 Views Right  Result Date: 07/16/2017 CLINICAL DATA:  Necrotic second toe for 10 months. Right foot pain. Dizziness and hypotension. History of hypertension. Former smoker. EXAM: RIGHT FOOT - 2 VIEW COMPARISON:  None. FINDINGS: Diffuse bone demineralization. Soft tissue defect at the tip of the right second toe with underlying irregularity of the distal phalangeal tuft likely representing osteomyelitis. No evidence of acute fracture or dislocation. Multiple exostoses demonstrated. Vascular calcifications. Soft tissues are otherwise unremarkable. IMPRESSION: Soft tissue defect at the tip of the right second toe with underlying bone erosion of the distal phalangeal tuft likely representing osteomyelitis. Diffuse bone demineralization. Multiple bony exostoses. Electronically Signed   By: Lucienne Capers M.D.   On: 07/16/2017 23:40    Scheduled Meds: . aspirin  81 mg Oral Daily  . atorvastatin  80 mg Oral q1800  . carvedilol  6.25 mg Oral BID WC  . feeding supplement (ENSURE ENLIVE)  237 mL Oral BID BM  . isosorbide mononitrate  30 mg Oral Q24H  . multivitamin with minerals  1 tablet Oral Daily  . nicotine  14 mg Transdermal Daily   Continuous Infusions: . sodium chloride 50 mL/hr at 07/17/17 0335  . piperacillin-tazobactam (ZOSYN)  IV Stopped (07/18/17 1710)  . vancomycin Stopped (07/18/17 1020)  Principal Problem:   Osteomyelitis of toe of right foot ([place]) Active Problems:   CAD (coronary artery disease), native coronary artery   Chronic systolic CHF (congestive heart failure) (HCC)   COPD (chronic obstructive pulmonary disease) (HCC)   Chronic idiopathic thrombocytopenia (HCC)   UTI (urinary tract infection)   Sepsis (HCC)   Hypotension   Chest pain   Pressure injury of  skin    Time spent: 35 minutes    Barton Dubois  [facility] Pager [phone]. If 7PM-7AM, please contact night-coverage at [email or web address], password [facility] 07/18/2017, 7:20 PM  LOS: 1 day

## 2017-07-18 NOTE — Progress Notes (Signed)
Pt has wound in healing stages of right elbow, pt states it is not new. Placed foam dressing over for pt comfort. No drainage. Round knot protruding above healing wound.

## 2017-07-18 NOTE — Progress Notes (Signed)
Pt transferred to air mattress.

## 2017-07-18 NOTE — Consult Note (Signed)
WOC Nurse has reviewed record and this patient has a positive xray or MRI for osteomyelitis, this is considered outside of the scope of practice for the WOC nurse, for that reason WOC Nurse will not consult.  ? ?Re-consult if only topical wound care needed after orthopedic or surgical evaluation ?Yurem Viner MSN, RN, CWOCN, CNS, CWON-AP ?[phone]   ?

## 2017-07-18 NOTE — Progress Notes (Signed)
Initial Nutrition Assessment  DOCUMENTATION CODES:   Not applicable  INTERVENTION:   Ensure Enlive po BID, each supplement provides 350 kcal and 20 grams of protein   NUTRITION DIAGNOSIS:   Increased nutrient needs related to acute illness, wound healing as evidenced by estimated needs.  GOAL:   Patient will meet greater than or equal to 90% of their needs   MONITOR:   PO intake, Supplement acceptance, Labs, Weight trends  REASON FOR ASSESSMENT:   Malnutrition Screening Tool    ASSESSMENT:   76 yo male admitted with sepsis due to osteomyelitis and UTI.  Pt wit hx of HTN, HLD, COPD, GERD, CHF EF 30-35%, CAD  Noted plan for possible amputation Recorded po intake 100% of meals; pt reports appetite is good.   Pt is unsure if he has lost or gained any weight recently. Per weight encounters, no recent wt loss  Nutrition-Focused physical exam completed. Findings are no fat depletion, mild/moderate to severe muscle depletion, and mild edema.   Pt does not meet clinical characteristics based on information available at present but is at risk for malnutrition  Labs: sodium 133 Meds: NS at 50 ml/hr, MVI  Diet Order:  Diet Heart Room service appropriate? Yes; Fluid consistency: Thin  Skin:  Wound (see comment) (osteomyelitis, unstageable toe, stage II sacrum)  Last BM:  9/9  Height:   Ht Readings from Last 1 Encounters:  07/17/17 5\' 11"  (1.803 m)    Weight:   Wt Readings from Last 1 Encounters:  07/18/17 155 lb 1.6 oz (70.4 kg)    Ideal Body Weight:     BMI:  Body mass index is 21.63 kg/m.  Estimated Nutritional Needs:   Kcal:  1800-2000 kcals  Protein:  105-140 g  Fluid:  >/= 1.8 L  EDUCATION NEEDS:   No education needs identified at this time  [place], [place], LDN [phone] Pager  [phone] Weekend/On-Call Pager

## 2017-07-18 NOTE — Consult Note (Signed)
ORTHOPAEDIC CONSULTATION  REQUESTING PHYSICIAN: Barton Dubois, MD  Chief Complaint: Right foot pain  HPI: Vincent Dunlap is a 76 y.o. male who presents with peripheral vascular disease with chronic history of smoking three quarters pack per day with global right foot pain and over a one-year history of black gangrenous changes to the second toe.  Past Medical History:  Diagnosis Date  . CAD (coronary artery disease) 07/2016   3 vessel disease,  evaluated by Dr Servando Snare and felt to be a poor surgical candidate  . Hypertension   . Ischemic cardiomyopathy 2017   EF 30%  . Tobacco abuse    Past Surgical History:  Procedure Laterality Date  . CARDIAC CATHETERIZATION N/A 07/19/2016   Procedure: Right/Left Heart Cath and Coronary Angiography;  Surgeon: Troy Sine, MD;  Location: [facility];  Service: Cardiovascular;  Laterality: N/A;  . INGUINAL HERNIA REPAIR Bilateral    Social History   Social History  . Marital status: Single    Spouse name: N/A  . Number of children: N/A  . Years of education: N/A   Social History Main Topics  . Smoking status: Current Every Day Smoker  . Smokeless tobacco: Former Systems developer     Comment: 1 pack per day ages [age]  . Alcohol use No     Comment: Former heavy binge drinker until 2014  . Drug use: No  . Sexual activity: Not Asked   Other Topics Concern  . None   Social History Narrative  . None   Family History  Problem Relation Age of Onset  . Alzheimer's disease Mother   . Diabetes Mother        Diabetes in the maternal side of the family but not in mother  . Heart disease Neg Hx   . Cancer Neg Hx    - negative except otherwise stated in the family history section Allergies  Allergen Reactions  . Chantix [Varenicline] Other (See Comments)    Pt states it made him crazy   Prior to Admission medications   Medication Sig Start Date End Date Taking? Authorizing Provider  amLODipine-benazepril (LOTREL) 5-20 MG capsule  Take 1 capsule by mouth daily.   Yes [provider]  atorvastatin (LIPITOR) 80 MG tablet Take 1 tablet (80 mg total) by mouth daily at 6 PM. 07/25/16  Yes Regalado, Belkys A, MD  carvedilol (COREG) 6.25 MG tablet Take 1 tablet (6.25 mg total) by mouth 2 (two) times daily with a meal. 07/25/16  Yes Regalado, Belkys A, MD  lisinopril (PRINIVIL,ZESTRIL) 2.5 MG tablet Take 1 tablet (2.5 mg total) by mouth daily. 07/26/16  Yes Regalado, Belkys A, MD  nitroGLYCERIN (NITROSTAT) 0.4 MG SL tablet Place 1 tablet (0.4 mg total) under the tongue every 5 (five) minutes as needed for chest pain. 07/25/16  Yes Regalado, Belkys A, MD  spironolactone (ALDACTONE) 25 MG tablet Take 0.5 tablets (12.5 mg total) by mouth daily. 07/26/16  Yes Regalado, Cassie Freer, MD   Dg Chest 2 View  Result Date: 07/16/2017 CLINICAL DATA:  Dizziness and hypotension. History of hypertension. Former smoker. EXAM: CHEST  2 VIEW COMPARISON:  07/18/2016 FINDINGS: Shallow inspiration. Heart size and pulmonary vascularity are normal. Slight interstitial pattern to the lung bases likely representing fibrosis. Mild bronchiectasis. No airspace disease or consolidation. No blunting of costophrenic angles. No pneumothorax. Calcification of the aorta. Previous resection or resorption of the distal left clavicle. Old left rib fractures. 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  No evidence of active pulmonary disease. Aortic atherosclerosis. Electronically Signed   By: Lucienne Capers M.D.   On: 07/16/2017 23:41   Dg Foot 2 Views Right  Result Date: 07/16/2017 CLINICAL DATA:  Necrotic second toe for 10 months. Right foot pain. Dizziness and hypotension. History of hypertension. Former smoker. EXAM: RIGHT FOOT - 2 VIEW COMPARISON:  None. FINDINGS: Diffuse bone demineralization. Soft tissue defect at the tip of the right second toe with underlying irregularity of the distal phalangeal tuft likely representing osteomyelitis. No evidence of  acute fracture or dislocation. Multiple exostoses demonstrated. Vascular calcifications. Soft tissues are otherwise unremarkable. IMPRESSION: Soft tissue defect at the tip of the right second toe with underlying bone erosion of the distal phalangeal tuft likely representing osteomyelitis. Diffuse bone demineralization. Multiple bony exostoses. Electronically Signed   By: Lucienne Capers M.D.   On: 07/16/2017 23:40   - pertinent xrays, CT, MRI studies were reviewed and independently interpreted  Positive ROS: All other systems have been reviewed and were otherwise negative with the exception of those mentioned in the HPI and as above.  Physical Exam: General: Alert, no acute distress Psychiatric: Patient is competent for consent with normal mood and affect Lymphatic: No axillary or cervical lymphadenopathy Cardiovascular: No pedal edema Respiratory: No cyanosis, no use of accessory musculature GI: No organomegaly, abdomen is soft and non-tender  Skin: Skin is thin and atrophic.   Neurologic: Patient does have protective sensation bilateral lower extremities.   MUSCULOSKELETAL:  Examination patient has a faint dorsalis pedis pulse bilaterally. His skin is tender to palpation. There is no ascending cellulitis but he does have dry black gangrenous changes right foot second toe with exposed bone.  Assessment: Assessment: Peripheral vascular disease with current smoker with dry gangrene of the right foot second toe.  Plan: Patient will need ankle-brachial indices anticipate patient will need arteriogram study. He is currently established with cardiology and most likely they could proceed with vascular studies. I will follow-up for surgery, possible right foot second Ray amputation, once vascular status is improved. Patient does not need MRI scan of the right foot.  Thank you for the consult and the opportunity to see [name], MD [employer] [phone] 7:12  AM

## 2017-07-18 NOTE — Progress Notes (Signed)
Progress Note  Patient Name: Vincent Dunlap Date of Encounter: 07/18/2017  Primary Cardiologist: Dr. Acie Fredrickson  Subjective   Patient is feeling well; denies chest pain, SOB, and palpitations.   Inpatient Medications    Scheduled Meds: . aspirin  81 mg Oral Daily  . atorvastatin  80 mg Oral q1800  . carvedilol  6.25 mg Oral BID WC  . isosorbide mononitrate  30 mg Oral Q24H  . multivitamin with minerals  1 tablet Oral Daily  . nicotine  14 mg Transdermal Daily   Continuous Infusions: . sodium chloride 50 mL/hr at 07/17/17 0335  . piperacillin-tazobactam (ZOSYN)  IV Stopped (07/18/17 0939)  . vancomycin Stopped (07/18/17 1020)   PRN Meds: albuterol, hydrALAZINE, morphine injection, nitroGLYCERIN, ondansetron **OR** ondansetron (ZOFRAN) IV, oxyCODONE-acetaminophen, zolpidem   Vital Signs    Vitals:   07/17/17 2000 07/17/17 2352 07/18/17 0447 07/18/17 0800  BP: (!) 105/49 (!) 97/54 118/60 (!) 117/56  Pulse: 79 68 83 81  Resp:  18 18 14   Temp:  97.7 F (36.5 C) (!) 97.5 F (36.4 C) (!) 97.3 F (36.3 C)  TempSrc:  Oral Oral Oral  SpO2:  100% 100% 98%  Weight:   155 lb 1.6 oz (70.4 kg)   Height:        Intake/Output Summary (Last 24 hours) at 07/18/17 1056 Last data filed at 07/18/17 1004  Gross per 24 hour  Intake             2410 ml  Output             1701 ml  Net              709 ml   Filed Weights   07/16/17 2108 07/17/17 0215 07/18/17 0447  Weight: 150 lb (68 kg) 156 lb 3.2 oz (70.9 kg) 155 lb 1.6 oz (70.4 kg)     Physical Exam   General: Well developed, well nourished, male appearing in no acute distress. Head: Normocephalic, atraumatic.  Neck: Supple without bruits, no JVD. Lungs:  Resp regular and unlabored, CTA. Heart: RRR, S1, S2, no S3, S4, or murmur; no rub. Abdomen: Soft, non-tender, non-distended with normoactive bowel sounds. No hepatomegaly. No rebound/guarding. No obvious abdominal masses. Extremities: No clubbing, cyanosis, trace edema.  Left second toe wrapped, pulses faint Neuro: Alert and oriented X 3. Moves all extremities spontaneously. Psych: Normal affect.  Labs    Chemistry Recent Labs Lab 07/16/17 2114 07/17/17 0643  NA 130* 133*  K 3.7 3.6  CL 98* 103  CO2 18* 24  GLUCOSE 100* 106*  BUN 22* 13  CREATININE 0.71 0.54*  CALCIUM 9.1 8.6*  GFRNONAA >60 >60  GFRAA >60 >60  ANIONGAP 14 6     Hematology Recent Labs Lab 07/16/17 2114 07/17/17 0643  WBC 14.6* 11.1*  RBC 4.19* 3.96*  HGB 10.6* 9.9*  HCT 32.2* 30.4*  MCV 76.8* 76.8*  MCH 25.3* 25.0*  MCHC 32.9 32.6  RDW 17.6* 17.5*  PLT 100* 56*    Cardiac Enzymes Recent Labs Lab 07/17/17 0317 07/17/17 0643 07/17/17 1339  TROPONINI <0.03 <0.03 <0.03    Recent Labs Lab 07/16/17 2346  TROPIPOC 0.01     BNP Recent Labs Lab 07/17/17 0317  BNP 145.4*     DDimer No results for input(s): DDIMER in the last 168 hours.   Radiology    Dg Chest 2 View  Result Date: 07/16/2017 CLINICAL DATA:  Dizziness and hypotension. History of hypertension. Former smoker. EXAM: CHEST  2 VIEW COMPARISON:  07/18/2016 FINDINGS: Shallow inspiration. Heart size and pulmonary vascularity are normal. Slight interstitial pattern to the lung bases likely representing fibrosis. Mild bronchiectasis. No airspace disease or consolidation. No blunting of costophrenic angles. No pneumothorax. Calcification of the aorta. Previous resection or resorption of the distal left clavicle. Old left rib fractures. 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 No evidence of active pulmonary disease. Aortic atherosclerosis. Electronically Signed   By: Lucienne Capers M.D.   On: 07/16/2017 23:41   Dg Foot 2 Views Right  Result Date: 07/16/2017 CLINICAL DATA:  Necrotic second toe for 10 months. Right foot pain. Dizziness and hypotension. History of hypertension. Former smoker. EXAM: RIGHT FOOT - 2 VIEW COMPARISON:  None. FINDINGS: Diffuse bone demineralization. Soft tissue  defect at the tip of the right second toe with underlying irregularity of the distal phalangeal tuft likely representing osteomyelitis. No evidence of acute fracture or dislocation. Multiple exostoses demonstrated. Vascular calcifications. Soft tissues are otherwise unremarkable. IMPRESSION: Soft tissue defect at the tip of the right second toe with underlying bone erosion of the distal phalangeal tuft likely representing osteomyelitis. Diffuse bone demineralization. Multiple bony exostoses. Electronically Signed   By: Lucienne Capers M.D.   On: 07/16/2017 23:40     Telemetry    NSR - Personally Reviewed  ECG    No new tracings - Personally Reviewed   Cardiac Studies    Echocardiogram 07/17/17: Study Conclusions - Procedure narrative: Transthoracic echocardiography. Image   quality was suboptimal. The study was technically difficult, as a   result of poor acoustic windows, poor sound wave transmission,   and body habitus. - Left ventricle: The cavity size was normal. Systolic function was   normal. The estimated ejection fraction was 55%. Doppler   parameters are consistent with abnormal left ventricular   relaxation (grade 1 diastolic dysfunction). - Regional wall motion abnormality: Hypokinesis of the basal   inferior myocardium. - Aortic valve: Mildly thickened, mildly calcified leaflets. There   was mild regurgitation. - Mitral valve: Mildly thickened leaflets . There was mild to   moderate regurgitation. - Right ventricle: Systolic function was mildly reduced. - Tricuspid valve: There was mild regurgitation.   Left heart cath 07/19/16:  Colon Flattery RCA lesion, 95 %stenosed.  Prox RCA lesion, 100 %stenosed.  Ost LM to LM lesion, 70 %stenosed.  Ost Cx to Prox Cx lesion, 75 %stenosed.  Ost LAD to Prox LAD lesion, 70 %stenosed.  Ost 1st Diag to 1st Diag lesion, 70 %stenosed.  Ost 2nd Diag to 2nd Diag lesion, 70 %stenosed.  Ost 1st Sept to 1st Sept lesion, 90  %stenosed.  Hemodynamic findings consistent with mild pulmonary hypertension.   Severe ischemic cardiomyopathy with diffuse hypocontractility and hypo-to akinesis in the mid- basal inferior wall with a global ejection fraction of of 25-30%.  Severe multivessel CAD with 70% distal left main stenosis, 70% proximal LAD, diagonal 1 and diagonal 2 stenoses with 90% septal perforating artery stenoses; 75% proximal left circumflex stenoses, and total occlusion of the very proximal RCA with significant left to right collaterals.  RECOMMENDATION: Surgical consultation for CABG revascularization surgery.   Echocardiogram 07/17/16: Study Conclusions - Left ventricle: The cavity size was normal. Wall thickness was   increased in a pattern of mild LVH. Systolic function was   moderately to severely reduced. The estimated ejection fraction   was in the range of 30% to 35%. Akinesis of the   mid-apicalanteroseptal myocardium. There was fusion of early and   atrial  contributions to ventricular filling. - Right atrium: The atrium was mildly dilated. - Pulmonary arteries: Systolic pressure was mildly increased. PA   peak pressure: 37 mm Hg (S).   Patient Profile     76 y.o. male with a h/o known 3V CAD and EF 30% who presented with "low blood pressure" and toe pain.   Assessment & Plan    1. CAD - known three vessel disease, not a PCI or CABG candidate given renal function and minimal symptoms - medical management   2. Hx of chronic systolic heart failure, ischemic cardiomyopathy - now improved - continue home meds ASA, lipitor, coreg, imdur - now improved to 55% by echo yesterday - lisinopril on hold for hypotension   3. Necrotic toe - per primary team   4. Hypotension - likely secondary to infectious process - hold ACEI, continue other meds as pressure tolerates    Signed, Ledora Bottcher , PA-C 10:56 AM 07/18/2017 Pager: [phone]   I have seen and examined the  patient along with Ledora Bottcher , PA.  I have reviewed the chart, notes and new data.  I agree with PA's note.  Key new complaints: Denies angina or dyspnea Key examination changes: No longer hypotensive, ace inhibitors on hold Key new findings / data: Repeat echo shows left ventricular systolic function has improved substantially  PLAN: Continue to hold ACE inhibitor until blood pressure fully recovers, reintroduce gradually as infection comes under control  Sanda Klein, MD, [facility] [phone] 07/18/2017, 12:10 PM

## 2017-07-19 ENCOUNTER — Inpatient Hospital Stay (HOSPITAL_COMMUNITY): Payer: Medicare Other

## 2017-07-19 DIAGNOSIS — I739 Peripheral vascular disease, unspecified: Secondary | ICD-10-CM

## 2017-07-19 LAB — BASIC METABOLIC PANEL
Anion gap: 6 (ref 5–15)
BUN: 7 mg/dL (ref 6–20)
CO2: 24 mmol/L (ref 22–32)
Calcium: 8.2 mg/dL — ABNORMAL LOW (ref 8.9–10.3)
Chloride: 101 mmol/L (ref 101–111)
Creatinine, Ser: 0.56 mg/dL — ABNORMAL LOW (ref 0.61–1.24)
GFR calc non Af Amer: >60 mL/min (ref >60)
GLUCOSE: 93 mg/dL (ref 65–99)
Potassium: 3.4 mmol/L — ABNORMAL LOW (ref 3.5–5.1)
Sodium: 131 mmol/L — ABNORMAL LOW (ref 135–145)

## 2017-07-19 LAB — GLUCOSE, CAPILLARY: GLUCOSE-CAPILLARY: 109 mg/dL — AB (ref 65–99)

## 2017-07-19 MED ORDER — POTASSIUM CHLORIDE CRYS ER 20 MEQ PO TBCR
40.0000 meq | EXTENDED_RELEASE_TABLET | ORAL | Status: AC
  Administered 2017-07-20: 40 meq via ORAL
  Filled 2017-07-19: qty 2

## 2017-07-19 NOTE — Progress Notes (Addendum)
[facility] PROGRESS NOTE  Vincent Dunlap FIE:[id number] DOB: 06/13/1941 DOA: 07/16/2017 PCP: Thressa Sheller, MD  Interim summary and history of present illness 76 y.o. male with medical history significant of hypertension, hyperlipidemia, COPD, GERD, depression, formal smoker, sCHF with EF of 30-35%, CAD (multivessel disease, but not a candidate for CABG), chronic thrombocytopenia, who presents with foot pain, chest pain, and dysuria. Patient found to have sepsis most likely due to UTI and osteomyelitis on his right second toe.  Assessment/Plan: 1-sepsis secondary to osteomyelitis and UTI -Patient responded well to IV fluid resuscitation and the use of broad-spectrum antibiotics -overall hemodynamically stable at this time -Will follow results from blood cultures and urine culture -Continue vanc and zosyn -Assessment of vasculature integrity on his right leg with ABI pending -orthopedic service on board and will follow their rec's   2-intermittent chest discomfort in patient with history of coronary artery disease: Patient had a history of known coronary artery disease (multiple vessels) found not a candidate for CABG. -He has been experiencing stable angina for years -Continue conservative management with the use of aspirin, statins, beta blocker and imdur -Cardiology has seen patient and recommended no further ischemic workup at this moment. -neg troponin X3; no CP  3-history of COPD -No wheezing and with good air movement bilaterally  -Will continue as needed albuterol nebulizer -cessation counseling provided   4-chronic idiopathic thrombocytopenia -No signs of acute bleeding -Will continue monitoring platelets trend intermittently   5-tobacco abuse -Cessation counseling provided -Nicotine patch has been ordered  6-hypertension -BP soft and low especially when changing positions -following cardiology rec's will continue holding lisinopril -continue rest of  antihypertensive drugs   7-hyperlipidemia -will continue lipitor   8-stage 2 sacral pressure injury -continue preventive measures -overlay mattress ordered   9-mild hyponatremia: from dehydration  -will continue gentle IVF's -patient able to eat and drink now -will monitor electrolytes intermittently    Code Status: Full code Family Communication: None at bedside Disposition Plan: To be determined. Will follow right lower extremity ABI to assess vascular integrity. Orthopedic surgery has been consulted for evaluation and to help determine salvage interventions versus amputation. Continue antibiotics.  Consultants:  Neurology service  Orthopedic service  Procedures:  See below for x-ray reports   2-D echo - Procedure narrative: Transthoracic echocardiography. Image   quality was suboptimal. The study was technically difficult, as a   result of poor acoustic windows, poor sound wave transmission,   and body habitus. - Left ventricle: The cavity size was normal. Systolic function was   normal. The estimated ejection fraction was 55%. Doppler   parameters are consistent with abnormal left ventricular   relaxation (grade 1 diastolic dysfunction). - Regional wall motion abnormality: Hypokinesis of the basal   inferior myocardium. - Aortic valve: Mildly thickened, mildly calcified leaflets. There   was mild regurgitation. - Mitral valve: Mildly thickened leaflets . There was mild to   moderate regurgitation. - Right ventricle: Systolic function was mildly reduced. - Tricuspid valve: There was mild regurgitation.   Right lower extremity ABI  Antibiotics:  Vancomycin and Zosyn 9/8  HPI/Subjective: Afebrile, no CP, no nausea, no vomiting, no SOB. Continue experiencing intermittent right foot pain. Patient also reported mild dizziness when sitting up.  Objective: Vitals:   07/19/17 0537 07/19/17 1518  BP: 110/71 100/61  Pulse: 80 79  Resp: 18 18  Temp: 97.9 F  (36.6 C) 98 F (36.7 C)  SpO2: 98% 98%    Intake/Output Summary (Last 24 hours) at  07/19/17 1814 Last data filed at 07/19/17 1500  Gross per 24 hour  Intake          1981.67 ml  Output             2120 ml  Net          -138.33 ml   Filed Weights   07/17/17 0215 07/18/17 0447 07/19/17 0537  Weight: 70.9 kg (156 lb 3.2 oz) 70.4 kg (155 lb 1.6 oz) 70.9 kg (156 lb 4.9 oz)    Exam:   General:  Afebrile, no CP, no nausea, no vomiting. Patient continue having some intermittent right foot pain. Patient also reported some dizziness while sitting up.  Cardiovascular: S1 and S2, no rubs, no gallops, no JVD.  Respiratory: normal resp effort, no wheezing, no crackles   Abdomen: soft, NT, ND, positive BS  Musculoskeletal: trace edema bilaterally; mild erythematous changes in dorsal aspect of his foot, right second toe with necrotic tip. Patient decrease pedal and popliteal on RLE.   Skin: stage 2 sacral pressure injury, no superimposed infection appreciated.  Data Reviewed: Basic Metabolic Panel:  Recent Labs Lab 07/16/17 2114 07/17/17 0643 07/19/17 0422  NA 130* 133* 131*  K 3.7 3.6 3.4*  CL 98* 103 101  CO2 18* 24 24  GLUCOSE 100* 106* 93  BUN 22* 13 7  CREATININE 0.71 0.54* 0.56*  CALCIUM 9.1 8.6* 8.2*   CBC:  Recent Labs Lab 07/16/17 2114 07/17/17 0643  WBC 14.6* 11.1*  HGB 10.6* 9.9*  HCT 32.2* 30.4*  MCV 76.8* 76.8*  PLT 100* 56*   Cardiac Enzymes:  Recent Labs Lab 07/17/17 0317 07/17/17 0643 07/17/17 1339  TROPONINI <0.03 <0.03 <0.03   BNP (last 3 results)  Recent Labs  07/17/17 0317  BNP 145.4*   CBG:  Recent Labs Lab 07/17/17 0738 07/18/17 0539 07/19/17 0605  GLUCAP 109* 109* 109*   Studies: No results found.  Scheduled Meds: . aspirin  81 mg Oral Daily  . atorvastatin  80 mg Oral q1800  . carvedilol  6.25 mg Oral BID WC  . feeding supplement (ENSURE ENLIVE)  237 mL Oral BID BM  . isosorbide mononitrate  30 mg Oral Q24H  .  multivitamin with minerals  1 tablet Oral Daily  . nicotine  14 mg Transdermal Daily   Continuous Infusions: . sodium chloride 50 mL/hr at 07/17/17 0335  . piperacillin-tazobactam (ZOSYN)  IV Stopped (07/19/17 1801)  . vancomycin Stopped (07/19/17 1110)    Principal Problem:   Osteomyelitis of toe of right foot ([place]) Active Problems:   CAD (coronary artery disease), native coronary artery   Chronic systolic CHF (congestive heart failure) ([facility])   COPD (chronic obstructive pulmonary disease) ([facility])   Chronic idiopathic thrombocytopenia ([facility])   UTI (urinary tract infection)   Sepsis ([facility])   Hypotension   Chest pain   Pressure injury of skin    Time spent: 30 minutes    Barton Dubois  [facility] Pager [phone]. If 7PM-7AM, please contact night-coverage at [email or web address], password [facility] 07/19/2017, 6:14 PM  LOS: 2 days

## 2017-07-19 NOTE — Progress Notes (Signed)
Progress Note  Patient Name: Vincent Dunlap Date of Encounter: 07/19/2017  Primary Cardiologist: Dr. Acie Fredrickson  Subjective   Pt is sitting up in bed and states he is dizzy. Also states the dizziness is coming from his chest and that this has been going on for 2 years.  Inpatient Medications    Scheduled Meds: . aspirin  81 mg Oral Daily  . atorvastatin  80 mg Oral q1800  . carvedilol  6.25 mg Oral BID WC  . feeding supplement (ENSURE ENLIVE)  237 mL Oral BID BM  . isosorbide mononitrate  30 mg Oral Q24H  . multivitamin with minerals  1 tablet Oral Daily  . nicotine  14 mg Transdermal Daily   Continuous Infusions: . sodium chloride 50 mL/hr at 07/17/17 0335  . piperacillin-tazobactam (ZOSYN)  IV Stopped (07/19/17 1000)  . vancomycin 750 mg (07/19/17 1010)   PRN Meds: albuterol, hydrALAZINE, morphine injection, nitroGLYCERIN, ondansetron **OR** ondansetron (ZOFRAN) IV, oxyCODONE-acetaminophen, zolpidem   Vital Signs    Vitals:   07/18/17 0800 07/18/17 1115 07/18/17 2003 07/19/17 0537  BP: (!) 117/56 (!) 97/47 (!) 90/51 110/71  Pulse: 81 78 80 80  Resp: 14  18 18   Temp: (!) 97.3 F (36.3 C) 97.6 F (36.4 C) 98 F (36.7 C) 97.9 F (36.6 C)  TempSrc: Oral Oral Oral Oral  SpO2: 98% 96% 99% 98%  Weight:    156 lb 4.9 oz (70.9 kg)  Height:        Intake/Output Summary (Last 24 hours) at 07/19/17 1056 Last data filed at 07/19/17 0251  Gross per 24 hour  Intake          1598.33 ml  Output             1920 ml  Net          -321.67 ml   Filed Weights   07/17/17 0215 07/18/17 0447 07/19/17 0537  Weight: 156 lb 3.2 oz (70.9 kg) 155 lb 1.6 oz (70.4 kg) 156 lb 4.9 oz (70.9 kg)     Physical Exam   General: Well developed, well nourished, male appearing in no acute distress. Head: Normocephalic, atraumatic.  Neck: Supple without bruits, no JVD Lungs:  Resp regular and unlabored, CTA. Heart: RRR, S1, S2, no S3, S4, or murmur; no rub. Abdomen: Soft, non-tender,  non-distended with normoactive bowel sounds. No hepatomegaly. No rebound/guarding. No obvious abdominal masses. Extremities: No clubbing, cyanosis, no edema. Distal pedal pulses are 1+ bilaterally. Right second toe wrapped Neuro: Alert and oriented X 3. Moves all extremities spontaneously. Psych: Normal affect.  Labs    Chemistry Recent Labs Lab 07/16/17 2114 07/17/17 0643 07/19/17 0422  NA 130* 133* 131*  K 3.7 3.6 3.4*  CL 98* 103 101  CO2 18* 24 24  GLUCOSE 100* 106* 93  BUN 22* 13 7  CREATININE 0.71 0.54* 0.56*  CALCIUM 9.1 8.6* 8.2*  GFRNONAA >60 >60 >60  GFRAA >60 >60 >60  ANIONGAP 14 6 6      Hematology Recent Labs Lab 07/16/17 2114 07/17/17 0643  WBC 14.6* 11.1*  RBC 4.19* 3.96*  HGB 10.6* 9.9*  HCT 32.2* 30.4*  MCV 76.8* 76.8*  MCH 25.3* 25.0*  MCHC 32.9 32.6  RDW 17.6* 17.5*  PLT 100* 56*    Cardiac Enzymes Recent Labs Lab 07/17/17 0317 07/17/17 0643 07/17/17 1339  TROPONINI <0.03 <0.03 <0.03    Recent Labs Lab 07/16/17 2346  TROPIPOC 0.01     BNP Recent Labs Lab 07/17/17  3532  BNP 145.4*     DDimer No results for input(s): DDIMER in the last 168 hours.   Radiology    No results found.   Telemetry    NSR - Personally Reviewed  ECG    No new tracings - Personally Reviewed   Cardiac Studies   Echocardiogram 07/17/17: Study Conclusions - Procedure narrative: Transthoracic echocardiography. Image quality was suboptimal. The study was technically difficult, as a result of poor acoustic windows, poor sound wave transmission, and body habitus. - Left ventricle: The cavity size was normal. Systolic function was normal. The estimated ejection fraction was 55%. Doppler parameters are consistent with abnormal left ventricular relaxation (grade 1 diastolic dysfunction). - Regional wall motion abnormality: Hypokinesis of the basal inferior myocardium. - Aortic valve: Mildly thickened, mildly calcified leaflets.  There was mild regurgitation. - Mitral valve: Mildly thickened leaflets . There was mild to moderate regurgitation. - Right ventricle: Systolic function was mildly reduced. - Tricuspid valve: There was mild regurgitation.   Left heart cath 07/19/16:  Colon Flattery RCA lesion, 95 %stenosed.  Prox RCA lesion, 100 %stenosed.  Ost LM to LM lesion, 70 %stenosed.  Ost Cx to Prox Cx lesion, 75 %stenosed.  Ost LAD to Prox LAD lesion, 70 %stenosed.  Ost 1st Diag to 1st Diag lesion, 70 %stenosed.  Ost 2nd Diag to 2nd Diag lesion, 70 %stenosed.  Ost 1st Sept to 1st Sept lesion, 90 %stenosed.  Hemodynamic findings consistent with mild pulmonary hypertension.  Severe ischemic cardiomyopathy with diffuse hypocontractility and hypo-to akinesis in the mid- basal inferior wall with a global ejection fraction of of 25-30%.  Severe multivessel CAD with 70% distal left main stenosis, 70% proximal LAD, diagonal 1 and diagonal 2 stenoses with 90% septal perforating artery stenoses; 75% proximal left circumflex stenoses, and total occlusion of the very proximal RCA with significant left to right collaterals.  RECOMMENDATION: Surgical consultation for CABG revascularization surgery.   Echocardiogram 07/17/16: Study Conclusions - Left ventricle: The cavity size was normal. Wall thickness was increased in a pattern of mild LVH. Systolic function was moderately to severely reduced. The estimated ejection fraction was in the range of 30% to 35%. Akinesis of the mid-apicalanteroseptal myocardium. There was fusion of early and atrial contributions to ventricular filling. - Right atrium: The atrium was mildly dilated. - Pulmonary arteries: Systolic pressure was mildly increased. PA peak pressure: 37 mm Hg (S).  Patient Profile     76 y.o. male with a h/o known 3V CAD and EF 30% who presented with "low blood pressure" and toe pain.  Assessment & Plan    1. CAD - known three vessel  disease, not a PCI or CABG candidate given renal function and minimal symptoms - medical management  2. Hx of chronic systolic heart failure, ischemic cardiomyopathy - now improved - continue ASA, lipitor, coreg, imdur - holding lisinopril for hypotension  3. Hypotension - pressure improved this morning with sBP 110 - continue to hold lisinopril today to trend pressures - consider adding back tomorrow if he is feeling well and stable pressure  4. Necrotic toe - per primary team  Signed, Ledora Bottcher , PA-C 10:56 AM 07/19/2017 Pager: [phone]  I have seen and examined the patient along with Fabian Sharp, PA.  I have reviewed the chart, notes and new data.  I agree with PA/NP's note.  Key new complaints: Mild dizziness Key examination changes: Borderline hypotension persists   PLAN: Continue to hold ACE inhibitor.  Sanda Klein, MD, [facility] [phone] 07/19/2017, 12:41 PM

## 2017-07-19 NOTE — Progress Notes (Signed)
VASCULAR LAB PRELIMINARY  ARTERIAL  ABI completed:    RIGHT    LEFT    PRESSURE WAVEFORM  PRESSURE WAVEFORM  BRACHIAL 101 Triphasic BRACHIAL       DP 60 Severely Dampened Monophasic  AT 40 Severely Dampened Monophasic     PT 40 Dampened Monophasic PT 42 Dampened Monophasic    RIGHT LEFT  ABI 0.40 0.59   Technically difficult due to cardiac arrhythmia.  ABI on the right indicates a severe reduction in arterial flow at rest. Left ABI indicates a moderate reduction in arterial flow at rest however Doppler waveform suggest a possible elevation of pressure.  Vincent Dunlap, RVS 07/19/2017, 11:55 AM

## 2017-07-20 DIAGNOSIS — J449 Chronic obstructive pulmonary disease, unspecified: Secondary | ICD-10-CM

## 2017-07-20 DIAGNOSIS — R079 Chest pain, unspecified: Secondary | ICD-10-CM

## 2017-07-20 DIAGNOSIS — D693 Immune thrombocytopenic purpura: Secondary | ICD-10-CM

## 2017-07-20 LAB — CBC
HEMATOCRIT: 29.3 % — AB (ref 39.0–52.0)
HEMATOCRIT: 29.7 % — AB (ref 39.0–52.0)
HEMOGLOBIN: 9.8 g/dL — AB (ref 13.0–17.0)
Hemoglobin: 9.9 g/dL — ABNORMAL LOW (ref 13.0–17.0)
MCH: 25.5 pg — AB (ref 26.0–34.0)
MCH: 25.6 pg — ABNORMAL LOW (ref 26.0–34.0)
MCHC: 33.4 g/dL (ref 30.0–36.0)
MCHC: 33.3 g/dL (ref 30.0–36.0)
MCV: 76.1 fL — ABNORMAL LOW (ref 78.0–100.0)
MCV: 76.7 fL — AB (ref 78.0–100.0)
PLATELETS: 119 10*3/uL — AB (ref 150–400)
Platelets: 91 10*3/uL — ABNORMAL LOW (ref 150–400)
RBC: 3.85 MIL/uL — AB (ref 4.22–5.81)
RBC: 3.87 MIL/uL — AB (ref 4.22–5.81)
RDW: 18.1 % — ABNORMAL HIGH (ref 11.5–15.5)
RDW: 18.4 % — ABNORMAL HIGH (ref 11.5–15.5)
WBC: 12.8 10*3/uL — AB (ref 4.0–10.5)
WBC: 11 10*3/uL — AB (ref 4.0–10.5)

## 2017-07-20 LAB — BASIC METABOLIC PANEL
ANION GAP: 8 (ref 5–15)
BUN: 14 mg/dL (ref 6–20)
CO2: 22 mmol/L (ref 22–32)
Calcium: 8.5 mg/dL — ABNORMAL LOW (ref 8.9–10.3)
Chloride: 104 mmol/L (ref 101–111)
Creatinine, Ser: 0.98 mg/dL (ref 0.61–1.24)
Glucose, Bld: 91 mg/dL (ref 65–99)
POTASSIUM: 4 mmol/L (ref 3.5–5.1)
SODIUM: 134 mmol/L — AB (ref 135–145)

## 2017-07-20 LAB — GLUCOSE, CAPILLARY: Glucose-Capillary: 98 mg/dL (ref 65–99)

## 2017-07-20 NOTE — Progress Notes (Signed)
[facility] PROGRESS NOTE  Vincent Dunlap NLG:[id number] DOB: 12/25/1940 DOA: 07/16/2017 PCP: Thressa Sheller, MD  Interim summary and history of present illness 76 y.o. male with medical history significant of hypertension, hyperlipidemia, COPD, GERD, depression, formal smoker, sCHF with EF of 30-35%, CAD (multivessel disease, but not a candidate for CABG), chronic thrombocytopenia, who presents with foot pain, chest pain, and dysuria. Patient found to have sepsis most likely due to UTI and osteomyelitis on his right second toe.  Assessment/Plan: 1-sepsis secondary to osteomyelitis and UTI -Patient responded well to IV fluid resuscitation and the use of broad-spectrum antibiotics -overall hemodynamically stable at this time -Will follow results from blood cultures and urine culture -Continue vanc and zosyn -Assessment of vasculature integrity on his right leg with ABI severe reduction in arterial flow on right and moderate reduction on left.    -orthopedic service on board and will follow their recs   2-intermittent chest discomfort in patient with history of coronary artery disease: Patient had a history of known coronary artery disease (multiple vessels) found not a candidate for CABG. -He has been experiencing stable angina for years -Continue conservative management with the use of aspirin, statins, beta blocker and imdur -Cardiology has seen patient and recommended no further ischemic workup at this moment. -neg troponin X3; no CP  3-history of COPD -No wheezing and with good air movement bilaterally  -Will continue as needed albuterol nebulizer -cessation counseling ongoing  4-chronic idiopathic thrombocytopenia -No signs of acute bleeding -Will continue monitoring platelets trend intermittently   5-tobacco abuse -Cessation counseling provided -Nicotine patch has been ordered  6-hypertension -BP soft and low especially when changing positions -following cardiology  rec's will continue holding lisinopril -continue rest of antihypertensive drugs   7-hyperlipidemia -will continue lipitor   8-stage 2 sacral pressure injury -continue preventive measures -overlay mattress ordered   9-mild hyponatremia: from dehydration  -will continue gentle IVF's -patient able to eat and drink now -will monitor electrolytes intermittently   Code Status: Full code Family Communication: None at bedside Disposition Plan: To be determined.  Consultants:  Neurology service  Orthopedic service  Procedures:  See below for x-ray reports   2-D echo - Procedure narrative: Transthoracic echocardiography. Image   quality was suboptimal. The study was technically difficult, as a   result of poor acoustic windows, poor sound wave transmission,   and body habitus. - Left ventricle: The cavity size was normal. Systolic function was normal. The estimated ejection fraction was 55%. Doppler parameters are consistent with abnormal left ventricular   relaxation (grade 1 diastolic dysfunction). - Regional wall motion abnormality: Hypokinesis of the basal inferior myocardium. - Aortic valve: Mildly thickened, mildly calcified leaflets. There was mild regurgitation. - Mitral valve: Mildly thickened leaflets . There was mild to moderate regurgitation. - Right ventricle: Systolic function was mildly reduced. - Tricuspid valve: There was mild regurgitation.   Right lower extremity ABI  Antibiotics:  Vancomycin and Zosyn 9/8  HPI/Subjective: Pt without complaints this morning.    Objective: Vitals:   07/19/17 2018 07/20/17 0629  BP: (!) 105/48 104/62  Pulse: 77 77  Resp: 18 18  Temp: 98.4 F (36.9 C) 98.3 F (36.8 C)  SpO2: 98% 99%    Intake/Output Summary (Last 24 hours) at 07/20/17 0741 Last data filed at 07/20/17 0814  Gross per 24 hour  Intake          1981.67 ml  Output             2600  ml  Net          -618.33 ml   Filed Weights   07/18/17 0447  07/19/17 0537 07/20/17 0629  Weight: 70.4 kg (155 lb 1.6 oz) 70.9 kg (156 lb 4.9 oz) 70 kg (154 lb 6.4 oz)    Exam:   General:  Afebrile, sleepy but arousable, no CP, no nausea, no vomiting.    Cardiovascular: S1 and S2, no rubs, no gallops, no JVD.  Respiratory: normal resp effort, no wheezing, no crackles   Abdomen: soft, NT, ND, positive BS  Musculoskeletal: trace edema bilaterally; mild erythematous changes in dorsal aspect of his foot, right second toe with necrotic tip. Patient decrease pedal and popliteal on RLE.   Skin: stage 2 sacral pressure injury, no superimposed infection appreciated.  Data Reviewed: Basic Metabolic Panel:  Recent Labs Lab 07/16/17 2114 07/17/17 0643 07/19/17 0422  NA 130* 133* 131*  K 3.7 3.6 3.4*  CL 98* 103 101  CO2 18* 24 24  GLUCOSE 100* 106* 93  BUN 22* 13 7  CREATININE 0.71 0.54* 0.56*  CALCIUM 9.1 8.6* 8.2*   CBC:  Recent Labs Lab 07/16/17 2114 07/17/17 0643 07/20/17 0457  WBC 14.6* 11.1* 12.8*  HGB 10.6* 9.9* 9.8*  HCT 32.2* 30.4* 29.3*  MCV 76.8* 76.8* 76.1*  PLT 100* 56* 119*   Cardiac Enzymes:  Recent Labs Lab 07/17/17 0317 07/17/17 0643 07/17/17 1339  TROPONINI <0.03 <0.03 <0.03   BNP (last 3 results)  Recent Labs  07/17/17 0317  BNP 145.4*   CBG:  Recent Labs Lab 07/17/17 0738 07/18/17 0539 07/19/17 0605 07/20/17 0628  GLUCAP 109* 109* 109* 98   Studies: No results found.  Scheduled Meds: . aspirin  81 mg Oral Daily  . atorvastatin  80 mg Oral q1800  . carvedilol  6.25 mg Oral BID WC  . feeding supplement (ENSURE ENLIVE)  237 mL Oral BID BM  . isosorbide mononitrate  30 mg Oral Q24H  . multivitamin with minerals  1 tablet Oral Daily  . nicotine  14 mg Transdermal Daily   Continuous Infusions: . sodium chloride 50 mL/hr at 07/17/17 0335  . piperacillin-tazobactam (ZOSYN)  IV 3.375 g (07/20/17 4142)  . vancomycin Stopped (07/20/17 0143)    Principal Problem:   Osteomyelitis of toe  of right foot ([place]) Active Problems:   CAD (coronary artery disease), native coronary artery   Chronic systolic CHF (congestive heart failure) (HCC)   COPD (chronic obstructive pulmonary disease) (HCC)   Chronic idiopathic thrombocytopenia (H[facility])   UTI (urinary tract infection)   Sepsis (HCC)   Hypotension   Chest pain   Pressure injury of skin  Time spent: 30 minutes  [employer] [phone]. If 7PM-7AM, please contact night-coverage at [email or web address], password [email or web address] 07/20/2017, 7:41 AM  LOS: 3 days

## 2017-07-20 NOTE — Progress Notes (Addendum)
Pharmacy Antibiotic Note  Vincent Dunlap is a 76 y.o. male admitted on 07/16/2017 with osteomyelitis.  Pharmacy has been consulted for Vancomycin and Zosyn dosing.  Day #4 of abx for OM of toe, possible UTI. Arteriogram shows severe reduction in arterial flow at rest. May need second ray amputation after improvement of vascular status. Afeb, WBC down 12.8, LA 1.4, PCT<0.1  Plan: Continue vancomycin 750mg  IV every 12 hours. Goal trough 15-20 mcg/mL. Continue Zosyn 3.375g IV q8h (4 hour infusion) Monitor clinical picture, renal function, VT prn F/U C&S, abx deescalation / LOT  Height: 5\' 11"  (180.3 cm) Weight: 154 lb 6.4 oz (70 kg) IBW/kg (Calculated) : 75.3  Temp (24hrs), Avg:98.2 F (36.8 C), Min:98 F (36.7 C), Max:98.4 F (36.9 C)   Recent Labs Lab 07/16/17 2114 07/17/17 0049 07/17/17 0317 07/17/17 0643 07/19/17 0422 07/20/17 0457  WBC 14.6*  --   --  11.1*  --  12.8*  CREATININE 0.71  --   --  0.54* 0.56*  --   LATICACIDVEN  --  0.8 1.4  --   --   --     Estimated Creatinine Clearance: 79 mL/min (A) (by C-G formula based on SCr of 0.56 mg/dL (L)).    Allergies  Allergen Reactions  . Chantix [Varenicline] Other (See Comments)    Pt states it made him crazy   Vanc 9/9 >> Zosyn 9/9 >>  9/9 BCx: ngtd 9/9 UC: multiple species  Thank you for allowing pharmacy to be a part of this patient's care.  Elenor Quinones, PharmD, BCPS Clinical Pharmacist Pager [phone] 07/20/2017 8:20 AM

## 2017-07-20 NOTE — Progress Notes (Addendum)
Progress Note  Patient Name: Vincent Dunlap Date of Encounter: 07/20/2017  Primary Cardiologist: Dr. Acie Fredrickson  Subjective   Pt still has dizziness when he moves his head fast. He is complaining of severe bilateral foot pain. He wants to have his right foot amputated. No chest pain or shortness of breath.   Inpatient Medications    Scheduled Meds: . aspirin  81 mg Oral Daily  . atorvastatin  80 mg Oral q1800  . carvedilol  6.25 mg Oral BID WC  . feeding supplement (ENSURE ENLIVE)  237 mL Oral BID BM  . isosorbide mononitrate  30 mg Oral Q24H  . multivitamin with minerals  1 tablet Oral Daily  . nicotine  14 mg Transdermal Daily   Continuous Infusions: . sodium chloride 50 mL/hr (07/20/17 1430)  . piperacillin-tazobactam (ZOSYN)  IV 3.375 g (07/20/17 1428)  . vancomycin Stopped (07/20/17 1035)   PRN Meds: albuterol, hydrALAZINE, morphine injection, nitroGLYCERIN, ondansetron **OR** ondansetron (ZOFRAN) IV, oxyCODONE-acetaminophen, zolpidem   Vital Signs    Vitals:   07/19/17 0537 07/19/17 1518 07/19/17 2018 07/20/17 0629  BP: 110/71 100/61 (!) 105/48 104/62  Pulse: 80 79 77 77  Resp: 18 18 18 18   Temp: 97.9 F (36.6 C) 98 F (36.7 C) 98.4 F (36.9 C) 98.3 F (36.8 C)  TempSrc: Oral Oral Oral Oral  SpO2: 98% 98% 98% 99%  Weight: 156 lb 4.9 oz (70.9 kg)   154 lb 6.4 oz (70 kg)  Height:        Intake/Output Summary (Last 24 hours) at 07/20/17 1439 Last data filed at 07/20/17 0900  Gross per 24 hour  Intake          2221.67 ml  Output             1700 ml  Net           521.67 ml   Filed Weights   07/18/17 0447 07/19/17 0537 07/20/17 0629  Weight: 155 lb 1.6 oz (70.4 kg) 156 lb 4.9 oz (70.9 kg) 154 lb 6.4 oz (70 kg)     Physical Exam  Physical Exam  Constitutional: No distress. He appears chronically ill.  Neck: Normal range of motion. No JVD present.  Cardiovascular: Regular rhythm and normal heart sounds.  Exam reveals no gallop.   No murmur  heard. Pulmonary/Chest: Effort normal and breath sounds normal. He has no wheezes. He has no rales.  Abdominal: Soft. There is no tenderness.  Musculoskeletal:  Joint changes noted on hands. Bilateral feet dusky in color. Bandage on right foot.   Neurological: He is alert and oriented to person, place, and time.  Skin: Skin is warm and dry.     Labs    Chemistry  Recent Labs Lab 07/17/17 0643 07/19/17 0422 07/20/17 0743  NA 133* 131* 134*  K 3.6 3.4* 4.0  CL 103 101 104  CO2 24 24 22   GLUCOSE 106* 93 91  BUN 13 7 14   CREATININE 0.54* 0.56* 0.98  CALCIUM 8.6* 8.2* 8.5*  GFRNONAA >60 >60 >60  GFRAA >60 >60 >60  ANIONGAP 6 6 8      Hematology  Recent Labs Lab 07/17/17 0643 07/20/17 0457 07/20/17 0743  WBC 11.1* 12.8* 11.0*  RBC 3.96* 3.85* 3.87*  HGB 9.9* 9.8* 9.9*  HCT 30.4* 29.3* 29.7*  MCV 76.8* 76.1* 76.7*  MCH 25.0* 25.5* 25.6*  MCHC 32.6 33.4 33.3  RDW 17.5* 18.1* 18.4*  PLT 56* 119* 91*    Cardiac Enzymes  Recent Labs Lab 07/17/17 0317 07/17/17 0643 07/17/17 1339  TROPONINI <0.03 <0.03 <0.03     Recent Labs Lab 07/16/17 2346  TROPIPOC 0.01     BNP  Recent Labs Lab 07/17/17 0317  BNP 145.4*     DDimer No results for input(s): DDIMER in the last 168 hours.   Radiology    No results found.   Telemetry    NSR and PVC's and PAC's in the 70's and 80's - Personally Reviewed  ECG    No new tracings - Personally Reviewed   Cardiac Studies   Echocardiogram 07/17/17: Study Conclusions - Procedure narrative: Transthoracic echocardiography. Image quality was suboptimal. The study was technically difficult, as a result of poor acoustic windows, poor sound wave transmission, and body habitus. - Left ventricle: The cavity size was normal. Systolic function was normal. The estimated ejection fraction was 55%. Doppler parameters are consistent with abnormal left ventricular relaxation (grade 1 diastolic dysfunction). -  Regional wall motion abnormality: Hypokinesis of the basal inferior myocardium. - Aortic valve: Mildly thickened, mildly calcified leaflets. There was mild regurgitation. - Mitral valve: Mildly thickened leaflets . There was mild to moderate regurgitation. - Right ventricle: Systolic function was mildly reduced. - Tricuspid valve: There was mild regurgitation.   Left heart cath 07/19/16:  Colon Flattery RCA lesion, 95 %stenosed.  Prox RCA lesion, 100 %stenosed.  Ost LM to LM lesion, 70 %stenosed.  Ost Cx to Prox Cx lesion, 75 %stenosed.  Ost LAD to Prox LAD lesion, 70 %stenosed.  Ost 1st Diag to 1st Diag lesion, 70 %stenosed.  Ost 2nd Diag to 2nd Diag lesion, 70 %stenosed.  Ost 1st Sept to 1st Sept lesion, 90 %stenosed.  Hemodynamic findings consistent with mild pulmonary hypertension.  Severe ischemic cardiomyopathy with diffuse hypocontractility and hypo-to akinesis in the mid- basal inferior wall with a global ejection fraction of of 25-30%.  Severe multivessel CAD with 70% distal left main stenosis, 70% proximal LAD, diagonal 1 and diagonal 2 stenoses with 90% septal perforating artery stenoses; 75% proximal left circumflex stenoses, and total occlusion of the very proximal RCA with significant left to right collaterals.  RECOMMENDATION: Surgical consultation for CABG revascularization surgery.   Echocardiogram 07/17/16: Study Conclusions - Left ventricle: The cavity size was normal. Wall thickness was increased in a pattern of mild LVH. Systolic function was moderately to severely reduced. The estimated ejection fraction was in the range of 30% to 35%. Akinesis of the mid-apicalanteroseptal myocardium. There was fusion of early and atrial contributions to ventricular filling. - Right atrium: The atrium was mildly dilated. - Pulmonary arteries: Systolic pressure was mildly increased. PA peak pressure: 37 mm Hg (S).  Patient Profile     76 y.o. male  with a h/o known 3V CAD and EF 30% who presented with "low blood pressure" and toe pain.  Assessment & Plan    1. CAD - known three vessel disease, not a PCI or CABG candidate given renal function and minimal symptoms - medical management  2. Hx of chronic systolic heart failure, ischemic cardiomyopathy - now improved - continue ASA, lipitor, coreg, imdur - holding lisinopril for hypotension  3. Hypotension - BP stable with SBP 100-110 - Holding lisinopril for hypotension- consider adding back if he is feeling well and stable pressure. He continues to complain of dizziness  4. Necrotic toe - Pt treated for sepsis secondary to osteomyelitis. Management per primary team and ortho.   Tildon Husky , PA-C 2:39 PM 07/20/2017 Pager: [phone]  I  have seen and examined the patient along with Daune Perch , PA-C.  I have reviewed the chart, notes and new data.  I agree with PA/NP's note.  PLAN: No clear association between BP and dizziness. Continue to hold ACEi.  Sanda Klein, MD, [place] [phone] 07/20/2017, 4:55 PM

## 2017-07-21 DIAGNOSIS — L89152 Pressure ulcer of sacral region, stage 2: Secondary | ICD-10-CM

## 2017-07-21 DIAGNOSIS — I739 Peripheral vascular disease, unspecified: Secondary | ICD-10-CM

## 2017-07-21 LAB — BASIC METABOLIC PANEL
ANION GAP: 5 (ref 5–15)
BUN: 11 mg/dL (ref 6–20)
CALCIUM: 8.3 mg/dL — AB (ref 8.9–10.3)
CO2: 25 mmol/L (ref 22–32)
Chloride: 103 mmol/L (ref 101–111)
Creatinine, Ser: 1.06 mg/dL (ref 0.61–1.24)
Glucose, Bld: 94 mg/dL (ref 65–99)
POTASSIUM: 4.1 mmol/L (ref 3.5–5.1)
Sodium: 133 mmol/L — ABNORMAL LOW (ref 135–145)

## 2017-07-21 LAB — CBC
HCT: 28.3 % — ABNORMAL LOW (ref 39.0–52.0)
Hemoglobin: 9.2 g/dL — ABNORMAL LOW (ref 13.0–17.0)
MCH: 25.1 pg — ABNORMAL LOW (ref 26.0–34.0)
MCHC: 32.5 g/dL (ref 30.0–36.0)
MCV: 77.3 fL — AB (ref 78.0–100.0)
PLATELETS: 121 10*3/uL — AB (ref 150–400)
RBC: 3.66 MIL/uL — AB (ref 4.22–5.81)
RDW: 18.4 % — AB (ref 11.5–15.5)
WBC: 12.4 10*3/uL — AB (ref 4.0–10.5)

## 2017-07-21 LAB — GLUCOSE, CAPILLARY: GLUCOSE-CAPILLARY: 80 mg/dL (ref 65–99)

## 2017-07-21 LAB — VANCOMYCIN, TROUGH: Vancomycin Tr: 17 ug/mL (ref 15–20)

## 2017-07-21 MED ORDER — LISINOPRIL 2.5 MG PO TABS
2.5000 mg | ORAL_TABLET | Freq: Every day | ORAL | Status: DC
Start: 2017-07-21 — End: 2017-07-30
  Administered 2017-07-21 – 2017-07-30 (×8): 2.5 mg via ORAL
  Filled 2017-07-21 (×8): qty 1

## 2017-07-21 NOTE — Progress Notes (Signed)
[facility] PROGRESS NOTE  Vincent Dunlap DZH:[id number] DOB: [birth year] DOA: 07/16/2017 PCP: Thressa Sheller, MD  Interim summary and history of present illness 76 y.o. male with medical history significant of hypertension, hyperlipidemia, COPD, GERD, depression, formal smoker, sCHF with EF of 30-35%, CAD (multivessel disease, but not a candidate for CABG), chronic thrombocytopenia, who presents with foot pain, chest pain, and dysuria. Patient found to have sepsis most likely due to UTI and osteomyelitis on his right second toe.  Assessment/Plan: 1-sepsis secondary to osteomyelitis and UTI -Patient responded well to IV fluid resuscitation and the use of broad-spectrum antibiotics -overall hemodynamically stable at this time -Will follow results from blood cultures and urine culture -Continue vanc and zosyn -Assessment of vasculature integrity on his right leg with ABI severe reduction in arterial flow on right and moderate reduction on left.    -orthopedic service on board and will follow their recs   2-intermittent chest discomfort in patient with history of coronary artery disease: Patient had a history of known coronary artery disease (multiple vessels) found not a candidate for CABG. -He has been experiencing stable angina for years -Continue conservative management with the use of aspirin, statins, beta blocker and imdur -Cardiology has seen patient and recommended no further ischemic workup at this moment. -neg troponin X3; no CP  3-history of COPD -No wheezing and with good air movement bilaterally  -Will continue as needed albuterol nebulizer -cessation counseling ongoing  4-chronic idiopathic thrombocytopenia -No signs of acute bleeding -Will continue monitoring platelets  5-tobacco abuse -Cessation counseling provided -Nicotine patch has been ordered  6-hypertension -BP soft and low especially when changing positions -following cardiology recs, on coreg,  lisinopril, imdur  7-hyperlipidemia -will continue lipitor 80 mg daily  8-stage 2 sacral pressure injury -continue preventive measures -overlay mattress ordered   9-mild hyponatremia: from dehydration  -will continue gentle IVF's -patient able to eat and drink well now -will monitor electrolytes intermittently   Code Status: Full code Family Communication: None at bedside Disposition Plan: To be determined.  Consultants:  Neurology service  Orthopedic service  Procedures:  See below for x-ray reports   2-D echo - Procedure narrative: Transthoracic echocardiography. Image   quality was suboptimal. The study was technically difficult, as a   result of poor acoustic windows, poor sound wave transmission,   and body habitus. - Left ventricle: The cavity size was normal. Systolic function was normal. The estimated ejection fraction was 55%. Doppler parameters are consistent with abnormal left ventricular   relaxation (grade 1 diastolic dysfunction). - Regional wall motion abnormality: Hypokinesis of the basal inferior myocardium. - Aortic valve: Mildly thickened, mildly calcified leaflets. There was mild regurgitation. - Mitral valve: Mildly thickened leaflets . There was mild to moderate regurgitation. - Right ventricle: Systolic function was mildly reduced. - Tricuspid valve: There was mild regurgitation.   Right lower extremity ABI  Antibiotics:  Vancomycin and Zosyn 9/8  HPI/Subjective: Pt without complaints this morning.    Objective: Vitals:   07/21/17 0550 07/21/17 1216  BP: 120/63 (!) 102/58  Pulse: 86 75  Resp: 18 18  Temp: 97.7 F (36.5 C) 98 F (36.7 C)  SpO2: 97% 98%    Intake/Output Summary (Last 24 hours) at 07/21/17 1639 Last data filed at 07/21/17 0916  Gross per 24 hour  Intake           2607.5 ml  Output             1900 ml  Net  707.5 ml   Filed Weights   07/19/17 0537 07/20/17 0629 07/21/17 0550  Weight: 70.9 kg (156 lb  4.9 oz) 70 kg (154 lb 6.4 oz) 71.8 kg (158 lb 3.2 oz)    Exam:   General:  Afebrile, sleepy but arousable, no CP, no nausea, no vomiting.    Cardiovascular: S1 and S2, no rubs, no gallops, no JVD.  Respiratory: normal resp effort, no wheezing, no crackles   Abdomen: soft, NT, ND, positive BS  Musculoskeletal: trace edema bilaterally; mild erythematous changes in dorsal aspect of his foot, right second toe with necrotic tip. Patient decrease pedal and popliteal on RLE.   Skin: stage 2 sacral pressure injury, no superimposed infection appreciated.  Data Reviewed: Basic Metabolic Panel:  Recent Labs Lab 07/16/17 2114 07/17/17 0643 07/19/17 0422 07/20/17 0743 07/21/17 0411  NA 130* 133* 131* 134* 133*  K 3.7 3.6 3.4* 4.0 4.1  CL 98* 103 101 104 103  CO2 18* 24 24 22 25   GLUCOSE 100* 106* 93 91 94  BUN 22* 13 7 14 11   CREATININE 0.71 0.54* 0.56* 0.98 1.06  CALCIUM 9.1 8.6* 8.2* 8.5* 8.3*   CBC:  Recent Labs Lab 07/16/17 2114 07/17/17 0643 07/20/17 0457 07/20/17 0743 07/21/17 0411  WBC 14.6* 11.1* 12.8* 11.0* 12.4*  HGB 10.6* 9.9* 9.8* 9.9* 9.2*  HCT 32.2* 30.4* 29.3* 29.7* 28.3*  MCV 76.8* 76.8* 76.1* 76.7* 77.3*  PLT 100* 56* 119* 91* 121*   Cardiac Enzymes:  Recent Labs Lab 07/17/17 0317 07/17/17 0643 07/17/17 1339  TROPONINI <0.03 <0.03 <0.03   BNP (last 3 results)  Recent Labs  07/17/17 0317  BNP 145.4*   CBG:  Recent Labs Lab 07/17/17 0738 07/18/17 0539 07/19/17 0605 07/20/17 0628 07/21/17 0547  GLUCAP 109* 109* 109* 98 80   Studies: No results found.  Scheduled Meds: . aspirin  81 mg Oral Daily  . atorvastatin  80 mg Oral q1800  . carvedilol  6.25 mg Oral BID WC  . feeding supplement (ENSURE ENLIVE)  237 mL Oral BID BM  . isosorbide mononitrate  30 mg Oral Q24H  . lisinopril  2.5 mg Oral Daily  . multivitamin with minerals  1 tablet Oral Daily  . nicotine  14 mg Transdermal Daily   Continuous Infusions: . sodium chloride 50  mL/hr (07/20/17 1430)  . piperacillin-tazobactam (ZOSYN)  IV 3.375 g (07/21/17 1350)  . vancomycin Stopped (07/21/17 1208)    Principal Problem:   Osteomyelitis of toe of right foot ([place]) Active Problems:   CAD (coronary artery disease), native coronary artery   Chronic systolic CHF (congestive heart failure) (H[facility])   COPD (chronic obstructive pulmonary disease) (H[facility])   Chronic idiopathic thrombocytopenia ([facility])   UTI (urinary tract infection)   Sepsis (HCC)   Hypotension   Chest pain   Pressure injury of skin  Time spent: 26 minutes  Durell Lofaso [employer] [phone]. If 7PM-7AM, please contact night-coverage at [email or web address], password [facility] 07/21/2017, 4:39 PM  LOS: 4 days

## 2017-07-21 NOTE — Progress Notes (Signed)
Progress Note  Patient Name: Vincent Dunlap Date of Encounter: 07/21/2017  Primary Cardiologist: Nahser  Subjective   Smiling and about to finish his breakfast. Appetite has improved. Does not complain of dizziness today. Has bilateral foot pain. Does not know if he will need a couple of toes amputated or just one.  Inpatient Medications    Scheduled Meds: . aspirin  81 mg Oral Daily  . atorvastatin  80 mg Oral q1800  . carvedilol  6.25 mg Oral BID WC  . feeding supplement (ENSURE ENLIVE)  237 mL Oral BID BM  . isosorbide mononitrate  30 mg Oral Q24H  . multivitamin with minerals  1 tablet Oral Daily  . nicotine  14 mg Transdermal Daily   Continuous Infusions: . sodium chloride 50 mL/hr (07/20/17 1430)  . piperacillin-tazobactam (ZOSYN)  IV Stopped (07/21/17 0149)  . vancomycin Stopped (07/20/17 2309)   PRN Meds: albuterol, hydrALAZINE, morphine injection, nitroGLYCERIN, ondansetron **OR** ondansetron (ZOFRAN) IV, oxyCODONE-acetaminophen, zolpidem   Vital Signs    Vitals:   07/20/17 0629 07/20/17 1230 07/20/17 2132 07/21/17 0550  BP: 104/62 (!) 107/55 (!) 106/57 120/63  Pulse: 77 84 79 86  Resp: 18 18 18 18   Temp: 98.3 F (36.8 C) 98 F (36.7 C) 98.1 F (36.7 C) 97.7 F (36.5 C)  TempSrc: Oral Oral Oral Oral  SpO2: 99% 96% 97% 97%  Weight: 154 lb 6.4 oz (70 kg)   158 lb 3.2 oz (71.8 kg)  Height:        Intake/Output Summary (Last 24 hours) at 07/21/17 0906 Last data filed at 07/21/17 0555  Gross per 24 hour  Intake           2487.5 ml  Output             1350 ml  Net           1137.5 ml   Filed Weights   07/19/17 0537 07/20/17 0629 07/21/17 0550  Weight: 156 lb 4.9 oz (70.9 kg) 154 lb 6.4 oz (70 kg) 158 lb 3.2 oz (71.8 kg)    Telemetry    Normal sinus rhythm occasional PACs and PVCs  - Personally Reviewed  ECG    No new tracing - Personally Reviewed  Physical Exam  Smiling, relaxed GEN: No acute distress.   Neck: No JVD Cardiac: RRR, no  murmurs, rubs, or gallops.  Respiratory: Clear to auscultation bilaterally. GI: Soft, nontender, non-distended  MS: No edema; arthritic deformities of bilateral finger joints. Second and third toes on right foot in dressing. Foot is warm but both feet are rather dusky in color Neuro:  Nonfocal  Psych: Normal affect   Labs    Chemistry Recent Labs Lab 07/19/17 0422 07/20/17 0743 07/21/17 0411  NA 131* 134* 133*  K 3.4* 4.0 4.1  CL 101 104 103  CO2 24 22 25   GLUCOSE 93 91 94  BUN 7 14 11   CREATININE 0.56* 0.98 1.06  CALCIUM 8.2* 8.5* 8.3*  GFRNONAA >60 >60 >60  GFRAA >60 >60 >60  ANIONGAP 6 8 5      Hematology Recent Labs Lab 07/20/17 0457 07/20/17 0743 07/21/17 0411  WBC 12.8* 11.0* 12.4*  RBC 3.85* 3.87* 3.66*  HGB 9.8* 9.9* 9.2*  HCT 29.3* 29.7* 28.3*  MCV 76.1* 76.7* 77.3*  MCH 25.5* 25.6* 25.1*  MCHC 33.4 33.3 32.5  RDW 18.1* 18.4* 18.4*  PLT 119* 91* 121*    Cardiac Enzymes Recent Labs Lab 07/17/17 0317 07/17/17 0643 07/17/17 1339  TROPONINI <0.03 <0.03 <0.03    Recent Labs Lab 07/16/17 2346  TROPIPOC 0.01     BNP Recent Labs Lab 07/17/17 0317  BNP 145.4*     DDimer No results for input(s): DDIMER in the last 168 hours.   Radiology    No results found.  Cardiac Studies   Echocardiogram 07/17/17: Study Conclusions - Procedure narrative: Transthoracic echocardiography. Image quality was suboptimal. The study was technically difficult, as a result of poor acoustic windows, poor sound wave transmission, and body habitus. - Left ventricle: The cavity size was normal. Systolic function was normal. The estimated ejection fraction was 55%. Doppler parameters are consistent with abnormal left ventricular relaxation (grade 1 diastolic dysfunction). - Regional wall motion abnormality: Hypokinesis of the basal inferior myocardium. - Aortic valve: Mildly thickened, mildly calcified leaflets. There was mild regurgitation. -  Mitral valve: Mildly thickened leaflets . There was mild to moderate regurgitation. - Right ventricle: Systolic function was mildly reduced. - Tricuspid valve: There was mild regurgitation.   Left heart cath 07/19/16:  Colon Flattery RCA lesion, 95 %stenosed.  Prox RCA lesion, 100 %stenosed.  Ost LM to LM lesion, 70 %stenosed.  Ost Cx to Prox Cx lesion, 75 %stenosed.  Ost LAD to Prox LAD lesion, 70 %stenosed.  Ost 1st Diag to 1st Diag lesion, 70 %stenosed.  Ost 2nd Diag to 2nd Diag lesion, 70 %stenosed.  Ost 1st Sept to 1st Sept lesion, 90 %stenosed.  Hemodynamic findings consistent with mild pulmonary hypertension.  Severe ischemic cardiomyopathy with diffuse hypocontractility and hypo-to akinesis in the mid- basal inferior wall with a global ejection fraction of of 25-30%.  Severe multivessel CAD with 70% distal left main stenosis, 70% proximal LAD, diagonal 1 and diagonal 2 stenoses with 90% septal perforating artery stenoses; 75% proximal left circumflex stenoses, and total occlusion of the very proximal RCA with significant left to right collaterals.  RECOMMENDATION: Surgical consultation for CABG revascularization surgery.   Echocardiogram 07/17/16: Study Conclusions - Left ventricle: The cavity size was normal. Wall thickness was increased in a pattern of mild LVH. Systolic function was moderately to severely reduced. The estimated ejection fraction was in the range of 30% to 35%. Akinesis of the mid-apicalanteroseptal myocardium. There was fusion of early and atrial contributions to ventricular filling. - Right atrium: The atrium was mildly dilated. - Pulmonary arteries: Systolic pressure was mildly increased. PA peak pressure: 37 mm Hg (S).  Patient Profile     76 y.o. male with known severe three-vessel coronary artery disease and severe ischemic cardiomyopathy, EF 30-35%, presenting with foot pain and toe infection, complicated by  hypotension.  Assessment & Plan    1. CAD - Currently angina free  2. Hx of chronic systolic heart failure, ischemic cardiomyopathy - continue ASA, lipitor, coreg, imdur - Hemodynamically compensated, blood pressure improving, will start very low dose ACE inhibitor again  3. Necrotic toe  - osteomyelitis, complicated by sepsis, improving on antibiotics. Will eventually need surgical intervention, but extent of needed amputation not yet defined.  4.PAD - ABI performed on 07/19/2017 shows severely reduced arterial supply to the right lower extremity (ABI 0.40), moderately reduced on the (ABI 0.59). Dampened monophasic waveforms bilaterally  For questions or updates, please contact [place] Please consult [email or web address] for contact info under Cardiology/STEMI.      Signed, Sanda Klein, MD  07/21/2017, 9:06 AM

## 2017-07-21 NOTE — Care Management Important Message (Signed)
Important Message  Patient Details  Name: Vincent Dunlap MRN: [id number] Date of Birth: [birth year]   Medicare Important Message Given:  Yes    Malinda Mayden Montine Circle 07/21/2017, 11:25 AM

## 2017-07-21 NOTE — Progress Notes (Signed)
Pharmacy Antibiotic Note  Vincent Dunlap is a 76 y.o. male admitted on 07/16/2017 with osteomyelitis.  Pharmacy has been consulted for Vancomycin and Zosyn dosing.  Day #5 of abx for OM of toe, possible UTI. Arteriogram shows severe reduction in arterial flow at rest. May need second ray amputation after improvement of vascular status. A vancomycin trough today is therapeutic at 17.   Plan: Continue vancomycin 750mg  IV every 12 hours. Goal trough 15-20 mcg/mL. Continue Zosyn 3.375g IV q8h (4 hour infusion) Monitor clinical picture, renal function, VT prn F/U C&S, abx deescalation / LOT  Height: 5\' 11"  (180.3 cm) Weight: 158 lb 3.2 oz (71.8 kg) IBW/kg (Calculated) : 75.3  Temp (24hrs), Avg:97.9 F (36.6 C), Min:97.7 F (36.5 C), Max:98.1 F (36.7 C)   Recent Labs Lab 07/16/17 2114 07/17/17 0049 07/17/17 0317 07/17/17 0643 07/19/17 0422 07/20/17 0457 07/20/17 0743 07/21/17 0411 07/21/17 0943  WBC 14.6*  --   --  11.1*  --  12.8* 11.0* 12.4*  --   CREATININE 0.71  --   --  0.54* 0.56*  --  0.98 1.06  --   LATICACIDVEN  --  0.8 1.4  --   --   --   --   --   --   VANCOTROUGH  --   --   --   --   --   --   --   --  17    Estimated Creatinine Clearance: 61.2 mL/min (by C-G formula based on SCr of 1.06 mg/dL).    Allergies  Allergen Reactions  . Chantix [Varenicline] Other (See Comments)    Pt states it made him crazy   Vanc 9/9 >> Zosyn 9/9 >>  9/9 BCx: ngtd 9/9 UC: multiple species  Thank you for allowing pharmacy to be a part of this patient's care.  Salome Arnt, PharmD, BCPS 07/21/2017 11:28 AM

## 2017-07-22 DIAGNOSIS — M869 Osteomyelitis, unspecified: Secondary | ICD-10-CM

## 2017-07-22 DIAGNOSIS — I5022 Chronic systolic (congestive) heart failure: Secondary | ICD-10-CM

## 2017-07-22 LAB — CULTURE, BLOOD (ROUTINE X 2)
Culture: NO GROWTH
Culture: NO GROWTH
SPECIAL REQUESTS: ADEQUATE
Special Requests: ADEQUATE

## 2017-07-22 LAB — BASIC METABOLIC PANEL
Anion gap: 5 (ref 5–15)
BUN: 13 mg/dL (ref 6–20)
CALCIUM: 8.7 mg/dL — AB (ref 8.9–10.3)
CO2: 23 mmol/L (ref 22–32)
CREATININE: 1.1 mg/dL (ref 0.61–1.24)
Chloride: 104 mmol/L (ref 101–111)
GFR calc non Af Amer: >60 mL/min (ref >60)
GLUCOSE: 93 mg/dL (ref 65–99)
Potassium: 4.3 mmol/L (ref 3.5–5.1)
Sodium: 132 mmol/L — ABNORMAL LOW (ref 135–145)

## 2017-07-22 LAB — CBC
HEMATOCRIT: 28.8 % — AB (ref 39.0–52.0)
Hemoglobin: 9.4 g/dL — ABNORMAL LOW (ref 13.0–17.0)
MCH: 25.1 pg — ABNORMAL LOW (ref 26.0–34.0)
MCHC: 32.6 g/dL (ref 30.0–36.0)
MCV: 76.8 fL — ABNORMAL LOW (ref 78.0–100.0)
PLATELETS: 146 10*3/uL — AB (ref 150–400)
RBC: 3.75 MIL/uL — ABNORMAL LOW (ref 4.22–5.81)
RDW: 18.3 % — AB (ref 11.5–15.5)
WBC: 11.7 10*3/uL — ABNORMAL HIGH (ref 4.0–10.5)

## 2017-07-22 LAB — GLUCOSE, CAPILLARY: Glucose-Capillary: 93 mg/dL (ref 65–99)

## 2017-07-22 NOTE — Consult Note (Signed)
Hospital Consult    Reason for Consult:  Non healing toe ulcer Requesting Physician:  Wynetta Emery MRN #:  [id number]  History of Present Illness: This is a 76 y.o. male who states that he was at home and his friend had a friend that is a nurse come check on him.  He states that he was having chest pain and she looked at his foot and took his blood pressure, which was low and at that time, 911 was called and he was brought to the hospital and admitted.   He is on IV abx.  He states that he has had this wound on the 2nd left toe for about a year.  He states that he has pain with it and has trouble sleeping because of it.  He states he has been soaking it in hot water.  He has also had burning with urination. He states that he has also had dizziness.    He has not been able to walk bc of the pain.  He has a friend that stays with him to help take care of him.    He has known 3V CAD and EF of 30%.  He is on asa/statin/beta blocker/imdur.  Cardiology has been consulted.  He had negative cardiac enzymes and no further chest pain.  No further workup at this time.  He does smoke about 3/4 ppd and has for most of his life.  Past Medical History:  Diagnosis Date  . CAD (coronary artery disease) 07/2016   3 vessel disease,  evaluated by Dr Servando Snare and felt to be a poor surgical candidate  . Hypertension   . Ischemic cardiomyopathy 2017   EF 30%  . Tobacco abuse     Past Surgical History:  Procedure Laterality Date  . CARDIAC CATHETERIZATION N/A 07/19/2016   Procedure: Right/Left Heart Cath and Coronary Angiography;  Surgeon: Troy Sine, MD;  Location: [facility];  Service: Cardiovascular;  Laterality: N/A;  . INGUINAL HERNIA REPAIR Bilateral     Allergies  Allergen Reactions  . Chantix [Varenicline] Other (See Comments)    Pt states it made him crazy    Prior to Admission medications   Medication Sig Start Date End Date Taking? Authorizing Provider  amLODipine-benazepril  (LOTREL) 5-20 MG capsule Take 1 capsule by mouth daily.   Yes [provider]  atorvastatin (LIPITOR) 80 MG tablet Take 1 tablet (80 mg total) by mouth daily at 6 PM. 07/25/16  Yes Regalado, Belkys A, MD  carvedilol (COREG) 6.25 MG tablet Take 1 tablet (6.25 mg total) by mouth 2 (two) times daily with a meal. 07/25/16  Yes Regalado, Belkys A, MD  lisinopril (PRINIVIL,ZESTRIL) 2.5 MG tablet Take 1 tablet (2.5 mg total) by mouth daily. 07/26/16  Yes Regalado, Belkys A, MD  nitroGLYCERIN (NITROSTAT) 0.4 MG SL tablet Place 1 tablet (0.4 mg total) under the tongue every 5 (five) minutes as needed for chest pain. 07/25/16  Yes Regalado, Belkys A, MD  spironolactone (ALDACTONE) 25 MG tablet Take 0.5 tablets (12.5 mg total) by mouth daily. 07/26/16  Yes Regalado, Cassie Freer, MD    Social History   Social History  . Marital status: Single    Spouse name: N/A  . Number of children: N/A  . Years of education: N/A   Occupational History  . Not on file.   Social History Main Topics  . Smoking status: Current Every Day Smoker  . Smokeless tobacco: Former Systems developer     Comment: 1  pack per day ages [age]  . Alcohol use No     Comment: Former heavy binge drinker until 2014  . Drug use: No  . Sexual activity: Not on file   Other Topics Concern  . Not on file   Social History Narrative  . No narrative on file     Family History  Problem Relation Age of Onset  . Alzheimer's disease Mother   . Diabetes Mother        Diabetes in the maternal side of the family but not in mother  . Heart disease Neg Hx   . Cancer Neg Hx     ROS: [x]  Positive   [ ]  Negative   [ ]  All sytems reviewed and are negative  Cardiac: [x]  chest pain/pressure []  palpitations []  SOB lying flat []  DOE  Vascular: []  pain in legs while walking [x]  pain in legs at rest [x]  pain in legs at night [x]  non-healing ulcers []  hx of DVT []  swelling in legs  Pulmonary: []  productive cough []  asthma/wheezing []  home  O2  Neurologic: []  weakness in []  arms []  legs []  numbness in []  arms []  legs []  hx of CVA []  mini stroke [] difficulty speaking or slurred speech []  temporary loss of vision in one eye []  dizziness  Hematologic: []  hx of cancer []  bleeding problems []  problems with blood clotting easily  Endocrine:   []  diabetes []  thyroid disease  GI []  vomiting blood []  blood in stool  GU: []  CKD/renal failure []  HD--[]  M/W/F or []  T/T/S []  burning with urination []  blood in urine  Psychiatric: []  anxiety []  depression  Musculoskeletal: []  arthritis []  joint pain  Integumentary: []  rashes []  ulcers  Constitutional: []  fever []  chills   Physical Examination  Vitals:   07/22/17 0454 07/22/17 1140  BP: 122/79 (!) 123/52  Pulse: 87 69  Resp: 18 18  Temp: 98.1 F (36.7 C) 97.7 F (36.5 C)  SpO2: 97% 99%   Body mass index is 21.99 kg/m.  General:  WDWN in NAD Gait: Not observed HENT: WNL, normocephalic Pulmonary: normal non-labored breathing, without Rales, rhonchi,  wheezing Cardiac: regular, without  Murmurs, rubs or gallops; without carotid bruits Abdomen:  soft, NT/ND, no masses Skin: without rashes Vascular Exam/Pulses:  Right Left  Radial 2+ (normal) 2+ (normal)  Ulnar 2+ (normal) 2+ (normal)  Femoral trace 2+ (normal)  Popliteal Unable to palpate  Unable to palpate   DP + monophasic AT doppler signal + DP doppler signal  PT Faint doppler signal Absent doppler signal  Peroneal + doppler signal Faint doppler signal   Extremities: without ischemic changes, with Gangrene tip of right 2nd toe that is malodorous , without cellulitis; without open wounds      Musculoskeletal: no muscle wasting or atrophy  Neurologic: A&O X 3;  No focal weakness or paresthesias are detected; speech is fluent/normal Psychiatric:  The pt has Normal affect. Lymph:  No inguinal lymphadenopathy   CBC    Component Value Date/Time   WBC 11.7 (H) 07/22/2017 0429   RBC  3.75 (L) 07/22/2017 0429   HGB 9.4 (L) 07/22/2017 0429   HGB 12.5 (L) 08/30/2012 1348   HCT 28.8 (L) 07/22/2017 0429   HCT 37.4 (L) 08/30/2012 1348   PLT 146 (L) 07/22/2017 0429   PLT 68 (L) 08/30/2012 1348   MCV 76.8 (L) 07/22/2017 0429   MCV 91.2 08/30/2012 1348   MCH 25.1 (L) 07/22/2017 0429   MCHC 32.6 07/22/2017 0429  RDW 18.3 (H) 07/22/2017 0429   RDW 16.0 (H) 08/30/2012 1348   LYMPHSABS 4.1 (H) 07/19/2016 0302   LYMPHSABS 3.3 08/30/2012 1348   MONOABS 1.2 (H) 07/19/2016 0302   MONOABS 0.9 08/30/2012 1348   EOSABS 0.3 07/19/2016 0302   EOSABS 0.2 08/30/2012 1348   BASOSABS 0.0 07/19/2016 0302   BASOSABS 0.0 08/30/2012 1348    BMET    Component Value Date/Time   NA 132 (L) 07/22/2017 0429   NA 135 (L) 08/30/2012 1348   K 4.3 07/22/2017 0429   K 4.5 08/30/2012 1348   CL 104 07/22/2017 0429   CL 103 08/30/2012 1348   CO2 23 07/22/2017 0429   CO2 23 08/30/2012 1348   GLUCOSE 93 07/22/2017 0429   GLUCOSE 107 (H) 08/30/2012 1348   BUN 13 07/22/2017 0429   BUN 10.0 08/30/2012 1348   CREATININE 1.10 07/22/2017 0429   CREATININE 0.8 08/30/2012 1348   CALCIUM 8.7 (L) 07/22/2017 0429   CALCIUM 9.3 08/30/2012 1348   GFRNONAA >60 07/22/2017 0429   GFRAA >60 07/22/2017 0429    COAGS: Lab Results  Component Value Date   INR 1.12 07/17/2017   INR 1.10 07/19/2016   INR 3.86 (H) 08/31/2011     Non-Invasive Vascular Imaging:    RIGHT    LEFT    PRESSURE WAVEFORM  PRESSURE WAVEFORM  BRACHIAL 101 Triphasic BRACHIAL       DP 60 Severely Dampened Monophasic  AT 40 Severely Dampened Monophasic     PT 40 Dampened Monophasic PT 42 Dampened Monophasic    RIGHT LEFT  ABI 0.40 0.59     Statin:  Yes.   Beta Blocker:  Yes.   Aspirin:  Yes.   ACEI:  Yes. ARB:  No. CCB use:  No Other antiplatelets/anticoagulants:  No.    ASSESSMENT/PLAN: This is a 76 y.o. male with non healing wound on the right 2nd toe   -pt with arterial insufficiency BLE with  the right >left.  He has a faintly palpable right femoral pulse.  He will need arteriogram with BLE runoff and possible intervention prior to having toe amputation.  Hopefully, there will be an endovascular intervention as he will be high risk for surgical procedure.  -discussed the importance of smoking cessation with pt -continue abx per primary team.  Leontine Locket, PA-C [facility] [phone]  I have examined the patient, reviewed and agree with above. Diminished right femoral pulse and normal left femoral pulse. Absent popliteal and distal pulses. Do not feel he has adequate flow for healing of his right toe. Explained that although he just has gangrene on the tip of the toe that with his diminished flow he would not be able to heal the toe amputation. Has a severe coronary disease and is not a candidate for revascularization. Explained that his only option would be endovascular. Will plan arteriography Brelan Hannen next week and possible intervention. Will not follow actively. Patient is currently scheduled for arteriogram on Tuesday, 07/26/2017  Curt Jews, MD 07/22/2017 6:13 PM

## 2017-07-22 NOTE — Progress Notes (Signed)
[facility] PROGRESS NOTE  Vincent Dunlap YME:[id number] DOB: 08-17-1941 DOA: 07/16/2017 PCP: Thressa Sheller, MD  Interim summary and history of present illness 76 y.o. male with medical history significant of hypertension, hyperlipidemia, COPD, GERD, depression, formal smoker, sCHF with EF of 30-35%, CAD (multivessel disease, but not a candidate for CABG), chronic thrombocytopenia, who presents with foot pain, chest pain, and dysuria. Patient found to have sepsis most likely due to UTI and osteomyelitis on his right second toe.  Assessment/Plan: 1-sepsis secondary to osteomyelitis and UTI -Patient responded well to IV fluid resuscitation and the use of broad-spectrum antibiotics -overall hemodynamically stable at this time -Will follow results from blood cultures and urine culture -Continue vanc and zosyn -Assessment of vasculature integrity on his right leg with ABI severe reduction in arterial flow on right and moderate reduction on left.    -orthopedic service on board and will follow their recs -Vascular surgery consult requested  2-intermittent chest discomfort in patient with history of coronary artery disease: Patient had a history of known coronary artery disease (multiple vessels) found not a candidate for CABG. -He has been experiencing stable angina for years -Continue conservative management with the use of aspirin, statins, beta blocker and imdur -Cardiology has seen patient and recommended no further ischemic workup at this moment. -neg troponin X3; no CP  3-history of COPD -No wheezing and with good air movement bilaterally  -Will continue as needed albuterol nebulizer -cessation counseling ongoing  4-chronic idiopathic thrombocytopenia -No signs of acute bleeding -Will continue monitoring platelets  5-tobacco abuse -Cessation counseling provided -Nicotine patch has been ordered  6-hypertension -BP soft and low especially when changing positions -following  cardiology recs, on coreg, lisinopril, imdur  7-hyperlipidemia -will continue lipitor 80 mg daily  8-stage 2 sacral pressure injury -continue preventive measures -overlay mattress ordered   9-mild hyponatremia: from dehydration  -will continue gentle IVF's -patient able to eat and drink well now -will monitor electrolytes intermittently   Code Status: Full code Family Communication: None at bedside Disposition Plan: To be determined.  Consultants:  Neurology service  Orthopedic service  Procedures:  See below for x-ray reports   2-D echo - Procedure narrative: Transthoracic echocardiography. Image   quality was suboptimal. The study was technically difficult, as a   result of poor acoustic windows, poor sound wave transmission,   and body habitus. - Left ventricle: The cavity size was normal. Systolic function was normal. The estimated ejection fraction was 55%. Doppler parameters are consistent with abnormal left ventricular   relaxation (grade 1 diastolic dysfunction). - Regional wall motion abnormality: Hypokinesis of the basal inferior myocardium. - Aortic valve: Mildly thickened, mildly calcified leaflets. There was mild regurgitation. - Mitral valve: Mildly thickened leaflets . There was mild to moderate regurgitation. - Right ventricle: Systolic function was mildly reduced. - Tricuspid valve: There was mild regurgitation.   Right lower extremity ABI  Antibiotics:  Vancomycin and Zosyn 9/8  HPI/Subjective: Pt craving cigarettes and having pain in right foot/toes    Objective: Vitals:   07/22/17 0454 07/22/17 1140  BP: 122/79 (!) 123/52  Pulse: 87 69  Resp: 18 18  Temp: 98.1 F (36.7 C) 97.7 F (36.5 C)  SpO2: 97% 99%    Intake/Output Summary (Last 24 hours) at 07/22/17 1211 Last data filed at 07/22/17 0910  Gross per 24 hour  Intake          1844.17 ml  Output  2725 ml  Net          -880.83 ml   Filed Weights   07/20/17 0629  07/21/17 0550 07/22/17 0454  Weight: 70 kg (154 lb 6.4 oz) 71.8 kg (158 lb 3.2 oz) 71.5 kg (157 lb 11.2 oz)    Exam:   General:  Afebrile, sleepy but arousable, no CP, no nausea, no vomiting.    Cardiovascular: S1 and S2, no rubs, no gallops, no JVD.  Respiratory: normal resp effort, no wheezing, no crackles   Abdomen: soft, NT, ND, positive BS  Musculoskeletal: trace edema bilaterally; mild erythematous changes in dorsal aspect of his foot, right second toe with necrotic tip. Patient decrease pedal and popliteal on RLE.   Skin: stage 2 sacral pressure injury, no superimposed infection appreciated.  Data Reviewed: Basic Metabolic Panel:  Recent Labs Lab 07/17/17 0643 07/19/17 0422 07/20/17 0743 07/21/17 0411 07/22/17 0429  NA 133* 131* 134* 133* 132*  K 3.6 3.4* 4.0 4.1 4.3  CL 103 101 104 103 104  CO2 24 24 22 25 23   GLUCOSE 106* 93 91 94 93  BUN 13 7 14 11 13   CREATININE 0.54* 0.56* 0.98 1.06 1.10  CALCIUM 8.6* 8.2* 8.5* 8.3* 8.7*   CBC:  Recent Labs Lab 07/17/17 0643 07/20/17 0457 07/20/17 0743 07/21/17 0411 07/22/17 0429  WBC 11.1* 12.8* 11.0* 12.4* 11.7*  HGB 9.9* 9.8* 9.9* 9.2* 9.4*  HCT 30.4* 29.3* 29.7* 28.3* 28.8*  MCV 76.8* 76.1* 76.7* 77.3* 76.8*  PLT 56* 119* 91* 121* 146*   Cardiac Enzymes:  Recent Labs Lab 07/17/17 0317 07/17/17 0643 07/17/17 1339  TROPONINI <0.03 <0.03 <0.03   BNP (last 3 results)  Recent Labs  07/17/17 0317  BNP 145.4*   CBG:  Recent Labs Lab 07/18/17 0539 07/19/17 0605 07/20/17 0628 07/21/17 0547 07/22/17 0536  GLUCAP 109* 109* 98 80 93   Studies: No results found.  Scheduled Meds: . aspirin  81 mg Oral Daily  . atorvastatin  80 mg Oral q1800  . carvedilol  6.25 mg Oral BID WC  . feeding supplement (ENSURE ENLIVE)  237 mL Oral BID BM  . isosorbide mononitrate  30 mg Oral Q24H  . lisinopril  2.5 mg Oral Daily  . multivitamin with minerals  1 tablet Oral Daily  . nicotine  14 mg Transdermal  Daily   Continuous Infusions: . sodium chloride 50 mL/hr (07/21/17 1704)  . piperacillin-tazobactam (ZOSYN)  IV Stopped (07/22/17 1038)  . vancomycin Stopped (07/22/17 1036)    Principal Problem:   Osteomyelitis of toe of right foot ([place]) Active Problems:   CAD (coronary artery disease), native coronary artery   Chronic systolic CHF (congestive heart failure) (HCC)   COPD (chronic obstructive pulmonary disease) (HCC)   Chronic idiopathic thrombocytopenia (HCC)   UTI (urinary tract infection)   Sepsis (HCC)   Hypotension   Chest pain   Pressure injury of skin  Time spent: 22 minutes  Masami Plata [employer] [phone]. If 7PM-7AM, please contact night-coverage at [email or web address], password [facility] 07/22/2017, 12:11 PM  LOS: 5 days

## 2017-07-22 NOTE — Progress Notes (Signed)
No new cardiac developments. BP holding steady, low normal range. Will keep on current ACEi dose over the weekend, increase on Monday if BP allows. ACEi should be held on the day of his angiogram. Please call Cardiology with any new concerns over the weekend.  Sanda Klein, MD, [facility] [phone] office [phone] pager

## 2017-07-23 LAB — BASIC METABOLIC PANEL
ANION GAP: 6 (ref 5–15)
BUN: 12 mg/dL (ref 6–20)
CHLORIDE: 104 mmol/L (ref 101–111)
CO2: 22 mmol/L (ref 22–32)
Calcium: 8.7 mg/dL — ABNORMAL LOW (ref 8.9–10.3)
Creatinine, Ser: 0.91 mg/dL (ref 0.61–1.24)
GFR calc non Af Amer: >60 mL/min (ref >60)
Glucose, Bld: 85 mg/dL (ref 65–99)
Potassium: 4 mmol/L (ref 3.5–5.1)
SODIUM: 132 mmol/L — AB (ref 135–145)

## 2017-07-23 LAB — CBC
HCT: 29.7 % — ABNORMAL LOW (ref 39.0–52.0)
HEMOGLOBIN: 9.8 g/dL — AB (ref 13.0–17.0)
MCH: 25.4 pg — ABNORMAL LOW (ref 26.0–34.0)
MCHC: 33 g/dL (ref 30.0–36.0)
MCV: 76.9 fL — ABNORMAL LOW (ref 78.0–100.0)
Platelets: 143 10*3/uL — ABNORMAL LOW (ref 150–400)
RBC: 3.86 MIL/uL — AB (ref 4.22–5.81)
RDW: 18.4 % — ABNORMAL HIGH (ref 11.5–15.5)
WBC: 11.7 10*3/uL — AB (ref 4.0–10.5)

## 2017-07-23 MED ORDER — POLYETHYLENE GLYCOL 3350 17 G PO PACK
17.0000 g | PACK | Freq: Every day | ORAL | Status: DC
  Administered 2017-07-23: 17 g via ORAL
  Filled 2017-07-23: qty 1

## 2017-07-23 MED ORDER — POLYETHYLENE GLYCOL 3350 17 G PO PACK: 17.0000 g | PACK | Freq: Two times a day (BID) | ORAL | Status: DC

## 2017-07-23 NOTE — Progress Notes (Signed)
Patient stable during 7 a to 7 p shift, continues to complain of pain in his right foot that goes all the way to the knee per patient, receiving Percocet with some relief.  RN asked patient repeatedly to get out of bed and sit in chair which patient refuses, says he was not getting up at home and had to be "basically carried around".  Patient drinking Ensure.

## 2017-07-23 NOTE — Progress Notes (Signed)
[facility] PROGRESS NOTE  MARSALIS BEAULIEU MBW:[id number] DOB: [birth year] DOA: 07/16/2017 PCP: Thressa Sheller, MD  Interim summary and history of present illness 76 y.o. male with medical history significant of hypertension, hyperlipidemia, COPD, GERD, depression, formal smoker, sCHF with EF of 30-35%, CAD (multivessel disease, but not a candidate for CABG), chronic thrombocytopenia, who presents with foot pain, chest pain, and dysuria. Patient found to have sepsis most likely due to UTI and osteomyelitis on his right second toe.  Assessment/Plan: 1-sepsis secondary to osteomyelitis and UTI -Patient responded well to IV fluid resuscitation and the use of broad-spectrum antibiotics -overall hemodynamically stable at this time -Will follow results from blood cultures and urine culture -Continue vanc and zosyn -Assessment of vasculature integrity on his right leg with ABI severe reduction in arterial flow on right and moderate reduction on left.    -orthopedic service on board and will follow their recs -Vascular surgery consult requested: Dr. Donnetta Hutching saw him and because he has severe coronary disease and is not a candidate for revascularization. Explained that his only option would be endovascular. They are planning arteriography early next week and possible intervention.  2-intermittent chest discomfort in patient with history of coronary artery disease: Patient had a history of known coronary artery disease (multiple vessels) found not a candidate for CABG. -He has been experiencing stable angina for years -Continue conservative management with the use of aspirin, statins, beta blocker and imdur -Cardiology has seen patient and recommended no further ischemic workup at this moment. -neg troponin X3; no CP  3-history of COPD -No wheezing and with good air movement bilaterally  -Will continue as needed albuterol nebulizer -cessation counseling ongoing  4-chronic idiopathic  thrombocytopenia -No signs of acute bleeding -Will continue monitoring platelets  5-tobacco abuse -Cessation counseling provided -Nicotine patch has been ordered  6-hypertension -BP soft and low especially when changing positions -following cardiology recs, on coreg, lisinopril, imdur  7-hyperlipidemia -will continue lipitor 80 mg daily  8-stage 2 sacral pressure injury -continue preventive measures -overlay mattress ordered   9-mild hyponatremia: from dehydration  -will continue gentle IVF's -patient able to eat and drink well now -will monitor electrolytes intermittently   Code Status: Full code Family Communication: None at bedside Disposition Plan: To be determined.  Consultants:  Neurology service  Orthopedic service  Procedures:  See below for x-ray reports   2-D echo - Procedure narrative: Transthoracic echocardiography. Image   quality was suboptimal. The study was technically difficult, as a   result of poor acoustic windows, poor sound wave transmission,   and body habitus. - Left ventricle: The cavity size was normal. Systolic function was normal. The estimated ejection fraction was 55%. Doppler parameters are consistent with abnormal left ventricular   relaxation (grade 1 diastolic dysfunction). - Regional wall motion abnormality: Hypokinesis of the basal inferior myocardium. - Aortic valve: Mildly thickened, mildly calcified leaflets. There was mild regurgitation. - Mitral valve: Mildly thickened leaflets . There was mild to moderate regurgitation. - Right ventricle: Systolic function was mildly reduced. - Tricuspid valve: There was mild regurgitation.   Right lower extremity ABI  Antibiotics:  Vancomycin and Zosyn 9/8  HPI/Subjective: Pt reports pain in right toes    Objective: Vitals:   07/23/17 0545 07/23/17 1028  BP: (!) 142/66 122/76  Pulse: 80 75  Resp: 18   Temp: 97.8 F (36.6 C)   SpO2: 97% 96%    Intake/Output Summary (Last  24 hours) at 07/23/17 1223 Last data filed at 07/23/17 1046  Gross per 24 hour  Intake          2858.33 ml  Output             3750 ml  Net          -891.67 ml   Filed Weights   07/21/17 0550 07/22/17 0454 07/23/17 0545  Weight: 71.8 kg (158 lb 3.2 oz) 71.5 kg (157 lb 11.2 oz) 70.2 kg (154 lb 12.8 oz)    Exam:   General:  Afebrile, sleepy but arousable, no CP, no nausea, no vomiting.    Cardiovascular: S1 and S2, no rubs, no gallops, no JVD.  Respiratory: normal resp effort, no wheezing, no crackles   Abdomen: soft, NT, ND, positive BS  Musculoskeletal: trace edema bilaterally; mild erythematous changes in dorsal aspect of his foot, right second toe with necrotic tip. Patient decrease pedal and popliteal on RLE.   Skin: stage 2 sacral pressure injury, no superimposed infection appreciated.  Data Reviewed: Basic Metabolic Panel:  Recent Labs Lab 07/19/17 0422 07/20/17 0743 07/21/17 0411 07/22/17 0429 07/23/17 0553  NA 131* 134* 133* 132* 132*  K 3.4* 4.0 4.1 4.3 4.0  CL 101 104 103 104 104  CO2 24 22 25 23 22   GLUCOSE 93 91 94 93 85  BUN 7 14 11 13 12   CREATININE 0.56* 0.98 1.06 1.10 0.91  CALCIUM 8.2* 8.5* 8.3* 8.7* 8.7*   CBC:  Recent Labs Lab 07/20/17 0457 07/20/17 0743 07/21/17 0411 07/22/17 0429 07/23/17 0553  WBC 12.8* 11.0* 12.4* 11.7* 11.7*  HGB 9.8* 9.9* 9.2* 9.4* 9.8*  HCT 29.3* 29.7* 28.3* 28.8* 29.7*  MCV 76.1* 76.7* 77.3* 76.8* 76.9*  PLT 119* 91* 121* 146* 143*   Cardiac Enzymes:  Recent Labs Lab 07/17/17 0317 07/17/17 0643 07/17/17 1339  TROPONINI <0.03 <0.03 <0.03   BNP (last 3 results)  Recent Labs  07/17/17 0317  BNP 145.4*   CBG:  Recent Labs Lab 07/18/17 0539 07/19/17 0605 07/20/17 0628 07/21/17 0547 07/22/17 0536  GLUCAP 109* 109* 98 80 93   Studies: No results found.  Scheduled Meds: . aspirin  81 mg Oral Daily  . atorvastatin  80 mg Oral q1800  . carvedilol  6.25 mg Oral BID WC  . feeding supplement  (ENSURE ENLIVE)  237 mL Oral BID BM  . isosorbide mononitrate  30 mg Oral Q24H  . lisinopril  2.5 mg Oral Daily  . multivitamin with minerals  1 tablet Oral Daily  . nicotine  14 mg Transdermal Daily   Continuous Infusions: . sodium chloride 50 mL/hr at 07/22/17 2000  . piperacillin-tazobactam (ZOSYN)  IV Stopped (07/23/17 1000)  . vancomycin 750 mg (07/23/17 1039)    Principal Problem:   Osteomyelitis of toe of right foot ([place]) Active Problems:   CAD (coronary artery disease), native coronary artery   Chronic systolic CHF (congestive heart failure) (HCC)   COPD (chronic obstructive pulmonary disease) (HCC)   Chronic idiopathic thrombocytopenia (HCC)   UTI (urinary tract infection)   Sepsis (HCC)   Hypotension   Chest pain   Pressure injury of skin  Time spent: 21 minutes  Jamara Vary [employer] [phone]. If 7PM-7AM, please contact night-coverage at [email or web address], password [facility] 07/23/2017, 12:23 PM  LOS: 6 days

## 2017-07-24 LAB — GLUCOSE, CAPILLARY: Glucose-Capillary: 116 mg/dL — ABNORMAL HIGH (ref 65–99)

## 2017-07-24 MED ORDER — SENNOSIDES-DOCUSATE SODIUM 8.6-50 MG PO TABS
1.0000 | ORAL_TABLET | Freq: Every day | ORAL | Status: DC
  Administered 2017-07-24 – 2017-07-28 (×5): 1 via ORAL
  Filled 2017-07-24 (×5): qty 1

## 2017-07-24 MED ORDER — POLYETHYLENE GLYCOL 3350 17 G PO PACK
17.0000 g | PACK | Freq: Two times a day (BID) | ORAL | Status: DC
  Administered 2017-07-24 – 2017-07-30 (×10): 17 g via ORAL
  Filled 2017-07-24 (×11): qty 1

## 2017-07-24 NOTE — Progress Notes (Signed)
Patient remains stable during 7 a to 7 p, no complaints other than continued pain in the right foot managed with Percocet.  Patient worked with therapy walking in room and sat up in the chair for a couple of hours.  Patient anticipating arteriogram and upcoming intervention for toe.

## 2017-07-24 NOTE — Plan of Care (Signed)
Problem: Health Behavior/Discharge Planning: Goal: Ability to manage health-related needs will improve Outcome: Progressing Patient verbalizes understanding of possible intervention for toe   Problem: Bowel/Gastric: Goal: Will not experience complications related to bowel motility Outcome: Progressing Prune juice and miralax given

## 2017-07-24 NOTE — Progress Notes (Signed)
Pharmacy Antibiotic Note  Vincent Dunlap is a 76 y.o. male admitted on 07/16/2017 with osteomyelitis.  Pharmacy has been consulted for Vancomycin and Zosyn dosing.  Day #8 of abx for OM of toe, possible UTI. Arteriogram shows severe reduction in arterial flow at rest. Notes indicate due to severe coronary disease, unlikely a candidate for revascularization. Plan for possible arteriography this upcoming week. Last vancomycin trough on 9/13 was within therapeutic range at 17. Renal function continues to remain stable.   Plan: Continue vancomycin 750mg  IV every 12 hours. Goal trough 15-20 mcg/mL. Continue Zosyn 3.375g IV q8h (4 hour infusion) Monitor clinical picture, renal function, VT prn F/U C&S, abx deescalation / LOT  Height: 5\' 11"  (180.3 cm) Weight: 157 lb 1.6 oz (71.3 kg) IBW/kg (Calculated) : 75.3  Temp (24hrs), Avg:97.9 F (36.6 C), Min:97.7 F (36.5 C), Max:98.1 F (36.7 C)   Recent Labs Lab 07/19/17 0422 07/20/17 0457 07/20/17 0743 07/21/17 0411 07/21/17 0943 07/22/17 0429 07/23/17 0553  WBC  --  12.8* 11.0* 12.4*  --  11.7* 11.7*  CREATININE 0.56*  --  0.98 1.06  --  1.10 0.91  VANCOTROUGH  --   --   --   --  17  --   --     Estimated Creatinine Clearance: 70.7 mL/min (by C-G formula based on SCr of 0.91 mg/dL).    Allergies  Allergen Reactions  . Chantix [Varenicline] Other (See Comments)    Pt states it made him crazy   Vanc 9/9 >> Zosyn 9/9 >>  9/9 BCx: ngtd 9/9 UC: multiple species>recollect   Thank you for allowing pharmacy to be a part of this patient's care.  Doylene Canard, PharmD Clinical Pharmacist  Phone: [phone] 07/24/2017 12:37 PM

## 2017-07-24 NOTE — Progress Notes (Signed)
[facility] PROGRESS NOTE  EON ZUNKER YWV:[id number] DOB: 29-Jan-1941 DOA: 07/16/2017 PCP: Thressa Sheller, MD  Interim summary and history of present illness 76 y.o. male with medical history significant of hypertension, hyperlipidemia, COPD, GERD, depression, formal smoker, sCHF with EF of 30-35%, CAD (multivessel disease, but not a candidate for CABG), chronic thrombocytopenia, who presents with foot pain, chest pain, and dysuria. Patient found to have sepsis most likely due to UTI and osteomyelitis on his right second toe.  Assessment/Plan: 1-sepsis secondary to osteomyelitis and UTI -Patient responded well to IV fluid resuscitation and the use of broad-spectrum antibiotics -overall hemodynamically stable at this time -Will follow results from blood cultures and urine culture -Continue vanc and zosyn -Assessment of vasculature integrity on his right leg with ABI severe reduction in arterial flow on right and moderate reduction on left.    -orthopedic service on board and will follow their recs -Vascular surgery consult requested: Dr. Donnetta Hutching saw him and because he has severe coronary disease and is not a candidate for revascularization. Explained that his only option would be endovascular. They are planning arteriography early next week and possible intervention.  Encouraged ambulation and PT evaluation.  2-intermittent chest discomfort in patient with history of coronary artery disease: Patient had a history of known coronary artery disease (multiple vessels) found not a candidate for CABG. -He has been experiencing stable angina for years -Continue conservative management with the use of aspirin, statins, beta blocker and imdur -Cardiology has seen patient and recommended no further ischemic workup at this moment. -neg troponin X3; no CP  3-history of COPD -No wheezing and with good air movement bilaterally  -Will continue as needed albuterol nebulizer -cessation counseling  ongoing  4-chronic idiopathic thrombocytopenia -No signs of acute bleeding -Will continue monitoring platelets, stable to improved.  5-tobacco abuse -Cessation counseling provided -Nicotine patch has been ordered  6-hypertension -BP soft and low especially when changing positions -following cardiology recs, on coreg, lisinopril, imdur  7-hyperlipidemia -will continue lipitor 80 mg daily  8-stage 2 sacral pressure injury -continue preventive measures -overlay mattress ordered   9-mild hyponatremia: from dehydration  -will continue gentle IVF's -patient able to eat and drink well now -will monitor electrolytes   Code Status: Full code Family Communication: None at bedside Disposition Plan: To be determined.  Consultants:  Neurology service  Orthopedic service  Procedures:  See below for x-ray reports   2-D echo - Procedure narrative: Transthoracic echocardiography. Image   quality was suboptimal. The study was technically difficult, as a   result of poor acoustic windows, poor sound wave transmission,   and body habitus. - Left ventricle: The cavity size was normal. Systolic function was normal. The estimated ejection fraction was 55%. Doppler parameters are consistent with abnormal left ventricular relaxation (grade 1 diastolic dysfunction). - Regional wall motion abnormality: Hypokinesis of the basal inferior myocardium. - Aortic valve: Mildly thickened, mildly calcified leaflets. There was mild regurgitation. - Mitral valve: Mildly thickened leaflets . There was mild to moderate regurgitation. - Right ventricle: Systolic function was mildly reduced. - Tricuspid valve: There was mild regurgitation.   Right lower extremity ABI  Antibiotics:  Vancomycin and Zosyn 9/8  HPI/Subjective: Pt not ambulating as much as we would like. He does not seem motivated to get up out of bed. He still has toe pain but seems to be controlled with pain medications. He is having  bowel movements.    Objective: Vitals:   07/24/17 0542 07/24/17 0950  BP: (!) 114/59  98/73  Pulse: 80 70  Resp: 16   Temp: 98.1 F (36.7 C)   SpO2: 97% 96%    Intake/Output Summary (Last 24 hours) at 07/24/17 0954 Last data filed at 07/24/17 0400  Gross per 24 hour  Intake          1888.33 ml  Output             2450 ml  Net          -561.67 ml   Filed Weights   07/22/17 0454 07/23/17 0545 07/24/17 0542  Weight: 71.5 kg (157 lb 11.2 oz) 70.2 kg (154 lb 12.8 oz) 71.3 kg (157 lb 1.6 oz)    Exam:   General:  Afebrile, sleepy but arousable, no CP, no nausea, no vomiting.    Cardiovascular: S1 and S2, no rubs, no gallops, no JVD.  Respiratory: normal resp effort, no wheezing, no crackles   Abdomen: soft, NT, ND, positive BS  Musculoskeletal: trace edema bilaterally; mild erythematous changes in dorsal aspect of his foot, right second toe with necrotic tip. Patient decrease pedal and popliteal on RLE. Wound clean and dry.  Skin: stage 2 sacral pressure injury, no superimposed infection  Data Reviewed: Basic Metabolic Panel:  Recent Labs Lab 07/19/17 0422 07/20/17 0743 07/21/17 0411 07/22/17 0429 07/23/17 0553  NA 131* 134* 133* 132* 132*  K 3.4* 4.0 4.1 4.3 4.0  CL 101 104 103 104 104  CO2 24 22 25 23 22   GLUCOSE 93 91 94 93 85  BUN 7 14 11 13 12   CREATININE 0.56* 0.98 1.06 1.10 0.91  CALCIUM 8.2* 8.5* 8.3* 8.7* 8.7*   CBC:  Recent Labs Lab 07/20/17 0457 07/20/17 0743 07/21/17 0411 07/22/17 0429 07/23/17 0553  WBC 12.8* 11.0* 12.4* 11.7* 11.7*  HGB 9.8* 9.9* 9.2* 9.4* 9.8*  HCT 29.3* 29.7* 28.3* 28.8* 29.7*  MCV 76.1* 76.7* 77.3* 76.8* 76.9*  PLT 119* 91* 121* 146* 143*   Cardiac Enzymes:  Recent Labs Lab 07/17/17 1339  TROPONINI <0.03   BNP (last 3 results)  Recent Labs  07/17/17 0317  BNP 145.4*   CBG:  Recent Labs Lab 07/19/17 0605 07/20/17 0628 07/21/17 0547 07/22/17 0536 07/24/17 0652  GLUCAP 109* 98 80 93 116*    Studies: No results found.  Scheduled Meds: . aspirin  81 mg Oral Daily  . atorvastatin  80 mg Oral q1800  . carvedilol  6.25 mg Oral BID WC  . feeding supplement (ENSURE ENLIVE)  237 mL Oral BID BM  . isosorbide mononitrate  30 mg Oral Q24H  . lisinopril  2.5 mg Oral Daily  . multivitamin with minerals  1 tablet Oral Daily  . nicotine  14 mg Transdermal Daily  . polyethylene glycol  17 g Oral BID  . senna-docusate  1 tablet Oral QHS   Continuous Infusions: . piperacillin-tazobactam (ZOSYN)  IV 3.375 g (07/24/17 0550)  . vancomycin Stopped (07/23/17 2320)    Principal Problem:   Osteomyelitis of toe of right foot ([place]) Active Problems:   CAD (coronary artery disease), native coronary artery   Chronic systolic CHF (congestive heart failure) (HCC)   COPD (chronic obstructive pulmonary disease) (HCC)   Chronic idiopathic thrombocytopenia (HCC)   UTI (urinary tract infection)   Sepsis (HCC)   Hypotension   Chest pain   Pressure injury of skin  Time spent: 17 minutes  [place] [employer] [phone]. If 7PM-7AM, please contact night-coverage at [email or web address], password [facility] 07/24/2017, 9:54 AM  LOS:  7 days

## 2017-07-24 NOTE — Evaluation (Signed)
Occupational Therapy Evaluation Patient Details Name: Vincent Dunlap MRN: [id number] DOB: [birth year] Today's Date: 07/24/2017    History of Present Illness Pt is a 76 y.o. male with history significant for hypertension, hyperlipidemia, COPD, GERD, depression, CHF with EF 30-35%, CAD (multivessel disease but not candidate for CABG, chronic thrombocytopenia. He presented with foot pain, chest pain, and dysuria. Found to have osteomyelitis L second toe.    Clinical Impression   PTA, pt reports great difficulty ambulating requiring handheld assistance from friend/roommate and assistance with ADL tasks. He currently requires min assist for LB ADL and toilet transfers and min guard for seated UB ADL without back support. After encouragement, pt willing to participate in ADL and functional mobility in room and was very pleased with his performance reporting that he surprised himself. At current functional level, feel pt would benefit from SNF level rehabilitation post-acute D/C. However, pt would prefer to return home with assistance from friends/roommate. In that case, he will need home health OT follow-up. OT will continue to follow while admitted.    Follow Up Recommendations  SNF;Supervision/Assistance - 24 hour (depending on progress)    Equipment Recommendations  None recommended by OT    Recommendations for Other Services       Precautions / Restrictions Restrictions Weight Bearing Restrictions: No      Mobility Bed Mobility Overal bed mobility: Needs Assistance Bed Mobility: Supine to Sit     Supine to sit: Min assist     General bed mobility comments: Min assist to scoot hips and raise trunk from [facility]  Transfers Overall transfer level: Needs assistance Equipment used: Rolling walker (2 wheeled) Transfers: Sit to/from Stand Sit to Stand: Min assist         General transfer comment: Min lifting and steadying assist.     Balance Overall balance assessment: Needs  assistance Sitting-balance support: No upper extremity supported;Feet supported Sitting balance-Leahy Scale: Fair Sitting balance - Comments: Min guard assist with dynamic sitting tasks.    Standing balance support: Bilateral upper extremity supported;No upper extremity supported Standing balance-Leahy Scale: Poor Standing balance comment: Relies on B UE support                           ADL either performed or assessed with clinical judgement   ADL Overall ADL's : Needs assistance/impaired Eating/Feeding: Set up;Sitting   Grooming: Set up;Sitting   Upper Body Bathing: Sitting;Min guard   Lower Body Bathing: Minimal assistance;Sit to/from stand   Upper Body Dressing : Sitting;Min guard   Lower Body Dressing: Minimal assistance;Sit to/from stand   Toilet Transfer: Minimal assistance;Ambulation;RW Toilet Transfer Details (indicate cue type and reason): Pt impulsive at times with RW.  Toileting- Clothing Manipulation and Hygiene: Minimal assistance;Sit to/from stand       Functional mobility during ADLs: Minimal assistance;Rolling walker General ADL Comments: Pt with much improved spirit following ADL participation in room.      Vision   Vision Assessment?: No apparent visual deficits     Perception     Praxis      Pertinent Vitals/Pain Pain Assessment: Faces Faces Pain Scale: Hurts little more Pain Location: L foot with ambulation Pain Descriptors / Indicators: Aching;Discomfort Pain Intervention(s): Monitored during session;Repositioned     Hand Dominance     Extremity/Trunk Assessment Upper Extremity Assessment Upper Extremity Assessment: Generalized weakness;RUE deficits/detail;LUE deficits/detail RUE Deficits / Details: Pain in shoulder limiting AROM.  LUE Deficits / Details: Pain in shoulder limiting  AROM   Lower Extremity Assessment Lower Extremity Assessment: Defer to PT evaluation       Communication Communication Communication: Other  (comment) (slurred speech)   Cognition Arousal/Alertness: Awake/alert Behavior During Therapy: WFL for tasks assessed/performed Overall Cognitive Status: No family/caregiver present to determine baseline cognitive functioning                                 General Comments: Pt with decreased insight concerning deficits and their impact on his safety and independence.    General Comments       Exercises     Shoulder Instructions      Home Living Family/patient expects to be discharged to:: Private residence Living Arrangements: Other (Comment) (friend) Available Help at Discharge: Friend(s);Available 24 hours/day Type of Home: House Home Access: Stairs to enter [employer] of Steps: 2 Entrance Stairs-Rails: Right Home Layout: One level     Bathroom Shower/Tub: Tub/shower unit         Home Equipment: Shower seat;Grab bars - tub/shower;Walker - 2 wheels;Cane - single point;Bedside commode;Crutches   Additional Comments: Reports friend has an alcohol problem      Prior Functioning/Environment Level of Independence: Needs assistance  Gait / Transfers Assistance Needed: Friend assisting with handheld assist for all ambulation.  ADL's / Homemaking Assistance Needed: Friend assists with ADL as needed.             OT Problem List: Decreased strength;Decreased range of motion;Decreased activity tolerance;Impaired balance (sitting and/or standing);Decreased cognition;Decreased safety awareness;Decreased knowledge of use of DME or AE;Decreased knowledge of precautions;Pain;Impaired UE functional use      OT Treatment/Interventions: Self-care/ADL training;Therapeutic exercise;Energy conservation;DME and/or AE instruction;Therapeutic activities;Patient/family education;Balance training    OT Goals(Current goals can be found in the care plan section) Acute Rehab OT Goals Patient Stated Goal: to go home  OT Goal Formulation: With patient Time For Goal  Achievement: 07/31/17 Potential to Achieve Goals: Good ADL Goals Pt Will Perform Grooming: with supervision;standing Pt Will Perform Upper Body Dressing: sitting;with supervision (including gathering items) Pt Will Perform Lower Body Dressing: with supervision;sit to/from stand Pt Will Transfer to Toilet: with supervision;ambulating;bedside commode (BSC over toilet) Pt Will Perform Toileting - Clothing Manipulation and hygiene: with supervision;sit to/from stand Pt Will Perform Tub/Shower Transfer: Tub transfer;with min guard assist;ambulating;rolling walker;shower seat  OT Frequency: Min 2X/week   Barriers to D/C:            Co-evaluation              AM-PAC PT "6 Clicks" Daily Activity     Outcome Measure Help from another person eating meals?: A Little Help from another person taking care of personal grooming?: A Little Help from another person toileting, which includes using toliet, bedpan, or urinal?: A Little Help from another person bathing (including washing, rinsing, drying)?: A Little Help from another person to put on and taking off regular upper body clothing?: A Little Help from another person to put on and taking off regular lower body clothing?: A Little 6 Click Score: 18   End of Session Equipment Utilized During Treatment: Rolling walker Nurse Communication: Mobility status  Activity Tolerance: Patient tolerated treatment well Patient left: in chair;with call bell/phone within reach  OT Visit Diagnosis: Unsteadiness on feet (R26.81);Muscle weakness (generalized) (M62.81);Pain Pain - Right/Left: Left Pain - part of body: Ankle and joints of foot (toe)  Time: 4932-4199 OT Time Calculation (min): 30 min Charges:  OT General Charges $OT Visit: 1 Visit OT Evaluation $OT Eval Moderate Complexity: 1 Mod OT Treatments $Self Care/Home Management : 8-22 mins G-Codes:     Norman Herrlich, MS OTR/L  Pager: [place]  Demone Lyles 07/24/2017, 4:18 PM

## 2017-07-25 LAB — GLUCOSE, CAPILLARY: Glucose-Capillary: 92 mg/dL (ref 65–99)

## 2017-07-25 LAB — CBC
HEMATOCRIT: 28 % — AB (ref 39.0–52.0)
Hemoglobin: 9.1 g/dL — ABNORMAL LOW (ref 13.0–17.0)
MCH: 24.9 pg — ABNORMAL LOW (ref 26.0–34.0)
MCHC: 32.5 g/dL (ref 30.0–36.0)
MCV: 76.7 fL — AB (ref 78.0–100.0)
Platelets: 156 10*3/uL (ref 150–400)
RBC: 3.65 MIL/uL — AB (ref 4.22–5.81)
RDW: 18.5 % — ABNORMAL HIGH (ref 11.5–15.5)
WBC: 10.5 10*3/uL (ref 4.0–10.5)

## 2017-07-25 LAB — BASIC METABOLIC PANEL
Anion gap: 7 (ref 5–15)
BUN: 16 mg/dL (ref 6–20)
CALCIUM: 8.7 mg/dL — AB (ref 8.9–10.3)
CO2: 23 mmol/L (ref 22–32)
Chloride: 103 mmol/L (ref 101–111)
Creatinine, Ser: 1.02 mg/dL (ref 0.61–1.24)
GFR calc Af Amer: >60 mL/min (ref >60)
GLUCOSE: 89 mg/dL (ref 65–99)
Potassium: 4 mmol/L (ref 3.5–5.1)
Sodium: 133 mmol/L — ABNORMAL LOW (ref 135–145)

## 2017-07-25 NOTE — Evaluation (Signed)
Physical Therapy Evaluation Patient Details Name: Vincent Dunlap MRN: [id number] DOB: Dec 19, 1940 Today's Date: 07/25/2017   History of Present Illness  Pt is a 76 y.o. male with history significant for hypertension, hyperlipidemia, COPD, GERD, depression, CHF with EF 30-35%, CAD (multivessel disease but not candidate for CABG, chronic thrombocytopenia. He presented with foot pain, chest pain, and dysuria. Found to have osteomyelitis L second toe.   Clinical Impression  Pt admitted with above diagnosis. Pt currently with functional limitations due to the deficits listed below (see PT Problem List). Pt is supervision for bed mobility, minA for transfers and ambulation of 18 feet with RW. Pt mobility limited by R foot pain. Pt would benefit from SNF rehab to progress his mobility, however pt more inclined to return home in which case HHPT would be warranted. Pt will benefit from skilled PT to increase their independence and safety with mobility to allow discharge to the venue listed below.       Follow Up Recommendations SNF    Equipment Recommendations  Rolling walker with 5" wheels    Recommendations for Other Services       Precautions / Restrictions Restrictions Weight Bearing Restrictions: No      Mobility  Bed Mobility Overal bed mobility: Needs Assistance Bed Mobility: Supine to Sit     Supine to sit: HOB elevated;Supervision     General bed mobility comments: supervision for safety able to  utilize bedrails to come to EoB  Transfers Overall transfer level: Needs assistance Equipment used: Rolling walker (2 wheeled) Transfers: Sit to/from Stand Sit to Stand: Min assist;+2 physical assistance         General transfer comment: minA for power up, able to steay in RW  Ambulation/Gait Ambulation/Gait assistance: Min assist Ambulation Distance (Feet): 18 Feet Assistive device: Rolling walker (2 wheeled) Gait Pattern/deviations: Step-to pattern;Decreased stance time -  right;Decreased step length - left;Shuffle;Trunk flexed;Decreased weight shift to right;Antalgic Gait velocity: slowed Gait velocity interpretation: Below normal speed for age/gender General Gait Details: minA for steadying, slow antalgic gait with decreased R sided weightbearing       Balance Overall balance assessment: Needs assistance Sitting-balance support: No upper extremity supported;Feet supported Sitting balance-Leahy Scale: Fair Sitting balance - Comments: able to sit EoB without UE support   Standing balance support: Bilateral upper extremity supported;No upper extremity supported Standing balance-Leahy Scale: Poor Standing balance comment: Relies on B UE support                             Pertinent Vitals/Pain Pain Assessment: 0-10 Pain Score: 6  Pain Location: L foot with ambulation Pain Descriptors / Indicators: Aching;Discomfort Pain Intervention(s): Monitored during session;Limited activity within patient's tolerance    Home Living Family/patient expects to be discharged to:: Private residence Living Arrangements: Other (Comment) (friend) Available Help at Discharge: Friend(s);Available 24 hours/day Type of Home: House Home Access: Stairs to enter Entrance Stairs-Rails: Right Entrance Stairs-Number of Steps: 2 Home Layout: One level Home Equipment: Shower seat;Grab bars - tub/shower;Walker - 2 wheels;Cane - single point;Bedside commode;Crutches Additional Comments: Reports friend has an alcohol problem    Prior Function Level of Independence: Needs assistance   Gait / Transfers Assistance Needed: Friend assisting with handheld assist for all ambulation.   ADL's / Homemaking Assistance Needed: Friend assists with ADL as needed.            Extremity/Trunk Assessment   Upper Extremity Assessment Upper Extremity Assessment: Defer to  OT evaluation RUE Deficits / Details: Pain in shoulder limiting AROM.  LUE Deficits / Details: Pain in  shoulder limiting AROM    Lower Extremity Assessment Lower Extremity Assessment: RLE deficits/detail;LLE deficits/detail RLE Deficits / Details: Right 2nd toe osteomylitis, decreased ankle ROM, ankle, knee and hip strength grossly 3/5 RLE Sensation: decreased light touch (in foot) LLE Deficits / Details: generalized weakness LLE Sensation: decreased light touch (in foot)    Cervical / Trunk Assessment Cervical / Trunk Assessment: Kyphotic  Communication   Communication: Other (comment) (slurred speech)  Cognition Arousal/Alertness: Awake/alert Behavior During Therapy: WFL for tasks assessed/performed Overall Cognitive Status: No family/caregiver present to determine baseline cognitive functioning                                 General Comments: Pt with decreased insight concerning deficits and their impact on his safety and independence.       General Comments General comments (skin integrity, edema, etc.): VSS        Assessment/Plan    PT Assessment Patient needs continued PT services  PT Problem List Decreased strength;Decreased range of motion;Decreased activity tolerance;Decreased balance;Decreased mobility;Impaired sensation;Pain       PT Treatment Interventions DME instruction;Gait training;Stair training;Functional mobility training;Therapeutic activities;Therapeutic exercise;Balance training;Patient/family education    PT Goals (Current goals can be found in the Care Plan section)  Acute Rehab PT Goals Patient Stated Goal: to go home  PT Goal Formulation: With patient Time For Goal Achievement: 08/08/17 Potential to Achieve Goals: Good    Frequency Min 3X/week    AM-PAC PT "6 Clicks" Daily Activity  Outcome Measure Difficulty turning over in bed (including adjusting bedclothes, sheets and blankets)?: A Lot Difficulty moving from lying on back to sitting on the side of the bed? : A Lot Difficulty sitting down on and standing up from a chair  with arms (e.g., wheelchair, bedside commode, etc,.)?: Unable Help needed moving to and from a bed to chair (including a wheelchair)?: A Little Help needed walking in hospital room?: A Little Help needed climbing 3-5 steps with a railing? : A Lot 6 Click Score: 13    End of Session Equipment Utilized During Treatment: Gait belt Activity Tolerance: Patient tolerated treatment well Patient left: in chair;with call bell/phone within reach;with chair alarm set Nurse Communication: Mobility status;Other (comment) (request for Darco Seelbach) PT Visit Diagnosis: Unsteadiness on feet (R26.81);Other abnormalities of gait and mobility (R26.89);Muscle weakness (generalized) (M62.81);Difficulty in walking, not elsewhere classified (R26.2);Pain Pain - Right/Left: Right Pain - part of body: Ankle and joints of foot    Time: 0950-1009 PT Time Calculation (min) (ACUTE ONLY): 19 min   Charges:   PT Evaluation $PT Eval Moderate Complexity: 1 Mod     PT G Codes:        Valinda Fedie B. Migdalia Dk PT, DPT Acute Rehabilitation  [phone] Pager [phone]    [place] 07/25/2017, 10:27 AM

## 2017-07-25 NOTE — Progress Notes (Signed)
Initial Nutrition Assessment  DOCUMENTATION CODES:   Not applicable  INTERVENTION:   -Continue Ensure Enlive po BID, each supplement provides 350 kcal and 20 grams of protein  NUTRITION DIAGNOSIS:   Increased nutrient needs related to acute illness, wound healing as evidenced by estimated needs.  Being addressed via supplementation  GOAL:   Patient will meet greater than or equal to 90% of their needs  Progressing  MONITOR:   PO intake, Supplement acceptance, Labs, Weight trends  REASON FOR ASSESSMENT:   Malnutrition Screening Tool    ASSESSMENT:   76 yo male admitted with sepsis due to osteomyelitis and UTI.  Pt wit hx of HTN, HLD, COPD, GERD, CHF EF 30-35%, CAD  Plan for arteriography this week, possible revascularizations  Recorded po intake 86% of meals. Pt reports good appetite, drinking supplements well  Constipation, on bowel regimen  Labs: sodium 133 Meds: MVI  Diet Order:  Diet Heart Room service appropriate? Yes; Fluid consistency: Thin  Skin:  Wound (see comment) (osteomyelitis, unstageable toe, stage II sacrum)  Last BM:  9/11  Height:   Ht Readings from Last 1 Encounters:  07/17/17 5\' 11"  (1.803 m)    Weight:   Wt Readings from Last 1 Encounters:  07/25/17 156 lb 12.8 oz (71.1 kg)    Ideal Body Weight:     BMI:  Body mass index is 21.87 kg/m.  Estimated Nutritional Needs:   Kcal:  1800-2000 kcals  Protein:  105-140 g  Fluid:  >/= 1.8 L  EDUCATION NEEDS:   No education needs identified at this time  [place], [place], LDN [phone] Pager  [phone] Weekend/On-Call Pager

## 2017-07-25 NOTE — Clinical Social Work Note (Signed)
CSW met with patient to discuss SNF placement. Patient refusing at this time. He states he was at [facility] about a year ago and stayed the one week only because he promised the SNF MD that he would. Patient eventually plans to move into a facility long-term but wants to get his bills, house, etc squared away before he does so. SNF list provided. He is agreeable to HHPT. Patient reports that his friend lives with him and he is never alone. RNCM notified.  CSW signing off. Consult again if any other social work needs arise.   , CSW [phone]  

## 2017-07-25 NOTE — Progress Notes (Signed)
[facility] PROGRESS NOTE  Vincent Dunlap ZOX:[id number] DOB: 04-May-1941 DOA: 07/16/2017 PCP: Thressa Sheller, MD  Interim summary and history of present illness 76 y.o. male with medical history significant of hypertension, hyperlipidemia, COPD, GERD, depression, formal smoker, sCHF with EF of 30-35%, CAD (multivessel disease, but not a candidate for CABG), chronic thrombocytopenia, who presents with foot pain, chest pain, and dysuria. Patient found to have sepsis most likely due to UTI and osteomyelitis on his right second toe.  Assessment/Plan: 1-sepsis secondary to osteomyelitis and UTI -Patient responded well to IV fluid resuscitation and the use of broad-spectrum antibiotics -overall hemodynamically stable at this time -Will follow results from blood cultures and urine culture -Continue vanc and zosyn -Assessment of vasculature integrity on his right leg with ABI severe reduction in arterial flow on right and moderate reduction on left.    -orthopedic service on board and will follow their recs -Vascular surgery consult requested: Dr. Donnetta Hutching saw him and because he has severe coronary disease and is not a candidate for revascularization. Explained that his only option would be endovascular. They are planning arteriography this week and possible intervention.  Encouraged ambulation and PT evaluation.  2-intermittent chest discomfort in patient with history of coronary artery disease: Patient had a history of known coronary artery disease (multiple vessels) found not a candidate for CABG. -He has been experiencing stable angina for years -Continue conservative management with the use of aspirin, statins, beta blocker and imdur.  -Cardiology has seen patient and recommended no further ischemic workup at this moment. -neg troponin X3; no CP  3-history of COPD -No wheezing and with good air movement bilaterally  -Will continue as needed albuterol nebulizer -cessation counseling  ongoing  4-chronic idiopathic thrombocytopenia -No signs of acute bleeding -Will continue monitoring platelets, stable to improved.  5-tobacco abuse -Cessation counseling provided -Nicotine patch has been ordered  6-hypertension -BP soft and low especially when changing positions -following cardiology recs, on coreg, lisinopril, imdur  7-hyperlipidemia -will continue lipitor 80 mg daily  8-stage 2 sacral pressure injury -continue preventive measures -overlay mattress ordered   9-mild hyponatremia: from dehydration  -will continue gentle IVFs -patient able to eat and drink well now -will monitor electrolytes   Code Status: Full code Family Communication: None at bedside Disposition Plan: To be determined.  Consultants:  Neurology service  Orthopedic service  Vascular surgery service  Procedures:  See below for x-ray reports   2-D echo - Procedure narrative: Transthoracic echocardiography. Image   quality was suboptimal. The study was technically difficult, as a  result of poor acoustic windows, poor sound wave transmission,  and body habitus. - Left ventricle: The cavity size was normal. Systolic function was normal. The estimated ejection fraction was 55%. Doppler parameters are consistent with abnormal left ventricular relaxation (grade 1 diastolic dysfunction). - Regional wall motion abnormality: Hypokinesis of the basal inferior myocardium. - Aortic valve: Mildly thickened, mildly calcified leaflets. There was mild regurgitation. - Mitral valve: Mildly thickened leaflets . There was mild to moderate regurgitation. - Right ventricle: Systolic function was mildly reduced. - Tricuspid valve: There was mild regurgitation.   Right lower extremity ABI  Antibiotics:  Vancomycin and Zosyn 9/8  HPI/Subjective: Pt worried about his right foot.  He otherwise has been stable.      Objective: Vitals:   07/24/17 2058 07/25/17 0426  BP: (!) 115/58 123/74  Pulse:  70 73  Resp: 20 18  Temp: 97.7 F (36.5 C) 97.9 F (36.6 C)  SpO2: 98%  97%    Intake/Output Summary (Last 24 hours) at 07/25/17 1014 Last data filed at 07/25/17 0857  Gross per 24 hour  Intake             1640 ml  Output             2675 ml  Net            -1035 ml   Filed Weights   07/23/17 0545 07/24/17 0542 07/25/17 0426  Weight: 70.2 kg (154 lb 12.8 oz) 71.3 kg (157 lb 1.6 oz) 71.1 kg (156 lb 12.8 oz)    Exam:   General:  Afebrile, no CP, no nausea, no vomiting.  NAD.   Cardiovascular: S1 and S2, no rubs, no gallops, no JVD.  Respiratory: normal resp effort, no wheezing, no crackles   Abdomen: soft, NT, ND, positive BS  Musculoskeletal: trace edema bilaterally; mild erythematous changes in dorsal aspect of his foot, right second toe with necrotic tip. Patient decrease pedal and popliteal on RLE. Wound clean and dry.  Skin: stage 2 sacral pressure injury, no superimposed infection  Data Reviewed: Basic Metabolic Panel:  Recent Labs Lab 07/20/17 0743 07/21/17 0411 07/22/17 0429 07/23/17 0553 07/25/17 0404  NA 134* 133* 132* 132* 133*  K 4.0 4.1 4.3 4.0 4.0  CL 104 103 104 104 103  CO2 22 25 23 22 23   GLUCOSE 91 94 93 85 89  BUN 14 11 13 12 16   CREATININE 0.98 1.06 1.10 0.91 1.02  CALCIUM 8.5* 8.3* 8.7* 8.7* 8.7*   CBC:  Recent Labs Lab 07/20/17 0743 07/21/17 0411 07/22/17 0429 07/23/17 0553 07/25/17 0404  WBC 11.0* 12.4* 11.7* 11.7* 10.5  HGB 9.9* 9.2* 9.4* 9.8* 9.1*  HCT 29.7* 28.3* 28.8* 29.7* 28.0*  MCV 76.7* 77.3* 76.8* 76.9* 76.7*  PLT 91* 121* 146* 143* 156   Cardiac Enzymes: No results for input(s): CKTOTAL, CKMB, CKMBINDEX, TROPONINI in the last 168 hours. BNP (last 3 results)  Recent Labs  07/17/17 0317  BNP 145.4*   CBG:  Recent Labs Lab 07/20/17 0628 07/21/17 0547 07/22/17 0536 07/24/17 0652 07/25/17 0606  GLUCAP 98 80 93 116* 92   Studies: No results found.  Scheduled Meds: . aspirin  81 mg Oral Daily  .  atorvastatin  80 mg Oral q1800  . carvedilol  6.25 mg Oral BID WC  . feeding supplement (ENSURE ENLIVE)  237 mL Oral BID BM  . isosorbide mononitrate  30 mg Oral Q24H  . lisinopril  2.5 mg Oral Daily  . multivitamin with minerals  1 tablet Oral Daily  . nicotine  14 mg Transdermal Daily  . polyethylene glycol  17 g Oral BID  . senna-docusate  1 tablet Oral QHS   Continuous Infusions: . piperacillin-tazobactam (ZOSYN)  IV Stopped (07/25/17 1010)  . vancomycin 750 mg (07/25/17 3810)    Principal Problem:   Osteomyelitis of toe of right foot ([place]) Active Problems:   CAD (coronary artery disease), native coronary artery   Chronic systolic CHF (congestive heart failure) (HCC)   COPD (chronic obstructive pulmonary disease) (H[facility])   Chronic idiopathic thrombocytopenia ([facility])   UTI (urinary tract infection)   Sepsis (HCC)   Hypotension   Chest pain   Pressure injury of skin  Time spent: 16 minutes  [place] [employer] [phone]. If 7PM-7AM, please contact night-coverage at [email or web address], password [facility] 07/25/2017, 10:14 AM  LOS: 8 days

## 2017-07-26 ENCOUNTER — Encounter (HOSPITAL_COMMUNITY)
Admission: EM | Disposition: A | Discharge status: Home Health | Payer: Self-pay | Source: Home / Self Care | Attending: Family Medicine

## 2017-07-26 ENCOUNTER — Ambulatory Visit (HOSPITAL_COMMUNITY): Admission: RE | Admit: 2017-07-26 | Payer: Medicare Other | Source: Ambulatory Visit | Admitting: Surgery

## 2017-07-26 DIAGNOSIS — M869 Osteomyelitis, unspecified: Secondary | ICD-10-CM

## 2017-07-26 HISTORY — PX: PERIPHERAL VASCULAR INTERVENTION: CATH118257

## 2017-07-26 HISTORY — PX: ABDOMINAL AORTOGRAM W/LOWER EXTREMITY: CATH118223

## 2017-07-26 LAB — CBC
HEMATOCRIT: 29.4 % — AB (ref 39.0–52.0)
HEMOGLOBIN: 9.6 g/dL — AB (ref 13.0–17.0)
MCH: 25 pg — AB (ref 26.0–34.0)
MCHC: 32.7 g/dL (ref 30.0–36.0)
MCV: 76.6 fL — AB (ref 78.0–100.0)
Platelets: 149 10*3/uL — ABNORMAL LOW (ref 150–400)
RBC: 3.84 MIL/uL — AB (ref 4.22–5.81)
RDW: 18.5 % — ABNORMAL HIGH (ref 11.5–15.5)
WBC: 10.9 10*3/uL — ABNORMAL HIGH (ref 4.0–10.5)

## 2017-07-26 LAB — POCT ACTIVATED CLOTTING TIME: ACTIVATED CLOTTING TIME: 175 s

## 2017-07-26 LAB — BASIC METABOLIC PANEL
Anion gap: 8 (ref 5–15)
BUN: 14 mg/dL (ref 6–20)
CALCIUM: 9 mg/dL (ref 8.9–10.3)
CHLORIDE: 105 mmol/L (ref 101–111)
CO2: 21 mmol/L — AB (ref 22–32)
CREATININE: 0.97 mg/dL (ref 0.61–1.24)
GFR calc Af Amer: >60 mL/min (ref >60)
GLUCOSE: 94 mg/dL (ref 65–99)
POTASSIUM: 3.8 mmol/L (ref 3.5–5.1)
Sodium: 134 mmol/L — ABNORMAL LOW (ref 135–145)

## 2017-07-26 LAB — PROTIME-INR
INR: 1.09
PROTHROMBIN TIME: 14 s (ref 11.4–15.2)

## 2017-07-26 SURGERY — ABDOMINAL AORTOGRAM W/LOWER EXTREMITY
Anesthesia: LOCAL | Laterality: Right

## 2017-07-26 MED ORDER — HEPARIN (PORCINE) IN NACL 2-0.9 UNIT/ML-% IJ SOLN
INTRAMUSCULAR | Status: AC | PRN
  Administered 2017-07-26: 1000 mL via INTRA_ARTERIAL
  Administered 2017-07-26: 500 mL via INTRA_ARTERIAL

## 2017-07-26 MED ORDER — FENTANYL CITRATE (PF) 100 MCG/2ML IJ SOLN
INTRAMUSCULAR | Status: DC | PRN
  Administered 2017-07-26 (×4): 25 ug via INTRAVENOUS

## 2017-07-26 MED ORDER — ACETAMINOPHEN 325 MG PO TABS: 650.0000 mg | ORAL_TABLET | ORAL | Status: DC | PRN

## 2017-07-26 MED ORDER — IODIXANOL 320 MG/ML IV SOLN
INTRAVENOUS | Status: DC | PRN
  Administered 2017-07-26: 130 mL via INTRA_ARTERIAL

## 2017-07-26 MED ORDER — HEPARIN SODIUM (PORCINE) 1000 UNIT/ML IJ SOLN
INTRAMUSCULAR | Status: AC
  Filled 2017-07-26: qty 1

## 2017-07-26 MED ORDER — SODIUM CHLORIDE 0.9 % IV SOLN: 250.0000 mL | INTRAVENOUS | Status: DC | PRN

## 2017-07-26 MED ORDER — HEPARIN (PORCINE) IN NACL 2-0.9 UNIT/ML-% IJ SOLN
INTRAMUSCULAR | Status: AC
  Filled 2017-07-26: qty 1500

## 2017-07-26 MED ORDER — SODIUM CHLORIDE 0.9% FLUSH
3.0000 mL | Freq: Two times a day (BID) | INTRAVENOUS | Status: DC
  Administered 2017-07-26 – 2017-07-30 (×6): 3 mL via INTRAVENOUS

## 2017-07-26 MED ORDER — MIDAZOLAM HCL 2 MG/2ML IJ SOLN
INTRAMUSCULAR | Status: DC | PRN
  Administered 2017-07-26 (×4): 1 mg via INTRAVENOUS

## 2017-07-26 MED ORDER — LIDOCAINE HCL (PF) 2 % IJ SOLN
INTRAMUSCULAR | Status: DC | PRN
  Administered 2017-07-26: 10 mL via INTRADERMAL

## 2017-07-26 MED ORDER — LIDOCAINE HCL 2 % IJ SOLN
INTRAMUSCULAR | Status: AC
  Filled 2017-07-26: qty 10

## 2017-07-26 MED ORDER — FENTANYL CITRATE (PF) 100 MCG/2ML IJ SOLN
INTRAMUSCULAR | Status: AC
  Filled 2017-07-26: qty 2

## 2017-07-26 MED ORDER — MIDAZOLAM HCL 2 MG/2ML IJ SOLN
INTRAMUSCULAR | Status: AC
  Filled 2017-07-26: qty 2

## 2017-07-26 MED ORDER — SODIUM CHLORIDE 0.9 % IV SOLN: INTRAVENOUS | Status: AC

## 2017-07-26 MED ORDER — HYDRALAZINE HCL 20 MG/ML IJ SOLN: 5.0000 mg | INTRAMUSCULAR | Status: DC | PRN

## 2017-07-26 MED ORDER — HEPARIN SODIUM (PORCINE) 1000 UNIT/ML IJ SOLN
INTRAMUSCULAR | Status: DC | PRN
  Administered 2017-07-26: 5000 [IU] via INTRAVENOUS

## 2017-07-26 MED ORDER — ONDANSETRON HCL 4 MG/2ML IJ SOLN: 4.0000 mg | Freq: Four times a day (QID) | INTRAMUSCULAR | Status: DC | PRN

## 2017-07-26 MED ORDER — LABETALOL HCL 5 MG/ML IV SOLN: 10.0000 mg | INTRAVENOUS | Status: DC | PRN

## 2017-07-26 MED ORDER — SODIUM CHLORIDE 0.9% FLUSH: 3.0000 mL | INTRAVENOUS | Status: DC | PRN

## 2017-07-26 SURGICAL SUPPLY — 24 items
CATH OMNI FLUSH 5F 65CM (CATHETERS) ×2 IMPLANT
CATH QUICKCROSS SUPP .035X90CM (MICROCATHETER) ×2 IMPLANT
CATH SOFT-VU 4F 65 STRAIGHT (CATHETERS) ×1 IMPLANT
CATH SOFT-VU STRAIGHT 4F 65CM (CATHETERS) ×1
CATH SOFTOUCH MOTARJEME 5F (CATHETERS) ×2 IMPLANT
COVER PRB 48X5XTLSCP FOLD TPE (BAG) ×1 IMPLANT
COVER PROBE 5X48 (BAG) ×1
DEVICE TORQUE H2O (MISCELLANEOUS) ×2 IMPLANT
GUIDEWIRE ANGLED .035X150CM (WIRE) ×2 IMPLANT
KIT MICROINTRODUCER STIFF 5F (SHEATH) ×2 IMPLANT
KIT PV (KITS) ×2 IMPLANT
PINNACLE LONG 7F 25CM (SHEATH) ×2
SHEATH FLEX ANSEL ANG 5F 45CM (SHEATH) ×2 IMPLANT
SHEATH HIGHFLEX ANSEL 6FRX55 (SHEATH) ×2 IMPLANT
SHEATH HIGHFLEX ANSEL 7FR 55CM (SHEATH) ×2 IMPLANT
SHEATH INTRO PINNACLE 7F 25CM (SHEATH) ×1 IMPLANT
SHEATH PINNACLE 5F 10CM (SHEATH) ×2 IMPLANT
SYR MEDRAD MARK V 150ML (SYRINGE) ×2 IMPLANT
TRANSDUCER W/STOPCOCK (MISCELLANEOUS) ×2 IMPLANT
TRAY PV CATH (CUSTOM PROCEDURE TRAY) ×2 IMPLANT
WIRE AMPLATZ SS-J .035X180CM (WIRE) ×2 IMPLANT
WIRE BENTSON .035X145CM (WIRE) ×2 IMPLANT
WIRE ROSEN-J .035X180CM (WIRE) ×2 IMPLANT
WIRE ROSEN-J .035X260CM (WIRE) ×2 IMPLANT

## 2017-07-26 NOTE — H&P (View-Only) (Signed)
Hospital Consult    Reason for Consult:  Non healing toe ulcer Requesting Physician:  Wynetta Emery MRN #:  [id number]  History of Present Illness: This is a 76 y.o. male who states that he was at home and his friend had a friend that is a nurse come check on him.  He states that he was having chest pain and she looked at his foot and took his blood pressure, which was low and at that time, 911 was called and he was brought to the hospital and admitted.   He is on IV abx.  He states that he has had this wound on the 2nd left toe for about a year.  He states that he has pain with it and has trouble sleeping because of it.  He states he has been soaking it in hot water.  He has also had burning with urination. He states that he has also had dizziness.    He has not been able to walk bc of the pain.  He has a friend that stays with him to help take care of him.    He has known 3V CAD and EF of 30%.  He is on asa/statin/beta blocker/imdur.  Cardiology has been consulted.  He had negative cardiac enzymes and no further chest pain.  No further workup at this time.  He does smoke about 3/4 ppd and has for most of his life.  Past Medical History:  Diagnosis Date  . CAD (coronary artery disease) 07/2016   3 vessel disease,  evaluated by Dr Servando Snare and felt to be a poor surgical candidate  . Hypertension   . Ischemic cardiomyopathy 2017   EF 30%  . Tobacco abuse     Past Surgical History:  Procedure Laterality Date  . CARDIAC CATHETERIZATION N/A 07/19/2016   Procedure: Right/Left Heart Cath and Coronary Angiography;  Surgeon: Troy Sine, MD;  Location: [facility];  Service: Cardiovascular;  Laterality: N/A;  . INGUINAL HERNIA REPAIR Bilateral     Allergies  Allergen Reactions  . Chantix [Varenicline] Other (See Comments)    Pt states it made him crazy    Prior to Admission medications   Medication Sig Start Date End Date Taking? Authorizing Provider  amLODipine-benazepril  (LOTREL) 5-20 MG capsule Take 1 capsule by mouth daily.   Yes [provider]  atorvastatin (LIPITOR) 80 MG tablet Take 1 tablet (80 mg total) by mouth daily at 6 PM. 07/25/16  Yes Regalado, Belkys A, MD  carvedilol (COREG) 6.25 MG tablet Take 1 tablet (6.25 mg total) by mouth 2 (two) times daily with a meal. 07/25/16  Yes Regalado, Belkys A, MD  lisinopril (PRINIVIL,ZESTRIL) 2.5 MG tablet Take 1 tablet (2.5 mg total) by mouth daily. 07/26/16  Yes Regalado, Belkys A, MD  nitroGLYCERIN (NITROSTAT) 0.4 MG SL tablet Place 1 tablet (0.4 mg total) under the tongue every 5 (five) minutes as needed for chest pain. 07/25/16  Yes Regalado, Belkys A, MD  spironolactone (ALDACTONE) 25 MG tablet Take 0.5 tablets (12.5 mg total) by mouth daily. 07/26/16  Yes Regalado, Cassie Freer, MD    Social History   Social History  . Marital status: Single    Spouse name: N/A  . Number of children: N/A  . Years of education: N/A   Occupational History  . Not on file.   Social History Main Topics  . Smoking status: Current Every Day Smoker  . Smokeless tobacco: Former Systems developer     Comment: 1  pack per day ages [age]  . Alcohol use No     Comment: Former heavy binge drinker until 2014  . Drug use: No  . Sexual activity: Not on file   Other Topics Concern  . Not on file   Social History Narrative  . No narrative on file     Family History  Problem Relation Age of Onset  . Alzheimer's disease Mother   . Diabetes Mother        Diabetes in the maternal side of the family but not in mother  . Heart disease Neg Hx   . Cancer Neg Hx     ROS: [x]  Positive   [ ]  Negative   [ ]  All sytems reviewed and are negative  Cardiac: [x]  chest pain/pressure []  palpitations []  SOB lying flat []  DOE  Vascular: []  pain in legs while walking [x]  pain in legs at rest [x]  pain in legs at night [x]  non-healing ulcers []  hx of DVT []  swelling in legs  Pulmonary: []  productive cough []  asthma/wheezing []  home  O2  Neurologic: []  weakness in []  arms []  legs []  numbness in []  arms []  legs []  hx of CVA []  mini stroke [] difficulty speaking or slurred speech []  temporary loss of vision in one eye []  dizziness  Hematologic: []  hx of cancer []  bleeding problems []  problems with blood clotting easily  Endocrine:   []  diabetes []  thyroid disease  GI []  vomiting blood []  blood in stool  GU: []  CKD/renal failure []  HD--[]  M/W/F or []  T/T/S []  burning with urination []  blood in urine  Psychiatric: []  anxiety []  depression  Musculoskeletal: []  arthritis []  joint pain  Integumentary: []  rashes []  ulcers  Constitutional: []  fever []  chills   Physical Examination  Vitals:   07/22/17 0454 07/22/17 1140  BP: 122/79 (!) 123/52  Pulse: 87 69  Resp: 18 18  Temp: 98.1 F (36.7 C) 97.7 F (36.5 C)  SpO2: 97% 99%   Body mass index is 21.99 kg/m.  General:  WDWN in NAD Gait: Not observed HENT: WNL, normocephalic Pulmonary: normal non-labored breathing, without Rales, rhonchi,  wheezing Cardiac: regular, without  Murmurs, rubs or gallops; without carotid bruits Abdomen:  soft, NT/ND, no masses Skin: without rashes Vascular Exam/Pulses:  Right Left  Radial 2+ (normal) 2+ (normal)  Ulnar 2+ (normal) 2+ (normal)  Femoral trace 2+ (normal)  Popliteal Unable to palpate  Unable to palpate   DP + monophasic AT doppler signal + DP doppler signal  PT Faint doppler signal Absent doppler signal  Peroneal + doppler signal Faint doppler signal   Extremities: without ischemic changes, with Gangrene tip of right 2nd toe that is malodorous , without cellulitis; without open wounds      Musculoskeletal: no muscle wasting or atrophy  Neurologic: A&O X 3;  No focal weakness or paresthesias are detected; speech is fluent/normal Psychiatric:  The pt has Normal affect. Lymph:  No inguinal lymphadenopathy   CBC    Component Value Date/Time   WBC 11.7 (H) 07/22/2017 0429   RBC  3.75 (L) 07/22/2017 0429   HGB 9.4 (L) 07/22/2017 0429   HGB 12.5 (L) 08/30/2012 1348   HCT 28.8 (L) 07/22/2017 0429   HCT 37.4 (L) 08/30/2012 1348   PLT 146 (L) 07/22/2017 0429   PLT 68 (L) 08/30/2012 1348   MCV 76.8 (L) 07/22/2017 0429   MCV 91.2 08/30/2012 1348   MCH 25.1 (L) 07/22/2017 0429   MCHC 32.6 07/22/2017 0429  RDW 18.3 (H) 07/22/2017 0429   RDW 16.0 (H) 08/30/2012 1348   LYMPHSABS 4.1 (H) 07/19/2016 0302   LYMPHSABS 3.3 08/30/2012 1348   MONOABS 1.2 (H) 07/19/2016 0302   MONOABS 0.9 08/30/2012 1348   EOSABS 0.3 07/19/2016 0302   EOSABS 0.2 08/30/2012 1348   BASOSABS 0.0 07/19/2016 0302   BASOSABS 0.0 08/30/2012 1348    BMET    Component Value Date/Time   NA 132 (L) 07/22/2017 0429   NA 135 (L) 08/30/2012 1348   K 4.3 07/22/2017 0429   K 4.5 08/30/2012 1348   CL 104 07/22/2017 0429   CL 103 08/30/2012 1348   CO2 23 07/22/2017 0429   CO2 23 08/30/2012 1348   GLUCOSE 93 07/22/2017 0429   GLUCOSE 107 (H) 08/30/2012 1348   BUN 13 07/22/2017 0429   BUN 10.0 08/30/2012 1348   CREATININE 1.10 07/22/2017 0429   CREATININE 0.8 08/30/2012 1348   CALCIUM 8.7 (L) 07/22/2017 0429   CALCIUM 9.3 08/30/2012 1348   GFRNONAA >60 07/22/2017 0429   GFRAA >60 07/22/2017 0429    COAGS: Lab Results  Component Value Date   INR 1.12 07/17/2017   INR 1.10 07/19/2016   INR 3.86 (H) 08/31/2011     Non-Invasive Vascular Imaging:    RIGHT    LEFT    PRESSURE WAVEFORM  PRESSURE WAVEFORM  BRACHIAL 101 Triphasic BRACHIAL       DP 60 Severely Dampened Monophasic  AT 40 Severely Dampened Monophasic     PT 40 Dampened Monophasic PT 42 Dampened Monophasic    RIGHT LEFT  ABI 0.40 0.59     Statin:  Yes.   Beta Blocker:  Yes.   Aspirin:  Yes.   ACEI:  Yes. ARB:  No. CCB use:  No Other antiplatelets/anticoagulants:  No.    ASSESSMENT/PLAN: This is a 76 y.o. male with non healing wound on the right 2nd toe   -pt with arterial insufficiency BLE with  the right >left.  He has a faintly palpable right femoral pulse.  He will need arteriogram with BLE runoff and possible intervention prior to having toe amputation.  Hopefully, there will be an endovascular intervention as he will be high risk for surgical procedure.  -discussed the importance of smoking cessation with pt -continue abx per primary team.  Leontine Locket, PA-C [facility] [phone]  I have examined the patient, reviewed and agree with above. Diminished right femoral pulse and normal left femoral pulse. Absent popliteal and distal pulses. Do not feel he has adequate flow for healing of his right toe. Explained that although he just has gangrene on the tip of the toe that with his diminished flow he would not be able to heal the toe amputation. Has a severe coronary disease and is not a candidate for revascularization. Explained that his only option would be endovascular. Will plan arteriography Tyra Michelle next week and possible intervention. Will not follow actively. Patient is currently scheduled for arteriogram on Tuesday, 07/26/2017  Curt Jews, MD 07/22/2017 6:13 PM

## 2017-07-26 NOTE — Progress Notes (Addendum)
Site area: LFA Site Prior to Removal:  Level 0 Pressure Applied For:45 min Manual:   yes Patient Status During Pull:  stable Post Pull Site:  Level 0 Post Pull Instructions Given:  yes Post Pull Pulses Present: doppler Dressing Applied:  tegaderm Bedrest begins @ 4383 till 2150 Comments: growth seen post 20 min Bruise is probable.

## 2017-07-26 NOTE — Progress Notes (Signed)
Occupational Therapy Treatment Patient Details Name: Vincent Dunlap MRN: [id number] DOB: 04-21-1941 Today's Date: 07/26/2017    History of present illness Pt is a 76 y.o. male with history significant for hypertension, hyperlipidemia, COPD, GERD, depression, CHF with EF 30-35%, CAD (multivessel disease but not candidate for CABG, chronic thrombocytopenia. He presented with foot pain, chest pain, and dysuria. Found to have osteomyelitis L second toe.    OT comments  Pt is eager to complete medical evaluation and treatment for his osteomyelitis so he can go home. Pt educated at length in home safety and possible hazards. He is highly agreeable to home health services including social work as it is his plan to move to a place where he can receive care. Pt limited by foot pain. Needed cues to slow pace for safety. Updated d/c plan.  Follow Up Recommendations  Home health OT (pt is refusing SNF)    Equipment Recommendations  None recommended by OT    Recommendations for Other Services      Precautions / Restrictions Precautions Precautions: Fall Restrictions Weight Bearing Restrictions: No       Mobility Bed Mobility   Bed Mobility: Supine to Sit;Sit to Supine     Supine to sit: HOB elevated;Supervision Sit to supine: Supervision   General bed mobility comments: supervision for safety able to  utilize bedrails to come to EoB  Transfers Overall transfer level: Needs assistance Equipment used: Rolling walker (2 wheeled) Transfers: Sit to/from Stand Sit to Stand: Min guard;Min assist              Balance Overall balance assessment: Needs assistance   Sitting balance-Leahy Scale: Good       Standing balance-Leahy Scale: Poor Standing balance comment: Relies on B UE support                           ADL either performed or assessed with clinical judgement   ADL Overall ADL's : Needs assistance/impaired     Grooming: Wash/dry hands;Standing;Min guard           Upper Body Dressing : Set up;Sitting   Lower Body Dressing: Min guard;Sitting/lateral leans Lower Body Dressing Details (indicate cue type and reason): pt able to cross foot over opposite knee Toilet Transfer: Minimal assistance;Ambulation Toilet Transfer Details (indicate cue type and reason): steadying assist initially         Functional mobility during ADLs: Minimal assistance;Rolling walker General ADL Comments: Pt has a basket he uses to carry items with his walker and keeps his cell phone in a pouch around his neck at home. He states his roomate is trustworthy to get him groceries. Discussed stabilizing his rocker recliner to make it safer to stand and possible use ot 3 in 1 over toilet at home.     Vision       Perception     Praxis      Cognition Arousal/Alertness: Awake/alert Behavior During Therapy: WFL for tasks assessed/performed Overall Cognitive Status: Within Functional Limits for tasks assessed                                 General Comments: Pt demonstrated good insight into potential issues he could encounter at home and how to remedy        Exercises     Shoulder Instructions       General Comments  Pertinent Vitals/ Pain       Pain Assessment: 0-10 Faces Pain Scale: Hurts even more Pain Location: L foot with ambulation Pain Descriptors / Indicators: Aching;Discomfort  Home Living                                          Prior Functioning/Environment              Frequency  Min 2X/week        Progress Toward Goals  OT Goals(current goals can now be found in the care plan section)  Progress towards OT goals: Progressing toward goals  Acute Rehab OT Goals Patient Stated Goal: to go home  OT Goal Formulation: With patient Time For Goal Achievement: 07/31/17 Potential to Achieve Goals: Good  Plan Discharge plan needs to be updated    Co-evaluation                 AM-PAC  PT "6 Clicks" Daily Activity     Outcome Measure   Help from another person eating meals?: None Help from another person taking care of personal grooming?: A Little Help from another person toileting, which includes using toliet, bedpan, or urinal?: A Little Help from another person bathing (including washing, rinsing, drying)?: A Little Help from another person to put on and taking off regular upper body clothing?: None Help from another person to put on and taking off regular lower body clothing?: A Little 6 Click Score: 20    End of Session Equipment Utilized During Treatment: Gait belt;Rolling walker  OT Visit Diagnosis: Unsteadiness on feet (R26.81);Muscle weakness (generalized) (M62.81);Pain Pain - Right/Left: Left   Activity Tolerance Patient tolerated treatment well   Patient Left in bed;with call bell/phone within reach   Nurse Communication          Time: 2542-7062 OT Time Calculation (min): 23 min  Charges: OT General Charges $OT Visit: 1 Visit OT Treatments $Self Care/Home Management : 23-37 mins  07/26/2017 Nestor Lewandowsky, OTR/L Pager: [phone]   Vincent Dunlap Vincent Dunlap 07/26/2017, 12:33 PM

## 2017-07-26 NOTE — Care Management Important Message (Signed)
Important Message  Patient Details  Name: Vincent Dunlap MRN: [id number] Date of Birth: [birth year]   Medicare Important Message Given:  Yes    Monia Timmers Montine Circle 07/26/2017, 8:29 AM

## 2017-07-26 NOTE — Op Note (Signed)
    Patient name: Vincent Dunlap MRN: [id number] DOB: 1941/04/16 Sex: male  07/16/2017 - 07/26/2017 Pre-operative Diagnosis: Right toe ulcer Post-operative diagnosis:  Same Surgeon:  [name] Procedure Performed:  1.  Ultrasound-guided access, left femoral artery  2.  Abdominal aortogram  3.  Bilateral lower extremity runoff  4.  first order catheterization  5.  Conscious sedation (120 minutes)    Indications:  The patient has a ulcer to the tip of his right second toe in the setting of peripheral vascular disease.  He is here today for further evaluation  Procedure:  The patient was identified in the holding area and taken to room 8.  The patient was then placed supine on the table and prepped and draped in the usual sterile fashion.  A time out was called.  Conscious sedation was administered with the use of IV fentanyl and Versed under continuous physician and nurse monitoring.  Heart rate, blood pressure, and oxygen saturation continuously monitored.  Ultrasound was used to evaluate the left common femoral artery.  It was patent .  A digital ultrasound image was acquired.  A micropuncture needle was used to access the left common femoral artery under ultrasound guidance.  An 018 wire was advanced without resistance and a micropuncture sheath was placed.  The 018 wire was removed and a benson wire was placed.  The micropuncture sheath was exchanged for a 5 french sheath.  An omniflush catheter was advanced over the wire to the level of L-1.  An abdominal angiogram was obtained.  Next, I had difficulty getting catheter access into the right external iliac artery and therefore I used a reverse curve catheter in the right common iliac artery to perform imaging of the right leg. Findings:   Aortogram:  No significant renal artery stenosis.  The infrarenal abdominal aorta is widely patent.  Bilateral common and external iliac arteries widely patent.  Right Lower Extremity:  The right common femoral  and profunda femoral artery are patent throughout their course.  The right superficial femoral artery is occluded with reconstitution of the popliteal artery, which remains disease.  There is three-vessel runoff.   Left Lower Extremity:  Left common femoral and profunda femoral artery are patent throughout their course.  The superficial femoral artery is diffusely diseased with areas of high-grade stenosis in the mid portion.  Popliteal artery is patent and there is two-vessel runoff via the anterior tibial and peroneal  Intervention:  I spent approximately 45 minutes to an hour trying to gain access over the bifurcation into the right external iliac artery.  I used a large seroma of catheters and sheaths including 5, 6, and 7 [place] high flex Ansell sheath, Rosen wire, a 4 French straight catheter and quick cross catheter.  Ultimately I was unable to get a sheath in the right external iliac artery to obtain a platform for intervention.  Therefore because the patient was heparinized, I elected not to proceed with antegrade access in this setting.  Impression:  #1  right superficial femoral artery occlusion.  #2  unable to cross the aortic bifurcation for a stable platform to intervene.  The patient will need to return for antegrade access and an attempted percutaneous revascularization of his occluded right superficial femoral artery.     Theotis Burrow, M.D. [facility] Office: [phone] Pager:  [phone] right toe ulcer

## 2017-07-26 NOTE — Progress Notes (Signed)
[facility] PROGRESS NOTE  JDEN WANT ZOX:[id number] DOB: 06-May-1941 DOA: 07/16/2017 PCP: Thressa Sheller, MD  Interim summary and history of present illness 76 y.o. male with medical history significant of hypertension, hyperlipidemia, COPD, GERD, depression, formal smoker, sCHF with EF of 30-35%, CAD (multivessel disease, but not a candidate for CABG), chronic thrombocytopenia, who presents with foot pain, chest pain, and dysuria. Patient found to have sepsis most likely due to UTI and osteomyelitis on his right second toe.  Assessment/Plan: 1-sepsis secondary to osteomyelitis and UTI -Patient responded well to IV fluid resuscitation and the use of broad-spectrum antibiotics -overall hemodynamically stable at this time -Will follow results from blood cultures and urine culture -Continue vanc and zosyn -Assessment of vasculature integrity on his right leg with ABI severe reduction in arterial flow on right and moderate reduction on left.    -orthopedic service on board and will follow their recs -Vascular surgery consult requested: Dr. Donnetta Hutching saw him and because he has severe coronary disease and is not a candidate for revascularization. Explained that his only option would be endovascular. They are planning arteriography and possible intervention today.  Encouraged ambulation and PT evaluation.  He is refusing SNF at this time but agreed to [facility].   2-intermittent chest discomfort in patient with history of coronary artery disease: Patient had a history of known coronary artery disease (multiple vessels) found not a candidate for CABG. -He has been experiencing stable angina for years -Continue conservative management with the use of aspirin, statins, beta blocker and imdur.  -Cardiology has seen patient and recommended no further ischemic workup at this moment. -neg troponin X3; no CP  3-history of COPD -No wheezing and with good air movement bilaterally  -Will continue as needed  albuterol nebulizer -cessation counseling ongoing  4-chronic idiopathic thrombocytopenia -No signs of acute bleeding -Will continue monitoring platelets, stable to improved.  5-tobacco abuse -Cessation counseling provided -Nicotine patch has been ordered  6-hypertension -BP soft and low especially when changing positions -following cardiology recs, on coreg, lisinopril, imdur  7-hyperlipidemia -will continue lipitor 80 mg daily  8-stage 2 sacral pressure injury -continue preventive measures -overlay mattress ordered   9-mild hyponatremia: from dehydration  -will continue gentle IVFs -patient able to eat and drink well now -will monitor electrolytes   Code Status: Full code Family Communication: None at bedside Disposition Plan: Refuses SNF, agrees to [facility]  Consultants:  Neurology service  Orthopedic service  Vascular surgery service  Procedures:  See below for x-ray reports   2-D echo - Procedure narrative: Transthoracic echocardiography. Image   quality was suboptimal. The study was technically difficult, as a  result of poor acoustic windows, poor sound wave transmission,  and body habitus. - Left ventricle: The cavity size was normal. Systolic function was normal. The estimated ejection fraction was 55%. Doppler parameters are consistent with abnormal left ventricular relaxation (grade 1 diastolic dysfunction). - Regional wall motion abnormality: Hypokinesis of the basal inferior myocardium. - Aortic valve: Mildly thickened, mildly calcified leaflets. There was mild regurgitation. - Mitral valve: Mildly thickened leaflets . There was mild to moderate regurgitation. - Right ventricle: Systolic function was mildly reduced. - Tricuspid valve: There was mild regurgitation.   Right lower extremity ABI  Antibiotics:  Vancomycin and Zosyn 9/8  HPI/Subjective: Pt without complaints. Awaiting procedure today.       Objective: Vitals:   07/25/17 1957 07/26/17  0536  BP: (!) 110/54 (!) 146/75  Pulse: 68 80  Resp: 18   Temp: 98.5  F (36.9 C) 98.2 F (36.8 C)  SpO2: 97% 97%    Intake/Output Summary (Last 24 hours) at 07/26/17 0843 Last data filed at 07/26/17 7425  Gross per 24 hour  Intake             1600 ml  Output             3000 ml  Net            -1400 ml   Filed Weights   07/24/17 0542 07/25/17 0426 07/26/17 0536  Weight: 71.3 kg (157 lb 1.6 oz) 71.1 kg (156 lb 12.8 oz) 69.7 kg (153 lb 11.2 oz)    Exam:   General:  Afebrile, no CP, no nausea, no vomiting.  NAD.   Cardiovascular: S1 and S2, no rubs, no gallops, no JVD.  Respiratory: normal resp effort, no wheezing, no crackles   Abdomen: soft, NT, ND, positive BS  Musculoskeletal: trace edema bilaterally; mild erythematous changes in dorsal aspect of his foot, right second toe with necrotic tip. Patient decrease pedal and popliteal on RLE. Wound clean and dry.  Skin: stage 2 sacral pressure injury, no superimposed infection  Data Reviewed: Basic Metabolic Panel:  Recent Labs Lab 07/21/17 0411 07/22/17 0429 07/23/17 0553 07/25/17 0404 07/26/17 0540  NA 133* 132* 132* 133* 134*  K 4.1 4.3 4.0 4.0 3.8  CL 103 104 104 103 105  CO2 25 23 22 23  21*  GLUCOSE 94 93 85 89 94  BUN 11 13 12 16 14   CREATININE 1.06 1.10 0.91 1.02 0.97  CALCIUM 8.3* 8.7* 8.7* 8.7* 9.0   CBC:  Recent Labs Lab 07/21/17 0411 07/22/17 0429 07/23/17 0553 07/25/17 0404 07/26/17 0540  WBC 12.4* 11.7* 11.7* 10.5 10.9*  HGB 9.2* 9.4* 9.8* 9.1* 9.6*  HCT 28.3* 28.8* 29.7* 28.0* 29.4*  MCV 77.3* 76.8* 76.9* 76.7* 76.6*  PLT 121* 146* 143* 156 149*   Cardiac Enzymes: No results for input(s): CKTOTAL, CKMB, CKMBINDEX, TROPONINI in the last 168 hours. BNP (last 3 results)  Recent Labs  07/17/17 0317  BNP 145.4*   CBG:  Recent Labs Lab 07/20/17 0628 07/21/17 0547 07/22/17 0536 07/24/17 0652 07/25/17 0606  GLUCAP 98 80 93 116* 92   Studies: No results found.  Scheduled  Meds: . aspirin  81 mg Oral Daily  . atorvastatin  80 mg Oral q1800  . carvedilol  6.25 mg Oral BID WC  . feeding supplement (ENSURE ENLIVE)  237 mL Oral BID BM  . isosorbide mononitrate  30 mg Oral Q24H  . lisinopril  2.5 mg Oral Daily  . multivitamin with minerals  1 tablet Oral Daily  . nicotine  14 mg Transdermal Daily  . polyethylene glycol  17 g Oral BID  . senna-docusate  1 tablet Oral QHS   Continuous Infusions: . piperacillin-tazobactam (ZOSYN)  IV 3.375 g (07/26/17 0625)  . vancomycin Stopped (07/25/17 2254)    Principal Problem:   Osteomyelitis of toe of right foot ([place]) Active Problems:   CAD (coronary artery disease), native coronary artery   Chronic systolic CHF (congestive heart failure) (HCC)   COPD (chronic obstructive pulmonary disease) (HCC)   Chronic idiopathic thrombocytopenia (HCC)   UTI (urinary tract infection)   Sepsis (HCC)   Hypotension   Chest pain   Pressure injury of skin  Time spent: 18 minutes  [employer] [phone]. If 7PM-7AM, please contact night-coverage at [email or web address], password [facility] 07/26/2017, 8:43 AM  LOS: 9 days

## 2017-07-26 NOTE — Interval H&P Note (Signed)
History and Physical Interval Note:  07/26/2017 1:45 PM  Vincent Dunlap  has presented today for surgery, with the diagnosis of pvd  The various methods of treatment have been discussed with the patient and family. After consideration of risks, benefits and other options for treatment, the patient has consented to  Procedure(s): ABDOMINAL AORTOGRAM W/LOWER EXTREMITY (N/A) as a surgical intervention .  The patient's history has been reviewed, patient examined, no change in status, stable for surgery.  I have reviewed the patient's chart and labs.  Questions were answered to the patient's satisfaction.     [name]

## 2017-07-27 ENCOUNTER — Encounter (HOSPITAL_COMMUNITY): Payer: Self-pay | Admitting: Surgery

## 2017-07-27 MED ORDER — CEFAZOLIN SODIUM-DEXTROSE 1-4 GM/50ML-% IV SOLN
1.0000 g | INTRAVENOUS | Status: AC
  Administered 2017-07-28: 1 g via INTRAVENOUS
  Filled 2017-07-27: qty 50

## 2017-07-27 NOTE — Progress Notes (Signed)
[facility] PROGRESS NOTE  Vincent Dunlap DTO:[id number] DOB: July 02, 1941 DOA: 07/16/2017 PCP: Thressa Sheller, MD  Interim summary and history of present illness 76 y.o. male with medical history significant of hypertension, hyperlipidemia, COPD, GERD, depression, formal smoker, sCHF with EF of 30-35%, CAD (multivessel disease, but not a candidate for CABG), chronic thrombocytopenia, who presents with foot pain, chest pain, and dysuria. Patient found to have sepsis most likely due to UTI and osteomyelitis on his right second toe.  Assessment/Plan: 1-sepsis secondary to osteomyelitis and UTI -Patient responded well to IV fluid resuscitation and the use of broad-spectrum antibiotics -overall hemodynamically stable at this time -Will follow results from blood cultures and urine culture -Continue vanc and zosyn -Assessment of vasculature integrity on his right leg with ABI demonstarted severe reduction in arterial flow on right and moderate reduction on left.    -orthopedic service on board and will follow vascular final intervention before offering any amputation. Will follow rec's. -Vascular surgery consult requested: Dr. Donnetta Hutching saw him and because he has severe coronary disease and base on ABI results found to be not a candidate for revascularization. Explained that his only option would be endovascular. Initial approach failed and patient will require return for anterior approach.  2-intermittent chest discomfort in patient with history of coronary artery disease: Patient had a history of known coronary artery disease (multiple vessels) found not a candidate for CABG. -He has been experiencing stable angina for years; which are essentially unchanged. -Continue conservative management with the use of aspirin, statins, beta blocker and imdur.  -Cardiology has seen patient and recommended no further ischemic workup at this moment. -neg troponin X3; no CP  3-history of COPD -No wheezing and  with good air movement bilaterally  -Will continue as needed albuterol nebulizer -cessation counseling ongoing  4-chronic idiopathic thrombocytopenia -No signs of acute bleeding -continue monitoring platelets count; improved overall  5-tobacco abuse -Cessation counseling encouraged -Nicotine patch has been ordered  6-hypertension -BP is stable and patient reported no further orthostatic symptoms -following cardiology recs will continue coreg, lisinopril, imdur  7-hyperlipidemia -will continue lipitor 80 mg daily -liver function is stable   8-stage 2 sacral pressure injury -continue preventive measures -overlay mattress ordered   9-mild hyponatremia:  -sodium 134 -eating and drinking ok -will monitor electrolytes   Code Status: Full code Family Communication: None at bedside Disposition Plan: patient to go home with [facility] services at discharge   Consultants:  Neurology service  Orthopedic service  Vascular surgery service  Procedures:  See below for x-ray reports   2-D echo - Procedure narrative: Transthoracic echocardiography. Image   quality was suboptimal. The study was technically difficult, as a  result of poor acoustic windows, poor sound wave transmission,  and body habitus. - Left ventricle: The cavity size was normal. Systolic function was normal. The estimated ejection fraction was 55%. Doppler parameters are consistent with abnormal left ventricular relaxation (grade 1 diastolic dysfunction). - Regional wall motion abnormality: Hypokinesis of the basal inferior myocardium. - Aortic valve: Mildly thickened, mildly calcified leaflets. There was mild regurgitation. - Mitral valve: Mildly thickened leaflets . There was mild to moderate regurgitation. - Right ventricle: Systolic function was mildly reduced. - Tricuspid valve: There was mild regurgitation.   Right lower extremity ABI  Antibiotics:  Vancomycin and Zosyn 9/8  HPI/Subjective: Afebrile, no  CP, no SOB. Will like to go home at discharge and expressed the need of something done to his foot as he continue having throbbing pain.  Objective: Vitals:   07/27/17 0613 07/27/17 1211  BP: (!) 113/91 (!) 121/49  Pulse: 80 69  Resp: 18 20  Temp: 98.2 F (36.8 C) 98.6 F (37 C)  SpO2: 99% 97%    Intake/Output Summary (Last 24 hours) at 07/27/17 1921 Last data filed at 07/27/17 1722  Gross per 24 hour  Intake             1040 ml  Output              925 ml  Net              115 ml   Filed Weights   07/25/17 0426 07/26/17 0536 07/27/17 0613  Weight: 71.1 kg (156 lb 12.8 oz) 69.7 kg (153 lb 11.2 oz) 72.6 kg (160 lb 1.6 oz)    Exam:   General:  Afebrile, no CP, no nausea, no vomiting and no SOB> continue reporting intermittent pain on his right foot.    Cardiovascular: S1 and S2, no rubs, no gallops; no JVD.  Respiratory: normal resp effort, no wheezing, no crackles    Abdomen: soft, NT, ND, positive BD  Musculoskeletal: trace edema bilaterally; no erythema appreciated, patient;s right second toe with necrotic tip, no discharges; wound is clean and dry.  Skin: stage 2 sacral pressure injury, no visible superimposed infection.  Data Reviewed: Basic Metabolic Panel:  Recent Labs Lab 07/21/17 0411 07/22/17 0429 07/23/17 0553 07/25/17 0404 07/26/17 0540  NA 133* 132* 132* 133* 134*  K 4.1 4.3 4.0 4.0 3.8  CL 103 104 104 103 105  CO2 25 23 22 23  21*  GLUCOSE 94 93 85 89 94  BUN 11 13 12 16 14   CREATININE 1.06 1.10 0.91 1.02 0.97  CALCIUM 8.3* 8.7* 8.7* 8.7* 9.0   CBC:  Recent Labs Lab 07/21/17 0411 07/22/17 0429 07/23/17 0553 07/25/17 0404 07/26/17 0540  WBC 12.4* 11.7* 11.7* 10.5 10.9*  HGB 9.2* 9.4* 9.8* 9.1* 9.6*  HCT 28.3* 28.8* 29.7* 28.0* 29.4*  MCV 77.3* 76.8* 76.9* 76.7* 76.6*  PLT 121* 146* 143* 156 149*   BNP (last 3 results)  Recent Labs  07/17/17 0317  BNP 145.4*   CBG:  Recent Labs Lab 07/21/17 0547 07/22/17 0536  07/24/17 0652 07/25/17 0606  GLUCAP 80 93 116* 92   Studies: No results found.  Scheduled Meds: . aspirin  81 mg Oral Daily  . atorvastatin  80 mg Oral q1800  . carvedilol  6.25 mg Oral BID WC  . feeding supplement (ENSURE ENLIVE)  237 mL Oral BID BM  . isosorbide mononitrate  30 mg Oral Q24H  . lisinopril  2.5 mg Oral Daily  . multivitamin with minerals  1 tablet Oral Daily  . nicotine  14 mg Transdermal Daily  . polyethylene glycol  17 g Oral BID  . senna-docusate  1 tablet Oral QHS  . sodium chloride flush  3 mL Intravenous Q12H   Continuous Infusions: . sodium chloride    . [START ON 07/28/2017]  ceFAZolin (ANCEF) IV    . piperacillin-tazobactam (ZOSYN)  IV Stopped (07/27/17 1736)  . vancomycin Stopped (07/27/17 1220)    Time spent: 25 minutes  Barton Dubois  [facility] Pager [phone]. If 7PM-7AM, please contact night-coverage at [email or web address], password [facility] 07/27/2017, 7:21 PM  LOS: 10 days

## 2017-07-27 NOTE — Progress Notes (Signed)
Pharmacy Antibiotic Note  Vincent Dunlap is a 76 y.o. male who continues on day #11 vancomycin and zosyn for right second toe osteomyelitis. Renal function stable. Previous vancomycin trough therapeutic on 9/13, will check another tomorrow.   Vancomycin trough goal 15-20  Plan: 1) Continue vancomycin 750mg  IV q12, trough 9/20 @ 0930 2) Continue zosyn 3.375g IV q8 (EI) 3) Follow up LOT  Height: 5\' 11"  (180.3 cm) Weight: 160 lb 1.6 oz (72.6 kg) (scale a) IBW/kg (Calculated) : 75.3  Temp (24hrs), Avg:98.1 F (36.7 C), Min:98 F (36.7 C), Max:98.2 F (36.8 C)   Recent Labs Lab 07/21/17 0411 07/21/17 0943 07/22/17 0429 07/23/17 0553 07/25/17 0404 07/26/17 0540  WBC 12.4*  --  11.7* 11.7* 10.5 10.9*  CREATININE 1.06  --  1.10 0.91 1.02 0.97  VANCOTROUGH  --  17  --   --   --   --     Estimated Creatinine Clearance: 67.6 mL/min (by C-G formula based on SCr of 0.97 mg/dL).    Allergies  Allergen Reactions  . Chantix [Varenicline] Other (See Comments)    Pt states it made him crazy    Antimicrobials this admission: 9/9 Vancomycin >> 9/9 Zosyn >>  Dose adjustments this admission: 9/13 VT = 17 on 750mg  IV q12, continue  Microbiology results: 9/8 urine>> multiple species 9/9 blood x2>> ngtd  Thank you for allowing pharmacy to be a part of this patient's care.  Deboraha Sprang 07/27/2017 10:09 AM

## 2017-07-27 NOTE — Progress Notes (Addendum)
Physical Therapy Treatment Patient Details Name: Vincent Dunlap MRN: [id number] DOB: Nov 12, 1940 Today's Date: 07/27/2017    History of Present Illness Pt is a 76 y.o. male with history significant for hypertension, hyperlipidemia, COPD, GERD, depression, CHF with EF 30-35%, CAD (multivessel disease but not candidate for CABG, chronic thrombocytopenia. He presented with foot pain, chest pain, and dysuria. Found to have osteomyelitis R second toe.     PT Comments    Patient tolerated ambulation of 68ft with min A and RW. CAM boot donned R LE. Pt is refusing SNF and will need 24 hour assistance/supervision at home and will benefit from further skilled PT services to maximize independence and safety with mobility.   Follow Up Recommendations  Home health PT;Supervision/Assistance - 24 hour     Equipment Recommendations  Rolling walker with 5" wheels    Recommendations for Other Services       Precautions / Restrictions Precautions Precautions: Fall    Mobility  Bed Mobility Overal bed mobility: Modified Independent Bed Mobility: Supine to Sit;Sit to Supine     Supine to sit: HOB elevated;Modified independent (Device/Increase time) Sit to supine: Modified independent (Device/Increase time)   General bed mobility comments: increased time and effort; use of rail  Transfers Overall transfer level: Needs assistance Equipment used: Rolling walker (2 wheeled) Transfers: Sit to/from Stand Sit to Stand: Min assist         General transfer comment: assist to power up into standing with pt using momemtum and cues for hand placement  Ambulation/Gait Ambulation/Gait assistance: Min assist Ambulation Distance (Feet): 24 Feet Assistive device: Rolling walker (2 wheeled) Gait Pattern/deviations: Step-through pattern;Decreased stance time - left;Decreased step length - right;Decreased step length - left Gait velocity: decreased   General Gait Details: assist to steady and to manage RW  especially when turning; cues for safe use of AD and posture   Stairs            Wheelchair Mobility    Modified Rankin (Stroke Patients Only)       Balance Overall balance assessment: Needs assistance   Sitting balance-Leahy Scale: Good       Standing balance-Leahy Scale: Poor Standing balance comment: Relies on B UE support                            Cognition Arousal/Alertness: Awake/alert Behavior During Therapy: WFL for tasks assessed/performed Overall Cognitive Status: Within Functional Limits for tasks assessed                                        Exercises      General Comments        Pertinent Vitals/Pain Pain Assessment: Faces Faces Pain Scale: Hurts a little bit Pain Location: L foot with weight bearing Pain Descriptors / Indicators: Discomfort;Guarding Pain Intervention(s): Limited activity within patient's tolerance;Monitored during session;Premedicated before session;Repositioned    Home Living                      Prior Function            PT Goals (current goals can now be found in the care plan section) Acute Rehab PT Goals Patient Stated Goal: to go home  Progress towards PT goals: Progressing toward goals    Frequency    Min 3X/week  PT Plan Discharge plan needs to be updated    Co-evaluation              AM-PAC PT "6 Clicks" Daily Activity  Outcome Measure  Difficulty turning over in bed (including adjusting bedclothes, sheets and blankets)?: A Little Difficulty moving from lying on back to sitting on the side of the bed? : A Lot Difficulty sitting down on and standing up from a chair with arms (e.g., wheelchair, bedside commode, etc,.)?: Unable Help needed moving to and from a bed to chair (including a wheelchair)?: A Little Help needed walking in hospital room?: A Little Help needed climbing 3-5 steps with a railing? : A Lot 6 Click Score: 14    End of Session  Equipment Utilized During Treatment: Gait belt;Other (comment) (CAM boot ) Activity Tolerance: Patient tolerated treatment well Patient left: in bed;with call bell/phone within reach Nurse Communication: Mobility status PT Visit Diagnosis: Unsteadiness on feet (R26.81);Other abnormalities of gait and mobility (R26.89);Muscle weakness (generalized) (M62.81);Difficulty in walking, not elsewhere classified (R26.2);Pain Pain - Right/Left: Right Pain - part of body: Ankle and joints of foot     Time: 6599-3570 PT Time Calculation (min) (ACUTE ONLY): 36 min  Charges:  $Gait Training: 8-22 mins $Therapeutic Activity: 8-22 mins                    G Codes:       Earney Navy, PTA Pager: [phone]     Darliss Cheney 07/27/2017, 3:27 PM

## 2017-07-27 NOTE — Consult Note (Signed)
   [facility] CM Inpatient Consult   07/27/2017  Vincent Dunlap 1941/10/09 [id number]   Patient evaluated for community based chronic disease management services with [place] [facility] as a benefit of patient's Medicare Insurance reported in progression meeting he is refusing a skilled facility stay. Met with patient at bedside to explain [facility] services. Patient states he had Personal Care Services 21/2 hour 7 days a week. Patient states he has been approved for [employer] for transportation. He states he is unable to read but can write his name. He endorses Dr. Thressa Sheller as his primary care provider, [facility] but can't remember the last time he has been able to go for an office visit. PT/OT notes reviewed and are recommending a skilled nursing facility stay.  Patient is declining a skilled facility at this time states, "I've been out to [facility] before but I . Patient may be a candidate for the [facility]. [place] to assess. Continue to define patient's insurance coverage whether [employer] or [employer].  Patient signed consent form. Will continue to follow.  Left contact information and [facility] literature at bedside. Will follow for progress and needs. Made Inpatient Case Manager aware that [facility] Management following. Of note, [facility] Care Management services does not replace or interfere with any services that are arranged by inpatient case management or social work. This Probation officer will follow up with Dr. Doristine Devoid office of [place] Management involvement in transition of care needs.  For additional questions or referrals please contact:    Natividad Brood, RN BSN [facility] Liaison  [phone] business mobile phone Toll free office [phone]

## 2017-07-28 ENCOUNTER — Encounter (HOSPITAL_COMMUNITY)
Admission: EM | Disposition: A | Discharge status: Home Health | Payer: Self-pay | Source: Home / Self Care | Attending: Family Medicine

## 2017-07-28 ENCOUNTER — Inpatient Hospital Stay (HOSPITAL_COMMUNITY): Payer: Medicare Other | Admitting: Anesthesiology

## 2017-07-28 DIAGNOSIS — I70261 Atherosclerosis of native arteries of extremities with gangrene, right leg: Secondary | ICD-10-CM

## 2017-07-28 HISTORY — PX: AMPUTATION: SHX166

## 2017-07-28 LAB — BASIC METABOLIC PANEL
ANION GAP: 9 (ref 5–15)
BUN: 14 mg/dL (ref 6–20)
CALCIUM: 8.6 mg/dL — AB (ref 8.9–10.3)
CO2: 20 mmol/L — ABNORMAL LOW (ref 22–32)
Chloride: 105 mmol/L (ref 101–111)
Creatinine, Ser: 0.86 mg/dL (ref 0.61–1.24)
Glucose, Bld: 97 mg/dL (ref 65–99)
Potassium: 3.6 mmol/L (ref 3.5–5.1)
Sodium: 134 mmol/L — ABNORMAL LOW (ref 135–145)

## 2017-07-28 LAB — PROTIME-INR
INR: 1.19
Prothrombin Time: 15 seconds (ref 11.4–15.2)

## 2017-07-28 LAB — VANCOMYCIN, TROUGH: VANCOMYCIN TR: 22 ug/mL — AB (ref 15–20)

## 2017-07-28 LAB — CBC
HEMATOCRIT: 27.9 % — AB (ref 39.0–52.0)
HEMOGLOBIN: 9.2 g/dL — AB (ref 13.0–17.0)
MCH: 25.4 pg — ABNORMAL LOW (ref 26.0–34.0)
MCHC: 33 g/dL (ref 30.0–36.0)
MCV: 77.1 fL — ABNORMAL LOW (ref 78.0–100.0)
PLATELETS: 124 10*3/uL — AB (ref 150–400)
RBC: 3.62 MIL/uL — AB (ref 4.22–5.81)
RDW: 18.8 % — AB (ref 11.5–15.5)
WBC: 12.5 10*3/uL — ABNORMAL HIGH (ref 4.0–10.5)

## 2017-07-28 LAB — GLUCOSE, CAPILLARY: GLUCOSE-CAPILLARY: 94 mg/dL (ref 65–99)

## 2017-07-28 SURGERY — AMPUTATION DIGIT
Anesthesia: General | Site: Leg Lower | Laterality: Right

## 2017-07-28 MED ORDER — PHENYLEPHRINE 40 MCG/ML (10ML) SYRINGE FOR IV PUSH (FOR BLOOD PRESSURE SUPPORT)
PREFILLED_SYRINGE | INTRAVENOUS | Status: AC
  Filled 2017-07-28: qty 20

## 2017-07-28 MED ORDER — LACTATED RINGERS IV SOLN
INTRAVENOUS | Status: DC
Start: 2017-07-28 — End: 2017-07-30

## 2017-07-28 MED ORDER — ONDANSETRON HCL 4 MG/2ML IJ SOLN: 4.0000 mg | Freq: Four times a day (QID) | INTRAMUSCULAR | Status: DC | PRN

## 2017-07-28 MED ORDER — SODIUM CHLORIDE 0.9 % IV SOLN
INTRAVENOUS | Status: DC | PRN
  Administered 2017-07-28: 500 mL

## 2017-07-28 MED ORDER — IODIXANOL 320 MG/ML IV SOLN
INTRAVENOUS | Status: DC | PRN
  Administered 2017-07-28: 100 mL via INTRA_ARTERIAL

## 2017-07-28 MED ORDER — CLOPIDOGREL BISULFATE 75 MG PO TABS
75.0000 mg | ORAL_TABLET | Freq: Every day | ORAL | Status: DC
  Administered 2017-07-29 – 2017-07-30 (×2): 75 mg via ORAL
  Filled 2017-07-28 (×2): qty 1

## 2017-07-28 MED ORDER — SODIUM CHLORIDE 0.9% FLUSH
3.0000 mL | Freq: Two times a day (BID) | INTRAVENOUS | Status: DC
  Administered 2017-07-29 – 2017-07-30 (×2): 3 mL via INTRAVENOUS

## 2017-07-28 MED ORDER — IODIXANOL 320 MG/ML IV SOLN
INTRAVENOUS | Status: DC | PRN
  Administered 2017-07-28: 25 mL via INTRA_ARTERIAL

## 2017-07-28 MED ORDER — HYDRALAZINE HCL 20 MG/ML IJ SOLN: 5.0000 mg | INTRAMUSCULAR | Status: DC | PRN

## 2017-07-28 MED ORDER — PROTAMINE SULFATE 10 MG/ML IV SOLN
INTRAVENOUS | Status: DC | PRN
  Administered 2017-07-28: 10 mg via INTRAVENOUS
  Administered 2017-07-28: 5 mg via INTRAVENOUS
  Administered 2017-07-28: 10 mg via INTRAVENOUS

## 2017-07-28 MED ORDER — SODIUM CHLORIDE 0.9 % WEIGHT BASED INFUSION
1.0000 mL/kg/h | INTRAVENOUS | Status: AC
  Administered 2017-07-28: 1 mL/kg/h via INTRAVENOUS

## 2017-07-28 MED ORDER — ONDANSETRON HCL 4 MG/2ML IJ SOLN: 4.0000 mg | Freq: Once | INTRAMUSCULAR | Status: DC | PRN

## 2017-07-28 MED ORDER — PHENYLEPHRINE HCL 10 MG/ML IJ SOLN
INTRAMUSCULAR | Status: DC | PRN
  Administered 2017-07-28 (×2): 80 ug via INTRAVENOUS

## 2017-07-28 MED ORDER — FENTANYL CITRATE (PF) 100 MCG/2ML IJ SOLN
INTRAMUSCULAR | Status: DC | PRN
Start: 2017-07-28 — End: 2017-07-28
  Administered 2017-07-28 (×2): 25 ug via INTRAVENOUS

## 2017-07-28 MED ORDER — ACETAMINOPHEN 325 MG PO TABS: 650.0000 mg | ORAL_TABLET | ORAL | Status: DC | PRN

## 2017-07-28 MED ORDER — SODIUM CHLORIDE 0.9 % IV SOLN: 250.0000 mL | INTRAVENOUS | Status: DC | PRN

## 2017-07-28 MED ORDER — LIDOCAINE HCL (CARDIAC) 20 MG/ML IV SOLN
INTRAVENOUS | Status: DC | PRN
  Administered 2017-07-28: 50 mg via INTRAVENOUS

## 2017-07-28 MED ORDER — LACTATED RINGERS IV SOLN
INTRAVENOUS | Status: DC | PRN
  Administered 2017-07-28 (×2): via INTRAVENOUS

## 2017-07-28 MED ORDER — LIDOCAINE 2% (20 MG/ML) 5 ML SYRINGE
INTRAMUSCULAR | Status: AC
  Filled 2017-07-28: qty 5

## 2017-07-28 MED ORDER — VANCOMYCIN HCL 500 MG IV SOLR
500.0000 mg | Freq: Two times a day (BID) | INTRAVENOUS | Status: DC
  Administered 2017-07-28: 500 mg via INTRAVENOUS
  Filled 2017-07-28 (×2): qty 500

## 2017-07-28 MED ORDER — PROPOFOL 10 MG/ML IV BOLUS
INTRAVENOUS | Status: DC | PRN
Start: 2017-07-28 — End: 2017-07-28
  Administered 2017-07-28: 150 mg via INTRAVENOUS

## 2017-07-28 MED ORDER — 0.9 % SODIUM CHLORIDE (POUR BTL) OPTIME
TOPICAL | Status: DC | PRN
  Administered 2017-07-28: 1000 mL

## 2017-07-28 MED ORDER — DEXTROSE 5 % IV SOLN
INTRAVENOUS | Status: DC | PRN
  Administered 2017-07-28: 25 ug/min via INTRAVENOUS

## 2017-07-28 MED ORDER — FENTANYL CITRATE (PF) 250 MCG/5ML IJ SOLN
INTRAMUSCULAR | Status: AC
  Filled 2017-07-28: qty 5

## 2017-07-28 MED ORDER — HEPARIN SODIUM (PORCINE) 1000 UNIT/ML IJ SOLN
INTRAMUSCULAR | Status: DC | PRN
Start: 2017-07-28 — End: 2017-07-28
  Administered 2017-07-28: 7000 [IU] via INTRAVENOUS
  Administered 2017-07-28: 4000 [IU] via INTRAVENOUS

## 2017-07-28 MED ORDER — FENTANYL CITRATE (PF) 100 MCG/2ML IJ SOLN: 25.0000 ug | INTRAMUSCULAR | Status: DC | PRN

## 2017-07-28 MED ORDER — CLOPIDOGREL BISULFATE 75 MG PO TABS
300.0000 mg | ORAL_TABLET | Freq: Once | ORAL | Status: DC
  Filled 2017-07-28: qty 4

## 2017-07-28 MED ORDER — LABETALOL HCL 5 MG/ML IV SOLN: 10.0000 mg | INTRAVENOUS | Status: DC | PRN

## 2017-07-28 MED ORDER — SODIUM CHLORIDE 0.9% FLUSH: 3.0000 mL | INTRAVENOUS | Status: DC | PRN

## 2017-07-28 MED ORDER — PROPOFOL 10 MG/ML IV BOLUS
INTRAVENOUS | Status: AC
  Filled 2017-07-28: qty 40

## 2017-07-28 SURGICAL SUPPLY — 64 items
BAG SNAP BAND KOVER 36X36 (MISCELLANEOUS) ×10 IMPLANT
BANDAGE ACE 4X5 VEL STRL LF (GAUZE/BANDAGES/DRESSINGS) ×5 IMPLANT
BLADE 11 SAFETY STRL DISP (BLADE) ×5 IMPLANT
BLADE AVERAGE 25MMX9MM (BLADE)
BLADE AVERAGE 25X9 (BLADE) IMPLANT
BLADE SAW SGTL 81X20 HD (BLADE) IMPLANT
BNDG GAUZE ELAST 4 BULKY (GAUZE/BANDAGES/DRESSINGS) ×5 IMPLANT
CANISTER SUCT 3000ML PPV (MISCELLANEOUS) ×5 IMPLANT
CATH CXI SUPP ANG 2.6FR 150CM (MICROCATHETER) ×10 IMPLANT
CATH OMNI FLUSH .035X70CM (CATHETERS) ×5 IMPLANT
CATH QUICKCROSS SUPP .035X90CM (MICROCATHETER) ×5 IMPLANT
COVER PROBE W GEL 5X96 (DRAPES) ×10 IMPLANT
COVER SURGICAL LIGHT HANDLE (MISCELLANEOUS) ×5 IMPLANT
DERMABOND ADVANCED (GAUZE/BANDAGES/DRESSINGS) ×2
DERMABOND ADVANCED .7 DNX12 (GAUZE/BANDAGES/DRESSINGS) ×3 IMPLANT
DEVICE TORQUE KENDALL .025-038 (MISCELLANEOUS) ×5 IMPLANT
DRAPE BACK TABLE (DRAPES) ×5 IMPLANT
DRAPE EXTREMITY T 121X128X90 (DRAPE) ×5 IMPLANT
DRAPE FEMORAL ANGIO 80X135IN (DRAPES) ×5 IMPLANT
DRAPE HALF SHEET 40X57 (DRAPES) ×5 IMPLANT
DRSG EMULSION OIL 3X3 NADH (GAUZE/BANDAGES/DRESSINGS) ×5 IMPLANT
ELECT REM PT RETURN 9FT ADLT (ELECTROSURGICAL) ×5
ELECTRODE REM PT RTRN 9FT ADLT (ELECTROSURGICAL) ×3 IMPLANT
GAUZE SPONGE 4X4 12PLY STRL (GAUZE/BANDAGES/DRESSINGS) ×5 IMPLANT
GAUZE SPONGE 4X4 12PLY STRL LF (GAUZE/BANDAGES/DRESSINGS) ×5 IMPLANT
GAUZE SPONGE 4X4 16PLY XRAY LF (GAUZE/BANDAGES/DRESSINGS) ×5 IMPLANT
GLOVE BIO SURGEON STRL SZ7.5 (GLOVE) ×5 IMPLANT
GLOVE BIO SURGEON STRL SZ8 (GLOVE) ×5 IMPLANT
GLOVE BIOGEL PI IND STRL 6.5 (GLOVE) ×6 IMPLANT
GLOVE BIOGEL PI INDICATOR 6.5 (GLOVE) ×4
GLOVE SKINSENSE NS SZ6.5 (GLOVE) ×2
GLOVE SKINSENSE STRL SZ6.5 (GLOVE) ×3 IMPLANT
GOWN STRL NON-REIN LRG LVL3 (GOWN DISPOSABLE) ×10 IMPLANT
GOWN STRL REUS W/ TWL LRG LVL3 (GOWN DISPOSABLE) ×6 IMPLANT
GOWN STRL REUS W/ TWL XL LVL3 (GOWN DISPOSABLE) ×3 IMPLANT
GOWN STRL REUS W/TWL LRG LVL3 (GOWN DISPOSABLE) ×4
GOWN STRL REUS W/TWL XL LVL3 (GOWN DISPOSABLE) ×2
GUIDEWIRE ANGLED .035X150CM (WIRE) ×5 IMPLANT
KIT BASIN OR (CUSTOM PROCEDURE TRAY) ×5 IMPLANT
KIT ENCORE 26 ADVANTAGE (KITS) ×5 IMPLANT
KIT ROOM TURNOVER OR (KITS) ×5 IMPLANT
NEEDLE HYPO 25GX1X1/2 BEV (NEEDLE) IMPLANT
NS IRRIG 1000ML POUR BTL (IV SOLUTION) ×5 IMPLANT
PACK GENERAL/GYN (CUSTOM PROCEDURE TRAY) ×5 IMPLANT
PAD ARMBOARD 7.5X6 YLW CONV (MISCELLANEOUS) ×10 IMPLANT
SET MICROPUNCTURE 5F STIFF (MISCELLANEOUS) ×25 IMPLANT
SHEATH BRITE TIP 7FRX11 (SHEATH) ×5 IMPLANT
SHEATH PINNACLE 6F 10CM (SHEATH) ×5 IMPLANT
SNARE GOOSENECK 10MM (VASCULAR PRODUCTS) ×5 IMPLANT
STENT VIABAHN 5X150X120 (Permanent Stent) ×10 IMPLANT
STENT VIABAHN 6X150X120 (Permanent Stent) ×5 IMPLANT
STENT VIABAHN 6X50X120 (Permanent Stent) ×5 IMPLANT
STERLING PTA BALLOON ×5 IMPLANT
STOPCOCK MORSE 400PSI 3WAY (MISCELLANEOUS) ×5 IMPLANT
SUT ETHILON 3 0 PS 1 (SUTURE) ×5 IMPLANT
SYR CONTROL 10ML LL (SYRINGE) IMPLANT
SYR MEDRAD MARK V 150ML (SYRINGE) ×5 IMPLANT
SYRINGE 12CC LL (MISCELLANEOUS) ×10 IMPLANT
SYRINGE 20CC LL (MISCELLANEOUS) ×15 IMPLANT
TOWEL GREEN STERILE (TOWEL DISPOSABLE) ×5 IMPLANT
TUBING HIGH PRESSURE 120CM (CONNECTOR) ×5 IMPLANT
UNDERPAD 30X30 (UNDERPADS AND DIAPERS) ×5 IMPLANT
WATER STERILE IRR 1000ML POUR (IV SOLUTION) ×5 IMPLANT
WIRE G V18X300CM (WIRE) ×10 IMPLANT

## 2017-07-28 NOTE — Transfer of Care (Signed)
Immediate Anesthesia Transfer of Care Note  Patient: [facility]  Procedure(s) Performed: Procedure(s): AMPUTATION RIGHT Second TOE (Right) ANGIOGRAM  with Antegrade SFA  and Retrograde to anterior tibial. Angiogram  Right  Lower Extremity Insertion of Right Leg Stents times four SFA  and to proximal anterior tibial . (Right)  Patient Location: PACU  Anesthesia Type:General  Level of Consciousness: awake, alert  and oriented  Airway & Oxygen Therapy: Patient Spontanous Breathing  Post-op Assessment: Report given to RN and Post -op Vital signs reviewed and stable  Post vital signs: Reviewed and stable  Last Vitals:  Vitals:   07/28/17 0828 07/28/17 1333  BP: (!) 148/62   Pulse: 72   Resp:    Temp:  (!) 36.2 C  SpO2:      Last Pain:  Vitals:   07/28/17 0455  TempSrc: Oral  PainSc:       Patients Stated Pain Goal: 2 (00/76/22 6333)  Complications: No apparent anesthesia complications

## 2017-07-28 NOTE — Progress Notes (Signed)
ANTIBIOTIC CONSULT NOTE   Pharmacy Consult for Vanco Indication: Osteo  Allergies  Allergen Reactions  . Chantix [Varenicline] Other (See Comments)    Pt states it made him crazy    Patient Measurements: Height: 5\' 11"  (180.3 cm) Weight: 156 lb 12.8 oz (71.1 kg) (refused ) IBW/kg (Calculated) : 75.3 Adjusted Body Weight:    Vital Signs: Temp: 98 F (36.7 C) (09/20 1928) Temp Source: Oral (09/20 1928) BP: 128/55 (09/20 1928) Pulse Rate: 85 (09/20 1928) Intake/Output from previous day: 09/19 0701 - 09/20 0700 In: 1290 [P.O.:840; IV Piggyback:450] Out: 1525 [JSHFW:2637] Intake/Output from this shift: Total I/O In: 120 [P.O.:120] Out: -   Labs:  Recent Labs  07/26/17 0540 07/28/17 0336  WBC 10.9* 12.5*  HGB 9.6* 9.2*  PLT 149* 124*  CREATININE 0.97 0.86   Estimated Creatinine Clearance: 74.6 mL/min (by C-G formula based on SCr of 0.86 mg/dL).  Recent Labs  07/28/17 2108  [place] 22*     Microbiology:   Medical History: Past Medical History:  Diagnosis Date  . CAD (coronary artery disease) 07/2016   3 vessel disease,  evaluated by Dr Servando Snare and felt to be a poor surgical candidate  . Hypertension   . Ischemic cardiomyopathy 2017   EF 30%  . Tobacco abuse    Assessment: ID: [id number] for OM of toe, possible UTI - Afebrile, WBC 10.9>12.5. Arteriogram shows severe reduction in arterial flow at rest.  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  Jennifermarie Franzen S. Alford Highland, PharmD, BCPS Clinical Staff Pharmacist Pager [phone]  Eilene Ghazi Stillinger 07/28/2017,10:30 PM

## 2017-07-28 NOTE — Op Note (Signed)
Patient name: RINO HOSEA MRN: [id number] DOB: 1941-07-18 Sex: male  07/28/2017 Pre-operative Diagnosis: critical right lower extremity ischemia with toe gangrene Post-operative diagnosis:  Same Surgeon:  Erlene Quan C. Donzetta Matters, MD Procedure Performed: 1.  US guided cannulation of right common femoral artery in antegrade direction 2.  US guided cannulation of right AT at ankle 3.  Right lower extremity angiogram 4.  Stenting of right popliteal and sfa arteries with 5 x 150 viabahn x2, 6 x 150 viabahn and 6 x 50 viabahn stents 5.  Amputation of right 2nd toe  Indications:  76 year old male with history of critical right lower extremity ischemia and gangrene of his right second toe. He is undergone attempted endovascular intervention of his right lower extremity from the left common femoral approach the bifurcation could not be crossed. He is now indicated for an antegrade approach from the right side with possible retrograde right-sided intervention as well and concomitant secondary amputation.  Findings: The common femoral artery was severely diseased on the right and the SFA was occluded after it's takeoff. It also had multiple level high-grade stenoses. The popliteal artery had a flush occlusion above the knee reconstituted below the knee. There was initially three-vessel runoff to the foot. A completion the stenting of the right SFA and popliteal arteries was no residual stenosis. There was runoff to the ankle via the peroneal and posterior tibial arteries. Posterior tibial artery fed the medial and lateral plantar vessels which gave rise to the plantar arch. Due to tibial artery was no longer visible after being cannulated with wire present. There was a strong Doppler signal out distally on the foot from the medial plantar vessel and there was adequate bleeding to heal a toe amputation   Procedure:  The patient was identified in the holding area and taken to the operating room where Mac anesthesia  was induced. He was sterilely prepped and draped in the right lower extremity given antibiotics and timeout called. We began with ultrasound-guided evaluation of right common femoral artery this was cannulated with Vicryl puncture needle in an antegrade direction. We then placed a wire down the profunda under fluoroscopic guidance placed a micropuncture sheath. Unfortunately through the micro puncture sheath we could not get access to the SFA. micro puncture sheath was then removed and pressure held for 5 minutes. We then moved more cephalad and using combination of fluoroscopy and ultrasound identified a the most proximal aspect common femoral artery. This was then cannulated over lying the femoral head. The micropuncture wire was placed into the SFA followed by micropuncture sheath. A long Glidewire was then placed down the SFA and exchanged for 6 French sheath. Using a guidewire and quick cross catheter we were able to get into a subintimal plane and then into the real SFA above the knee. Angiogram demonstrated flush occlusion of the above-knee popliteal artery with a large side branch. We therefore cannot get any further some plane. We then made the decision to cannulate the anterior tibial artery. Ultrasound was used to identify the small anterior tibial artery just above the ankle. We initially did cannulated by the pass a micropuncture sheath but could not. We then recannulated higher on the ankle. A micropuncture sheath was placed which did have antegrade bleeding. We then followed this with a V 18 wire and exchanged for a CXI catheter. We able to get up behind the knee but could not cross retrograde. We then used a common facial antegrade retrograde wires were able to get  the Glidewire and quick cross catheter from above into the below-knee popliteal artery and confirmed intraluminal access. We then snared our V 18 wire from above and pulled through and a body floss technique. We then predilated the entire  popliteal and SFA arteries with a 5 x 40 balloon. Primary stenting of the popliteal and SFA were then done with sequential viabahn stents. These were postdilated with a 5 mm balloon. Completion angiogram as above demonstrated flow through the stents with no residual stenosis and dominant flow to the foot being via the posterior tibial and peroneal artery also to the ankle. The anterior tibial artery was no longer demonstrated and a wire was in place. Satisfied with this we removed the wire and the patient was given 25 mg of protamine sheath was removed and pressure held for a period of 15 minutes until hemostasis obtained.  This time we turned our attention to the right foot. A fenestrated type incision was made around second toe resected down to the level of the bladder sharply and did have adequate bleeding there. I did check signals with the Doppler demonstrated above all the way to the distal foot and a multiphasic signal at the ankle posterior tibial artery. We dissected out the second metatarsal phalangeal joint and then removed the joint capsule with frondular. The toe was passed the specimen. Hematemesis was obtained in the wound and was irrigated. The wound was then closed with interrupted 3-0 nylon sutures. All counts were correct at completion. Patient tolerated procedure well without immediate complication.   Contrast: 100cc    Yolande Skoda C. Donzetta Matters, MD [facility] Office: [phone] Pager: [phone]

## 2017-07-28 NOTE — Progress Notes (Signed)
Pt returns from surgery with bilateral femoral punctures, sealed with liquid-bandage. Pt had right toe amputation, right foot/ankle wrapped with ace bandage; dopplers checked in PACU no concerns.

## 2017-07-28 NOTE — Anesthesia Procedure Notes (Signed)
Procedure Name: LMA Insertion Date/Time: 07/28/2017 9:44 AM Performed by: Eligha Bridegroom Pre-anesthesia Checklist: Patient identified, Emergency Drugs available, Suction available, Patient being monitored and Timeout performed Patient Re-evaluated:Patient Re-evaluated prior to induction Oxygen Delivery Method: Circle system utilized Preoxygenation: Pre-oxygenation with 100% oxygen Induction Type: IV induction LMA: LMA inserted LMA Size: 4.5 Number of attempts: 1 Placement Confirmation: positive ETCO2 and breath sounds checked- equal and bilateral Tube secured with: Tape Dental Injury: Teeth and Oropharynx as per pre-operative assessment

## 2017-07-28 NOTE — Anesthesia Preprocedure Evaluation (Addendum)
Anesthesia Evaluation  Patient identified by MRN, date of birth, ID band Patient awake    Reviewed: Allergy & Precautions, NPO status , Patient's Chart, lab work & pertinent test results  Airway Mallampati: III  TM Distance: >3 FB Neck ROM: Full    Dental  (+) Poor Dentition, Missing, Edentulous Upper,    Pulmonary COPD, Current Smoker,  Room air sats 100%   Pulmonary exam normal breath sounds clear to auscultation       Cardiovascular hypertension, Pt. on medications + angina + CAD, + Past MI and +CHF  Normal cardiovascular exam Rhythm:Regular Rate:Normal  ECG: SR, PAC's, rate 86  LV EF: 55%  Ost RCA lesion, 95 %stenosed. Prox RCA lesion, 100 %stenosed. Ost LM to LM lesion, 70 %stenosed. Ost Cx to Prox Cx lesion, 75 %stenosed. Ost LAD to Prox LAD lesion, 70 %stenosed. Ost 1st Diag to 1st Diag lesion, 70 %stenosed. Ost 2nd Diag to 2nd Diag lesion, 70 %stenosed. Ost 1st Sept to 1st Sept lesion, 90 %stenosed. Hemodynamic findings consistent with mild pulmonary hypertension.   Severe ischemic cardiomyopathy with diffuse hypocontractility and hypo-to akinesis in the mid- basal inferior wall with a global ejection fraction of of 25-30%.  Severe multivessel CAD with 70% distal left main stenosis, 70% proximal LAD, diagonal 1 and diagonal 2 stenoses with 90% septal perforating artery stenoses; 75% proximal left circumflex stenoses, and total occlusion of the very proximal RCA with significant left to right collaterals.    Neuro/Psych negative neurological ROS  negative psych ROS   GI/Hepatic negative GI ROS, Neg liver ROS,   Endo/Other  negative endocrine ROS  Renal/GU negative Renal ROS     Musculoskeletal negative musculoskeletal ROS (+)   Abdominal   Peds  Hematology  (+) anemia ,   Anesthesia Other Findings HLD  Reproductive/Obstetrics                            Anesthesia  Physical Anesthesia Plan  ASA: IV  Anesthesia Plan: General   Post-op Pain Management:    Induction: Intravenous  PONV Risk Score and Plan: 1 and Ondansetron and Dexamethasone  Airway Management Planned: LMA  Additional Equipment:   Intra-op Plan:   Post-operative Plan: Extubation in OR  Informed Consent: I have reviewed the patients History and Physical, chart, labs and discussed the procedure including the risks, benefits and alternatives for the proposed anesthesia with the patient or authorized representative who has indicated his/her understanding and acceptance.   Dental advisory given  Plan Discussed with: CRNA  Anesthesia Plan Comments:        Anesthesia Quick Evaluation

## 2017-07-28 NOTE — Progress Notes (Signed)
  Progress Note    07/28/2017 8:54 AM Day of Surgery  Subjective:  No new complaints.  Vitals:   07/28/17 0455 07/28/17 0828  BP: (!) 125/53 (!) 148/62  Pulse: 80 72  Resp: 18   Temp: 98.2 F (36.8 C)   SpO2: 98%     Physical Exam: aaox3 Abdomen is soft No hematoma left groin 1+ right femoral pulse Dry gangrene tip right 2nd toe  CBC    Component Value Date/Time   WBC 12.5 (H) 07/28/2017 0336   RBC 3.62 (L) 07/28/2017 0336   HGB 9.2 (L) 07/28/2017 0336   HGB 12.5 (L) 08/30/2012 1348   HCT 27.9 (L) 07/28/2017 0336   HCT 37.4 (L) 08/30/2012 1348   PLT 124 (L) 07/28/2017 0336   PLT 68 (L) 08/30/2012 1348   MCV 77.1 (L) 07/28/2017 0336   MCV 91.2 08/30/2012 1348   MCH 25.4 (L) 07/28/2017 0336   MCHC 33.0 07/28/2017 0336   RDW 18.8 (H) 07/28/2017 0336   RDW 16.0 (H) 08/30/2012 1348   LYMPHSABS 4.1 (H) 07/19/2016 0302   LYMPHSABS 3.3 08/30/2012 1348   MONOABS 1.2 (H) 07/19/2016 0302   MONOABS 0.9 08/30/2012 1348   EOSABS 0.3 07/19/2016 0302   EOSABS 0.2 08/30/2012 1348   BASOSABS 0.0 07/19/2016 0302   BASOSABS 0.0 08/30/2012 1348    BMET    Component Value Date/Time   NA 134 (L) 07/28/2017 0336   NA 135 (L) 08/30/2012 1348   K 3.6 07/28/2017 0336   K 4.5 08/30/2012 1348   CL 105 07/28/2017 0336   CL 103 08/30/2012 1348   CO2 20 (L) 07/28/2017 0336   CO2 23 08/30/2012 1348   GLUCOSE 97 07/28/2017 0336   GLUCOSE 107 (H) 08/30/2012 1348   BUN 14 07/28/2017 0336   BUN 10.0 08/30/2012 1348   CREATININE 0.86 07/28/2017 0336   CREATININE 0.8 08/30/2012 1348   CALCIUM 8.6 (L) 07/28/2017 0336   CALCIUM 9.3 08/30/2012 1348   GFRNONAA >60 07/28/2017 0336   GFRAA >60 07/28/2017 0336    INR    Component Value Date/Time   INR 1.19 07/28/2017 0336     Intake/Output Summary (Last 24 hours) at 07/28/17 0854 Last data filed at 07/28/17 0728  Gross per 24 hour  Intake             1290 ml  Output             1800 ml  Net             -510 ml      Assessment:  76 y.o. male is here with dry gangrene of right 2nd toe. Plan: OR today for antegrade and possible retrograde intervention on right and possible 2nd toe amputation.   Clayburn Weekly C. Donzetta Matters, MD [facility] Office: [phone] Pager: [phone]  07/28/2017 8:54 AM

## 2017-07-28 NOTE — Progress Notes (Signed)
[facility] PROGRESS NOTE  Vincent Dunlap QJJ:[id number] DOB: 12-30-1940 DOA: 07/16/2017 PCP: Thressa Sheller, MD  Interim summary and history of present illness 76 y.o. male with medical history significant of hypertension, hyperlipidemia, COPD, GERD, depression, formal smoker, sCHF with EF of 30-35%, CAD (multivessel disease, but not a candidate for CABG), chronic thrombocytopenia, who presents with foot pain, chest pain, and dysuria. Patient found to have sepsis most likely due to UTI and osteomyelitis on his right second toe.  Assessment/Plan: 1-sepsis secondary to osteomyelitis and UTI -Patient responded well to IV fluid resuscitation and the use of broad-spectrum antibiotics -overall hemodynamically stable at this time -Will follow results from blood cultures and urine culture -Continue vanc and zosyn -Assessment of vasculature integrity on his right leg with ABI demonstarted severe reduction in arterial flow on right and moderate reduction on left.    -Vascular surgery consult requested: Dr. Donnetta Hutching saw him and because he has severe coronary disease and base on ABI results found to be not a candidate for revascularization. Explained that his only option would be endovascular. Initial approach failed and patient will require return for anterior approach; this is plan for today and there is a potential change for his right second toe to be amputated as well.  2-intermittent chest discomfort in patient with history of coronary artery disease: Patient had a history of known coronary artery disease (multiple vessels) found not a candidate for CABG. -He has been experiencing stable angina for years; which are essentially unchanged. -Continue conservative management with the use of aspirin, statins, beta blocker and imdur.  -Cardiology has seen patient and recommended no further ischemic workup at this moment. -neg troponin X3; no CP  3-history of COPD -No wheezing and with good air movement  bilaterally  -Will continue as needed albuterol nebulizer -cessation counseling ongoing  4-chronic idiopathic thrombocytopenia -No signs of acute bleeding -continue monitoring platelets count; improved overall  5-tobacco abuse -Cessation counseling encouraged -Nicotine patch has been ordered  6-hypertension -BP is stable and patient reported no further orthostatic symptoms -following cardiology recs will continue coreg, lisinopril, imdur  7-hyperlipidemia -will continue lipitor 80 mg daily -liver function is stable   8-stage 2 sacral pressure injury -continue preventive measures -overlay mattress ordered   9-mild hyponatremia:  -sodium remains stable at 134 -eating and drinking ok -will monitor electrolytes   Code Status: Full code Family Communication: None at bedside Disposition Plan: patient to go home with [facility] services at discharge   Consultants:  Neurology service  Orthopedic service  Vascular surgery service  Procedures:  See below for x-ray reports   2-D echo - Procedure narrative: Transthoracic echocardiography. Image   quality was suboptimal. The study was technically difficult, as a  result of poor acoustic windows, poor sound wave transmission,  and body habitus. - Left ventricle: The cavity size was normal. Systolic function was normal. The estimated ejection fraction was 55%. Doppler parameters are consistent with abnormal left ventricular relaxation (grade 1 diastolic dysfunction). - Regional wall motion abnormality: Hypokinesis of the basal inferior myocardium. - Aortic valve: Mildly thickened, mildly calcified leaflets. There was mild regurgitation. - Mitral valve: Mildly thickened leaflets . There was mild to moderate regurgitation. - Right ventricle: Systolic function was mildly reduced. - Tricuspid valve: There was mild regurgitation.   Right lower extremity ABI  Planning anterior approach endovascular revascularization and right second toe  amputation.  Antibiotics:  Vancomycin and Zosyn 9/8  HPI/Subjective: Afebrile, no CP, no SOB, no nausea, no vomiting. Continue having pain  on his right foot (htrobbing, intermittent). Patient is ready for surgical procedure planned for today and would like if possible to get rid off his necrotic toe.   Objective: Vitals:   07/28/17 1420 07/28/17 1457  BP: 110/66 (!) 141/73  Pulse: 68 68  Resp: 12 16  Temp:    SpO2: 100% 100%    Intake/Output Summary (Last 24 hours) at 07/28/17 1756 Last data filed at 07/28/17 1345  Gross per 24 hour  Intake             1690 ml  Output              985 ml  Net              705 ml   Filed Weights   07/26/17 0536 07/27/17 0613 07/28/17 0455  Weight: 69.7 kg (153 lb 11.2 oz) 72.6 kg (160 lb 1.6 oz) 71.1 kg (156 lb 12.8 oz)    Exam:   General:  No fever, no CP, no SOB> continue complaining of intermittent right foot pain.  Rest of his medical exam remains stable and unchanged from exam on 07/27/17; see below for details:  Cardiovascular: S1 and S2, no rubs, no gallops; no JVD.  Respiratory: normal resp effort, no wheezing, no crackles    Abdomen: soft, NT, ND, positive BD  Musculoskeletal: trace edema bilaterally; no erythema appreciated, patient;s right second toe with necrotic tip, no discharges; wound is clean and dry.  Skin: stage 2 sacral pressure injury, no visible superimposed infection.  Data Reviewed: Basic Metabolic Panel:  Recent Labs Lab 07/22/17 0429 07/23/17 0553 07/25/17 0404 07/26/17 0540 07/28/17 0336  NA 132* 132* 133* 134* 134*  K 4.3 4.0 4.0 3.8 3.6  CL 104 104 103 105 105  CO2 23 22 23  21* 20*  GLUCOSE 93 85 89 94 97  BUN 13 12 16 14 14   CREATININE 1.10 0.91 1.02 0.97 0.86  CALCIUM 8.7* 8.7* 8.7* 9.0 8.6*   CBC:  Recent Labs Lab 07/22/17 0429 07/23/17 0553 07/25/17 0404 07/26/17 0540 07/28/17 0336  WBC 11.7* 11.7* 10.5 10.9* 12.5*  HGB 9.4* 9.8* 9.1* 9.6* 9.2*  HCT 28.8* 29.7* 28.0* 29.4*  27.9*  MCV 76.8* 76.9* 76.7* 76.6* 77.1*  PLT 146* 143* 156 149* 124*   BNP (last 3 results)  Recent Labs  07/17/17 0317  BNP 145.4*   CBG:  Recent Labs Lab 07/22/17 0536 07/24/17 0652 07/25/17 0606 07/28/17 0727  GLUCAP 93 116* 92 94   Studies: No results found.  Scheduled Meds: . aspirin  81 mg Oral Daily  . atorvastatin  80 mg Oral q1800  . carvedilol  6.25 mg Oral BID WC  . clopidogrel  300 mg Oral Once   Followed by  . [START ON 07/29/2017] clopidogrel  75 mg Oral Q breakfast  . feeding supplement (ENSURE ENLIVE)  237 mL Oral BID BM  . isosorbide mononitrate  30 mg Oral Q24H  . lisinopril  2.5 mg Oral Daily  . multivitamin with minerals  1 tablet Oral Daily  . nicotine  14 mg Transdermal Daily  . polyethylene glycol  17 g Oral BID  . senna-docusate  1 tablet Oral QHS  . sodium chloride flush  3 mL Intravenous Q12H  . [START ON 07/29/2017] sodium chloride flush  3 mL Intravenous Q12H   Continuous Infusions: . sodium chloride    . sodium chloride    . sodium chloride    . lactated ringers    .  piperacillin-tazobactam (ZOSYN)  IV Stopped (07/28/17 1034)  . vancomycin 0 mg (07/27/17 2244)    Time spent: 25 minutes  Barton Dubois  [facility] Pager [phone]. If 7PM-7AM, please contact night-coverage at [email or web address], password [facility] 07/28/2017, 5:56 PM  LOS: 11 days

## 2017-07-28 NOTE — Progress Notes (Addendum)
Transport here for pt, to surgery.  Report called to surgery. Pt is non-tele.

## 2017-07-29 ENCOUNTER — Telehealth: Payer: Self-pay | Admitting: Vascular Surgery

## 2017-07-29 DIAGNOSIS — I10 Essential (primary) hypertension: Secondary | ICD-10-CM

## 2017-07-29 MED ORDER — DOXYCYCLINE HYCLATE 100 MG PO TABS
100.0000 mg | ORAL_TABLET | Freq: Two times a day (BID) | ORAL | Status: DC
  Administered 2017-07-29 – 2017-07-30 (×3): 100 mg via ORAL
  Filled 2017-07-29 (×4): qty 1

## 2017-07-29 MED ORDER — SENNOSIDES-DOCUSATE SODIUM 8.6-50 MG PO TABS
1.0000 | ORAL_TABLET | Freq: Two times a day (BID) | ORAL | Status: DC
  Administered 2017-07-29 – 2017-07-30 (×2): 1 via ORAL
  Filled 2017-07-29 (×2): qty 1

## 2017-07-29 MED ORDER — BISACODYL 10 MG RE SUPP
10.0000 mg | Freq: Every day | RECTAL | Status: DC | PRN
Start: 2017-07-29 — End: 2017-07-30

## 2017-07-29 NOTE — Progress Notes (Signed)
[facility] PROGRESS NOTE  Vincent Dunlap JEH:[id number] DOB: 08-Jul-1941 DOA: 07/16/2017 PCP: Thressa Sheller, MD  Interim summary and history of present illness 76 y.o. male with medical history significant of hypertension, hyperlipidemia, COPD, GERD, depression, formal smoker, sCHF with EF of 30-35%, CAD (multivessel disease, but not a candidate for CABG), chronic thrombocytopenia, who presents with foot pain, chest pain, and dysuria. Patient found to have sepsis most likely due to UTI and osteomyelitis on his right second toe.  Assessment/Plan: 1-sepsis secondary to osteomyelitis and UTI -Patient responded well to IV fluid resuscitation and the use of broad-spectrum antibiotics -overall hemodynamically stable at this time -blood cx's and urine cx's w/o any specific growth  -after amputation and in absence of systemic infection will stop IV antibiotics and complete therapy with 1 week of doxycycline (providing protection to amputated wound)  -Assessment of vasculature integrity on his right leg with ABI demonstarted severe reduction in arterial flow on right and moderate reduction on left.  -patient s/p endovascular revascularization and 2nd right toe amputation by vascular surgery -will follow post operative rec's    2-intermittent chest discomfort in patient with history of coronary artery disease: Patient had a history of known coronary artery disease (multiple vessels) found not a candidate for CABG. -He has been experiencing stable angina for years; which are essentially unchanged. -Cardiology has seen patient and recommended no further ischemic workup at this moment. -neg troponin X 3; no CP or SOB at rest -Continue conservative management with the use of aspirin, statins, beta blocker and imdur.  3-history of COPD -No wheezing and with good air movement bilaterally  -Will continue as needed albuterol nebulizer -cessation counseling ongoing  4-chronic idiopathic  thrombocytopenia -No signs of acute bleeding -continue monitoring platelets count -maybe worse with recent burst of infections   5-tobacco abuse -Cessation counseling encouraged -Nicotine patch has been ordered  6-hypertension -BP is stable and patient reported no further orthostatic symptoms -following cardiology recs/instructions will continue coreg, lisinopril, imdur  7-hyperlipidemia -will continue lipitor 80 mg daily -liver function is stable   8-stage 2 sacral pressure injury -continue preventive measures -overlay mattress ordered   9-mild hyponatremia:  -sodium remains stable at 134 -eating and drinking ok -will monitor electrolytes intermittently   10-constipation -will use Miralax, senokot and if needed PRN dulcolax.  Code Status: Full code Family Communication: None at bedside Disposition Plan: patient to go home with [facility] services at discharge. Waiting on clearance form vascular surgery for discharge. Will stop IV antibiotics and use doxycycline for 7 days.    Consultants:  Neurology service  Orthopedic service  Vascular surgery service  Procedures:  See below for x-ray reports   2-D echo - Procedure narrative: Transthoracic echocardiography. Image   quality was suboptimal. The study was technically difficult, as a  result of poor acoustic windows, poor sound wave transmission,  and body habitus. - Left ventricle: The cavity size was normal. Systolic function was normal. The estimated ejection fraction was 55%. Doppler parameters are consistent with abnormal left ventricular relaxation (grade 1 diastolic dysfunction). - Regional wall motion abnormality: Hypokinesis of the basal inferior myocardium. - Aortic valve: Mildly thickened, mildly calcified leaflets. There was mild regurgitation. - Mitral valve: Mildly thickened leaflets . There was mild to moderate regurgitation. - Right ventricle: Systolic function was mildly reduced. - Tricuspid valve: There  was mild regurgitation.   Right lower extremity ABI  S/P anterior approach endovascular revascularization and right second toe amputation. 9/20  Antibiotics:  Vancomycin and Zosyn 9/8>>9/21  Doxycycline planning for 7 days treatment   HPI/Subjective: Afebrile, no CP, no SOB. Patient still with pain on his right foot, but reported improvement. tolerated vascular procedure and 2nd right toe amputation well.   Objective: Vitals:   07/28/17 1928 07/29/17 0502  BP: (!) 128/55 135/69  Pulse: 85 88  Resp: 18 18  Temp: 98 F (36.7 C) 98.1 F (36.7 C)  SpO2: 98% 98%    Intake/Output Summary (Last 24 hours) at 07/29/17 1754 Last data filed at 07/29/17 1753  Gross per 24 hour  Intake             1260 ml  Output             1625 ml  Net             -365 ml   Filed Weights   07/27/17 0613 07/28/17 0455 07/29/17 0502  Weight: 72.6 kg (160 lb 1.6 oz) 71.1 kg (156 lb 12.8 oz) 72.4 kg (159 lb 9.8 oz)    Exam:   General:  Afebrile, no CP, no SOB. Reports pain in his right foot, but improved. Reported constipation.  Cardiovascular: S1 and S2, no rubs, no gallops, no JVD.  Respiratory: normal resp effort, no wheezing, no crackles    Abdomen: soft, NT, positive BS, no masses  Musculoskeletal: no edema, no cyanosis, RLE with weak pulses on exam, feeling warm and with better color now. Also with clean dressing on right foot after having 2 toe amputated..   Skin: stage 2 sacral pressure injury, no superimposed infection appreciated.   Data Reviewed: Basic Metabolic Panel:  Recent Labs Lab 07/23/17 0553 07/25/17 0404 07/26/17 0540 07/28/17 0336  NA 132* 133* 134* 134*  K 4.0 4.0 3.8 3.6  CL 104 103 105 105  CO2 22 23 21* 20*  GLUCOSE 85 89 94 97  BUN 12 16 14 14   CREATININE 0.91 1.02 0.97 0.86  CALCIUM 8.7* 8.7* 9.0 8.6*   CBC:  Recent Labs Lab 07/23/17 0553 07/25/17 0404 07/26/17 0540 07/28/17 0336  WBC 11.7* 10.5 10.9* 12.5*  HGB 9.8* 9.1* 9.6* 9.2*  HCT  29.7* 28.0* 29.4* 27.9*  MCV 76.9* 76.7* 76.6* 77.1*  PLT 143* 156 149* 124*   BNP (last 3 results)  Recent Labs  07/17/17 0317  BNP 145.4*   CBG:  Recent Labs Lab 07/24/17 0652 07/25/17 0606 07/28/17 0727  GLUCAP 116* 92 94   Studies: No results found.  Scheduled Meds: . aspirin  81 mg Oral Daily  . atorvastatin  80 mg Oral q1800  . carvedilol  6.25 mg Oral BID WC  . clopidogrel  300 mg Oral Once   Followed by  . clopidogrel  75 mg Oral Q breakfast  . doxycycline  100 mg Oral Q12H  . feeding supplement (ENSURE ENLIVE)  237 mL Oral BID BM  . isosorbide mononitrate  30 mg Oral Q24H  . lisinopril  2.5 mg Oral Daily  . multivitamin with minerals  1 tablet Oral Daily  . nicotine  14 mg Transdermal Daily  . polyethylene glycol  17 g Oral BID  . senna-docusate  1 tablet Oral BID  . sodium chloride flush  3 mL Intravenous Q12H  . sodium chloride flush  3 mL Intravenous Q12H   Continuous Infusions: . sodium chloride    . sodium chloride    . lactated ringers      Time spent: 25 minutes  Barton Dubois  [facility] Pager [phone]. If 7PM-7AM, please  contact night-coverage at [email or web address], password [facility] 07/29/2017, 5:54 PM  LOS: 12 days

## 2017-07-29 NOTE — Telephone Encounter (Signed)
Sched labs 08/19/17 at 3:00 and MD 08/26/17 at 10:00. Lm on hm#.

## 2017-07-29 NOTE — Progress Notes (Signed)
Orthopedic Tech Progress Note Patient Details:  Vincent Dunlap Oct 20, 1941 [id number]  Ortho Devices Type of Ortho Device: Postop Sandin/boot Ortho Device/Splint Location: rle Ortho Device/Splint Interventions: Application   Vincent Dunlap 07/29/2017, 12:30 PM

## 2017-07-29 NOTE — Progress Notes (Signed)
PT Cancellation Note  Patient Details Name: Vincent Dunlap MRN: [id number] DOB: 1941/04/06   Cancelled Treatment:    Reason Eval/Treat Not Completed: Patient declined, no reason specified Pt declined mobility due to feeling "woozy" from pain medication. PT will continue to follow acutely.    Salina April, PTA Pager: [phone]   07/29/2017, 3:19 PM

## 2017-07-29 NOTE — Care Management Note (Signed)
Case Management Note  Patient Details  Name: Vincent Dunlap MRN: [id number] Date of Birth: 25-Mar-1941  Subjective/Objective: Osteomyelitis of the toe                 Action/Plan: Patient lives at home, he states that he has several people in the home that can help him if needed; PCP: Thressa Sheller, MD; has private insurance with [facility] with prescription drug coverage; pharmacy of choice is [facility]; patient is refusing SNF placement at this time and is requesting to go home at discharge; [facility] choice offered, pt chose [facility] [facility]); Tim with [facility] called for arrangements.  Expected Discharge Date:     Possibly 07/30/2017            Expected Discharge Plan:  [place]  In-House Referral:   [facility]  Discharge planning Services  CM Consult  Choice offered to:  Patient  HH Arranged:   RN/ PT/Aide [facility] Agency:  [facility])  Status of Service:  In process, will continue to follow  Sherrilyn Rist [phone] 07/29/2017, 1:41 PM

## 2017-07-29 NOTE — Progress Notes (Signed)
  Progress Note    07/29/2017 7:28 AM 1 Day Post-Op  Subjective:  Moderate right foot pain  Vitals:   07/28/17 1928 07/29/17 0502  BP: (!) 128/55 135/69  Pulse: 85 88  Resp: 18 18  Temp: 98 F (36.7 C) 98.1 F (36.7 C)  SpO2: 98% 98%    Physical Exam: [email or web address] Right groin is soft Strong right pt signal Toe amp site in tact with sutures  CBC    Component Value Date/Time   WBC 12.5 (H) 07/28/2017 0336   RBC 3.62 (L) 07/28/2017 0336   HGB 9.2 (L) 07/28/2017 0336   HGB 12.5 (L) 08/30/2012 1348   HCT 27.9 (L) 07/28/2017 0336   HCT 37.4 (L) 08/30/2012 1348   PLT 124 (L) 07/28/2017 0336   PLT 68 (L) 08/30/2012 1348   MCV 77.1 (L) 07/28/2017 0336   MCV 91.2 08/30/2012 1348   MCH 25.4 (L) 07/28/2017 0336   MCHC 33.0 07/28/2017 0336   RDW 18.8 (H) 07/28/2017 0336   RDW 16.0 (H) 08/30/2012 1348   LYMPHSABS 4.1 (H) 07/19/2016 0302   LYMPHSABS 3.3 08/30/2012 1348   MONOABS 1.2 (H) 07/19/2016 0302   MONOABS 0.9 08/30/2012 1348   EOSABS 0.3 07/19/2016 0302   EOSABS 0.2 08/30/2012 1348   BASOSABS 0.0 07/19/2016 0302   BASOSABS 0.0 08/30/2012 1348    BMET    Component Value Date/Time   NA 134 (L) 07/28/2017 0336   NA 135 (L) 08/30/2012 1348   K 3.6 07/28/2017 0336   K 4.5 08/30/2012 1348   CL 105 07/28/2017 0336   CL 103 08/30/2012 1348   CO2 20 (L) 07/28/2017 0336   CO2 23 08/30/2012 1348   GLUCOSE 97 07/28/2017 0336   GLUCOSE 107 (H) 08/30/2012 1348   BUN 14 07/28/2017 0336   BUN 10.0 08/30/2012 1348   CREATININE 0.86 07/28/2017 0336   CREATININE 0.8 08/30/2012 1348   CALCIUM 8.6 (L) 07/28/2017 0336   CALCIUM 9.3 08/30/2012 1348   GFRNONAA >60 07/28/2017 0336   GFRAA >60 07/28/2017 0336    INR    Component Value Date/Time   INR 1.19 07/28/2017 0336     Intake/Output Summary (Last 24 hours) at 07/29/17 0728 Last data filed at 07/29/17 0529  Gross per 24 hour  Intake             1600 ml  Output             1410 ml  Net              190 ml      Assessment:  76 y.o. male is s/pstent of right sfa/popliteal arteries and right 2nd toe amputation  Plan: Needs post op Fray Will need plavix No further intervention from vascular standpoint F/u in 2 weeks with Right leg duplex and ABI   Vincent Dunlap C. Donzetta Matters, MD [facility] Office: [phone] Pager: [phone]  07/29/2017 7:28 AM

## 2017-07-29 NOTE — Anesthesia Postprocedure Evaluation (Signed)
Anesthesia Post Note  Patient: Vincent Dunlap  Procedure(s) Performed: Procedure(s) (LRB): AMPUTATION RIGHT Second TOE (Right) ANGIOGRAM  with Antegrade SFA  and Retrograde to anterior tibial. Angiogram  Right  Lower Extremity Insertion of Right Leg Stents times four SFA  and to proximal anterior tibial . (Right)     Patient location during evaluation: PACU Anesthesia Type: General Level of consciousness: awake and alert Pain management: pain level controlled Vital Signs Assessment: post-procedure vital signs reviewed and stable Respiratory status: spontaneous breathing, nonlabored ventilation, respiratory function stable and patient connected to nasal cannula oxygen Cardiovascular status: blood pressure returned to baseline and stable Postop Assessment: no apparent nausea or vomiting Anesthetic complications: no    Last Vitals:  Vitals:   07/28/17 1928 07/29/17 0502  BP: (!) 128/55 135/69  Pulse: 85 88  Resp: 18 18  Temp: 36.7 C 36.7 C  SpO2: 98% 98%    Last Pain:  Vitals:   07/29/17 0502  TempSrc: Oral  PainSc:                  Vincent Dunlap [name]

## 2017-07-29 NOTE — Telephone Encounter (Signed)
-----   Message from Mena Goes, RN sent at 07/29/2017  8:16 AM EDT ----- Regarding: 2-3 weeks w/ labs   ----- Message ----- From: Waynetta Sandy, MD Sent: 07/29/2017   7:35 AM To: [address]  Vincent Dunlap [id number] Nov 08, 1941  F/u in 2-3 weeks for wound check with ABI and right leg duplex

## 2017-07-30 DIAGNOSIS — E43 Unspecified severe protein-calorie malnutrition: Secondary | ICD-10-CM

## 2017-07-30 LAB — BASIC METABOLIC PANEL
Anion gap: 7 (ref 5–15)
BUN: 13 mg/dL (ref 6–20)
CALCIUM: 8.6 mg/dL — AB (ref 8.9–10.3)
CO2: 22 mmol/L (ref 22–32)
CREATININE: 0.81 mg/dL (ref 0.61–1.24)
Chloride: 106 mmol/L (ref 101–111)
GFR calc Af Amer: >60 mL/min (ref >60)
GFR calc non Af Amer: >60 mL/min (ref >60)
GLUCOSE: 100 mg/dL — AB (ref 65–99)
Potassium: 3.8 mmol/L (ref 3.5–5.1)
Sodium: 135 mmol/L (ref 135–145)

## 2017-07-30 LAB — CBC
HEMATOCRIT: 23.7 % — AB (ref 39.0–52.0)
Hemoglobin: 7.6 g/dL — ABNORMAL LOW (ref 13.0–17.0)
MCH: 24.9 pg — ABNORMAL LOW (ref 26.0–34.0)
MCHC: 32.1 g/dL (ref 30.0–36.0)
MCV: 77.7 fL — AB (ref 78.0–100.0)
PLATELETS: 103 10*3/uL — AB (ref 150–400)
RBC: 3.05 MIL/uL — ABNORMAL LOW (ref 4.22–5.81)
RDW: 19 % — AB (ref 11.5–15.5)
WBC: 11.1 10*3/uL — ABNORMAL HIGH (ref 4.0–10.5)

## 2017-07-30 MED ORDER — ACETAMINOPHEN 325 MG PO TABS: 650.0000 mg | ORAL_TABLET | ORAL | 0 refills | Status: DC | PRN

## 2017-07-30 MED ORDER — DOXYCYCLINE HYCLATE 100 MG PO TABS: 100.0000 mg | ORAL_TABLET | Freq: Two times a day (BID) | ORAL | 0 refills | Status: DC

## 2017-07-30 MED ORDER — ISOSORBIDE MONONITRATE ER 30 MG PO TB24: 30.0000 mg | ORAL_TABLET | ORAL | 1 refills | Status: AC

## 2017-07-30 MED ORDER — POLYETHYLENE GLYCOL 3350 17 G PO PACK: 17.0000 g | PACK | Freq: Two times a day (BID) | ORAL | 1 refills | Status: DC

## 2017-07-30 MED ORDER — CLOPIDOGREL BISULFATE 75 MG PO TABS: 75.0000 mg | ORAL_TABLET | Freq: Every day | ORAL | 2 refills | Status: AC

## 2017-07-30 MED ORDER — OXYCODONE HCL 5 MG PO TABS: 5.0000 mg | ORAL_TABLET | Freq: Three times a day (TID) | ORAL | 0 refills | Status: DC | PRN

## 2017-07-30 MED ORDER — ASPIRIN 81 MG PO CHEW: 81.0000 mg | CHEWABLE_TABLET | Freq: Every day | ORAL | 1 refills | Status: AC

## 2017-07-30 MED ORDER — ENSURE ENLIVE PO LIQD: 237.0000 mL | Freq: Two times a day (BID) | ORAL | 12 refills | Status: DC

## 2017-07-30 MED ORDER — NICOTINE 14 MG/24HR TD PT24: 14.0000 mg | MEDICATED_PATCH | Freq: Every day | TRANSDERMAL | 0 refills | Status: DC

## 2017-07-30 MED ORDER — BISACODYL 10 MG RE SUPP: 10.0000 mg | Freq: Every day | RECTAL | 0 refills | Status: AC | PRN

## 2017-07-30 NOTE — Progress Notes (Signed)
PT Cancellation Note  Patient Details Name: Vincent Dunlap MRN: [id number] DOB: 07/07/1941   Cancelled Treatment:    Reason Eval/Treat Not Completed: Patient declined, no reason specified Pt declined mobility despite max encouragement. PT will continue to follow acutely.    Salina April, PTA Pager: [phone]   07/30/2017, 1:15 PM

## 2017-07-30 NOTE — Discharge Summary (Signed)
Physician Discharge Summary  Vincent Dunlap VQM:[id number] DOB: Jul 21, 1941 DOA: 07/16/2017  PCP: Thressa Sheller, MD  Admit date: 07/16/2017 Discharge date: 07/30/2017  Time spent: 35 minutes  Recommendations for Outpatient Follow-up:  Repeat BMET to follow electrolytes and renal function  Reassess wound and make sure patient has follow up with vascular surgery service Repeat CBC to follow Hgb and platelets trend.  Discharge Diagnoses:  Principal Problem:   Osteomyelitis of toe of right foot ([place]) Active Problems:   CAD (coronary artery disease), native coronary artery   Chronic systolic CHF (congestive heart failure) (HCC)   COPD (chronic obstructive pulmonary disease) (HCC)   Chronic idiopathic thrombocytopenia (HCC)   UTI (urinary tract infection)   Sepsis (HCC)   Hypotension   Chest pain   Pressure injury of skin   Severe protein-calorie malnutrition ([place])   Discharge Condition: stable and improved. Discharged home with home health services; patient instructed to follow up with PCP in 10 days and with vascular surgery as instructed.  Diet recommendation: heart healthy and modified carbohydrates diet   Filed Weights   07/28/17 0455 07/29/17 0502 07/30/17 0624  Weight: 71.1 kg (156 lb 12.8 oz) 72.4 kg (159 lb 9.8 oz) 76.8 kg (169 lb 4.8 oz)    History of present illness:  76 y.o.malewith medical history significant of hypertension, hyperlipidemia, COPD, GERD, depression, formal smoker, sCHF with EF of 30-35%, CAD (multivessel disease, but not a candidate for CABG), chronic thrombocytopenia, who presents with foot pain, chest pain, and dysuria. Patient found to have sepsis most likely due to UTI and osteomyelitis on his right second toe.  Hospital Course:  1-sepsis secondary to osteomyelitis and UTI -Patient responded well to IV fluid resuscitation and the use of broad-spectrum antibiotics -overall hemodynamically stable at this time -blood cx's and urine cx's w/o any  specific growth  -after amputation and in absence of systemic infection will stop IV antibiotics and complete therapy with 1 week of doxycycline (providing protection to amputated wound)  -Assessment of vasculature integrity on his right leg with ABI demonstarted severe reduction in arterial flow on right and moderate reduction on left.  -patient s/p endovascular revascularization and 2nd right toe amputation by vascular surgery. -will follow post operative rec's and patient will follow up in 2 weeks with vascular surgery.    2-intermittent chest discomfort in patient with history of coronary artery disease: Patient had a history of known coronary artery disease (multiple vessels) found not a candidate for CABG. -He has been experiencing stable angina for years; which are essentially unchanged. -Cardiology has seen patient and recommended no further ischemic workup at this moment. -neg troponin X 3; no CP or SOB at rest -Continue conservative management with the use of aspirin, statins, beta blocker and imdur.  3-history of COPD -No wheezing and with good air movement bilaterally  -Will continue as needed albuterol  -tobacco cessation counseling ongoing  4-chronic idiopathic thrombocytopenia -No signs of acute bleeding -continue monitoring platelets count -maybe worse with recent burst of infections   5-tobacco abuse -Cessation counseling encouraged -Nicotine patch has been ordered  6-hypertension -BP remained stable and patient reported no further orthostatic symptoms -following cardiology recs/instructions will continue coreg, lisinopril, imdur  7-hyperlipidemia -will continue lipitor 80 mg daily -liver function has remained stable   8-stage 2 sacral pressure injury -continue preventive measures -overlay mattress used during hospitalization  9-mild hyponatremia:  -sodium remains stable at 134 -eating and drinking ok -will recommend  BMET at follow up to monitor  electrolytes  10-constipation -will use Miralax, senokot and if needed PRN dulcolax.  11-PAD -continue statins, stop smoking and started on plavix.   Procedures:  See below for x-ray reports   2-D echo - Procedure narrative: Transthoracic echocardiography. Image quality was suboptimal. The study was technically difficult, as aresult of poor acoustic windows, poor sound wave transmission,and body habitus. - Left ventricle: The cavity size was normal. Systolic function wasnormal. The estimated ejection fraction was 55%. Dopplerparameters are consistent with abnormal left ventricularrelaxation (grade 1 diastolic dysfunction). - Regional wall motion abnormality: Hypokinesis of the basalinferior myocardium. - Aortic valve: Mildly thickened, mildly calcified leaflets. There was mild regurgitation. - Mitral valve: Mildly thickened leaflets . There was mild tomoderate regurgitation. - Right ventricle: Systolic function was mildly reduced. - Tricuspid valve: There was mild regurgitation.   Right lower extremity ABI  S/P anterior approach endovascular revascularization and right second toe amputation. 9/20   Consultations:  Cardiology   Vascular surgery   Discharge Exam: Vitals:   07/30/17 0624 07/30/17 1240  BP: 127/72 (!) 110/57  Pulse: 87 84  Resp: 16 16  Temp: 98.5 F (36.9 C) 98.7 F (37.1 C)  SpO2: 96% 98%    General:  Afebrile, no CP, no SOB. Reports pain in his right foot, but improved. Reported constipation.  Cardiovascular: S1 and S2, no rubs, no gallops, no JVD.  Respiratory: normal resp effort, no wheezing, no crackles    Abdomen: soft, NT, positive BS, no masses  Musculoskeletal: no edema, no cyanosis, RLE with weak pulses on exam, feeling warm and with better color now. Also with clean dressing on right foot after having 2 toe amputated..   Skin: stage 2 sacral pressure injury, no superimposed infection appreciated.    Discharge  Instructions   Discharge Instructions    Diet - low sodium heart healthy    Complete by:  As directed    Discharge instructions    Complete by:  As directed    Follow heart healthy diet  Take medications as prescribed  Follow up with vascular surgeon in 2 weeks  Maintain adequate hydration     Current Discharge Medication List    START taking these medications   Details  acetaminophen (TYLENOL) 325 MG tablet Take 2 tablets (650 mg total) by mouth every 4 (four) hours as needed for headache or mild pain. Qty: 40 tablet, Refills: 0    bisacodyl (DULCOLAX) 10 MG suppository Place 1 suppository (10 mg total) rectally daily as needed for severe constipation. Qty: 20 suppository, Refills: 0    clopidogrel (PLAVIX) 75 MG tablet Take 1 tablet (75 mg total) by mouth daily with breakfast. Qty: 30 tablet, Refills: 2    doxycycline (VIBRA-TABS) 100 MG tablet Take 1 tablet (100 mg total) by mouth every 12 (twelve) hours. Qty: 10 tablet, Refills: 0    feeding supplement, ENSURE ENLIVE, (ENSURE ENLIVE) LIQD Take 237 mLs by mouth 2 (two) times daily between meals. Qty: 5000 mL, Refills: 12    nicotine (NICODERM CQ - DOSED IN MG/24 HOURS) 14 mg/24hr patch Place 1 patch (14 mg total) onto the skin daily. Qty: 28 patch, Refills: 0    oxyCODONE (ROXICODONE) 5 MG immediate release tablet Take 1 tablet (5 mg total) by mouth every 8 (eight) hours as needed for severe pain. Qty: 15 tablet, Refills: 0    polyethylene glycol (MIRALAX / GLYCOLAX) packet Take 17 g by mouth 2 (two) times daily. Qty: 28 each, Refills: 1      CONTINUE these medications  which have CHANGED   Details  aspirin 81 MG chewable tablet Chew 1 tablet (81 mg total) by mouth daily. Qty: 30 tablet, Refills: 1    isosorbide mononitrate (IMDUR) 30 MG 24 hr tablet Take 1 tablet (30 mg total) by mouth daily. Qty: 30 tablet, Refills: 1      CONTINUE these medications which have NOT CHANGED   Details  atorvastatin (LIPITOR) 80  MG tablet Take 1 tablet (80 mg total) by mouth daily at 6 PM. Qty: 30 tablet, Refills: 0    carvedilol (COREG) 6.25 MG tablet Take 1 tablet (6.25 mg total) by mouth 2 (two) times daily with a meal. Qty: 60 tablet, Refills: 0    lisinopril (PRINIVIL,ZESTRIL) 2.5 MG tablet Take 1 tablet (2.5 mg total) by mouth daily. Qty: 30 tablet, Refills: 0    nitroGLYCERIN (NITROSTAT) 0.4 MG SL tablet Place 1 tablet (0.4 mg total) under the tongue every 5 (five) minutes as needed for chest pain. Qty: 30 tablet, Refills: 0      STOP taking these medications     amLODipine-benazepril (LOTREL) 5-20 MG capsule      spironolactone (ALDACTONE) 25 MG tablet      Multiple Vitamin (MULTIVITAMIN WITH MINERALS) TABS tablet        Allergies  Allergen Reactions  . Chantix [Varenicline] Other (See Comments)    Pt states it made him crazy   Follow-up Information    Waynetta Sandy, MD Follow up in 2 week(s).   Specialties:  Vascular Surgery, Cardiology Why:  office will call Contact information: [place] [address] [address] Follow up.   Specialty:  [place] Why:  They will do your home health care at your home Contact information: [place] [address] [phone]        Thressa Sheller, MD. Schedule an appointment as soon as possible for a visit in 10 day(s).   Specialty:  Internal Medicine Contact information: [place], [place] [address] [phone]            The results of significant diagnostics from this hospitalization (including imaging, microbiology, ancillary and laboratory) are listed below for reference.    Significant Diagnostic Studies: Dg Chest 2 View  Result Date: 07/16/2017 CLINICAL DATA:  Dizziness and hypotension. History of hypertension. Former smoker. EXAM: CHEST  2 VIEW COMPARISON:  07/18/2016 FINDINGS: Shallow inspiration. Heart size and  pulmonary vascularity are normal. Slight interstitial pattern to the lung bases likely representing fibrosis. Mild bronchiectasis. No airspace disease or consolidation. No blunting of costophrenic angles. No pneumothorax. Calcification of the aorta. Previous resection or resorption of the distal left clavicle. Old left rib fractures. 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 No evidence of active pulmonary disease. Aortic atherosclerosis. Electronically Signed   By: Lucienne Capers M.D.   On: 07/16/2017 23:41   Dg Foot 2 Views Right  Result Date: 07/16/2017 CLINICAL DATA:  Necrotic second toe for 10 months. Right foot pain. Dizziness and hypotension. History of hypertension. Former smoker. EXAM: RIGHT FOOT - 2 VIEW COMPARISON:  None. FINDINGS: Diffuse bone demineralization. Soft tissue defect at the tip of the right second toe with underlying irregularity of the distal phalangeal tuft likely representing osteomyelitis. No evidence of acute fracture or dislocation. Multiple exostoses demonstrated. Vascular calcifications. Soft tissues are otherwise unremarkable. IMPRESSION: Soft tissue defect at the tip of the right second toe with underlying  bone erosion of the distal phalangeal tuft likely representing osteomyelitis. Diffuse bone demineralization. Multiple bony exostoses. Electronically Signed   By: Lucienne Capers M.D.   On: 07/16/2017 23:40    Microbiology: No results found for this or any previous visit (from the past 240 hour(s)).   Labs: Basic Metabolic Panel:  Recent Labs Lab 07/25/17 0404 07/26/17 0540 07/28/17 0336 07/30/17 0511  NA 133* 134* 134* 135  K 4.0 3.8 3.6 3.8  CL 103 105 105 106  CO2 23 21* 20* 22  GLUCOSE 89 94 97 100*  BUN 16 14 14 13   CREATININE 1.02 0.97 0.86 0.81  CALCIUM 8.7* 9.0 8.6* 8.6*   Liver Function Tests: No results for input(s): AST, ALT, ALKPHOS, BILITOT, PROT, ALBUMIN in the last 168 hours. No results for input(s): LIPASE, AMYLASE in  the last 168 hours. No results for input(s): AMMONIA in the last 168 hours. CBC:  Recent Labs Lab 07/25/17 0404 07/26/17 0540 07/28/17 0336 07/30/17 0511  WBC 10.5 10.9* 12.5* 11.1*  HGB 9.1* 9.6* 9.2* 7.6*  HCT 28.0* 29.4* 27.9* 23.7*  MCV 76.7* 76.6* 77.1* 77.7*  PLT 156 149* 124* 103*   Cardiac Enzymes: No results for input(s): CKTOTAL, CKMB, CKMBINDEX, TROPONINI in the last 168 hours. BNP: BNP (last 3 results)  Recent Labs  07/17/17 0317  BNP 145.4*   CBG:  Recent Labs Lab 07/24/17 0652 07/25/17 0606 07/28/17 0727  GLUCAP 116* 92 94     Signed:  Barton Dubois MD.  [facility] 07/30/2017, 3:35 PM

## 2017-07-30 NOTE — Progress Notes (Signed)
NCM verified address with patient , NCM left ambulance transport form on chart with demographics.  NCM called patient 's home and spoke with Vincent Dunlap who lives with patient , states he will be there when ptar brings patient home today.  NCM called [facility] after hours for transport.

## 2017-07-30 NOTE — Progress Notes (Signed)
OT Cancellation Note  Patient Details Name: Vincent Dunlap MRN: [id number] DOB: 1941-07-12   Cancelled Treatment:    Reason Eval/Treat Not Completed: Patient declined despite max encouragement, no reason specified.  [place] 07/30/2017, 3:59 PM  Hulda Humphrey OTR/L [phone]

## 2017-07-31 DIAGNOSIS — I110 Hypertensive heart disease with heart failure: Secondary | ICD-10-CM | POA: Diagnosis not present

## 2017-07-31 DIAGNOSIS — I2510 Atherosclerotic heart disease of native coronary artery without angina pectoris: Secondary | ICD-10-CM | POA: Diagnosis not present

## 2017-07-31 DIAGNOSIS — Z4781 Encounter for orthopedic aftercare following surgical amputation: Secondary | ICD-10-CM | POA: Diagnosis not present

## 2017-07-31 DIAGNOSIS — J449 Chronic obstructive pulmonary disease, unspecified: Secondary | ICD-10-CM | POA: Diagnosis not present

## 2017-07-31 DIAGNOSIS — G894 Chronic pain syndrome: Secondary | ICD-10-CM | POA: Diagnosis not present

## 2017-07-31 DIAGNOSIS — I252 Old myocardial infarction: Secondary | ICD-10-CM | POA: Diagnosis not present

## 2017-07-31 DIAGNOSIS — N390 Urinary tract infection, site not specified: Secondary | ICD-10-CM | POA: Diagnosis not present

## 2017-07-31 DIAGNOSIS — M86671 Other chronic osteomyelitis, right ankle and foot: Secondary | ICD-10-CM | POA: Diagnosis not present

## 2017-07-31 DIAGNOSIS — E43 Unspecified severe protein-calorie malnutrition: Secondary | ICD-10-CM | POA: Diagnosis not present

## 2017-07-31 DIAGNOSIS — A419 Sepsis, unspecified organism: Secondary | ICD-10-CM | POA: Diagnosis not present

## 2017-07-31 DIAGNOSIS — I5022 Chronic systolic (congestive) heart failure: Secondary | ICD-10-CM | POA: Diagnosis not present

## 2017-07-31 DIAGNOSIS — D693 Immune thrombocytopenic purpura: Secondary | ICD-10-CM | POA: Diagnosis not present

## 2017-07-31 DIAGNOSIS — I739 Peripheral vascular disease, unspecified: Secondary | ICD-10-CM | POA: Diagnosis not present

## 2017-08-01 ENCOUNTER — Other Ambulatory Visit: Payer: Self-pay

## 2017-08-01 ENCOUNTER — Other Ambulatory Visit: Payer: Self-pay | Admitting: Licensed Clinical Social Worker

## 2017-08-01 DIAGNOSIS — I159 Secondary hypertension, unspecified: Secondary | ICD-10-CM

## 2017-08-01 NOTE — Patient Outreach (Signed)
[facility]) Care Management  08/01/2017  Vincent Dunlap October 26, 1941 [id number]  Subjective: none  Objective: none  Assessment: 76 year old with recent admission 9/8-9/22 with osteomyelitis. Amputation of right second toe during hospitalization. History of HTN, heart failure, Myocardial infarction, COPD, UTI, pressure ulcer, hypertension.  RNCM called for transition of care. No answer. HIPPA compliant message left.  Plan: follow up call tomorrow.  [name], RN, MSN, [place] Coordinator Cell: [phone]

## 2017-08-01 NOTE — Patient Outreach (Signed)
[place] [facility]) Care Management  08/01/2017  Vincent Dunlap 1941/02/20 [id number]  Assessment-CSW completed outreach attempt today after receiving new referral for social work assistance. CSW unable to reach patient successfully. CSW left a HIPPA compliant voice message encouraging patient to return call once available.  Plan-CSW will await return call or complete an additional outreach if needed.  [name], BSW, MSW, [email or web address] Phone: [phone] Fax: [phone]

## 2017-08-01 NOTE — Consult Note (Signed)
Spoke with Sonia Baller at [facility] regarding [place] Management will be following for transportation, community resources, and post hospital care management needs for disease management. For questions, please contact:  Natividad Brood, RN BSN [facility] Liaison  [phone] business mobile phone Toll free office [phone]

## 2017-08-02 ENCOUNTER — Other Ambulatory Visit: Payer: Self-pay | Admitting: Licensed Clinical Social Worker

## 2017-08-02 ENCOUNTER — Other Ambulatory Visit: Payer: Self-pay

## 2017-08-02 DIAGNOSIS — N390 Urinary tract infection, site not specified: Secondary | ICD-10-CM | POA: Diagnosis not present

## 2017-08-02 DIAGNOSIS — I252 Old myocardial infarction: Secondary | ICD-10-CM | POA: Diagnosis not present

## 2017-08-02 DIAGNOSIS — Z4781 Encounter for orthopedic aftercare following surgical amputation: Secondary | ICD-10-CM | POA: Diagnosis not present

## 2017-08-02 DIAGNOSIS — I110 Hypertensive heart disease with heart failure: Secondary | ICD-10-CM | POA: Diagnosis not present

## 2017-08-02 DIAGNOSIS — G894 Chronic pain syndrome: Secondary | ICD-10-CM | POA: Diagnosis not present

## 2017-08-02 DIAGNOSIS — E43 Unspecified severe protein-calorie malnutrition: Secondary | ICD-10-CM | POA: Diagnosis not present

## 2017-08-02 DIAGNOSIS — I5022 Chronic systolic (congestive) heart failure: Secondary | ICD-10-CM | POA: Diagnosis not present

## 2017-08-02 DIAGNOSIS — J449 Chronic obstructive pulmonary disease, unspecified: Secondary | ICD-10-CM | POA: Diagnosis not present

## 2017-08-02 DIAGNOSIS — A419 Sepsis, unspecified organism: Secondary | ICD-10-CM | POA: Diagnosis not present

## 2017-08-02 DIAGNOSIS — I2510 Atherosclerotic heart disease of native coronary artery without angina pectoris: Secondary | ICD-10-CM | POA: Diagnosis not present

## 2017-08-02 DIAGNOSIS — I739 Peripheral vascular disease, unspecified: Secondary | ICD-10-CM | POA: Diagnosis not present

## 2017-08-02 DIAGNOSIS — M86671 Other chronic osteomyelitis, right ankle and foot: Secondary | ICD-10-CM | POA: Diagnosis not present

## 2017-08-02 DIAGNOSIS — D693 Immune thrombocytopenic purpura: Secondary | ICD-10-CM | POA: Diagnosis not present

## 2017-08-02 NOTE — Patient Outreach (Signed)
[facility]) Care Management  08/02/2017  Vincent Dunlap May 30, 1941 [id number]  Assessment-CSW completed second outreach attempt today. CSW unable to reach patient successfully. CSW left a HIPPA compliant voice message encouraging patient to return call once available.  Plan-CSW will await return call or complete an additional outreach if needed.  [name], BSW, MSW, [email or web address] Phone: [phone] Fax: [phone]

## 2017-08-02 NOTE — Patient Outreach (Signed)
[place] [facility]) Care Management  08/02/2017  Vincent Dunlap 22-Nov-1940 [id number]   Subjective: "I need some help".  Objective: none  Assessment: 76 year old with recent admission for osteomyelitis right second toe/amputation Right second toe. Client reports home health nurse has been out to dress his wound and will be out again this week.  Client reports he has a "buddy" who is staying with him for now. Client reports he does not have his medications. He states he has the prescriptions, but does not have the money or transportation to get to the pharmacy to pick up the medications.   Client reports he has been approved for 2.5 hours/day 7days/week, but that no one has been out. He states he does not read or write, so he is not sure how to get someone out to his house.  Client to call his primary care to schedule follow up appointment then call RNCM to inform. His friend Mr. Barnet Pall wrote down RNCM's contact information.  Plan: [place] referral for medication assistance/management; social work referral for transportation/Personal care service assistance/social issues. Home visit scheduled for next week.   [name], RN, MSN, [place] Coordinator Cell: [phone]

## 2017-08-03 ENCOUNTER — Other Ambulatory Visit: Payer: Self-pay

## 2017-08-03 ENCOUNTER — Other Ambulatory Visit: Payer: Self-pay | Admitting: Licensed Clinical Social Worker

## 2017-08-03 ENCOUNTER — Encounter (HOSPITAL_COMMUNITY): Payer: Self-pay | Admitting: Vascular Surgery

## 2017-08-03 DIAGNOSIS — I5022 Chronic systolic (congestive) heart failure: Secondary | ICD-10-CM | POA: Diagnosis not present

## 2017-08-03 DIAGNOSIS — M86671 Other chronic osteomyelitis, right ankle and foot: Secondary | ICD-10-CM | POA: Diagnosis not present

## 2017-08-03 DIAGNOSIS — I2510 Atherosclerotic heart disease of native coronary artery without angina pectoris: Secondary | ICD-10-CM | POA: Diagnosis not present

## 2017-08-03 DIAGNOSIS — I110 Hypertensive heart disease with heart failure: Secondary | ICD-10-CM | POA: Diagnosis not present

## 2017-08-03 DIAGNOSIS — I739 Peripheral vascular disease, unspecified: Secondary | ICD-10-CM | POA: Diagnosis not present

## 2017-08-03 DIAGNOSIS — N390 Urinary tract infection, site not specified: Secondary | ICD-10-CM | POA: Diagnosis not present

## 2017-08-03 DIAGNOSIS — I252 Old myocardial infarction: Secondary | ICD-10-CM | POA: Diagnosis not present

## 2017-08-03 DIAGNOSIS — G894 Chronic pain syndrome: Secondary | ICD-10-CM | POA: Diagnosis not present

## 2017-08-03 DIAGNOSIS — A419 Sepsis, unspecified organism: Secondary | ICD-10-CM | POA: Diagnosis not present

## 2017-08-03 DIAGNOSIS — Z4781 Encounter for orthopedic aftercare following surgical amputation: Secondary | ICD-10-CM | POA: Diagnosis not present

## 2017-08-03 DIAGNOSIS — J449 Chronic obstructive pulmonary disease, unspecified: Secondary | ICD-10-CM | POA: Diagnosis not present

## 2017-08-03 DIAGNOSIS — D693 Immune thrombocytopenic purpura: Secondary | ICD-10-CM | POA: Diagnosis not present

## 2017-08-03 DIAGNOSIS — E43 Unspecified severe protein-calorie malnutrition: Secondary | ICD-10-CM | POA: Diagnosis not present

## 2017-08-03 NOTE — Patient Outreach (Signed)
[name]) Care Management  08/03/2017  Vincent Dunlap [birth year] [id number]  Assessment- CSW completed call to [facility] and was informed that patient choose a new agency during his last assessment but they declined him. They are currently waiting for him to contact them to choose another agency. CSW was informed that if patient wants to choose the agency that he has had in the past then he still would need to contact them and let them know.  CSW completed third outreach call to patient. CSW was unable to reach him. CSW left a HIPPA compliant voice message and included this information as well as [facility] number for him to contact [phone].)  Plan-CSW will make a fourth and final outreach call within two weeks.  [name], BSW, MSW, [email or web address] Phone: [phone] Fax: [phone]

## 2017-08-03 NOTE — Patient Outreach (Signed)
[facility]) Care Management  08/03/2017  Vincent Dunlap [birth year] [id number]   Subjective: none  Objective: none  Assessment: 76 year old with recent admission for osteomyelitis right second toe/amputation Right second toe. History of Heart failure, HTN, Myocardial infarction, UTI, COPD, sepsis.  RNCM received notification today regarding EMMI red flag on 08/02/17 re: scheduled a follow up appointment and filled new prescriptions. RNCM spoke with client on yesterday and have addressed these issues. Therefore RNCM will not call today.  Plan: continue to follow.  [name], RN, MSN, [place] Coordinator Cell: [phone]

## 2017-08-04 ENCOUNTER — Other Ambulatory Visit: Payer: Self-pay | Admitting: *Deleted

## 2017-08-04 DIAGNOSIS — G894 Chronic pain syndrome: Secondary | ICD-10-CM | POA: Diagnosis not present

## 2017-08-04 DIAGNOSIS — I110 Hypertensive heart disease with heart failure: Secondary | ICD-10-CM | POA: Diagnosis not present

## 2017-08-04 DIAGNOSIS — I252 Old myocardial infarction: Secondary | ICD-10-CM | POA: Diagnosis not present

## 2017-08-04 DIAGNOSIS — N390 Urinary tract infection, site not specified: Secondary | ICD-10-CM | POA: Diagnosis not present

## 2017-08-04 DIAGNOSIS — I5022 Chronic systolic (congestive) heart failure: Secondary | ICD-10-CM | POA: Diagnosis not present

## 2017-08-04 DIAGNOSIS — E43 Unspecified severe protein-calorie malnutrition: Secondary | ICD-10-CM | POA: Diagnosis not present

## 2017-08-04 DIAGNOSIS — I2510 Atherosclerotic heart disease of native coronary artery without angina pectoris: Secondary | ICD-10-CM | POA: Diagnosis not present

## 2017-08-04 DIAGNOSIS — A419 Sepsis, unspecified organism: Secondary | ICD-10-CM | POA: Diagnosis not present

## 2017-08-04 DIAGNOSIS — D693 Immune thrombocytopenic purpura: Secondary | ICD-10-CM | POA: Diagnosis not present

## 2017-08-04 DIAGNOSIS — J449 Chronic obstructive pulmonary disease, unspecified: Secondary | ICD-10-CM | POA: Diagnosis not present

## 2017-08-04 DIAGNOSIS — Z4781 Encounter for orthopedic aftercare following surgical amputation: Secondary | ICD-10-CM | POA: Diagnosis not present

## 2017-08-04 DIAGNOSIS — I739 Peripheral vascular disease, unspecified: Secondary | ICD-10-CM | POA: Diagnosis not present

## 2017-08-04 DIAGNOSIS — M86671 Other chronic osteomyelitis, right ankle and foot: Secondary | ICD-10-CM | POA: Diagnosis not present

## 2017-08-04 NOTE — Patient Outreach (Signed)
[facility]) Care Management  08/04/2017  Vincent Dunlap 1941/09/15 [id number]   Covering for assigned care manager, J. Juleen China.  Call placed to primary MD office to inquire about follow up appointment in attempt to help secure transportation.  Spoke with Elmyra Ricks, member has not scheduled appointment as of yet.  Will update assigned care manager upon return to follow up with member next week.  Valente David, [place], MSN [place] [phone]

## 2017-08-05 ENCOUNTER — Other Ambulatory Visit: Payer: Self-pay | Admitting: *Deleted

## 2017-08-05 ENCOUNTER — Other Ambulatory Visit: Payer: Self-pay

## 2017-08-05 ENCOUNTER — Other Ambulatory Visit: Payer: Self-pay | Admitting: Pharmacist

## 2017-08-05 DIAGNOSIS — I739 Peripheral vascular disease, unspecified: Secondary | ICD-10-CM | POA: Diagnosis not present

## 2017-08-05 DIAGNOSIS — I2510 Atherosclerotic heart disease of native coronary artery without angina pectoris: Secondary | ICD-10-CM | POA: Diagnosis not present

## 2017-08-05 DIAGNOSIS — J449 Chronic obstructive pulmonary disease, unspecified: Secondary | ICD-10-CM | POA: Diagnosis not present

## 2017-08-05 DIAGNOSIS — A419 Sepsis, unspecified organism: Secondary | ICD-10-CM | POA: Diagnosis not present

## 2017-08-05 DIAGNOSIS — I110 Hypertensive heart disease with heart failure: Secondary | ICD-10-CM | POA: Diagnosis not present

## 2017-08-05 DIAGNOSIS — D693 Immune thrombocytopenic purpura: Secondary | ICD-10-CM | POA: Diagnosis not present

## 2017-08-05 DIAGNOSIS — M86671 Other chronic osteomyelitis, right ankle and foot: Secondary | ICD-10-CM | POA: Diagnosis not present

## 2017-08-05 DIAGNOSIS — G894 Chronic pain syndrome: Secondary | ICD-10-CM | POA: Diagnosis not present

## 2017-08-05 DIAGNOSIS — I252 Old myocardial infarction: Secondary | ICD-10-CM | POA: Diagnosis not present

## 2017-08-05 DIAGNOSIS — N390 Urinary tract infection, site not specified: Secondary | ICD-10-CM | POA: Diagnosis not present

## 2017-08-05 DIAGNOSIS — I5022 Chronic systolic (congestive) heart failure: Secondary | ICD-10-CM | POA: Diagnosis not present

## 2017-08-05 DIAGNOSIS — E43 Unspecified severe protein-calorie malnutrition: Secondary | ICD-10-CM | POA: Diagnosis not present

## 2017-08-05 DIAGNOSIS — Z4781 Encounter for orthopedic aftercare following surgical amputation: Secondary | ICD-10-CM | POA: Diagnosis not present

## 2017-08-05 NOTE — Consult Note (Signed)
This Probation officer received a call from [place] regarding inability to make contact with the patient.  Patient's has verbally given his friend Felicity Pellegrini  as a contact.   Felicity Pellegrini in the interim contacted this Probation officer, patient gave HIPAA, and verified contact, that  from [phone] and states that the social worker came today and went to get this patient's medication, specifically "his antibiotics, but he still needs his other medicines".  Will follow up with [place].  Shared this contact information with Lennette Bihari [facility] Pharmacist at this time.  Natividad Brood, RN BSN [facility] Liaison  [phone] business mobile phone Toll free office [phone]

## 2017-08-05 NOTE — Patient Outreach (Signed)
[facility]) Care Management  08/05/2017  Vincent Dunlap [birth year] [id number]  Received a referral regarding patient assistance for patient's medications, specifically antibiotics.  Attempted to contact patient at number listed in chart as well as emergency contact, for Vincent Dunlap, which was provided by [facility] RN Vincent Dunlap.   There was no answer at either phone number by patient.    Placed call to [facility], who also did not have additional contact information.   Received a call back later from [facility] Liaison that patient's friend contacted her with a different phone number and indicated patient was getting his antibiotic.    Plan:  Given inability to reach patient today, will make outreach attempt next week.   Karrie Meres, PharmD, [place] [phone]

## 2017-08-05 NOTE — Patient Outreach (Signed)
[facility]) Care Management  08/05/2017  Vincent Dunlap 05-13-1941 [id number]   Notified by hospital liaison that member's friend/caregiver Felicity Pellegrini) was inquiring about contact/visit with member.  Per member's schedule, assigned care manager, J. Juleen China, is scheduled to visit member next Tuesday.  Call placed to Mr. Barnet Pall to provide information, no answer.  HIPAA compliant voice message left.  Will await call back.  Valente David, [place], MSN [place] [phone]

## 2017-08-08 ENCOUNTER — Other Ambulatory Visit: Payer: Self-pay

## 2017-08-08 ENCOUNTER — Other Ambulatory Visit: Payer: Self-pay | Admitting: Licensed Clinical Social Worker

## 2017-08-08 ENCOUNTER — Other Ambulatory Visit: Payer: Self-pay | Admitting: Pharmacist

## 2017-08-08 DIAGNOSIS — I2510 Atherosclerotic heart disease of native coronary artery without angina pectoris: Secondary | ICD-10-CM | POA: Diagnosis not present

## 2017-08-08 DIAGNOSIS — Z4781 Encounter for orthopedic aftercare following surgical amputation: Secondary | ICD-10-CM | POA: Diagnosis not present

## 2017-08-08 DIAGNOSIS — E43 Unspecified severe protein-calorie malnutrition: Secondary | ICD-10-CM | POA: Diagnosis not present

## 2017-08-08 DIAGNOSIS — D693 Immune thrombocytopenic purpura: Secondary | ICD-10-CM | POA: Diagnosis not present

## 2017-08-08 DIAGNOSIS — I5022 Chronic systolic (congestive) heart failure: Secondary | ICD-10-CM | POA: Diagnosis not present

## 2017-08-08 DIAGNOSIS — I252 Old myocardial infarction: Secondary | ICD-10-CM | POA: Diagnosis not present

## 2017-08-08 DIAGNOSIS — N390 Urinary tract infection, site not specified: Secondary | ICD-10-CM | POA: Diagnosis not present

## 2017-08-08 DIAGNOSIS — M86671 Other chronic osteomyelitis, right ankle and foot: Secondary | ICD-10-CM | POA: Diagnosis not present

## 2017-08-08 DIAGNOSIS — I110 Hypertensive heart disease with heart failure: Secondary | ICD-10-CM | POA: Diagnosis not present

## 2017-08-08 DIAGNOSIS — I739 Peripheral vascular disease, unspecified: Secondary | ICD-10-CM | POA: Diagnosis not present

## 2017-08-08 DIAGNOSIS — A419 Sepsis, unspecified organism: Secondary | ICD-10-CM | POA: Diagnosis not present

## 2017-08-08 DIAGNOSIS — G894 Chronic pain syndrome: Secondary | ICD-10-CM | POA: Diagnosis not present

## 2017-08-08 DIAGNOSIS — J449 Chronic obstructive pulmonary disease, unspecified: Secondary | ICD-10-CM | POA: Diagnosis not present

## 2017-08-08 NOTE — Patient Outreach (Signed)
Request received from [name], LCSW  To refer patient patient to Mobile Meals Delivery.  Referral made on 08/08/17.  Meals will begin 08/10/17.  Programmer, multimedia will notify patient.

## 2017-08-08 NOTE — Patient Outreach (Signed)
[facility]) Care Management  08/08/2017  DAVIUS GOUDEAU 1940-12-12 [id number]  RNCM called to follow up. No answer-unable to leave message. RNCM called emergency contact Felicity Pellegrini. No answer. HIPPA compliant message left.  Plan: home visit previously scheduled for tomorrow.  [name], RN, MSN, [place] Coordinator Cell: [phone]

## 2017-08-08 NOTE — Patient Outreach (Signed)
[facility]) Care Management  08/08/2017  Vincent Dunlap May 13, 1941 [id number]  Assessment- CSW received update from [place] stating that patient had contacted her and stated that he still needs assistance with starting PCS with [facility]. CSW was also informed to contact patient's neighbor Octavia Bruckner at [phone] as patient's phone is not currently working. CSW also received an incoming call from Catalina Lunger, Education officer, museum at [facility]. She reports that she wishes to coordinate services with [facility] CSW. She shared that she went to patient's residence today and he had no food in his refrigerator and that she went and bought him groceries which included: soup, milk, [place], water, macaroni and cheese and bread. CSW informed Catalina Lunger that she will arrange for patient to gain a 30 day supply of Mobile Meals paid for through [facility]. CSW reports that [facility] is currently involved and that patient is receiving wound case, social work, PT and OT. CSW reports that patient has transportation issues as well and she is not sure how patient got to his medical appointments before our involvement. CSW is waiting for another order from patient's PCP so that she can come out and assist patient again this week. CSW and Catalina Lunger will coordinate care and services to ensure that patient gains food assistance, stable transportation and PCS through [facility]. CSW informed Catalina Lunger that patient has been approved for [facility] but that they are waiting for him to contact them in order to choose an agency and start services. CSW Catalina Lunger reports that she provided patient with Medicaid transportation number as well until SCAT or Senior Wheels can be gained successfully. CSW Catalina Lunger reports that patient will get paid on 08/10/17 and that he also receives $75.00 in food stamps per month.   CSW sent in basket message and request to [place] Management Assistant requesting that  a 30 day supply of Mobile Meals be arranged for patient. [place] Management Assistant will complete this request.  CSW completed call to patient's neighbor Octavia Bruckner but was not successful in reaching patient or neighbor. CSW left an additional HIPPA compliant voice message encouraging a return call. CSW has completed four unsuccessful outreach attempts.  Plan-CSW will await for return call from patient at this time and route encounter to involved [place] Management team.   [name], [place], MSW, [email or web address] Phone: [phone] Fax: [phone]

## 2017-08-08 NOTE — Patient Outreach (Signed)
[facility]) Care Management  08/08/2017  Vincent Dunlap 02-18-1941 [id number]  Patient was referred to [place] Pharmacist for medication patient assistance.  Successful phone outreach to patient, by calling phone number for his friend Octavia Bruckner, which was in note from 08/05/17 [facility] Liaison.   Patient verified HIPAA details and agreed to call.  He reports he has Medicare/Medicaid and [employer].  Dicussed with patient that as he has Medicare/Medicaid he may be able to obtain 90 day supplies of medications for potentially no co--pay from mail order pharmacy.  He was not interested in this.  Discussed that if he obtains 90 day supplies of his medications he should be able to obtain them for the cost of a 30 day supply, still providing some costs savings.    He reports he uses [facility] at this time and prefers to stay there.    When patient was asked if he had his medications, he replied that he didn't want to get them out as many people have been asking about his medications.    If patient has the [facility] Dual Complete Plan, he may also be eligible for an over-the-counter benefit each quarter.    Patient thanked [facility] Pharmacist for the call, but stated he wasn't interested in speaking further with [facility] Pharmacist at this time.   Plan:  Updated both THN RN and THN LCSW of patient's refusal for [facility] Pharmacist.   Will close pharmacy episode at this time.   If patient is interested in [place] later, patient can be re-referred to [place].   Karrie Meres, PharmD, [place] [phone]

## 2017-08-09 ENCOUNTER — Other Ambulatory Visit: Payer: Self-pay | Admitting: Licensed Clinical Social Worker

## 2017-08-09 ENCOUNTER — Other Ambulatory Visit: Payer: Self-pay

## 2017-08-09 NOTE — Patient Outreach (Addendum)
[facility]) Care Management   08/09/2017  Vincent Dunlap 1941-02-13 [id number]  Vincent Dunlap is an 76 y.o. male  Subjective: "I will be alright when I get straightended out."  Objective:  BP (!) 100/50   Pulse 80   Resp 20   Ht 1.803 m (5' 11" )   Wt 157 lb (71.2 kg)   SpO2 94%   BMI 21.90 kg/m   Review of Systems  Respiratory:       Decreased breath sounds, faint end expiratory wheeze noted bilaterally.  Cardiovascular:       Heart sounds regular.  Skin:       Dressing to right foot/second toe intact.    Physical Exam  Encounter Medications:   Outpatient Encounter Prescriptions as of 08/09/2017  Medication Sig  . acetaminophen (TYLENOL) 325 MG tablet Take 2 tablets (650 mg total) by mouth every 4 (four) hours as needed for headache or mild pain.  Marland Kitchen aspirin 81 MG chewable tablet Chew 1 tablet (81 mg total) by mouth daily.  Marland Kitchen atorvastatin (LIPITOR) 80 MG tablet Take 1 tablet (80 mg total) by mouth daily at 6 PM.  . bisacodyl (DULCOLAX) 10 MG suppository Place 1 suppository (10 mg total) rectally daily as needed for severe constipation.  . carvedilol (COREG) 6.25 MG tablet Take 1 tablet (6.25 mg total) by mouth 2 (two) times daily with a meal.  . clopidogrel (PLAVIX) 75 MG tablet Take 1 tablet (75 mg total) by mouth daily with breakfast.  . doxycycline (VIBRA-TABS) 100 MG tablet Take 1 tablet (100 mg total) by mouth every 12 (twelve) hours.  . feeding supplement, ENSURE ENLIVE, (ENSURE ENLIVE) LIQD Take 237 mLs by mouth 2 (two) times daily between meals.  . isosorbide mononitrate (IMDUR) 30 MG 24 hr tablet Take 1 tablet (30 mg total) by mouth daily.  Marland Kitchen lisinopril (PRINIVIL,ZESTRIL) 2.5 MG tablet Take 1 tablet (2.5 mg total) by mouth daily.  . nicotine (NICODERM CQ - DOSED IN MG/24 HOURS) 14 mg/24hr patch Place 1 patch (14 mg total) onto the skin daily.  . nitroGLYCERIN (NITROSTAT) 0.4 MG SL tablet Place 1 tablet (0.4 mg total) under the tongue every 5 (five)  minutes as needed for chest pain.  Marland Kitchen oxyCODONE (ROXICODONE) 5 MG immediate release tablet Take 1 tablet (5 mg total) by mouth every 8 (eight) hours as needed for severe pain.  . polyethylene glycol (MIRALAX / GLYCOLAX) packet Take 17 g by mouth 2 (two) times daily.   No facility-administered encounter medications on file as of 08/09/2017.     Functional Status:   In your present state of health, do you have any difficulty performing the following activities: 08/09/2017 07/17/2017  Hearing? [place]? N -  Difficulty concentrating or making decisions? N -  Walking or climbing stairs? Y -  Dressing or bathing? Y -  Doing errands, shopping? Vincent Dunlap  Preparing Food and eating ? Y -  Using the Toilet? N -  Comment uses bedside commode. -  In the past six months, have you accidently leaked urine? N -  Do you have problems with loss of bowel control? N -  Managing your Medications? Y -  Managing your Finances? Y -  Housekeeping or managing your Housekeeping? Y -  Some recent data might be hidden    Fall/Depression Screening:    Fall Risk  08/09/2017  Falls in the past year? Yes  Number falls in past yr: 2 or  more  Injury with Fall? Yes  Risk Factor Category  High Fall Risk  Risk for fall due to : Impaired mobility;Impaired balance/gait  Follow up Falls prevention discussed;Falls evaluation completed   PHQ 2/9 Scores 08/09/2017  PHQ - 2 Score 0    Assessment:  76 year old with recent admission for osteomyelitis right second toe/amputation Right second toe. History of Heart failure, HTN, Myocardial infarction, UTI, COPD, sepsis. Home health nursing and physical therapy with [facility].  RNCM completed home visit with Vincent Fried, LCSW. Upon arrival client was sitting up in chair/legs elevated, talking on phone to Vincent Dunlap with the help of Education officer, museum.  Client reports that he does have pain. However he has not received his medication from the pharmacy yet.    Medications reviewed: Client reports his friend is on the way to pick up his medications now which includes his pain medication.   Appointment adherence: RNCM called to schedule/confirm follow up appointments. Vincent Sizer, LCSW arranged transportation. See social work note.  Primary care-will see Vincent Dunlap October 9 at 3:15 Vascular and Vein Specialist-Dr. October 12 arrive 2:45 pm for ultrasound [facility] and Vein Specialist October 19 arrive at 09:45 am   RNCM provided [facility] calender/organizer and explained how to use. RNCM contact number provided.  Plan: follow up next week. Vincent Dunlap Care Plan Problem One     Most Recent Value  Care Plan Problem One  at risk for readmission recent admission for osteomylitis.  Role Documenting the Problem One  Care Management [place] for Problem One  Active  [facility] Long Term Goal   client will not be readmitted within the next 31 days.  Vincent Long Term Goal Start Date  08/02/17  Interventions for Problem One Long Term Goal  care coordination with provider office for appointment, co-visit with Vincent Dunlap social worker, provided Vincent Dunlap calender/orgarnizer. appointment date time contact information for follow up appointment placed on calender.  Vincent Dunlap Short Term Goal #1   client will call to schedule follow up appointment within the week.  Vincent Dunlap Short Term Goal #1 Start Date  08/02/17  Interventions for Short Term Goal #1  RNCM called and scheduled primary care appointment-date scheduled per members request.. RNCM discussed importance of PCP follow up.  Vincent Dunlap Short Term Goal #2   client will verbalize contact with referred disciplines within the next 1-2 weeks.  Vincent Dunlap Short Term Goal #2 Start Date  08/02/17  [facility] Dunlap Short Term Goal #2 Met Date  08/09/17      [name], RN, MSN, [place] Coordinator Cell: [phone]

## 2017-08-09 NOTE — Patient Outreach (Addendum)
[facility]) Care Management  [facility] Social Work  08/09/2017  Vincent Dunlap 07-19-1941 [id number]  Encounter Medications:  Outpatient Encounter Prescriptions as of 08/09/2017  Medication Sig  . acetaminophen (TYLENOL) 325 MG tablet Take 2 tablets (650 mg total) by mouth every 4 (four) hours as needed for headache or mild pain.  Marland Kitchen aspirin 81 MG chewable tablet Chew 1 tablet (81 mg total) by mouth daily.  Marland Kitchen atorvastatin (LIPITOR) 80 MG tablet Take 1 tablet (80 mg total) by mouth daily at 6 PM.  . bisacodyl (DULCOLAX) 10 MG suppository Place 1 suppository (10 mg total) rectally daily as needed for severe constipation.  . carvedilol (COREG) 6.25 MG tablet Take 1 tablet (6.25 mg total) by mouth 2 (two) times daily with a meal.  . clopidogrel (PLAVIX) 75 MG tablet Take 1 tablet (75 mg total) by mouth daily with breakfast.  . doxycycline (VIBRA-TABS) 100 MG tablet Take 1 tablet (100 mg total) by mouth every 12 (twelve) hours.  . feeding supplement, ENSURE ENLIVE, (ENSURE ENLIVE) LIQD Take 237 mLs by mouth 2 (two) times daily between Dunlap.  . isosorbide mononitrate (IMDUR) 30 MG 24 hr tablet Take 1 tablet (30 mg total) by mouth daily.  Marland Kitchen lisinopril (PRINIVIL,ZESTRIL) 2.5 MG tablet Take 1 tablet (2.5 mg total) by mouth daily.  . nicotine (NICODERM CQ - DOSED IN MG/24 HOURS) 14 mg/24hr patch Place 1 patch (14 mg total) onto the skin daily.  . nitroGLYCERIN (NITROSTAT) 0.4 MG SL tablet Place 1 tablet (0.4 mg total) under the tongue every 5 (five) minutes as needed for chest pain.  Marland Kitchen oxyCODONE (ROXICODONE) 5 MG immediate release tablet Take 1 tablet (5 mg total) by mouth every 8 (eight) hours as needed for severe pain.  . polyethylene glycol (MIRALAX / GLYCOLAX) packet Take 17 g by mouth 2 (two) times daily.   No facility-administered encounter medications on file as of 08/09/2017.     Functional Status:  In your present state of health, do you have any difficulty performing the  following activities: 08/09/2017 07/17/2017  Hearing? Vincent Dunlap? N -  Difficulty concentrating or making decisions? N -  Walking or climbing stairs? Y -  Dressing or bathing? Y -  Doing errands, shopping? Vincent Dunlap  Preparing Food and eating ? Y -  Using the Toilet? N -  Comment uses bedside commode. -  In the past six months, have you accidently leaked urine? N -  Do you have problems with loss of bowel control? N -  Managing your Medications? Y -  Managing your Finances? Y -  Housekeeping or managing your Housekeeping? Y -  Some recent data might be hidden    Fall/Depression Screening:  PHQ 2/9 Scores 08/09/2017  PHQ - 2 Score 0    Assessment: Vincent Dunlap completed joint home visit with Vincent Dunlap RNCM Vincent Dunlap at patient's residence on 08/09/17. Patient is in need of transportation assistance, food assistance, assistance with gaining PCS and financial assistance. Patient reports that he receives $92 in food stamps per month (not $75.) Patient's Education officer, museum with Vincent Dunlap delivered food to him this week and Vincent Dunlap Vincent Dunlap arranged for patient to gain Vincent Dunlap for 30 days starting 08/10/17. Vincent Dunlap also placed patient on the inquiry wait list for the Vincent Dunlap. Vincent Dunlap provided patient with a list of food pantries available in Vincent Dunlap. Patient has already been approved for PCS through Vincent Dunlap but patient needs to call  them and pick an agency. Vincent Dunlap assisted patient with this today. Vincent Dunlap provided patient with a 10 page list of available PCS agencies that he has to choose from. Patient prefers to use the same agency he had last year which is Vincent Dunlap. Vincent Dunlap completed call to Vincent Dunlap and had patient speak to representative. Patient successfully chose PCS agency and order was successfully placed. Vincent Dunlap will monitor this process to ensure that patient gains services. Vincent Dunlap provided list of senior resources for patient and reviewed them with him which included Dunlap  such as: [employer], [employer], [employer], [employer] Patient has transportation issues as Dunlap. Patient has the dual complete plan with Vincent Dunlap which should cover transportation. Vincent Dunlap completed call to Vincent Dunlap insurance and spoke to a representative. It was confirmed that patient's insurance benefits include: 24 one way transportation rides for medical appointments per year. Patient also can receive $200 per quarter for OTC's. Vincent Dunlap RNCM scheduled follow up appointments for patient. Vincent Dunlap completed call to [phone] and successfully arranged transportation for the following appointments: Oct 9th at 3:15pm with Vincent Dunlap and October 12 at 2:45 with Vincent Dunlap with Vincent Dunlap. Vincent Dunlap was not able to schedule transportation arrangements for his appointment on 08/26/17 at Vincent Dunlap at 9:45 am because Vincent Dunlap transportation can only schedule rides within 14 days of the appointment. RNCM wrote down all upcoming appointments in Vincent Dunlap. Vincent Dunlap included the Vincent Dunlap transportation pick up times from home and from physician office for scheduled appointments as Dunlap. Vincent Dunlap wrote down: [facility] number, SCAT reservation line and UHC transportation line for patient to keep. Patient is also interested in gaining SCAT services. Vincent Dunlap provided education on the eligibility and application process. Vincent Dunlap successfully complete patient's SCAT application and will fax it to the [place] office on 08/10/17. Patient declined SNF placement and does not want LTC placement as Dunlap. However, patient needs more support in the home if patient plans to stay at home.   [facility] CM Care Plan Problem One     Most Recent Value  Care Plan Problem One  Lack of community resource support and services  Role Documenting the Problem One  Clinical Social Worker  Care Plan for Problem One  Active  THN Long Term Goal   Patient will gain new PCS agency within 90 days  THN Long Term  Goal Start Date  08/09/17  Interventions for Problem One Long Term Goal  Patient has been approved for [employer] [facility] but needed to contact [facility] to choose an agency. Vincent Dunlap provided [facility] Dunlap agency list and assisted patient with completing call to [place] [facility]. PCS agency was chosen. Vincent Dunlap will monitor entire process to ensure that services are successfully gained.  THN CM Short Term Goal #1   Patient will gain stable transportation through SCAT within 30 days  THN CM Short Term Goal #1 Start Date  08/09/17  Interventions for Short Term Goal #1  Vincent Dunlap completed home visit and provided transportation resource information. Vincent Dunlap will complete SCAT application, fax to [place] office and will monitor entire process to ensure that stable transportation is successfully gained.     Plan: Vincent Dunlap will route encounter to PCP. Vincent Dunlap will follow up within one-two weeks. Vincent Dunlap will fax SCAT application by 04/09/36.  [name], BSW, MSW, [email or web address] Phone: [phone] Fax: [phone]

## 2017-08-10 ENCOUNTER — Other Ambulatory Visit: Payer: Self-pay | Admitting: Licensed Clinical Social Worker

## 2017-08-10 DIAGNOSIS — J449 Chronic obstructive pulmonary disease, unspecified: Secondary | ICD-10-CM | POA: Diagnosis not present

## 2017-08-10 DIAGNOSIS — M86671 Other chronic osteomyelitis, right ankle and foot: Secondary | ICD-10-CM | POA: Diagnosis not present

## 2017-08-10 DIAGNOSIS — N390 Urinary tract infection, site not specified: Secondary | ICD-10-CM | POA: Diagnosis not present

## 2017-08-10 DIAGNOSIS — G894 Chronic pain syndrome: Secondary | ICD-10-CM | POA: Diagnosis not present

## 2017-08-10 DIAGNOSIS — I252 Old myocardial infarction: Secondary | ICD-10-CM | POA: Diagnosis not present

## 2017-08-10 DIAGNOSIS — I739 Peripheral vascular disease, unspecified: Secondary | ICD-10-CM | POA: Diagnosis not present

## 2017-08-10 DIAGNOSIS — I5022 Chronic systolic (congestive) heart failure: Secondary | ICD-10-CM | POA: Diagnosis not present

## 2017-08-10 DIAGNOSIS — D693 Immune thrombocytopenic purpura: Secondary | ICD-10-CM | POA: Diagnosis not present

## 2017-08-10 DIAGNOSIS — I2510 Atherosclerotic heart disease of native coronary artery without angina pectoris: Secondary | ICD-10-CM | POA: Diagnosis not present

## 2017-08-10 DIAGNOSIS — Z4781 Encounter for orthopedic aftercare following surgical amputation: Secondary | ICD-10-CM | POA: Diagnosis not present

## 2017-08-10 DIAGNOSIS — E43 Unspecified severe protein-calorie malnutrition: Secondary | ICD-10-CM | POA: Diagnosis not present

## 2017-08-10 DIAGNOSIS — I110 Hypertensive heart disease with heart failure: Secondary | ICD-10-CM | POA: Diagnosis not present

## 2017-08-10 DIAGNOSIS — A419 Sepsis, unspecified organism: Secondary | ICD-10-CM | POA: Diagnosis not present

## 2017-08-10 NOTE — Patient Outreach (Signed)
[facility] [facility]) Care Management  08/10/2017  Vincent Dunlap 04-16-1941 [id number]  Assessment- SCAT application was successfully faxed to [place] office on 08/10/17.  Plan-CSW will follow up with patient next week to assist with transportation. CSW will continue to provide social work assistance and support.  Vincent Dunlap, BSW, MSW, [email or web address] Phone: [phone] Fax: [phone]

## 2017-08-11 DIAGNOSIS — Z4781 Encounter for orthopedic aftercare following surgical amputation: Secondary | ICD-10-CM | POA: Diagnosis not present

## 2017-08-11 DIAGNOSIS — I739 Peripheral vascular disease, unspecified: Secondary | ICD-10-CM | POA: Diagnosis not present

## 2017-08-11 DIAGNOSIS — I2510 Atherosclerotic heart disease of native coronary artery without angina pectoris: Secondary | ICD-10-CM | POA: Diagnosis not present

## 2017-08-11 DIAGNOSIS — J449 Chronic obstructive pulmonary disease, unspecified: Secondary | ICD-10-CM | POA: Diagnosis not present

## 2017-08-11 DIAGNOSIS — G894 Chronic pain syndrome: Secondary | ICD-10-CM | POA: Diagnosis not present

## 2017-08-11 DIAGNOSIS — M86671 Other chronic osteomyelitis, right ankle and foot: Secondary | ICD-10-CM | POA: Diagnosis not present

## 2017-08-11 DIAGNOSIS — E43 Unspecified severe protein-calorie malnutrition: Secondary | ICD-10-CM | POA: Diagnosis not present

## 2017-08-11 DIAGNOSIS — N390 Urinary tract infection, site not specified: Secondary | ICD-10-CM | POA: Diagnosis not present

## 2017-08-11 DIAGNOSIS — A419 Sepsis, unspecified organism: Secondary | ICD-10-CM | POA: Diagnosis not present

## 2017-08-11 DIAGNOSIS — I252 Old myocardial infarction: Secondary | ICD-10-CM | POA: Diagnosis not present

## 2017-08-11 DIAGNOSIS — D693 Immune thrombocytopenic purpura: Secondary | ICD-10-CM | POA: Diagnosis not present

## 2017-08-11 DIAGNOSIS — I110 Hypertensive heart disease with heart failure: Secondary | ICD-10-CM | POA: Diagnosis not present

## 2017-08-11 DIAGNOSIS — I5022 Chronic systolic (congestive) heart failure: Secondary | ICD-10-CM | POA: Diagnosis not present

## 2017-08-12 DIAGNOSIS — I110 Hypertensive heart disease with heart failure: Secondary | ICD-10-CM | POA: Diagnosis not present

## 2017-08-12 DIAGNOSIS — M86671 Other chronic osteomyelitis, right ankle and foot: Secondary | ICD-10-CM | POA: Diagnosis not present

## 2017-08-12 DIAGNOSIS — I5022 Chronic systolic (congestive) heart failure: Secondary | ICD-10-CM | POA: Diagnosis not present

## 2017-08-12 DIAGNOSIS — N390 Urinary tract infection, site not specified: Secondary | ICD-10-CM | POA: Diagnosis not present

## 2017-08-12 DIAGNOSIS — G894 Chronic pain syndrome: Secondary | ICD-10-CM | POA: Diagnosis not present

## 2017-08-12 DIAGNOSIS — E43 Unspecified severe protein-calorie malnutrition: Secondary | ICD-10-CM | POA: Diagnosis not present

## 2017-08-12 DIAGNOSIS — I2510 Atherosclerotic heart disease of native coronary artery without angina pectoris: Secondary | ICD-10-CM | POA: Diagnosis not present

## 2017-08-12 DIAGNOSIS — I252 Old myocardial infarction: Secondary | ICD-10-CM | POA: Diagnosis not present

## 2017-08-12 DIAGNOSIS — A419 Sepsis, unspecified organism: Secondary | ICD-10-CM | POA: Diagnosis not present

## 2017-08-12 DIAGNOSIS — Z4781 Encounter for orthopedic aftercare following surgical amputation: Secondary | ICD-10-CM | POA: Diagnosis not present

## 2017-08-12 DIAGNOSIS — I739 Peripheral vascular disease, unspecified: Secondary | ICD-10-CM | POA: Diagnosis not present

## 2017-08-12 DIAGNOSIS — J449 Chronic obstructive pulmonary disease, unspecified: Secondary | ICD-10-CM | POA: Diagnosis not present

## 2017-08-12 DIAGNOSIS — D693 Immune thrombocytopenic purpura: Secondary | ICD-10-CM | POA: Diagnosis not present

## 2017-08-15 ENCOUNTER — Other Ambulatory Visit: Payer: Self-pay | Admitting: Licensed Clinical Social Worker

## 2017-08-15 DIAGNOSIS — E43 Unspecified severe protein-calorie malnutrition: Secondary | ICD-10-CM | POA: Diagnosis not present

## 2017-08-15 DIAGNOSIS — M86671 Other chronic osteomyelitis, right ankle and foot: Secondary | ICD-10-CM | POA: Diagnosis not present

## 2017-08-15 DIAGNOSIS — A419 Sepsis, unspecified organism: Secondary | ICD-10-CM | POA: Diagnosis not present

## 2017-08-15 DIAGNOSIS — G894 Chronic pain syndrome: Secondary | ICD-10-CM | POA: Diagnosis not present

## 2017-08-15 DIAGNOSIS — D693 Immune thrombocytopenic purpura: Secondary | ICD-10-CM | POA: Diagnosis not present

## 2017-08-15 DIAGNOSIS — I110 Hypertensive heart disease with heart failure: Secondary | ICD-10-CM | POA: Diagnosis not present

## 2017-08-15 DIAGNOSIS — N390 Urinary tract infection, site not specified: Secondary | ICD-10-CM | POA: Diagnosis not present

## 2017-08-15 DIAGNOSIS — I2510 Atherosclerotic heart disease of native coronary artery without angina pectoris: Secondary | ICD-10-CM | POA: Diagnosis not present

## 2017-08-15 DIAGNOSIS — J449 Chronic obstructive pulmonary disease, unspecified: Secondary | ICD-10-CM | POA: Diagnosis not present

## 2017-08-15 DIAGNOSIS — I252 Old myocardial infarction: Secondary | ICD-10-CM | POA: Diagnosis not present

## 2017-08-15 DIAGNOSIS — I739 Peripheral vascular disease, unspecified: Secondary | ICD-10-CM | POA: Diagnosis not present

## 2017-08-15 DIAGNOSIS — Z4781 Encounter for orthopedic aftercare following surgical amputation: Secondary | ICD-10-CM | POA: Diagnosis not present

## 2017-08-15 DIAGNOSIS — I5022 Chronic systolic (congestive) heart failure: Secondary | ICD-10-CM | POA: Diagnosis not present

## 2017-08-15 NOTE — Patient Outreach (Signed)
[place] [facility]) Care Management  08/15/2017  Vincent Dunlap Mar 31, 1941 [id number]  Assessment- CSW received return call from [place] at number [phone]. She reports that they are very familiar with patient and had to stop providing services last year as there was a bed bug issue. CSW informed her that [place] Management was not aware of a bed bug issue and that this issue may have been resolved. She informed CSW that she has not be able to reach patient and agreed to contact patient's neighbor in order to establish contact and to start services again.  Plan-CSW will follow up with patient within one week.  [name], BSW, MSW, [email or web address] Phone: [phone] Fax: [phone]

## 2017-08-15 NOTE — Patient Outreach (Signed)
[facility]) Care Management  08/15/2017  Vincent Dunlap January 16, 1941 [id number]  Assessment- CSW completed call to Vincent Dunlap to check on PCS status. CSW was informed that [facility] received patient's request for services and that they accepted him. CSW was informed that agency should be contacting him soon to start aide hours.  CSW completed call to patient's number but was unable to reach him. Phone line is not working. CSW completed call to patient's neighbor and he answered. HIPPA verifications were provided. He reports that PCS have not started yet. CSW informed neighbor that [facility] has probably been trying to contact patient. Neighbor request that CSW contact PCS agency and provide his number to establish contact as patient's phone is still not working.  CSW completed call to [facility] at number [phone]. CSW was unable to reach anyone but left a HIPPA complaint voice message encouraging a return call and provide neighbor's contact number in order to establish contact with patient.  Plan-CSW will continue to provide social work assistance and support.  [name], BSW, MSW, [email or web address] Phone: [phone] Fax: [phone]

## 2017-08-18 ENCOUNTER — Other Ambulatory Visit: Payer: Self-pay

## 2017-08-18 DIAGNOSIS — I2510 Atherosclerotic heart disease of native coronary artery without angina pectoris: Secondary | ICD-10-CM | POA: Diagnosis not present

## 2017-08-18 DIAGNOSIS — E43 Unspecified severe protein-calorie malnutrition: Secondary | ICD-10-CM | POA: Diagnosis not present

## 2017-08-18 DIAGNOSIS — D693 Immune thrombocytopenic purpura: Secondary | ICD-10-CM | POA: Diagnosis not present

## 2017-08-18 DIAGNOSIS — I5022 Chronic systolic (congestive) heart failure: Secondary | ICD-10-CM | POA: Diagnosis not present

## 2017-08-18 DIAGNOSIS — I252 Old myocardial infarction: Secondary | ICD-10-CM | POA: Diagnosis not present

## 2017-08-18 DIAGNOSIS — I739 Peripheral vascular disease, unspecified: Secondary | ICD-10-CM | POA: Diagnosis not present

## 2017-08-18 DIAGNOSIS — N390 Urinary tract infection, site not specified: Secondary | ICD-10-CM | POA: Diagnosis not present

## 2017-08-18 DIAGNOSIS — G894 Chronic pain syndrome: Secondary | ICD-10-CM | POA: Diagnosis not present

## 2017-08-18 DIAGNOSIS — J449 Chronic obstructive pulmonary disease, unspecified: Secondary | ICD-10-CM | POA: Diagnosis not present

## 2017-08-18 DIAGNOSIS — M86671 Other chronic osteomyelitis, right ankle and foot: Secondary | ICD-10-CM | POA: Diagnosis not present

## 2017-08-18 DIAGNOSIS — I110 Hypertensive heart disease with heart failure: Secondary | ICD-10-CM | POA: Diagnosis not present

## 2017-08-18 DIAGNOSIS — Z4781 Encounter for orthopedic aftercare following surgical amputation: Secondary | ICD-10-CM | POA: Diagnosis not present

## 2017-08-18 DIAGNOSIS — A419 Sepsis, unspecified organism: Secondary | ICD-10-CM | POA: Diagnosis not present

## 2017-08-18 NOTE — Patient Outreach (Signed)
[place] [place] [facility]) Care Management  08/18/2017  Vincent Dunlap Jul 20, 1941 [id number]   Subjective: none  Objective: none  Assessment: 76 year old with recent admission for osteomyelitis right second toe/amputation Right second toe. History of Heart failure, HTN, Myocardial infarction, UTI, COPD, sepsis. Home health nursing and physical therapy with [facility].  RNCM called for transition of care. No answer.   Plan: Transition of care call next week.  [name], RN, MSN, [place] Coordinator Cell: [phone]

## 2017-08-19 ENCOUNTER — Encounter (HOSPITAL_COMMUNITY): Payer: Medicare Other

## 2017-08-19 ENCOUNTER — Other Ambulatory Visit: Payer: Self-pay | Admitting: Licensed Clinical Social Worker

## 2017-08-19 NOTE — Patient Outreach (Signed)
[facility] [facility]) Care Management  08/19/2017  Vincent Dunlap 08/29/1941 [id number]  Assessment-CSW completed outreach attempt today. CSW unable to reach patient successfully. CSW unable to leave a voice message.  Plan-CSW will await return call or complete an additional outreach if needed.  [name], BSW, MSW, [email or web address] Phone: [phone] Fax: [phone]

## 2017-08-22 ENCOUNTER — Encounter (HOSPITAL_COMMUNITY): Payer: Self-pay

## 2017-08-22 ENCOUNTER — Inpatient Hospital Stay (HOSPITAL_COMMUNITY)
Admission: RE | Admit: 2017-08-22 | Discharge: 2017-08-22 | Disposition: A | Discharge status: Home / Self Care | Payer: Medicare Other | Source: Ambulatory Visit | Attending: Vascular Surgery | Admitting: Vascular Surgery

## 2017-08-22 DIAGNOSIS — I739 Peripheral vascular disease, unspecified: Secondary | ICD-10-CM

## 2017-08-23 ENCOUNTER — Other Ambulatory Visit: Payer: Self-pay

## 2017-08-23 DIAGNOSIS — E43 Unspecified severe protein-calorie malnutrition: Secondary | ICD-10-CM | POA: Diagnosis not present

## 2017-08-23 DIAGNOSIS — N390 Urinary tract infection, site not specified: Secondary | ICD-10-CM | POA: Diagnosis not present

## 2017-08-23 DIAGNOSIS — I252 Old myocardial infarction: Secondary | ICD-10-CM | POA: Diagnosis not present

## 2017-08-23 DIAGNOSIS — A419 Sepsis, unspecified organism: Secondary | ICD-10-CM | POA: Diagnosis not present

## 2017-08-23 DIAGNOSIS — I110 Hypertensive heart disease with heart failure: Secondary | ICD-10-CM | POA: Diagnosis not present

## 2017-08-23 DIAGNOSIS — J449 Chronic obstructive pulmonary disease, unspecified: Secondary | ICD-10-CM | POA: Diagnosis not present

## 2017-08-23 DIAGNOSIS — I5022 Chronic systolic (congestive) heart failure: Secondary | ICD-10-CM | POA: Diagnosis not present

## 2017-08-23 DIAGNOSIS — G894 Chronic pain syndrome: Secondary | ICD-10-CM | POA: Diagnosis not present

## 2017-08-23 DIAGNOSIS — D693 Immune thrombocytopenic purpura: Secondary | ICD-10-CM | POA: Diagnosis not present

## 2017-08-23 DIAGNOSIS — M86671 Other chronic osteomyelitis, right ankle and foot: Secondary | ICD-10-CM | POA: Diagnosis not present

## 2017-08-23 DIAGNOSIS — Z4781 Encounter for orthopedic aftercare following surgical amputation: Secondary | ICD-10-CM | POA: Diagnosis not present

## 2017-08-23 DIAGNOSIS — I2510 Atherosclerotic heart disease of native coronary artery without angina pectoris: Secondary | ICD-10-CM | POA: Diagnosis not present

## 2017-08-23 DIAGNOSIS — I739 Peripheral vascular disease, unspecified: Secondary | ICD-10-CM | POA: Diagnosis not present

## 2017-08-23 NOTE — Patient Outreach (Signed)
[facility]) Care Management  08/23/2017  Vincent Dunlap 11-02-1941 [id number]   Subjective: none  Objective: none  Assessment: 76 year old with recent admission for osteomyelitis right second toe/amputation Right second toe. History of Heart failure, HTN, Myocardial infarction, UTI, COPD, sepsis. Home health nursing and physical therapy with [facility].  RNCM called to follow up: [phone]-unable to leave message; RNCM attempted to call emergency contact numbers Felicity Pellegrini: [phone] unable to leave message and [phone] HIPPA compliant message left.  Plan: await return call. Follow up next week if no return call.  [name], RN, MSN, [place] Coordinator Cell: [phone]

## 2017-08-25 DIAGNOSIS — E43 Unspecified severe protein-calorie malnutrition: Secondary | ICD-10-CM | POA: Diagnosis not present

## 2017-08-25 DIAGNOSIS — M86671 Other chronic osteomyelitis, right ankle and foot: Secondary | ICD-10-CM | POA: Diagnosis not present

## 2017-08-25 DIAGNOSIS — A419 Sepsis, unspecified organism: Secondary | ICD-10-CM | POA: Diagnosis not present

## 2017-08-25 DIAGNOSIS — I2510 Atherosclerotic heart disease of native coronary artery without angina pectoris: Secondary | ICD-10-CM | POA: Diagnosis not present

## 2017-08-25 DIAGNOSIS — N390 Urinary tract infection, site not specified: Secondary | ICD-10-CM | POA: Diagnosis not present

## 2017-08-25 DIAGNOSIS — J449 Chronic obstructive pulmonary disease, unspecified: Secondary | ICD-10-CM | POA: Diagnosis not present

## 2017-08-25 DIAGNOSIS — Z4781 Encounter for orthopedic aftercare following surgical amputation: Secondary | ICD-10-CM | POA: Diagnosis not present

## 2017-08-25 DIAGNOSIS — I739 Peripheral vascular disease, unspecified: Secondary | ICD-10-CM | POA: Diagnosis not present

## 2017-08-25 DIAGNOSIS — I5022 Chronic systolic (congestive) heart failure: Secondary | ICD-10-CM | POA: Diagnosis not present

## 2017-08-25 DIAGNOSIS — I110 Hypertensive heart disease with heart failure: Secondary | ICD-10-CM | POA: Diagnosis not present

## 2017-08-25 DIAGNOSIS — I252 Old myocardial infarction: Secondary | ICD-10-CM | POA: Diagnosis not present

## 2017-08-25 DIAGNOSIS — G894 Chronic pain syndrome: Secondary | ICD-10-CM | POA: Diagnosis not present

## 2017-08-25 DIAGNOSIS — D693 Immune thrombocytopenic purpura: Secondary | ICD-10-CM | POA: Diagnosis not present

## 2017-08-26 ENCOUNTER — Other Ambulatory Visit: Payer: Self-pay | Admitting: Licensed Clinical Social Worker

## 2017-08-26 ENCOUNTER — Ambulatory Visit: Payer: Medicare Other | Admitting: Vascular Surgery

## 2017-08-26 NOTE — Patient Outreach (Signed)
[place] [facility]) Care Management  08/26/2017  PRATT BRESS October 28, 1941 [id number]  Assessment-CSW completed outreach attempt today to both patient's number and patient's neighbor. CSW unable to reach patient successfully or leave a voice message. CSW was unable to reach neighbor but CSW left a HIPPA compliant voice message encouraging patient or neighbor to return call once available.  Plan-CSW will await return call or complete an additional outreach if needed.   [name], BSW, MSW, [email or web address] Phone: [phone] Fax: [phone]

## 2017-08-29 ENCOUNTER — Other Ambulatory Visit: Payer: Self-pay

## 2017-08-29 ENCOUNTER — Other Ambulatory Visit: Payer: Self-pay | Admitting: Licensed Clinical Social Worker

## 2017-08-29 NOTE — Patient Outreach (Signed)
[facility] [facility]) Care Management  08/29/2017  TRYSON LUMLEY 04-Oct-1941 [id number]   Subjective: none  Objective: none  Assessment: 76 year old with recent admission for osteomyelitis right second toe/amputation Right second toe. History of Heart failure, HTN, Myocardial infarction, UTI, COPD, sepsis.  RNCM called to follow up: unable to reach. Third attempt.  Plan: RNCM will send outreach letter.  [name], RN, MSN, [place] Coordinator Cell: [phone]

## 2017-08-29 NOTE — Patient Outreach (Signed)
[facility] [facility]) Care Management  08/29/2017  Vincent Dunlap [birth year] [id number]  Assessment-CSW completed last and final outreach attempt today. CSW unable to reach patient successfully or neighbor Tim. CSW left a HIPPA compliant voice message encouraging patient to return call once available if still interested in [place].  Plan-THN RNCM will mail outreach barrier letter out to patient at this time and we will await 10 business days to hear back before completing case closure.   [name], BSW, MSW, [email or web address] Phone: [phone] Fax: [phone]

## 2017-08-29 NOTE — Patient Outreach (Signed)
[facility] [facility]) Care Management  08/29/2017  KAUSHAL VANNICE [birth year] [id number]   Subjective: none  Objective: none  Assessment: 76 year old with recent admission for osteomyelitis right second toe/amputation Right second toe. History of Heart failure, HTN, Myocardial infarction, UTI, COPD, sepsis.  RNCM called [phone] to follow up. Voice message left.  Plan: outreach letter sent today.  [name], RN, MSN, [place] Coordinator Cell: [phone]

## 2017-08-29 NOTE — Patient Outreach (Signed)
[facility]) Care Management  08/29/2017  CHEE KINSLOW 06-Nov-1941 [id number]   Care Coordination: RNCM called [facility] regarding inability to contact client-spoke with Dana Allan, CM in the office who reports home health nurse is seeing client. She confirmed client's contact number as [phone].  Per Ms. Hairston, client "drinks alcohol", "empty beer cans" around. He doesn't take his medicine". She reports [facility] made home visit and was going to assist with transportation, but client informed her that [facility] care management was assisting client with transportation needs. She is unclear if client made it to his follow up appointments. She states "he is not helping himself". RNCM request home health nurse discuss [place] management outreach attempt and encourage client to follow up.  Plan: continue to follow.  [name], RN, MSN, [place] Coordinator Cell: [phone]

## 2017-08-30 DIAGNOSIS — I110 Hypertensive heart disease with heart failure: Secondary | ICD-10-CM | POA: Diagnosis not present

## 2017-08-30 DIAGNOSIS — J449 Chronic obstructive pulmonary disease, unspecified: Secondary | ICD-10-CM | POA: Diagnosis not present

## 2017-08-30 DIAGNOSIS — I739 Peripheral vascular disease, unspecified: Secondary | ICD-10-CM | POA: Diagnosis not present

## 2017-08-30 DIAGNOSIS — Z4781 Encounter for orthopedic aftercare following surgical amputation: Secondary | ICD-10-CM | POA: Diagnosis not present

## 2017-08-30 DIAGNOSIS — I252 Old myocardial infarction: Secondary | ICD-10-CM | POA: Diagnosis not present

## 2017-08-30 DIAGNOSIS — M86671 Other chronic osteomyelitis, right ankle and foot: Secondary | ICD-10-CM | POA: Diagnosis not present

## 2017-08-30 DIAGNOSIS — A419 Sepsis, unspecified organism: Secondary | ICD-10-CM | POA: Diagnosis not present

## 2017-08-30 DIAGNOSIS — G894 Chronic pain syndrome: Secondary | ICD-10-CM | POA: Diagnosis not present

## 2017-08-30 DIAGNOSIS — I5022 Chronic systolic (congestive) heart failure: Secondary | ICD-10-CM | POA: Diagnosis not present

## 2017-08-30 DIAGNOSIS — I2510 Atherosclerotic heart disease of native coronary artery without angina pectoris: Secondary | ICD-10-CM | POA: Diagnosis not present

## 2017-08-30 DIAGNOSIS — N390 Urinary tract infection, site not specified: Secondary | ICD-10-CM | POA: Diagnosis not present

## 2017-08-30 DIAGNOSIS — D693 Immune thrombocytopenic purpura: Secondary | ICD-10-CM | POA: Diagnosis not present

## 2017-08-30 DIAGNOSIS — E43 Unspecified severe protein-calorie malnutrition: Secondary | ICD-10-CM | POA: Diagnosis not present

## 2017-08-31 DIAGNOSIS — I739 Peripheral vascular disease, unspecified: Secondary | ICD-10-CM | POA: Diagnosis not present

## 2017-08-31 DIAGNOSIS — A419 Sepsis, unspecified organism: Secondary | ICD-10-CM | POA: Diagnosis not present

## 2017-08-31 DIAGNOSIS — M86671 Other chronic osteomyelitis, right ankle and foot: Secondary | ICD-10-CM | POA: Diagnosis not present

## 2017-08-31 DIAGNOSIS — J449 Chronic obstructive pulmonary disease, unspecified: Secondary | ICD-10-CM | POA: Diagnosis not present

## 2017-08-31 DIAGNOSIS — N390 Urinary tract infection, site not specified: Secondary | ICD-10-CM | POA: Diagnosis not present

## 2017-08-31 DIAGNOSIS — I2510 Atherosclerotic heart disease of native coronary artery without angina pectoris: Secondary | ICD-10-CM | POA: Diagnosis not present

## 2017-08-31 DIAGNOSIS — G894 Chronic pain syndrome: Secondary | ICD-10-CM | POA: Diagnosis not present

## 2017-08-31 DIAGNOSIS — D693 Immune thrombocytopenic purpura: Secondary | ICD-10-CM | POA: Diagnosis not present

## 2017-08-31 DIAGNOSIS — I110 Hypertensive heart disease with heart failure: Secondary | ICD-10-CM | POA: Diagnosis not present

## 2017-08-31 DIAGNOSIS — Z4781 Encounter for orthopedic aftercare following surgical amputation: Secondary | ICD-10-CM | POA: Diagnosis not present

## 2017-08-31 DIAGNOSIS — I252 Old myocardial infarction: Secondary | ICD-10-CM | POA: Diagnosis not present

## 2017-08-31 DIAGNOSIS — I5022 Chronic systolic (congestive) heart failure: Secondary | ICD-10-CM | POA: Diagnosis not present

## 2017-08-31 DIAGNOSIS — E43 Unspecified severe protein-calorie malnutrition: Secondary | ICD-10-CM | POA: Diagnosis not present

## 2017-09-01 DIAGNOSIS — G894 Chronic pain syndrome: Secondary | ICD-10-CM | POA: Diagnosis not present

## 2017-09-01 DIAGNOSIS — E43 Unspecified severe protein-calorie malnutrition: Secondary | ICD-10-CM | POA: Diagnosis not present

## 2017-09-01 DIAGNOSIS — M86671 Other chronic osteomyelitis, right ankle and foot: Secondary | ICD-10-CM | POA: Diagnosis not present

## 2017-09-01 DIAGNOSIS — J449 Chronic obstructive pulmonary disease, unspecified: Secondary | ICD-10-CM | POA: Diagnosis not present

## 2017-09-01 DIAGNOSIS — I5022 Chronic systolic (congestive) heart failure: Secondary | ICD-10-CM | POA: Diagnosis not present

## 2017-09-01 DIAGNOSIS — D693 Immune thrombocytopenic purpura: Secondary | ICD-10-CM | POA: Diagnosis not present

## 2017-09-01 DIAGNOSIS — N390 Urinary tract infection, site not specified: Secondary | ICD-10-CM | POA: Diagnosis not present

## 2017-09-01 DIAGNOSIS — I2510 Atherosclerotic heart disease of native coronary artery without angina pectoris: Secondary | ICD-10-CM | POA: Diagnosis not present

## 2017-09-01 DIAGNOSIS — Z4781 Encounter for orthopedic aftercare following surgical amputation: Secondary | ICD-10-CM | POA: Diagnosis not present

## 2017-09-01 DIAGNOSIS — I252 Old myocardial infarction: Secondary | ICD-10-CM | POA: Diagnosis not present

## 2017-09-01 DIAGNOSIS — I110 Hypertensive heart disease with heart failure: Secondary | ICD-10-CM | POA: Diagnosis not present

## 2017-09-01 DIAGNOSIS — I739 Peripheral vascular disease, unspecified: Secondary | ICD-10-CM | POA: Diagnosis not present

## 2017-09-01 DIAGNOSIS — A419 Sepsis, unspecified organism: Secondary | ICD-10-CM | POA: Diagnosis not present

## 2017-09-09 ENCOUNTER — Other Ambulatory Visit: Payer: Self-pay | Admitting: Licensed Clinical Social Worker

## 2017-09-09 NOTE — Patient Outreach (Signed)
[place] [facility]) Care Management  09/09/2017  Vincent Dunlap 21-Jul-1941 [id number]  Assessment- THN has mailed outreach barrier letter out to patient and waited 10 business days to hear back without success. CSW will complete case closure at this time.  Plan-CSW will update THN RNCM in order to finish case closure process.  [name], BSW, MSW, [email or web address] Phone: [phone] Fax: [phone]

## 2017-09-12 ENCOUNTER — Other Ambulatory Visit: Payer: Self-pay

## 2017-09-12 NOTE — Patient Outreach (Signed)
[place] [facility]) Care Management  09/12/2017  JONAS GOH [birth year] [id number]   Case Closure: numerous outreach attempts with no response. No response from outreach letter.  Plan: close case.  [name], RN, MSN, [place] Coordinator Cell: [phone]

## 2017-09-13 ENCOUNTER — Ambulatory Visit: Payer: Medicare Other

## 2017-10-14 ENCOUNTER — Inpatient Hospital Stay (HOSPITAL_COMMUNITY)
Admission: EM | Admit: 2017-10-14 | Discharge: 2017-10-20 | DRG: 313 | Disposition: A | Discharge status: Skilled Nursing Facility | Payer: Medicare Other | Attending: Internal Medicine | Admitting: Internal Medicine

## 2017-10-14 ENCOUNTER — Emergency Department (HOSPITAL_COMMUNITY): Payer: Medicare Other

## 2017-10-14 ENCOUNTER — Other Ambulatory Visit: Payer: Self-pay

## 2017-10-14 ENCOUNTER — Encounter (HOSPITAL_COMMUNITY): Payer: Self-pay | Admitting: Emergency Medicine

## 2017-10-14 DIAGNOSIS — Z609 Problem related to social environment, unspecified: Secondary | ICD-10-CM | POA: Diagnosis not present

## 2017-10-14 DIAGNOSIS — I2510 Atherosclerotic heart disease of native coronary artery without angina pectoris: Secondary | ICD-10-CM | POA: Diagnosis present

## 2017-10-14 DIAGNOSIS — I739 Peripheral vascular disease, unspecified: Secondary | ICD-10-CM | POA: Diagnosis not present

## 2017-10-14 DIAGNOSIS — Z6821 Body mass index (BMI) 21.0-21.9, adult: Secondary | ICD-10-CM | POA: Diagnosis not present

## 2017-10-14 DIAGNOSIS — R0789 Other chest pain: Principal | ICD-10-CM | POA: Diagnosis present

## 2017-10-14 DIAGNOSIS — S51001A Unspecified open wound of right elbow, initial encounter: Secondary | ICD-10-CM | POA: Diagnosis present

## 2017-10-14 DIAGNOSIS — R079 Chest pain, unspecified: Secondary | ICD-10-CM | POA: Diagnosis present

## 2017-10-14 DIAGNOSIS — I5022 Chronic systolic (congestive) heart failure: Secondary | ICD-10-CM | POA: Diagnosis present

## 2017-10-14 DIAGNOSIS — E222 Syndrome of inappropriate secretion of antidiuretic hormone: Secondary | ICD-10-CM | POA: Diagnosis present

## 2017-10-14 DIAGNOSIS — M25821 Other specified joint disorders, right elbow: Secondary | ICD-10-CM | POA: Diagnosis present

## 2017-10-14 DIAGNOSIS — Z79899 Other long term (current) drug therapy: Secondary | ICD-10-CM | POA: Diagnosis not present

## 2017-10-14 DIAGNOSIS — J449 Chronic obstructive pulmonary disease, unspecified: Secondary | ICD-10-CM | POA: Diagnosis present

## 2017-10-14 DIAGNOSIS — Z7982 Long term (current) use of aspirin: Secondary | ICD-10-CM

## 2017-10-14 DIAGNOSIS — D509 Iron deficiency anemia, unspecified: Secondary | ICD-10-CM | POA: Diagnosis present

## 2017-10-14 DIAGNOSIS — X58XXXA Exposure to other specified factors, initial encounter: Secondary | ICD-10-CM | POA: Diagnosis present

## 2017-10-14 DIAGNOSIS — I110 Hypertensive heart disease with heart failure: Secondary | ICD-10-CM | POA: Diagnosis not present

## 2017-10-14 DIAGNOSIS — E871 Hypo-osmolality and hyponatremia: Secondary | ICD-10-CM

## 2017-10-14 DIAGNOSIS — G629 Polyneuropathy, unspecified: Secondary | ICD-10-CM | POA: Diagnosis not present

## 2017-10-14 DIAGNOSIS — E440 Moderate protein-calorie malnutrition: Secondary | ICD-10-CM | POA: Diagnosis not present

## 2017-10-14 DIAGNOSIS — Z23 Encounter for immunization: Secondary | ICD-10-CM

## 2017-10-14 DIAGNOSIS — Z7902 Long term (current) use of antithrombotics/antiplatelets: Secondary | ICD-10-CM

## 2017-10-14 DIAGNOSIS — I25118 Atherosclerotic heart disease of native coronary artery with other forms of angina pectoris: Secondary | ICD-10-CM | POA: Diagnosis not present

## 2017-10-14 DIAGNOSIS — R911 Solitary pulmonary nodule: Secondary | ICD-10-CM | POA: Diagnosis not present

## 2017-10-14 DIAGNOSIS — I255 Ischemic cardiomyopathy: Secondary | ICD-10-CM | POA: Diagnosis not present

## 2017-10-14 DIAGNOSIS — D519 Vitamin B12 deficiency anemia, unspecified: Secondary | ICD-10-CM | POA: Diagnosis present

## 2017-10-14 DIAGNOSIS — F17210 Nicotine dependence, cigarettes, uncomplicated: Secondary | ICD-10-CM | POA: Diagnosis present

## 2017-10-14 HISTORY — DX: Heart failure, unspecified: I50.9

## 2017-10-14 LAB — CBC WITH DIFFERENTIAL/PLATELET
BASOS ABS: 0.1 10*3/uL (ref 0.0–0.1)
Basophils Relative: 1 %
EOS PCT: 2 %
Eosinophils Absolute: 0.2 10*3/uL (ref 0.0–0.7)
HEMATOCRIT: 29.6 % — AB (ref 39.0–52.0)
Hemoglobin: 9.9 g/dL — ABNORMAL LOW (ref 13.0–17.0)
LYMPHS PCT: 26 %
Lymphs Abs: 3.3 10*3/uL (ref 0.7–4.0)
MCH: 25.2 pg — ABNORMAL LOW (ref 26.0–34.0)
MCHC: 33.4 g/dL (ref 30.0–36.0)
MCV: 75.3 fL — AB (ref 78.0–100.0)
Monocytes Absolute: 1.2 10*3/uL — ABNORMAL HIGH (ref 0.1–1.0)
Monocytes Relative: 9 %
NEUTROS ABS: 7.9 10*3/uL — AB (ref 1.7–7.7)
NEUTROS PCT: 62 %
PLATELETS: 139 10*3/uL — AB (ref 150–400)
RBC: 3.93 MIL/uL — AB (ref 4.22–5.81)
RDW: 17.5 % — ABNORMAL HIGH (ref 11.5–15.5)
WBC: 12.7 10*3/uL — AB (ref 4.0–10.5)

## 2017-10-14 LAB — BASIC METABOLIC PANEL
ANION GAP: 12 (ref 5–15)
BUN: 13 mg/dL (ref 6–20)
CO2: 21 mmol/L — ABNORMAL LOW (ref 22–32)
Calcium: 8.7 mg/dL — ABNORMAL LOW (ref 8.9–10.3)
Chloride: 92 mmol/L — ABNORMAL LOW (ref 101–111)
Creatinine, Ser: 0.56 mg/dL — ABNORMAL LOW (ref 0.61–1.24)
GFR calc Af Amer: >60 mL/min (ref >60)
GLUCOSE: 87 mg/dL (ref 65–99)
POTASSIUM: 5.1 mmol/L (ref 3.5–5.1)
Sodium: 125 mmol/L — ABNORMAL LOW (ref 135–145)

## 2017-10-14 LAB — I-STAT TROPONIN, ED
TROPONIN I, POC: 0 ng/mL (ref 0.00–0.08)
Troponin i, poc: 0.01 ng/mL (ref 0.00–0.08)

## 2017-10-14 LAB — BRAIN NATRIURETIC PEPTIDE: B NATRIURETIC PEPTIDE 5: 444.3 pg/mL — AB (ref 0.0–100.0)

## 2017-10-14 NOTE — Evaluation (Signed)
Physical Therapy Evaluation Patient Details Name: Vincent Dunlap MRN: [id number] DOB: 08/27/1941 Today's Date: 10/14/2017   History of Present Illness  Patient is a 76 y/o male admitted due to chest pain.  PMH positive for HTN, hyperlipidemia, COPD, GERD, depression, CHF w/ EF 30-35%, CAD, chronic thrombocytopenia.   Clinical Impression  Patient presents with decreased mobility due to LE weakness, decreased balance with poor LE sensation, decreased safety awareness and currently mod A for transfers.  Patient was staying immobile in his home and waiting for friends to come assist for mobility.  Feel he is unable to return home alone and will need STSNF level rehab initially to address issues and maximize mobility and safety.  Patient also interested in possible ALF transition due to feeling unable to return to his home.  PT to follow acutely.     Follow Up Recommendations SNF;Supervision/Assistance - 24 hour    Equipment Recommendations  None recommended by PT    Recommendations for Other Services       Precautions / Restrictions Precautions Precautions: Fall      Mobility  Bed Mobility Overal bed mobility: Needs Assistance Bed Mobility: Rolling;Supine to Sit Rolling: Supervision   Supine to sit: Min guard     General bed mobility comments: for safety on gurney, increased time  Transfers Overall transfer level: Needs assistance   Transfers: Stand Pivot Transfers   Stand pivot transfers: Mod assist       General transfer comment: lifting and lowering help from gurney to armchair and back due to LE weakness and pt fear of falling due to legs giving out  Ambulation/Gait                Stairs            Wheelchair Mobility    Modified Rankin (Stroke Patients Only)       Balance Overall balance assessment: Needs assistance   Sitting balance-Leahy Scale: Fair       Standing balance-Leahy Scale: Poor Standing balance comment: unable to stand  unaided                             Pertinent Vitals/Pain Pain Assessment: Faces Faces Pain Scale: Hurts little more Pain Location: feet with weight bearing Pain Descriptors / Indicators: Tender Pain Intervention(s): Monitored during session    Home Living Family/patient expects to be discharged to:: Skilled nursing facility   Available Help at Discharge: Friend(s);Available PRN/intermittently Type of Home: House Home Access: Stairs to enter Entrance Stairs-Rails: Right Entrance Stairs-Number of Steps: 2 Home Layout: One level Home Equipment: Shower seat;Grab bars - tub/shower;Walker - 2 wheels;Cane - single point;Bedside commode;Crutches;Wheelchair - manual      Prior Function Level of Independence: Needs assistance   Gait / Transfers Assistance Needed: reports has to have help to transfer to recliner or w/c  ADL's / Homemaking Assistance Needed: friends bring food and help with transfers, he has an orange juice jug uses for urinal at home, states has BSC but cannot get on it by himself        Hand Dominance   Dominant Hand: Right    Extremity/Trunk Assessment   Upper Extremity Assessment Upper Extremity Assessment: RUE deficits/detail;LUE deficits/detail RUE Deficits / Details: very limited shoulder elevation about 30 degrees, painful with attempted AAROM; elbow extension only to about 30 degrees from neutral; hands with severe arthritic changes and has no grip but pincer grasp LUE Deficits / Details:  limited shoulder elevation to about 40 degrees, elbow extension to 10, hands not as severe as R but with some arthritic changes and can grip slightly better    Lower Extremity Assessment Lower Extremity Assessment: RLE deficits/detail;LLE deficits/detail RLE Deficits / Details: AROM grossly WFL, but limited ankle DF, strength 3/5 hip flexion 3+/5 knee extension, ankle DF 2/5 LLE Deficits / Details: AROM limited hip and knee flexion due to weakness, strength  grossly 3/5    Cervical / Trunk Assessment Cervical / Trunk Assessment: Kyphotic  Communication      Cognition Arousal/Alertness: Awake/alert Behavior During Therapy: WFL for tasks assessed/performed Overall Cognitive Status: Within Functional Limits for tasks assessed                                        General Comments General comments (skin integrity, edema, etc.): patient reports he is ready to give up his house due to just not able to maintain on his own and reports those that help him only come intermittently    Exercises     Assessment/Plan    PT Assessment Patient needs continued PT services  PT Problem List Decreased strength;Decreased mobility;Decreased range of motion;Decreased activity tolerance;Decreased balance       PT Treatment Interventions DME instruction;Therapeutic activities;Patient/family education;Therapeutic exercise;Gait training;Balance training;Functional mobility training;Wheelchair mobility training    PT Goals (Current goals can be found in the Care Plan section)  Acute Rehab PT Goals Patient Stated Goal: To get some help PT Goal Formulation: With patient Time For Goal Achievement: 10/28/17 Potential to Achieve Goals: Fair    Frequency Min 3X/week   Barriers to discharge        Co-evaluation               AM-PAC PT "6 Clicks" Daily Activity  Outcome Measure Difficulty turning over in bed (including adjusting bedclothes, sheets and blankets)?: A Little Difficulty moving from lying on back to sitting on the side of the bed? : Unable Difficulty sitting down on and standing up from a chair with arms (e.g., wheelchair, bedside commode, etc,.)?: Unable Help needed moving to and from a bed to chair (including a wheelchair)?: A Lot Help needed walking in hospital room?: Total Help needed climbing 3-5 steps with a railing? : Total 6 Click Score: 9    End of Session Equipment Utilized During Treatment: Gait  belt Activity Tolerance: Patient tolerated treatment well Patient left: in bed   PT Visit Diagnosis: Unsteadiness on feet (R26.81);Muscle weakness (generalized) (M62.81)    Time: 7026-3785 PT Time Calculation (min) (ACUTE ONLY): 27 min   Charges:   PT Evaluation $PT Eval Moderate Complexity: 1 Mod PT Treatments $Therapeutic Activity: 8-22 mins   PT G Codes:   PT G-Codes **NOT FOR INPATIENT CLASS** Functional Assessment Tool Used: AM-PAC 6 Clicks Basic Mobility Functional Limitation: Mobility: Walking and moving around Mobility: Walking and Moving Around Current Status (Y8502): At least 60 percent but less than 80 percent impaired, limited or restricted Mobility: Walking and Moving Around Goal Status [phone]): At least 40 percent but less than 60 percent impaired, limited or restricted    [facility], [place] 10/14/2017   Reginia Naas 10/14/2017, 4:52 PM

## 2017-10-14 NOTE — ED Provider Notes (Signed)
I saw and evaluated the patient, reviewed the resident's note and I agree with the findings and plan.   EKG Interpretation  Date/Time:  Friday October 14 2017 15:32:13 EST Ventricular Rate:  91 PR Interval:    QRS Duration: 105 QT Interval:  371 QTC Calculation: 457 R Axis:   75 Text Interpretation:  Sinus rhythm Abnormal R-wave progression, early transition Nonspecific repol abnormality, lateral leads Confirmed by Lacretia Leigh ([id number]) on 10/14/2017 4:41:37 PM      76 year old male presents with acute onset of substernal chest pain which occurred at rest.  Was relieved after taking 2 nitroglycerin tablets.  EKG without acute ischemic changes.  Will admit for cardiac evaluation   Lacretia Leigh, MD 10/14/17 1658

## 2017-10-14 NOTE — H&P (Signed)
History and Physical   Vincent Dunlap TKW:[id number] DOB: [birth year] DOA: 10/14/2017  PCP: Thressa Sheller, MD  Chief Complaint: chest pain  HPI:  This 76 year old man with coronary artery disease who has triple-vessel disease deemed not a candidate for CABG due to comorbidities and frailty, history of heart failure with most recent echo revealing improvement of ejection fraction to 55%, current every day smoker, COPD, peripheral arterial disease, and poor social living situation presenting to the emergency department with chest pain.  Patient reports living [place], he is a retired Building control surveyor, he reports needing help with his ADLs. His roommate moved out 4-5 days prior to admission and he has been a difficult position. He receives one meal a day for Meals on Wheels and otherwise snacks or whatever is available. He reports adequate by mouth intake as far as fluids ago. He reports drinking alcohol at times up to 6-7 beers a day, last consumed that amount 2 weeks ago according to his report. He did having difficulty getting out of his chair, citing that when he does he becomes dizzy and lightheaded. On day of admission he developed central chest pain that lasts for 30 minutes, described as sharp, exacerbated by emotional stress. He took a sublingual nitroglycerin and was taken to the hospital for further evaluation.  Reports some wheezing, no fevers, chills, productive cough.  He reports other symptoms including chronic paresthesias of the feet. Cannot recall which medications he takes at home.  ED Course: In transit via EMS received aspirin 324 mg, as well as 2 sublingual nitroglycerin. Vital signs were relatively unremarkable for normal heart rate normal respiratory rate and systolic blood pressure 924 mm Hg.  lab work remarkable for hyponatremia to 125, BNP of 444, white count 12.7, hemoglobin 9.9, MCV 75, platelet count of 139.  Due to his symptomatic improvement in transit he was not given any  medications from the emergency department. EKG did not reveal any ST segment elevations.  Chest x-ray did not reveal any active pulmonary disease, as well as chronic interstitial lung disease.  Review of Systems: A complete ROS was obtained; pertinent positives negatives are denoted in the HPI. Otherwise, all systems are negative.   Past Medical History:  Diagnosis Date  . CAD (coronary artery disease) 07/2016   3 vessel disease,  evaluated by Dr Servando Snare and felt to be a poor surgical candidate  . Hypertension   . Ischemic cardiomyopathy 2017   EF 30%  . Tobacco abuse    Social History   Socioeconomic History  . Marital status: Single    Spouse name: Not on file  . Number of children: Not on file  . Years of education: Not on file  . Highest education level: Not on file  Social Needs  . Financial resource strain: Not on file  . Food insecurity - worry: Not on file  . Food insecurity - inability: Not on file  . Transportation needs - medical: Not on file  . Transportation needs - non-medical: Not on file  Occupational History  . Not on file  Tobacco Use  . Smoking status: Current Every Day Smoker  . Smokeless tobacco: Former Systems developer  . Tobacco comment: 1 pack per day ages [age]  Substance and Sexual Activity  . Alcohol use: No    Comment: Former heavy binge drinker until 2014  . Drug use: No  . Sexual activity: Not on file  Other Topics Concern  . Not on file  Social History Narrative  . Not  on file   Family History  Problem Relation Age of Onset  . Alzheimer's disease Mother   . Diabetes Mother        Diabetes in the maternal side of the family but not in mother  . Heart disease Neg Hx   . Cancer Neg Hx     Physical Exam: Vitals:   10/14/17 1526 10/14/17 1545 10/14/17 1949 10/14/17 2000  BP:  120/73  130/70  Pulse:  91  88  Resp:  14  16  Temp:  98.8 F (37.1 C)  98.3 F (36.8 C)  TempSrc:  Temporal    SpO2: 98% 100%  99%  Weight:  71.2 kg (157 lb) 71.2 kg  (157 lb)   Height:   5' 11"  (1.803 m)    General: Disheveled and chronically ill appearing white man, no acute distress. ENT: Grossly normal hearing, MMM, poor dentition  Cardiovascular: RRR. No M/R/G. No LE edema. No JVD appreciated.  No LE edema. Respiratory: Wheezing present diffusely, globally reduced air movement.. Normal respiratory effort.  Breathing Room air; speaking in complete sentences. Abdomen: Soft, non-tender. Bowel sounds present.  Skin: No rash or induration seen on limited exam, right hand with tobacco stained hands. Feet with peeling skin. Musculoskeletal: Grossly normal tone BUE/BLE. Appropriate ROM.  Globally reduced muscle mass. R forearm with tophaceous appearing nodule / mass.  Toe amputation present. Psychiatric: Grossly normal mood and affect. Neurologic: Moves all extremities in coordinated fashion. Oriented to current year and hospital.  Mildly hard of hearing.  I have personally reviewed the following labs, culture data, and imaging studies.  Assessment/Plan:  #Chest pain in setting of CAD, prior HF (now preserved EF), and peripheral arterial disease No ST segment elevations, troponin within normal limits x2, known 3 vessel coronary disease.  Chest pain has returned to baseline.  From review of cardiology notes, appears that the patient has hx of stable angina. PLAN: Will continue dual antiplatelet therapy with ASA + clopidogrel Telemetry, trend troponin Consider cardiology consultation if CP recurs Continue statin Continue other home meds: BB, ACEi, long acting nitrate  #Hyponatremia Uncertain driver.  Clinically does not appear volume overloaded despite the presence of elevated BNP.  He reports adequate PO intake, although based on his home situation would not be surprised if he has had decreased PO fluid intake, potentially exacerbated to ETOH use.  His dizziness prompting admission is possibly related to his hyponatremia. PLAN: Will hold on treatment for  hyponatremia until cause further delineated.  Will procure serum osm, urine osm, TSH, cortisol.  Trend BMP in AM.  #Other problems -COPD: wheezing present on exam, no PFTs on file, will provide duo-nebs as needed -Smoking: he would like nicotine patch, therefore - provided, continue ongoing smoking cessation counseling -Functional decline / poor living situation: patient does not have adequate home support, not enough resources for food -Providing nutritional supplements in event of micronutrient deficiency -Anemia, microcytic: obtaining iron profile to further delineate etiology, iron deficiency suspected  DVT prophylaxis: [place] hep Code Status: full code Disposition Plan: Anticipate D/C to SNF given poor living conditions Consults called: none, but anticipate PT / OT need Admission status: admit to hospital medicine  Cheri Rous, MD [facility] Page:[phone]  If 7PM-7AM, please contact night-coverage [email or web address] Password [email or web address]

## 2017-10-14 NOTE — ED Notes (Signed)
Delay in blood draw pt not in [facility]

## 2017-10-14 NOTE — ED Notes (Signed)
HEART HEALTHY ORDERED

## 2017-10-14 NOTE — Clinical Social Work Note (Signed)
Clinical Social Work Assessment  Patient Details  Name: Vincent Dunlap MRN: [id number] Date of Birth: 1940/12/13  Date of referral:  10/14/17               Reason for consult:  Facility Placement                Permission sought to share information with:  Chartered certified accountant granted to share information::  Yes, Verbal Permission Granted(Given to nurse)  Name::        Agency::     Relationship::     Contact Information:     Housing/Transportation Living arrangements for the past 2 months:  [place] of Information:  Medical Team(Pt no access to phone, info from Nurse (Pt at [facility] [place] [facility] Patient Interpreter Needed:  None Criminal Activity/Legal Involvement Pertinent to Current Situation/Hospitalization:    Significant Relationships:    Lives with:  Self Do you feel safe going back to the place where you live?  No Need for family participation in patient care:  Yes (Comment)  Care giving concerns:  Concerns expressed that pt cannot care for himself. See Nurse Lydia Guiles note for further details.    Social Worker assessment / plan:  CSW consulted for SNF placement. CSW  Completed assessment via phone with pt's nurse. Pt is not accessible by phone and no family or friends at bedside. Pt's nurse expressed strong concerns about pt returning home. Pt's nurse stated that pt has been confined to his chair. In the home, pt kept his food beside his chair and used a single space heater to warm the home. Pt is unable to ambulate. Pt was brought to the hospital by [facility]. Strong concerns expressed about pt returning home.  CSW asked nurse to have pt call CSW when he has access to a phone. CSW will continue to follow pt to see if he is moved to a room with a phone.   Employment status:    Nurse, adult PT Recommendations:  Not assessed at this time Information / Referral to community resources:  [place]  Patient/Family's Response to care:  Nurse informed CSW that pt is accepting and wanting of a SNF placement. Pt expressed concerns to nurse about his safety if he returned home.    Patient/Family's Understanding of and Emotional Response to Diagnosis, Current Treatment, and Prognosis:  No questions or further concerns expressed to CSW at this time.   Emotional Assessment Appearance:    Attitude/Demeanor/Rapport:    Affect (typically observed):    Orientation:  Oriented to Self, Oriented to Situation, Oriented to Place, Oriented to  Time Alcohol / Substance use:    Psych involvement (Current and /or in the community):  No (Comment)  Discharge Needs  Concerns to be addressed:  Lack of Support, Home Safety Concerns, Care Coordination Readmission within the last 30 days:  No Current discharge risk:  Lack of support system, Lives alone Barriers to Discharge:  No Barriers Identified   Wendelyn Breslow, LCSW 10/14/2017, 4:10 PM

## 2017-10-14 NOTE — ED Triage Notes (Addendum)
Pt to ED via [facility] from home with c/o chest pain-- pt received NTG x1 per EMS enroute , 324mg  ASA given enroute also.  Pt states that he has a headache.   Pt states he called social services, social services called 911-- pt lives alone, is nonambulatory, is unable to getup out of a chair, EMS carried pt out of house. EMS states that pt had a space heater connected to a drop cord plugged in another room, no other heat source. Pt had a jar of peanut butter and bread next to chair, no other food. Pt states that he is unable to get to the refrigerator, so all of his food sits next to him in his chair.

## 2017-10-14 NOTE — ED Provider Notes (Signed)
[facility] EMERGENCY DEPARTMENT Provider Note   CSN: [id number] Arrival date & time: 10/14/17  1526     History   Chief Complaint Chief Complaint  Patient presents with  . Chest Pain    HPI Vincent Dunlap is a 76 y.o. male.  HPI [phone] presenting with known severe ischemic cardiomyopathy with and EF 25-30% who is not a surgical candidate presenting with chest pain. Pt states his pain started a rest at approx 10am and was a tightness in his chest associated with tingling down his left arm. Denies N/V or diaphoresis. Pt is nonambulatory. He called his Education officer, museum and told her about the chest pain and she called EMS. EMS gave 324 ASA and 2 nitro which took his pain from a 7/10 to 1/10 but no endorses an occipital HA. No recent injurys. HA started after nitro. Denies fevers, cough, sputum production, SOB, neck pain.   Of note, pt lives alone and is unable to get out of his recliner due to severe arthritis and "bone pain". EMS found him in his recliner with a urinal, a loaf of bread, peanut butter and a space heater. He states that he will have BMs on himself and wait until someone can come by and clean him. He states he has no family. All of his food comes from meals on wheels. Has a Education officer, museum that checks on him.  Past Medical History:  Diagnosis Date  . CAD (coronary artery disease) 07/2016   3 vessel disease,  evaluated by Dr Servando Snare and felt to be a poor surgical candidate  . Hypertension   . Ischemic cardiomyopathy 2017   EF 30%  . Tobacco abuse     Patient Active Problem List   Diagnosis Date Noted  . Severe protein-calorie malnutrition ([place])   . UTI (urinary tract infection) 07/17/2017  . Sepsis ([place]) 07/17/2017  . Hypotension 07/17/2017  . Chest pain 07/17/2017  . Osteomyelitis of toe of right foot ([place]) 07/17/2017  . Pressure injury of skin 07/17/2017  . Acute coronary syndrome ([place]) 07/22/2016  . COPD (chronic obstructive pulmonary disease)  ([place])   . 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 Chronic systolic CHF (congestive heart failure) ([place])   . Chronic pain syndrome   . Exposure to potentially harmful entity   . Pressure ulcer 07/18/2016  . Acute respiratory failure with hypoxia ([place]) 07/17/2016  . Hypertension 07/17/2016  . Dizziness 07/17/2016  . Congestive heart failure ([place])   . CAD (coronary artery disease), native coronary artery   . NSTEMI (non-ST elevated myocardial infarction) ([place])   . At risk for falling 05/20/2014  . Thrombocytopenia ([place]) 08/30/2012    Past Surgical History:  Procedure Laterality Date  . ABDOMINAL AORTOGRAM W/LOWER EXTREMITY Right 07/26/2017   Procedure: ABDOMINAL AORTOGRAM W/LOWER EXTREMITY;  Surgeon: Serafina Mitchell, MD;  Location: [facility];  Service: Cardiovascular;  Laterality: Right;  . AMPUTATION Right 07/28/2017   Procedure: AMPUTATION RIGHT Second TOE;  Surgeon: Waynetta Sandy, MD;  Location: [facility];  Service: Vascular;  Laterality: Right;  . CARDIAC CATHETERIZATION N/A 07/19/2016   Procedure: Right/Left Heart Cath and Coronary Angiography;  Surgeon: Troy Sine, MD;  Location: [facility];  Service: Cardiovascular;  Laterality: N/A;  . INGUINAL HERNIA REPAIR Bilateral   . PERIPHERAL VASCULAR INTERVENTION Right 07/26/2017   Procedure: PERIPHERAL VASCULAR INTERVENTION;  Surgeon: Serafina Mitchell, MD;  Location: [facility];  Service: Cardiovascular;  Laterality: Right;  Home Medications    Prior to Admission medications   Medication Sig Start Date End Date Taking? Authorizing Provider  acetaminophen (TYLENOL) 325 MG tablet Take 2 tablets (650 mg total) by mouth every 4 (four) hours as needed for headache or mild pain. 07/30/17   Barton Dubois, MD  aspirin 81 MG chewable tablet Chew 1 tablet (81 mg total) by mouth daily. 07/31/17   Barton Dubois, MD  atorvastatin (LIPITOR) 80 MG tablet Take 1 tablet (80 mg total) by mouth daily at 6  PM. 07/25/16   Regalado, Belkys A, MD  bisacodyl (DULCOLAX) 10 MG suppository Place 1 suppository (10 mg total) rectally daily as needed for severe constipation. 07/30/17   Barton Dubois, MD  carvedilol (COREG) 6.25 MG tablet Take 1 tablet (6.25 mg total) by mouth 2 (two) times daily with a meal. 07/25/16   Regalado, Belkys A, MD  clopidogrel (PLAVIX) 75 MG tablet Take 1 tablet (75 mg total) by mouth daily with breakfast. 07/31/17   Barton Dubois, MD  doxycycline (VIBRA-TABS) 100 MG tablet Take 1 tablet (100 mg total) by mouth every 12 (twelve) hours. 07/30/17   Barton Dubois, MD  feeding supplement, ENSURE ENLIVE, (ENSURE ENLIVE) LIQD Take 237 mLs by mouth 2 (two) times daily between meals. 07/30/17   Barton Dubois, MD  isosorbide mononitrate (IMDUR) 30 MG 24 hr tablet Take 1 tablet (30 mg total) by mouth daily. 07/31/17   Barton Dubois, MD  lisinopril (PRINIVIL,ZESTRIL) 2.5 MG tablet Take 1 tablet (2.5 mg total) by mouth daily. 07/26/16   Regalado, Belkys A, MD  nicotine (NICODERM CQ - DOSED IN MG/24 HOURS) 14 mg/24hr patch Place 1 patch (14 mg total) onto the skin daily. 07/31/17   Barton Dubois, MD  nitroGLYCERIN (NITROSTAT) 0.4 MG SL tablet Place 1 tablet (0.4 mg total) under the tongue every 5 (five) minutes as needed for chest pain. 07/25/16   Regalado, Belkys A, MD  oxyCODONE (ROXICODONE) 5 MG immediate release tablet Take 1 tablet (5 mg total) by mouth every 8 (eight) hours as needed for severe pain. 07/30/17 07/30/18  Barton Dubois, MD  polyethylene glycol [facility] Floria Raveling) packet Take 17 g by mouth 2 (two) times daily. 07/30/17   Barton Dubois, MD    Family History Family History  Problem Relation Age of Onset  . Alzheimer's disease Mother   . Diabetes Mother        Diabetes in the maternal side of the family but not in mother  . Heart disease Neg Hx   . Cancer Neg Hx     Social History Social History   Tobacco Use  . Smoking status: Current Every Day Smoker  . Smokeless  tobacco: Former Systems developer  . Tobacco comment: 1 pack per day ages [age]  Substance Use Topics  . Alcohol use: No    Comment: Former heavy binge drinker until 2014  . Drug use: No     Allergies   Chantix [varenicline]   Review of Systems Review of Systems  Constitutional: Negative for chills and fever.  HENT: Negative for ear pain and sore throat.   Eyes: Negative for pain and visual disturbance.  Respiratory: Negative for cough and shortness of breath.   Cardiovascular: Positive for chest pain. Negative for palpitations.  Gastrointestinal: Negative for abdominal pain, diarrhea, nausea and vomiting.  Genitourinary: Negative for dysuria and hematuria.  Musculoskeletal: Negative for arthralgias, back pain and neck pain.  Skin: Negative for color change and rash.  Neurological: Positive for headaches. Negative for  syncope, facial asymmetry, weakness and numbness.  All other systems reviewed and are negative.    Physical Exam Updated Vital Signs BP 130/70   Pulse 88   Temp 98.3 F (36.8 C)   Resp 16   Ht 5\' 11"  (1.803 m)   Wt 71.2 kg (157 lb)   SpO2 99%   BMI 21.90 kg/m   Physical Exam  Constitutional: He is oriented to person, place, and time. He appears well-developed and well-nourished.  HENT:  Head: Normocephalic and atraumatic.  Eyes: Conjunctivae are normal. Pupils are equal, round, and reactive to light.  Neck: Normal range of motion. Neck supple.  Cardiovascular: Normal rate, regular rhythm and normal pulses. Exam reveals no gallop and no friction rub.  No murmur heard. Pulmonary/Chest: Effort normal and breath sounds normal. No respiratory distress. He has no decreased breath sounds. He has no rhonchi.  Abdominal: Soft. There is no tenderness.  Musculoskeletal: He exhibits no edema.  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 No erythema or drainage or ulceration at amputation site.  Neurological: He is alert and oriented to person, place, and time. He has normal strength. No cranial  nerve deficit or sensory deficit. GCS eye subscore is 4. GCS verbal subscore is 5. GCS motor subscore is 6.  Skin: Skin is warm and dry.  Psychiatric: He has a normal mood and affect.  Nursing note and vitals reviewed.    ED Treatments / Results  Labs (all labs ordered are listed, but only abnormal results are displayed) Labs Reviewed  CBC WITH DIFFERENTIAL/PLATELET - Abnormal; Notable for the following components:      Result Value   WBC 12.7 (*)    RBC 3.93 (*)    Hemoglobin 9.9 (*)    HCT 29.6 (*)    MCV 75.3 (*)    MCH 25.2 (*)    RDW 17.5 (*)    Platelets 139 (*)    Neutro Abs 7.9 (*)    Monocytes Absolute 1.2 (*)    All other components within normal limits  BASIC METABOLIC PANEL - Abnormal; Notable for the following components:   Sodium 125 (*)    Chloride 92 (*)    CO2 21 (*)    Creatinine, Ser 0.56 (*)    Calcium 8.7 (*)    All other components within normal limits  BRAIN NATRIURETIC PEPTIDE - Abnormal; Notable for the following components:   B Natriuretic Peptide 444.3 (*)    All other components within normal limits  I-STAT TROPONIN, ED  I-STAT TROPONIN, ED  I-STAT TROPONIN, ED    EKG  EKG Interpretation  Date/Time:  Friday October 14 2017 15:32:13 EST Ventricular Rate:  91 PR Interval:    QRS Duration: 105 QT Interval:  371 QTC Calculation: 457 R Axis:   75 Text Interpretation:  Sinus rhythm Abnormal R-wave progression, early transition Nonspecific repol abnormality, lateral leads Confirmed by Lacretia Leigh ([id number]) on 10/14/2017 4:41:35 PM       Radiology Dg Chest Port 1 View  Result Date: 10/14/2017 CLINICAL DATA:  Chest pain EXAM: PORTABLE CHEST 1 VIEW COMPARISON:  07/16/2017 and 03/01/2007 FINDINGS: The heart size and mediastinal contours are within normal limits. Aortic atherosclerosis without aneurysm. Pulmonary hyperinflation is noted, upper lobe predominant likely on the basis of mild emphysematous change. Crowding of lower lobe  interstitial lung markings. Tiny granuloma in the right lower lobe appears stable dating back to 2008. Mild interstitial prominence that may reflect chronic interstitial fibrosis is re- demonstrated. No alveolar consolidations. The visualized  skeletal structures are unremarkable. IMPRESSION: 1. 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 2. Bilateral upper lobe predominant hyperinflation of the lungs. 3. Aortic atherosclerosis. 4. No active pulmonary disease. Electronically Signed   By: Ashley Royalty M.D.   On: 10/14/2017 16:28    Procedures Procedures (including critical care time)  Medications Ordered in ED Medications - No data to display   Initial Impression / Assessment and Plan / ED Course  I have reviewed the triage vital signs and the nursing notes.  Pertinent labs & imaging results that were available during my care of the patient were reviewed by me and considered in my medical decision making (see chart for details).     75 year old male with a known history of severe ischemic cardiomyopathy who is not a surgical candidate presenting with an episode of chest pain at rest.  He is afebrile and he was stable.  Chest pain resolved after nitroglycerin given by EMS.  He was also given 324 aspirin by EMS.  EKG with ST depressions in V5 and V6 that appear similar to prior EKGs.  No obvious acute ischemic changes.  Denies any shortness of breath, nausea, diaphoresis.  States he has a mild headache after receiving the nitroglycerin.  Nonfocal neuro exam.  Otherwise patient well-appearing however patient in unsafe home environment as he is unable to adequately care for himself.  Concern for ACS or worsening heart failure.  No obvious signs of fluid overload on exam. Chest x-ray with changes of chronic interstitial lung disease, however no wide mediastinum, pneumothorax, pneumonia.  Labs notable for mild hyponatremia.  Initial troponin  negative.  Due to his severe cardiac disease and concerning episode of chest pain and poor living situation, patient will be admitted to the hospital service for further management and ACS rule out.   Final Clinical Impressions(s) / ED Diagnoses   Final diagnoses:  Chest pain    ED Discharge Orders    None       Taneal Sonntag Mali, MD 10/14/17 2220

## 2017-10-15 ENCOUNTER — Encounter (HOSPITAL_COMMUNITY): Payer: Self-pay | Admitting: *Deleted

## 2017-10-15 DIAGNOSIS — E871 Hypo-osmolality and hyponatremia: Secondary | ICD-10-CM

## 2017-10-15 DIAGNOSIS — J449 Chronic obstructive pulmonary disease, unspecified: Secondary | ICD-10-CM | POA: Diagnosis not present

## 2017-10-15 DIAGNOSIS — I5022 Chronic systolic (congestive) heart failure: Secondary | ICD-10-CM | POA: Diagnosis not present

## 2017-10-15 DIAGNOSIS — R079 Chest pain, unspecified: Secondary | ICD-10-CM | POA: Diagnosis not present

## 2017-10-15 LAB — BASIC METABOLIC PANEL
ANION GAP: 9 (ref 5–15)
ANION GAP: 9 (ref 5–15)
Anion gap: 10 (ref 5–15)
BUN: 14 mg/dL (ref 6–20)
BUN: 13 mg/dL (ref 6–20)
BUN: 15 mg/dL (ref 6–20)
CALCIUM: 8.3 mg/dL — AB (ref 8.9–10.3)
CALCIUM: 8.3 mg/dL — AB (ref 8.9–10.3)
CHLORIDE: 92 mmol/L — AB (ref 101–111)
CO2: 22 mmol/L (ref 22–32)
CO2: 24 mmol/L (ref 22–32)
CO2: 23 mmol/L (ref 22–32)
CREATININE: 0.65 mg/dL (ref 0.61–1.24)
Calcium: 8.6 mg/dL — ABNORMAL LOW (ref 8.9–10.3)
Chloride: 90 mmol/L — ABNORMAL LOW (ref 101–111)
Chloride: 93 mmol/L — ABNORMAL LOW (ref 101–111)
Creatinine, Ser: 0.64 mg/dL (ref 0.61–1.24)
Creatinine, Ser: 0.75 mg/dL (ref 0.61–1.24)
GFR calc Af Amer: >60 mL/min (ref >60)
GFR calc non Af Amer: >60 mL/min (ref >60)
GLUCOSE: 118 mg/dL — AB (ref 65–99)
Glucose, Bld: 87 mg/dL (ref 65–99)
Glucose, Bld: 95 mg/dL (ref 65–99)
POTASSIUM: 4.5 mmol/L (ref 3.5–5.1)
POTASSIUM: 4 mmol/L (ref 3.5–5.1)
Potassium: 3.8 mmol/L (ref 3.5–5.1)
SODIUM: 123 mmol/L — AB (ref 135–145)
SODIUM: 125 mmol/L — AB (ref 135–145)
Sodium: 124 mmol/L — ABNORMAL LOW (ref 135–145)

## 2017-10-15 LAB — CREATININE, URINE, RANDOM: Creatinine, Urine: 38.43 mg/dL

## 2017-10-15 LAB — TROPONIN I
Troponin I: <0.03 ng/mL (ref <0.03)
Troponin I: <0.03 ng/mL (ref <0.03)

## 2017-10-15 LAB — CBC
HEMATOCRIT: 27.9 % — AB (ref 39.0–52.0)
Hemoglobin: 9.5 g/dL — ABNORMAL LOW (ref 13.0–17.0)
MCH: 25.3 pg — AB (ref 26.0–34.0)
MCHC: 34.1 g/dL (ref 30.0–36.0)
MCV: 74.2 fL — AB (ref 78.0–100.0)
PLATELETS: 136 10*3/uL — AB (ref 150–400)
RBC: 3.76 MIL/uL — ABNORMAL LOW (ref 4.22–5.81)
RDW: 16.9 % — AB (ref 11.5–15.5)
WBC: 8.5 10*3/uL (ref 4.0–10.5)

## 2017-10-15 LAB — SODIUM, URINE, RANDOM: Sodium, Ur: 42 mmol/L

## 2017-10-15 LAB — OSMOLALITY, URINE: OSMOLALITY UR: 328 mosm/kg (ref 300–900)

## 2017-10-15 LAB — CORTISOL: Cortisol, Plasma: 6.2 ug/dL

## 2017-10-15 LAB — IRON AND TIBC
IRON: 23 ug/dL — AB (ref 45–182)
Saturation Ratios: 7 % — ABNORMAL LOW (ref 17.9–39.5)
TIBC: 309 ug/dL (ref 250–450)
UIBC: 286 ug/dL

## 2017-10-15 LAB — OSMOLALITY: Osmolality: 269 mOsm/kg — ABNORMAL LOW (ref 275–295)

## 2017-10-15 LAB — FERRITIN: FERRITIN: 28 ng/mL (ref 24–336)

## 2017-10-15 LAB — TSH: TSH: 2.644 u[IU]/mL (ref 0.350–4.500)

## 2017-10-15 MED ORDER — ASPIRIN 81 MG PO CHEW
81.0000 mg | CHEWABLE_TABLET | Freq: Every day | ORAL | Status: DC
  Administered 2017-10-15 – 2017-10-20 (×6): 81 mg via ORAL
  Filled 2017-10-15 (×6): qty 1

## 2017-10-15 MED ORDER — NICOTINE 14 MG/24HR TD PT24
14.0000 mg | MEDICATED_PATCH | Freq: Every day | TRANSDERMAL | Status: DC
Start: 2017-10-15 — End: 2017-10-20
  Administered 2017-10-15 – 2017-10-20 (×7): 14 mg via TRANSDERMAL
  Filled 2017-10-15 (×7): qty 1

## 2017-10-15 MED ORDER — ISOSORBIDE MONONITRATE ER 30 MG PO TB24
30.0000 mg | ORAL_TABLET | Freq: Every day | ORAL | Status: DC
  Administered 2017-10-15 – 2017-10-20 (×6): 30 mg via ORAL
  Filled 2017-10-15 (×6): qty 1

## 2017-10-15 MED ORDER — HEPARIN SODIUM (PORCINE) 5000 UNIT/ML IJ SOLN
5000.0000 [IU] | Freq: Three times a day (TID) | INTRAMUSCULAR | Status: DC
  Administered 2017-10-15 – 2017-10-19 (×14): 5000 [IU] via SUBCUTANEOUS
  Filled 2017-10-15 (×14): qty 1

## 2017-10-15 MED ORDER — IPRATROPIUM-ALBUTEROL 0.5-2.5 (3) MG/3ML IN SOLN: 3.0000 mL | Freq: Four times a day (QID) | RESPIRATORY_TRACT | Status: DC | PRN

## 2017-10-15 MED ORDER — ATORVASTATIN CALCIUM 80 MG PO TABS
80.0000 mg | ORAL_TABLET | Freq: Every day | ORAL | Status: DC
  Administered 2017-10-15 – 2017-10-19 (×5): 80 mg via ORAL
  Filled 2017-10-15 (×5): qty 1

## 2017-10-15 MED ORDER — ADULT MULTIVITAMIN W/MINERALS CH
1.0000 | ORAL_TABLET | Freq: Every day | ORAL | Status: DC
  Administered 2017-10-15 – 2017-10-20 (×6): 1 via ORAL
  Filled 2017-10-15 (×6): qty 1

## 2017-10-15 MED ORDER — SODIUM CHLORIDE 1 G PO TABS
1.0000 g | ORAL_TABLET | Freq: Two times a day (BID) | ORAL | Status: DC
  Administered 2017-10-15 – 2017-10-18 (×7): 1 g via ORAL
  Filled 2017-10-15 (×8): qty 1

## 2017-10-15 MED ORDER — INFLUENZA VAC SPLIT HIGH-DOSE 0.5 ML IM SUSY: 0.5000 mL | PREFILLED_SYRINGE | INTRAMUSCULAR | Status: DC

## 2017-10-15 MED ORDER — VITAMIN B-1 100 MG PO TABS
100.0000 mg | ORAL_TABLET | Freq: Every day | ORAL | Status: DC
  Administered 2017-10-15 – 2017-10-20 (×6): 100 mg via ORAL
  Filled 2017-10-15 (×4): qty 1

## 2017-10-15 MED ORDER — CARVEDILOL 6.25 MG PO TABS
6.2500 mg | ORAL_TABLET | Freq: Two times a day (BID) | ORAL | Status: DC
  Administered 2017-10-15 – 2017-10-18 (×8): 6.25 mg via ORAL
  Filled 2017-10-15 (×9): qty 1

## 2017-10-15 MED ORDER — CLOPIDOGREL BISULFATE 75 MG PO TABS
75.0000 mg | ORAL_TABLET | Freq: Every day | ORAL | Status: DC
  Administered 2017-10-15 – 2017-10-18 (×4): 75 mg via ORAL
  Filled 2017-10-15 (×5): qty 1

## 2017-10-15 MED ORDER — INFLUENZA VAC SPLIT HIGH-DOSE 0.5 ML IM SUSY
0.5000 mL | PREFILLED_SYRINGE | INTRAMUSCULAR | Status: AC
  Administered 2017-10-16: 0.5 mL via INTRAMUSCULAR
  Filled 2017-10-15: qty 0.5

## 2017-10-15 MED ORDER — FOLIC ACID 1 MG PO TABS
1.0000 mg | ORAL_TABLET | Freq: Every day | ORAL | Status: DC
  Administered 2017-10-15 – 2017-10-20 (×6): 1 mg via ORAL
  Filled 2017-10-15 (×6): qty 1

## 2017-10-15 MED ORDER — ACETAMINOPHEN 325 MG PO TABS
650.0000 mg | ORAL_TABLET | ORAL | Status: DC | PRN
  Administered 2017-10-15: 650 mg via ORAL
  Filled 2017-10-15: qty 2

## 2017-10-15 MED ORDER — LISINOPRIL 2.5 MG PO TABS: 2.5000 mg | ORAL_TABLET | Freq: Every day | ORAL | Status: DC

## 2017-10-15 MED ORDER — PNEUMOCOCCAL VAC POLYVALENT 25 MCG/0.5ML IJ INJ
0.5000 mL | INJECTION | INTRAMUSCULAR | Status: AC
  Administered 2017-10-16: 0.5 mL via INTRAMUSCULAR
  Filled 2017-10-15: qty 0.5

## 2017-10-15 MED ORDER — NITROGLYCERIN 0.4 MG SL SUBL: 0.4000 mg | SUBLINGUAL_TABLET | SUBLINGUAL | Status: DC | PRN

## 2017-10-15 MED ORDER — SODIUM CHLORIDE 0.9% FLUSH
3.0000 mL | Freq: Two times a day (BID) | INTRAVENOUS | Status: DC
  Administered 2017-10-15 – 2017-10-20 (×11): 3 mL via INTRAVENOUS

## 2017-10-15 NOTE — NC FL2 (Signed)
[place] MEDICAID FL2 LEVEL OF CARE SCREENING TOOL     IDENTIFICATION  Patient Name: Vincent Dunlap Birthdate: 1941/02/05 Sex: male Admission Date (Current Location): 10/14/2017  [facility] and [place] Number:  Herbalist and Address:  [facility], [place] [address] [place], [place] [address]      Provider Number: [id number]  Attending Physician Name and Address:  Elmarie Shiley, MD  Relative Name and Phone Number:       Current Level of Care: Hospital Recommended Level of Care: [place] Prior Approval Number:    Date Approved/Denied:   PASRR Number: [id number]  Discharge Plan: SNF    Current Diagnoses: Patient Active Problem List   Diagnosis Date Noted  . Severe protein-calorie malnutrition ([place])   . UTI (urinary tract infection) 07/17/2017  . Sepsis ([place]) 07/17/2017  . Hypotension 07/17/2017  . Chest pain 07/17/2017  . Osteomyelitis of toe of right foot ([place]) 07/17/2017  . Pressure injury of skin 07/17/2017  . Acute coronary syndrome ([place]) 07/22/2016  . COPD (chronic obstructive pulmonary disease) ([place])   . 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 Chronic systolic CHF (congestive heart failure) ([place])   . Chronic pain syndrome   . Exposure to potentially harmful entity   . Pressure ulcer 07/18/2016  . Acute respiratory failure with hypoxia ([place]) 07/17/2016  . Hypertension 07/17/2016  . Dizziness 07/17/2016  . Congestive heart failure ([place])   . CAD (coronary artery disease), native coronary artery   . NSTEMI (non-ST elevated myocardial infarction) ([place])   . At risk for falling 05/20/2014  . Thrombocytopenia ([place]) 08/30/2012    Orientation RESPIRATION BLADDER Height & Weight     Self, Time, Situation, Place  Normal Continent Weight: 156 lb 8.4 oz (71 kg) Height:  5\' 11"  (180.3 cm)  BEHAVIORAL SYMPTOMS/MOOD NEUROLOGICAL BOWEL NUTRITION STATUS  (None) (None) Continent Diet(Regular. 1 L water  restriction.)  AMBULATORY STATUS COMMUNICATION OF NEEDS Skin   Extensive Assist Verbally Normal                       Personal Care Assistance Level of Assistance  Bathing, Feeding, Dressing Bathing Assistance: Limited assistance Feeding assistance: Limited assistance Dressing Assistance: Limited assistance     Functional Limitations Info  Sight, Hearing, Speech Sight Info: Adequate Hearing Info: Adequate Speech Info: Adequate    SPECIAL CARE FACTORS FREQUENCY  PT (By licensed PT)     PT Frequency: 5 x week              Contractures Contractures Info: Not present    Additional Factors Info  Code Status, Allergies Code Status Info: Full Allergies Info: Chantix (Varenicline)           Current Medications (10/15/2017):  This is the current hospital active medication list Current Facility-Administered Medications  Medication Dose Route Frequency Provider Last Rate Last Dose  . acetaminophen (TYLENOL) tablet 650 mg  650 mg Oral Q4H PRN Vilma Prader, MD   650 mg at 10/15/17 0205  . aspirin chewable tablet 81 mg  81 mg Oral Daily Vilma Prader, MD   81 mg at 10/15/17 1047  . atorvastatin (LIPITOR) tablet 80 mg  80 mg Oral q1800 Vilma Prader, MD      . carvedilol (COREG) tablet 6.25 mg  6.25 mg Oral BID WC Vilma Prader, MD   6.25 mg at 10/15/17 0901  . clopidogrel (PLAVIX) tablet 75 mg  75  mg Oral Q breakfast Vilma Prader, MD   75 mg at 10/15/17 5449  . folic acid (FOLVITE) tablet 1 mg  1 mg Oral Daily Vilma Prader, MD   1 mg at 10/15/17 1047  . heparin injection 5,000 Units  5,000 Units Subcutaneous Q8H Vilma Prader, MD      . Influenza vac split quadrivalent PF (FLUZONE HIGH-DOSE) injection 0.5 mL  0.5 mL Intramuscular Tomorrow-1000 Vilma Prader, MD      . ipratropium-albuterol (DUONEB) 0.5-2.5 (3) MG/3ML nebulizer solution 3 mL  3 mL Nebulization Q6H PRN Vilma Prader, MD      . isosorbide mononitrate (IMDUR) 24 hr  tablet 30 mg  30 mg Oral Daily Vilma Prader, MD   30 mg at 10/15/17 1047  . multivitamin with minerals tablet 1 tablet  1 tablet Oral Daily Vilma Prader, MD   1 tablet at 10/15/17 1047  . nicotine (NICODERM CQ - dosed in mg/24 hours) patch 14 mg  14 mg Transdermal Daily Vilma Prader, MD   14 mg at 10/15/17 1049  . nitroGLYCERIN (NITROSTAT) SL tablet 0.4 mg  0.4 mg Sublingual Q5 min PRN Vilma Prader, MD      . sodium chloride flush (NS) 0.9 % injection 3 mL  3 mL Intravenous Q12H Vilma Prader, MD   3 mL at 10/15/17 0046  . thiamine (VITAMIN B-1) tablet 100 mg  100 mg Oral Daily Vilma Prader, MD   100 mg at 10/15/17 1047     Discharge Medications: Please see discharge summary for a list of discharge medications.  Relevant Imaging Results:  Relevant Lab Results:   Additional Information SS#: [id number]. Might be interested in ALF once discharged.  Candie Chroman, LCSW

## 2017-10-15 NOTE — Care Management Note (Signed)
Case Management Note  Patient Details  Name: Vincent Dunlap MRN: [id number] Date of Birth: [birth year]  Subjective/Objective:   Chest Pain               Action/Plan: 10/15/2017- Patient known to me from previous admission; CM will continue to follow for progression of care; B Pennie Rushing  Action/Plan: 07/29/2017 - Patient lives at home, he states that he has several people in the home that can help him if needed; PCP: Thressa Sheller, MD; has private insurance with [facility] with prescription drug coverage; [facility]; patient is refusing SNF placement at this time and is requesting to go home at discharge; [facility] choice offered, pt chose [facility]); Tim with [facility] called for arrangements.  Expected Discharge Date:  10/19/17               Expected Discharge Plan:  [place]  In-House Referral:   [facility]  Discharge planning Services  CM Consult  Status of Service:  In process, will continue to follow  Sherrilyn Rist [phone] 10/15/2017, 9:53 AM

## 2017-10-15 NOTE — Progress Notes (Signed)
Pt is alert and oriented. 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 Pt. Stated, "it has been there for a long time and it bothers him once in awhile". It appears like unstageable ulcer at the right elbow, no clear wound base is noted.

## 2017-10-15 NOTE — Clinical Social Work Placement (Signed)
   CLINICAL SOCIAL WORK PLACEMENT  NOTE  Date:  10/15/2017  Patient Details  Name: Vincent Dunlap MRN: [id number] Date of Birth: 1941-07-20  Clinical Social Work is seeking post-discharge placement for this patient at the [facility] level of care (*CSW will initial, date and re-position this form in  chart as items are completed):  Yes   Patient/family provided with [facility] Work Department's list of facilities offering this level of care within the geographic area requested by the patient (or if unable, by the patient's family).  Yes   Patient/family informed of their freedom to choose among providers that offer the needed level of care, that participate in Medicare, Medicaid or managed care program needed by the patient, have an available bed and are willing to accept the patient.  Yes   Patient/family informed of [facility]'s ownership interest in [facility] and [facility], as well as of the fact that they are under no obligation to receive care at these facilities.  PASRR submitted to EDS on 10/15/17     PASRR number received on       Existing PASRR number confirmed on 10/15/17     FL2 transmitted to all facilities in geographic area requested by pt/family on 10/15/17     FL2 transmitted to all facilities within larger geographic area on       Patient informed that his/her managed care company has contracts with or will negotiate with certain facilities, including the following:            Patient/family informed of bed offers received.  Patient chooses bed at       Physician recommends and patient chooses bed at      Patient to be transferred to   on  .  Patient to be transferred to facility by       Patient family notified on   of transfer.  Name of family member notified:        PHYSICIAN Please sign FL2     Additional Comment:    _______________________________________________ Candie Chroman, LCSW 10/15/2017, 11:20  AM

## 2017-10-15 NOTE — Clinical Social Work Note (Signed)
Patient is agreeable to SNF placement. First preference is [facility]. CSW sent referral and left message for [facility] admissions coordinator to notify her of preference. CSW provided SNF list as well in case [facility] cannot take him.  Vincent Dunlap, [place]

## 2017-10-15 NOTE — Progress Notes (Signed)
PROGRESS NOTE    Vincent Dunlap  MHD:[id number] DOB: [birth year] DOA: 10/14/2017 PCP: Thressa Sheller, MD   Brief Narrative:This 76 year old man with coronary artery disease who has triple-vessel disease deemed not a candidate for CABG due to comorbidities and frailty, history of heart failure with most recent echo revealing improvement of ejection fraction to 55%, current every day smoker, COPD, peripheral arterial disease, and poor social living situation presenting to the emergency department with chest pain.  Patient reports living [place], he is a retired Building control surveyor, he reports needing help with his ADLs. His roommate moved out 4-5 days prior to admission and he has been a difficult position. He receives one meal a day for Meals on Wheels and otherwise snacks or whatever is available. He reports adequate by mouth intake as far as fluids ago. He reports drinking alcohol at times up to 6-7 beers a day, last consumed that amount 2 weeks ago according to his report. He did having difficulty getting out of his chair, citing that when he does he becomes dizzy and lightheaded. On day of admission he developed central chest pain that lasts for 30 minutes, described as sharp, exacerbated by emotional stress. He took a sublingual nitroglycerin and was taken to the hospital for further evaluation.  Reports some wheezing, no fevers, chills, productive cough.  Assessment & Plan:   Active Problems:   Chest pain  Chest pain in setting of CAD, prior HF (now preserved EF), and peripheral arterial disease Chest pain free.  Continue with Imdur, aspirin and plavix.  Troponin negative.   Hyponatremia; suspect SIADH, urine sodium more than 40 and urine osmolality more than 300/  Water restriction.  Repeat B met this afternoon. If no improvement will add sodium tablet.  Cortisol 6.2. TSH 2.6, urine osmolality 328.  COPD: nebulizer PRN  -Functional decline / poor living situation: needs PT, ot  SNF  -Anemia, microcytic: iron deficiency. Start iron supplement  cehck B 12.   DVT prophylaxis: Heparin  Code Status: full code.  Family Communication: care discussed with patient.  Disposition Plan: (will need SNF  Consultants:  none  Procedures: none   Antimicrobials:  none  Subjective: He is alert, oriented. Denies chest pain. He report chest pain for long time   Objective: Vitals:   10/14/17 2000 10/14/17 2358 10/15/17 0538 10/15/17 1255  BP: 130/70 121/67 (!) 107/57 (!) 102/53  Pulse: 88 91  79  Resp: _0 Temp: 98.3 F (36.8 C) 98.2 F (36.8 C) 97.9 F (36.6 C) 97.8 F (36.6 C)  TempSrc:  Oral Oral Oral  SpO2: 99% 99% 97% 99%  Weight:   71 kg (156 lb 8.4 oz)   Height:        Intake/Output Summary (Last 24 hours) at 10/15/2017 1316 Last data filed at 10/15/2017 0900 Gross per 24 hour  Intake 360 ml  Output 725 ml  Net -365 ml   Filed Weights   10/14/17 1545 10/14/17 1949 10/15/17 0538  Weight: 71.2 kg (157 lb) 71.2 kg (157 lb) 71 kg (156 lb 8.4 oz)    Examination:  General exam: Appears calm and comfortable  Respiratory system: Clear to auscultation. Respiratory effort normal. Cardiovascular system: S1 & S2 heard, RRR. No JVD, murmurs, rubs, gallops or clicks. No pedal edema. Gastrointestinal system: Abdomen is nondistended, soft and nontender. No organomegaly or masses felt. Normal bowel sounds heard. Central nervous system: Alert and oriented. No focal neurological deficits. Extremities: Symmetric 5 x 5 power. Skin: No  rashes, lesions or ulcers   Data Reviewed: I have personally reviewed following labs and imaging studies  CBC: Recent Labs  Lab 10/14/17 1845 10/15/17 0524  WBC 12.7* 8.5  NEUTROABS 7.9*  --   HGB 9.9* 9.5*  HCT 29.6* 27.9*  MCV 75.3* 74.2*  PLT 139* 915*   Basic Metabolic Panel: Recent Labs  Lab 10/14/17 1845 10/15/17 0524  NA 125* 124*  K 5.1 4.5  CL 92* 92*  CO2 21* 22  GLUCOSE 87 87  BUN 13 14   CREATININE 0.56* 0.65  CALCIUM 8.7* 8.6*   GFR: Estimated Creatinine Clearance: 78.9 mL/min (by C-G formula based on SCr of 0.65 mg/dL). Liver Function Tests: No results for input(s): AST, ALT, ALKPHOS, BILITOT, PROT, ALBUMIN in the last 168 hours. No results for input(s): LIPASE, AMYLASE in the last 168 hours. No results for input(s): AMMONIA in the last 168 hours. Coagulation Profile: No results for input(s): INR, PROTIME in the last 168 hours. Cardiac Enzymes: Recent Labs  Lab 10/15/17 0048 10/15/17 0524 10/15/17 1113  TROPONINI <0.03 <0.03 <0.03   BNP (last 3 results) No results for input(s): PROBNP in the last 8760 hours. HbA1C: No results for input(s): HGBA1C in the last 72 hours. CBG: No results for input(s): GLUCAP in the last 168 hours. Lipid Profile: No results for input(s): CHOL, HDL, LDLCALC, TRIG, CHOLHDL, LDLDIRECT in the last 72 hours. Thyroid Function Tests: Recent Labs    10/15/17 0524  TSH 2.644   Anemia Panel: Recent Labs    10/15/17 0524  FERRITIN 28  TIBC 309  IRON 23*   Sepsis Labs: No results for input(s): PROCALCITON, LATICACIDVEN in the last 168 hours.  No results found for this or any previous visit (from the past 240 hour(s)).       Radiology Studies: Dg Chest Port 1 View  Result Date: 10/14/2017 CLINICAL DATA:  Chest pain EXAM: PORTABLE CHEST 1 VIEW COMPARISON:  07/16/2017 and 03/01/2007 FINDINGS: The heart size and mediastinal contours are within normal limits. Aortic atherosclerosis without aneurysm. Pulmonary hyperinflation is noted, upper lobe predominant likely on the basis of mild emphysematous change. Crowding of lower lobe interstitial lung markings. Tiny granuloma in the right lower lobe appears stable dating back to 2008. Mild interstitial prominence that may reflect chronic interstitial fibrosis is re- demonstrated. No alveolar consolidations. The visualized skeletal structures are unremarkable. IMPRESSION: 1. Changes of  chronic interstitial lung disease appear stable that may reflect areas of pulmonary fibrosis. 2. Bilateral upper lobe predominant hyperinflation of the lungs. 3. Aortic atherosclerosis. 4. No active pulmonary disease. Electronically Signed   By: Ashley Royalty M.D.   On: 10/14/2017 16:28        Scheduled Meds: . aspirin  81 mg Oral Daily  . atorvastatin  80 mg Oral q1800  . carvedilol  6.25 mg Oral BID WC  . clopidogrel  75 mg Oral Q breakfast  . folic acid  1 mg Oral Daily  . heparin  5,000 Units Subcutaneous Q8H  . Influenza vac split quadrivalent PF  0.5 mL Intramuscular Tomorrow-1000  . isosorbide mononitrate  30 mg Oral Daily  . multivitamin with minerals  1 tablet Oral Daily  . nicotine  14 mg Transdermal Daily  . [START ON 10/16/2017] pneumococcal 23 valent vaccine  0.5 mL Intramuscular Tomorrow-1000  . sodium chloride flush  3 mL Intravenous Q12H  . thiamine  100 mg Oral Daily   Continuous Infusions:   LOS: 1 day    Time spent: 35  minuets.     Elmarie Shiley, MD [facility] Pager [phone]  If 7PM-7AM, please contact night-coverage [email or web address] Password [email or web address] 10/15/2017, 1:16 PM

## 2017-10-15 NOTE — Consult Note (Signed)
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 Also with hard, rock-like deposits beneath the skin on the right forearm, hands. Patient states he has had this condition for "years". Wound type: infectious vs autoimmune (gout) (arthritis) Pressure Injury POA: N/A Measurement: 2cm x 2.5cm with depth unable to be determine Wound bed: white, rock-like substance prohibits assessment of wound depth. Drainage (amount, consistency, odor) None Periwound:intact, dry  Dressing procedure/placement/frequency: To prevent infection, we will pain the open lesion with a betadine swabstick once daily and allow to air-dry.  No dressing needed. If you desire a definitive diagnosis, consultation with orthopedics if suggested. [facility] nursing team will not follow, but will remain available to this patient, the nursing and medical teams.  Please re-consult if needed. Thanks, Maudie Flakes, MSN, RN, [place], Arther Abbott  Pager# [phone]

## 2017-10-16 DIAGNOSIS — E871 Hypo-osmolality and hyponatremia: Secondary | ICD-10-CM

## 2017-10-16 DIAGNOSIS — I5022 Chronic systolic (congestive) heart failure: Secondary | ICD-10-CM

## 2017-10-16 LAB — BASIC METABOLIC PANEL
ANION GAP: 8 (ref 5–15)
ANION GAP: 7 (ref 5–15)
BUN: 10 mg/dL (ref 6–20)
BUN: 13 mg/dL (ref 6–20)
CHLORIDE: 95 mmol/L — AB (ref 101–111)
CHLORIDE: 93 mmol/L — AB (ref 101–111)
CO2: 23 mmol/L (ref 22–32)
CO2: 23 mmol/L (ref 22–32)
CREATININE: 0.58 mg/dL — AB (ref 0.61–1.24)
Calcium: 8.4 mg/dL — ABNORMAL LOW (ref 8.9–10.3)
Calcium: 8.1 mg/dL — ABNORMAL LOW (ref 8.9–10.3)
Creatinine, Ser: 0.6 mg/dL — ABNORMAL LOW (ref 0.61–1.24)
GFR calc non Af Amer: >60 mL/min (ref >60)
GFR calc non Af Amer: >60 mL/min (ref >60)
Glucose, Bld: 94 mg/dL (ref 65–99)
Glucose, Bld: 112 mg/dL — ABNORMAL HIGH (ref 65–99)
POTASSIUM: 3.9 mmol/L (ref 3.5–5.1)
POTASSIUM: 3.9 mmol/L (ref 3.5–5.1)
SODIUM: 123 mmol/L — AB (ref 135–145)
Sodium: 126 mmol/L — ABNORMAL LOW (ref 135–145)

## 2017-10-16 LAB — CBC
HEMATOCRIT: 27.4 % — AB (ref 39.0–52.0)
HEMOGLOBIN: 9.3 g/dL — AB (ref 13.0–17.0)
MCH: 25 pg — ABNORMAL LOW (ref 26.0–34.0)
MCHC: 33.9 g/dL (ref 30.0–36.0)
MCV: 73.7 fL — ABNORMAL LOW (ref 78.0–100.0)
Platelets: 201 10*3/uL (ref 150–400)
RBC: 3.72 MIL/uL — AB (ref 4.22–5.81)
RDW: 17 % — ABNORMAL HIGH (ref 11.5–15.5)
WBC: 9.6 10*3/uL (ref 4.0–10.5)

## 2017-10-16 LAB — VITAMIN B12: VITAMIN B 12: 128 pg/mL — AB (ref 180–914)

## 2017-10-16 MED ORDER — CYANOCOBALAMIN 1000 MCG/ML IJ SOLN
1000.0000 ug | Freq: Once | INTRAMUSCULAR | Status: AC
  Administered 2017-10-16: 1000 ug via INTRAMUSCULAR
  Filled 2017-10-16: qty 1

## 2017-10-16 MED ORDER — OXYCODONE HCL 5 MG PO TABS
5.0000 mg | ORAL_TABLET | Freq: Three times a day (TID) | ORAL | Status: DC | PRN
  Administered 2017-10-16 – 2017-10-18 (×3): 5 mg via ORAL
  Filled 2017-10-16 (×3): qty 1

## 2017-10-16 MED ORDER — FERROUS SULFATE 325 (65 FE) MG PO TABS
325.0000 mg | ORAL_TABLET | Freq: Two times a day (BID) | ORAL | Status: DC
  Administered 2017-10-16 – 2017-10-18 (×5): 325 mg via ORAL
  Filled 2017-10-16 (×5): qty 1

## 2017-10-16 MED ORDER — AMITRIPTYLINE HCL 25 MG PO TABS
25.0000 mg | ORAL_TABLET | Freq: Three times a day (TID) | ORAL | Status: DC
  Administered 2017-10-16 – 2017-10-17 (×4): 25 mg via ORAL
  Filled 2017-10-16 (×6): qty 1

## 2017-10-16 MED ORDER — VITAMIN B-12 100 MCG PO TABS
100.0000 ug | ORAL_TABLET | Freq: Every day | ORAL | Status: DC
  Administered 2017-10-17 – 2017-10-20 (×4): 100 ug via ORAL
  Filled 2017-10-16 (×5): qty 1

## 2017-10-16 NOTE — Progress Notes (Signed)
PROGRESS NOTE    Vincent Dunlap  HTD:[id number] DOB: 1941/04/20 DOA: 10/14/2017 PCP: Thressa Sheller, MD   Brief Narrative:This 76 year old man with coronary artery disease who has triple-vessel disease deemed not a candidate for CABG due to comorbidities and frailty, history of heart failure with most recent echo revealing improvement of ejection fraction to 55%, current every day smoker, COPD, peripheral arterial disease, and poor social living situation presenting to the emergency department with chest pain.  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 His roommate moved out 4-5 days prior to admission and he has been a difficult position. He receives one meal a day for Meals on Wheels and otherwise snacks or whatever is available. He reports adequate by mouth intake as far as fluids ago. He reports drinking alcohol at times up to 6-7 beers a day, last consumed that amount 2 weeks ago according to his report. He did having difficulty getting out of his chair, citing that when he does he becomes dizzy and lightheaded. On day of admission he developed central chest pain that lasts for 30 minutes, described as sharp, exacerbated by emotional stress. He took a sublingual nitroglycerin and was taken to the hospital for further evaluation.  Reports some wheezing, no fevers, chills, productive cough.  Assessment & Plan:   Active Problems:   CAD (coronary artery disease), native coronary artery   Chronic systolic CHF (congestive heart failure) (HCC)   COPD (chronic obstructive pulmonary disease) (HCC)   Chest pain   Hyponatremia  Chest pain in setting of CAD, prior HF (now preserved EF), and peripheral arterial disease Chest pain free.  Continue with Imdur, aspirin and plavix.  Troponin negative.   Hyponatremia; suspect SIADH, urine sodium more than 40 and urine osmolality more than 300/  Water restriction.  Cortisol 6.2. TSH 2.6, urine  osmolality 328. Continue with sodium tablet. Sodium improved, today at 126.  Repeat B-met this afternoon.  Sodium corrected 4 meq in last 24 hour.   COPD: nebulizer PRN  -Functional decline / poor living situation: needs PT, ot SNF B 12 low, start supplement.  Continue with thiamine and folic acid.   -Anemia, microcytic: iron deficiency. Started  iron supplement  B 12 low, start injection.   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  Started B 12 supplements.   DVT prophylaxis: Heparin  Code Status: full code.  Family Communication: care discussed with patient.  Disposition Plan: (will need SNF  Consultants:  none  Procedures: none   Antimicrobials:  none  Subjective: He is feeling ok, denies chest pain. Does complaints of neuropathy.   Objective: Vitals:   10/15/17 0538 10/15/17 1255 10/15/17 1950 10/16/17 0449  BP: (!) 107/57 (!) 102/53 (!) 96/50 113/66  Pulse:  79 92 90  Resp: 18 20 18 18   Temp: 97.9 F (36.6 C) 97.8 F (36.6 C) 98.1 F (36.7 C) 98 F (36.7 C)  TempSrc: Oral Oral Oral Oral  SpO2: 97% 99% 98% 96%  Weight: 71 kg (156 lb 8.4 oz)   71.6 kg (157 lb 13.6 oz)  Height:        Intake/Output Summary (Last 24 hours) at 10/16/2017 1153 Last data filed at 10/16/2017 0600 Gross per 24 hour  Intake 720 ml  Output 2050 ml  Net -1330 ml   Filed Weights   10/14/17 1949 10/15/17 0538 10/16/17 0449  Weight: 71.2 kg (157 lb) 71 kg (156 lb 8.4 oz) 71.6 kg (157  lb 13.6 oz)    Examination:  General exam: NAD Respiratory system: CTA, normal respiratory effort.  Cardiovascular system: S 1, S 2 RRR Gastrointestinal system: BS present, soft, nt Central nervous system: Alert,  Extremities: Symmetric power.     Data Reviewed: I have personally reviewed following labs and imaging studies  CBC: Recent Labs  Lab 10/14/17 1845 10/15/17 0524 10/16/17 0513  WBC 12.7* 8.5 9.6  NEUTROABS 7.9*  --   --   HGB 9.9* 9.5* 9.3*  HCT 29.6* 27.9* 27.4*    MCV 75.3* 74.2* 73.7*  PLT 139* 136* 623   Basic Metabolic Panel: Recent Labs  Lab 10/14/17 1845 10/15/17 0524 10/15/17 1353 10/15/17 1808 10/16/17 0513  NA 125* 124* 123* 125* 126*  K 5.1 4.5 4.0 3.8 3.9  CL 92* 92* 90* 93* 95*  CO2 21* 22 24 23 23   GLUCOSE 87 87 95 118* 94  BUN 13 14 13 15 10   CREATININE 0.56* 0.65 0.64 0.75 0.58*  CALCIUM 8.7* 8.6* 8.3* 8.3* 8.4*   GFR: Estimated Creatinine Clearance: 79.6 mL/min (A) (by C-G formula based on SCr of 0.58 mg/dL (L)). Liver Function Tests: No results for input(s): AST, ALT, ALKPHOS, BILITOT, PROT, ALBUMIN in the last 168 hours. No results for input(s): LIPASE, AMYLASE in the last 168 hours. No results for input(s): AMMONIA in the last 168 hours. Coagulation Profile: No results for input(s): INR, PROTIME in the last 168 hours. Cardiac Enzymes: Recent Labs  Lab 10/15/17 0048 10/15/17 0524 10/15/17 1113  TROPONINI <0.03 <0.03 <0.03   BNP (last 3 results) No results for input(s): PROBNP in the last 8760 hours. HbA1C: No results for input(s): HGBA1C in the last 72 hours. CBG: No results for input(s): GLUCAP in the last 168 hours. Lipid Profile: No results for input(s): CHOL, HDL, LDLCALC, TRIG, CHOLHDL, LDLDIRECT in the last 72 hours. Thyroid Function Tests: Recent Labs    10/15/17 0524  TSH 2.644   Anemia Panel: Recent Labs    10/15/17 0524 10/16/17 0513  VITAMINB12  --  128*  FERRITIN 28  --   TIBC 309  --   IRON 23*  --    Sepsis Labs: No results for input(s): PROCALCITON, LATICACIDVEN in the last 168 hours.  No results found for this or any previous visit (from the past 240 hour(s)).       Radiology Studies: Dg Chest Port 1 View  Result Date: 10/14/2017 CLINICAL DATA:  Chest pain EXAM: PORTABLE CHEST 1 VIEW COMPARISON:  07/16/2017 and 03/01/2007 FINDINGS: The heart size and mediastinal contours are within normal limits. Aortic atherosclerosis without aneurysm. Pulmonary hyperinflation is noted,  upper lobe predominant likely on the basis of mild emphysematous change. Crowding of lower lobe interstitial lung markings. Tiny granuloma in the right lower lobe appears stable dating back to 2008. Mild interstitial prominence that may reflect chronic interstitial fibrosis is re- demonstrated. No alveolar consolidations. The visualized skeletal structures are unremarkable. IMPRESSION: 1. 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 2. Bilateral upper lobe predominant hyperinflation of the lungs. 3. Aortic atherosclerosis. 4. No active pulmonary disease. Electronically Signed   By: Ashley Royalty M.D.   On: 10/14/2017 16:28        Scheduled Meds: . aspirin  81 mg Oral Daily  . atorvastatin  80 mg Oral q1800  . carvedilol  6.25 mg Oral BID WC  . clopidogrel  75 mg Oral Q breakfast  . folic acid  1  mg Oral Daily  . heparin  5,000 Units Subcutaneous Q8H  . Influenza vac split quadrivalent PF  0.5 mL Intramuscular Tomorrow-1000  . isosorbide mononitrate  30 mg Oral Daily  . multivitamin with minerals  1 tablet Oral Daily  . nicotine  14 mg Transdermal Daily  . pneumococcal 23 valent vaccine  0.5 mL Intramuscular Tomorrow-1000  . sodium chloride flush  3 mL Intravenous Q12H  . sodium chloride  1 g Oral BID WC  . thiamine  100 mg Oral Daily   Continuous Infusions:   LOS: 2 days    Time spent: 35 minuets.     Elmarie Shiley, MD [facility] Pager [phone]  If 7PM-7AM, please contact night-coverage [email or web address] Password [email or web address] 10/16/2017, 11:53 AM

## 2017-10-16 NOTE — Clinical Social Work Note (Signed)
CSW spoke to patient and he confirmed he would still like to go to [facility] for short term rehab.  CSW spoke to [facility] who said they will accept patient on Monday.  Weekday social worker to continue to follow patient's progress throughout discharge planning.  Jones Broom. Sarahgrace Broman, MSW, [place]  10/16/2017 1:28 PM

## 2017-10-16 NOTE — Care Management CC44 (Signed)
Condition Code 44 Documentation Completed  Patient Details  Name: Vincent Dunlap MRN: [id number] Date of Birth: Oct 27, 1941   Condition Code 44 given:  Yes Patient signature on Condition Code 44 notice:  Yes Documentation of 2 MD's agreement:  Yes Code 44 added to claim:  Yes    Arley Phenix, RN 10/16/2017, 2:35 PM

## 2017-10-17 DIAGNOSIS — I110 Hypertensive heart disease with heart failure: Secondary | ICD-10-CM | POA: Diagnosis present

## 2017-10-17 DIAGNOSIS — F17210 Nicotine dependence, cigarettes, uncomplicated: Secondary | ICD-10-CM | POA: Diagnosis present

## 2017-10-17 DIAGNOSIS — J80 Acute respiratory distress syndrome: Secondary | ICD-10-CM | POA: Diagnosis not present

## 2017-10-17 DIAGNOSIS — J449 Chronic obstructive pulmonary disease, unspecified: Secondary | ICD-10-CM | POA: Diagnosis not present

## 2017-10-17 DIAGNOSIS — J849 Interstitial pulmonary disease, unspecified: Secondary | ICD-10-CM | POA: Diagnosis not present

## 2017-10-17 DIAGNOSIS — M6281 Muscle weakness (generalized): Secondary | ICD-10-CM | POA: Diagnosis not present

## 2017-10-17 DIAGNOSIS — Z609 Problem related to social environment, unspecified: Secondary | ICD-10-CM | POA: Diagnosis present

## 2017-10-17 DIAGNOSIS — Z7982 Long term (current) use of aspirin: Secondary | ICD-10-CM | POA: Diagnosis not present

## 2017-10-17 DIAGNOSIS — Z7902 Long term (current) use of antithrombotics/antiplatelets: Secondary | ICD-10-CM | POA: Diagnosis not present

## 2017-10-17 DIAGNOSIS — I739 Peripheral vascular disease, unspecified: Secondary | ICD-10-CM | POA: Diagnosis not present

## 2017-10-17 DIAGNOSIS — D519 Vitamin B12 deficiency anemia, unspecified: Secondary | ICD-10-CM | POA: Diagnosis not present

## 2017-10-17 DIAGNOSIS — E222 Syndrome of inappropriate secretion of antidiuretic hormone: Secondary | ICD-10-CM | POA: Diagnosis present

## 2017-10-17 DIAGNOSIS — L89613 Pressure ulcer of right heel, stage 3: Secondary | ICD-10-CM | POA: Diagnosis not present

## 2017-10-17 DIAGNOSIS — I2510 Atherosclerotic heart disease of native coronary artery without angina pectoris: Secondary | ICD-10-CM | POA: Diagnosis not present

## 2017-10-17 DIAGNOSIS — I25118 Atherosclerotic heart disease of native coronary artery with other forms of angina pectoris: Secondary | ICD-10-CM | POA: Diagnosis not present

## 2017-10-17 DIAGNOSIS — Z79899 Other long term (current) drug therapy: Secondary | ICD-10-CM | POA: Diagnosis not present

## 2017-10-17 DIAGNOSIS — R079 Chest pain, unspecified: Secondary | ICD-10-CM | POA: Diagnosis not present

## 2017-10-17 DIAGNOSIS — D649 Anemia, unspecified: Secondary | ICD-10-CM | POA: Diagnosis not present

## 2017-10-17 DIAGNOSIS — R0789 Other chest pain: Secondary | ICD-10-CM | POA: Diagnosis not present

## 2017-10-17 DIAGNOSIS — Z6821 Body mass index (BMI) 21.0-21.9, adult: Secondary | ICD-10-CM | POA: Diagnosis not present

## 2017-10-17 DIAGNOSIS — R2681 Unsteadiness on feet: Secondary | ICD-10-CM | POA: Diagnosis not present

## 2017-10-17 DIAGNOSIS — S51001A Unspecified open wound of right elbow, initial encounter: Secondary | ICD-10-CM | POA: Diagnosis present

## 2017-10-17 DIAGNOSIS — I5040 Unspecified combined systolic (congestive) and diastolic (congestive) heart failure: Secondary | ICD-10-CM | POA: Diagnosis not present

## 2017-10-17 DIAGNOSIS — M1A0210 Idiopathic chronic gout, right elbow, without tophus (tophi): Secondary | ICD-10-CM | POA: Diagnosis not present

## 2017-10-17 DIAGNOSIS — R911 Solitary pulmonary nodule: Secondary | ICD-10-CM | POA: Diagnosis not present

## 2017-10-17 DIAGNOSIS — G629 Polyneuropathy, unspecified: Secondary | ICD-10-CM | POA: Diagnosis not present

## 2017-10-17 DIAGNOSIS — X58XXXA Exposure to other specified factors, initial encounter: Secondary | ICD-10-CM | POA: Diagnosis not present

## 2017-10-17 DIAGNOSIS — I5022 Chronic systolic (congestive) heart failure: Secondary | ICD-10-CM | POA: Diagnosis not present

## 2017-10-17 DIAGNOSIS — J9811 Atelectasis: Secondary | ICD-10-CM | POA: Diagnosis not present

## 2017-10-17 DIAGNOSIS — D509 Iron deficiency anemia, unspecified: Secondary | ICD-10-CM | POA: Diagnosis present

## 2017-10-17 DIAGNOSIS — R488 Other symbolic dysfunctions: Secondary | ICD-10-CM | POA: Diagnosis not present

## 2017-10-17 DIAGNOSIS — I255 Ischemic cardiomyopathy: Secondary | ICD-10-CM | POA: Diagnosis present

## 2017-10-17 DIAGNOSIS — E871 Hypo-osmolality and hyponatremia: Secondary | ICD-10-CM | POA: Diagnosis not present

## 2017-10-17 DIAGNOSIS — R202 Paresthesia of skin: Secondary | ICD-10-CM | POA: Diagnosis not present

## 2017-10-17 DIAGNOSIS — E440 Moderate protein-calorie malnutrition: Secondary | ICD-10-CM | POA: Diagnosis present

## 2017-10-17 DIAGNOSIS — M25821 Other specified joint disorders, right elbow: Secondary | ICD-10-CM | POA: Diagnosis present

## 2017-10-17 DIAGNOSIS — Z23 Encounter for immunization: Secondary | ICD-10-CM | POA: Diagnosis not present

## 2017-10-17 DIAGNOSIS — E870 Hyperosmolality and hypernatremia: Secondary | ICD-10-CM | POA: Diagnosis not present

## 2017-10-17 DIAGNOSIS — I5030 Unspecified diastolic (congestive) heart failure: Secondary | ICD-10-CM | POA: Diagnosis not present

## 2017-10-17 LAB — SODIUM, URINE, RANDOM: Sodium, Ur: 42 mmol/L

## 2017-10-17 LAB — RENAL FUNCTION PANEL
ALBUMIN: 2.6 g/dL — AB (ref 3.5–5.0)
ANION GAP: 6 (ref 5–15)
BUN: 9 mg/dL (ref 6–20)
CALCIUM: 8.1 mg/dL — AB (ref 8.9–10.3)
CO2: 24 mmol/L (ref 22–32)
Chloride: 97 mmol/L — ABNORMAL LOW (ref 101–111)
Creatinine, Ser: 0.62 mg/dL (ref 0.61–1.24)
Glucose, Bld: 111 mg/dL — ABNORMAL HIGH (ref 65–99)
PHOSPHORUS: 3.9 mg/dL (ref 2.5–4.6)
Potassium: 3.7 mmol/L (ref 3.5–5.1)
SODIUM: 127 mmol/L — AB (ref 135–145)

## 2017-10-17 LAB — BASIC METABOLIC PANEL
Anion gap: 9 (ref 5–15)
BUN: 12 mg/dL (ref 6–20)
CALCIUM: 8.4 mg/dL — AB (ref 8.9–10.3)
CHLORIDE: 94 mmol/L — AB (ref 101–111)
CO2: 22 mmol/L (ref 22–32)
CREATININE: 0.52 mg/dL — AB (ref 0.61–1.24)
GFR calc Af Amer: >60 mL/min (ref >60)
GFR calc non Af Amer: >60 mL/min (ref >60)
GLUCOSE: 92 mg/dL (ref 65–99)
Potassium: 3.9 mmol/L (ref 3.5–5.1)
Sodium: 125 mmol/L — ABNORMAL LOW (ref 135–145)

## 2017-10-17 LAB — OSMOLALITY, URINE: Osmolality, Ur: 271 mOsm/kg — ABNORMAL LOW (ref 300–900)

## 2017-10-17 LAB — CREATININE, URINE, RANDOM: Creatinine, Urine: 39.02 mg/dL

## 2017-10-17 LAB — CORTISOL: CORTISOL PLASMA: 6.9 ug/dL

## 2017-10-17 LAB — OSMOLALITY: Osmolality: 268 mOsm/kg — ABNORMAL LOW (ref 275–295)

## 2017-10-17 NOTE — Progress Notes (Signed)
Pt is alert and oriented can be confused and forgetul at tim, one episode where pt yelled out for a lighter for a cigarette and I Refreshed Nicotine patch. Vitals stable, condom cath changed and plan to d/d [facility] today.

## 2017-10-17 NOTE — Consult Note (Signed)
Reason for Consult: Hyponatremia Referring Physician:  Tyrell Antonio, MD  Vincent Dunlap is an 76 y.o. male.  HPI: Vincent Dunlap is a 76 yo WM with PMH significant for COPD, tobacco abuse, inoperable CAD, CHF, Etoh abuse, PAD, and chronic hyponatremia who was admitted on 10/14/17 with chest pain and weakness.  Apparently has a poor living situation and can't care for himself (only eats one meal a day) and called EMS when he had trouble getting out of his chair and becoming dizzy and lightheaded.  We were consulted due to persistent hyponatremia, lower than his baseline of 128-130.  The trend in serum sodium is seen below.  His lisinopril was stopped at time of admission, however he is still taking elavil tid.  He has not edema and has poor nutritional status.  He admits to drinking about 6 beers prior to admission (when his friends bring it).  No lower extremity edema today but he states he did have it when he was admitted.  He also denies any N/V but does have a chronic cough.  Denies any recent weight loss or anorexia.    Trend in Serum Sodium: Sodium  Date/Time Value Ref Range Status  10/17/2017 12:03 PM 127 (L) 135 - 145 mmol/L Final  10/17/2017 04:57 AM 125 (L) 135 - 145 mmol/L Final  10/16/2017 03:58 PM 123 (L) 135 - 145 mmol/L Final  10/16/2017 05:13 AM 126 (L) 135 - 145 mmol/L Final  10/15/2017 06:08 PM 125 (L) 135 - 145 mmol/L Final  10/15/2017 01:53 PM 123 (L) 135 - 145 mmol/L Final  10/15/2017 05:24 AM 124 (L) 135 - 145 mmol/L Final  10/14/2017 06:45 PM 125 (L) 135 - 145 mmol/L Final  07/30/2017 05:11 AM 135 135 - 145 mmol/L Final  07/28/2017 03:36 AM 134 (L) 135 - 145 mmol/L Final  07/26/2017 05:40 AM 134 (L) 135 - 145 mmol/L Final  07/25/2017 04:04 AM 133 (L) 135 - 145 mmol/L Final  07/23/2017 05:53 AM 132 (L) 135 - 145 mmol/L Final  07/22/2017 04:29 AM 132 (L) 135 - 145 mmol/L Final  07/21/2017 04:11 AM 133 (L) 135 - 145 mmol/L Final  07/20/2017 07:43 AM 134 (L) 135 - 145 mmol/L Final   07/19/2017 04:22 AM 131 (L) 135 - 145 mmol/L Final  07/17/2017 06:43 AM 133 (L) 135 - 145 mmol/L Final  07/16/2017 09:14 PM 130 (L) 135 - 145 mmol/L Final  07/25/2016 04:08 AM 128 (L) 135 - 145 mmol/L Final  07/24/2016 04:25 AM 129 (L) 135 - 145 mmol/L Final  07/23/2016 02:25 AM 131 (L) 135 - 145 mmol/L Final  07/22/2016 03:18 AM 131 (L) 135 - 145 mmol/L Final  07/20/2016 05:56 AM 130 (L) 135 - 145 mmol/L Final  07/19/2016 03:02 AM 130 (L) 135 - 145 mmol/L Final  07/18/2016 02:00 AM 132 (L) 135 - 145 mmol/L Final  07/17/2016 07:05 AM 137 135 - 145 mmol/L Final  08/29/2014 04:21 PM 134 (L) 137 - 147 mEq/L Final  08/30/2012 01:48 PM 135 (L) 136 - 145 mEq/L Final  08/29/2011 05:15 AM 123 (L) 135 - 145 mEq/L Final  08/28/2011 05:30 AM 129 (L) 135 - 145 mEq/L Final  08/19/2011 12:11 PM 138 135 - 145 mEq/L Final  08/01/2009 05:35 AM 133 (L) 135 - 145 mEq/L Final  07/31/2009 04:26 AM 131 (L) 135 - 145 mEq/L Final  07/30/2009 03:40 AM 135 135 - 145 mEq/L Final  07/29/2009 12:45 AM 132 (L) 135 - 145 mEq/L Final   PMH:  Past Medical History:  Diagnosis Date  . CAD (coronary artery disease) 07/2016   3 vessel disease,  evaluated by Dr Servando Snare and felt to be a poor surgical candidate  . CHF (congestive heart failure) ([place])   . Hypertension   . Ischemic cardiomyopathy 2017   EF 30%  . Tobacco abuse     PSH:   Past Surgical History:  Procedure Laterality Date  . ABDOMINAL AORTOGRAM W/LOWER EXTREMITY Right 07/26/2017   Procedure: ABDOMINAL AORTOGRAM W/LOWER EXTREMITY;  Surgeon: Serafina Mitchell, MD;  Location: [place] [facility];  Service: Cardiovascular;  Laterality: Right;  . AMPUTATION Right 07/28/2017   Procedure: AMPUTATION RIGHT Second TOE;  Surgeon: Waynetta Sandy, MD;  Location: [place];  Service: Vascular;  Laterality: Right;  . CARDIAC CATHETERIZATION N/A 07/19/2016   Procedure: Right/Left Heart Cath and Coronary Angiography;  Surgeon: Troy Sine, MD;  Location: [place] [facility];  Service: Cardiovascular;  Laterality: N/A;  . INGUINAL HERNIA REPAIR Bilateral   . PERIPHERAL VASCULAR INTERVENTION Right 07/26/2017   Procedure: PERIPHERAL VASCULAR INTERVENTION;  Surgeon: Serafina Mitchell, MD;  Location: [place] [facility];  Service: Cardiovascular;  Laterality: Right;    Allergies:  Allergies  Allergen Reactions  . Chantix [Varenicline] Other (See Comments)    Pt states it made him "crazy"    Medications:   Prior to Admission medications   Medication Sig Start Date End Date Taking? Authorizing Provider  acetaminophen (TYLENOL) 325 MG tablet Take 2 tablets (650 mg total) by mouth every 4 (four) hours as needed for headache or mild pain. 07/30/17  Yes Barton Dubois, MD  amitriptyline (ELAVIL) 25 MG tablet Take 25 mg by mouth 3 (three) times daily.    Yes [provider]  amLODipine-benazepril (LOTREL) 5-20 MG capsule Take 1 capsule by mouth daily.    [provider]  aspirin 81 MG chewable tablet Chew 1 tablet (81 mg total) by mouth daily. 07/31/17   Barton Dubois, MD  atorvastatin (LIPITOR) 80 MG tablet Take 1 tablet (80 mg total) by mouth daily at 6 PM. 07/25/16   Regalado, Belkys A, MD  bisacodyl (DULCOLAX) 10 MG suppository Place 1 suppository (10 mg total) rectally daily as needed for severe constipation. 07/30/17   Barton Dubois, MD  carvedilol (COREG) 6.25 MG tablet Take 1 tablet (6.25 mg total) by mouth 2 (two) times daily with a meal. 07/25/16   Regalado, Belkys A, MD  clopidogrel (PLAVIX) 75 MG tablet Take 1 tablet (75 mg total) by mouth daily with breakfast. 07/31/17   Barton Dubois, MD  esomeprazole (NEXIUM) 40 MG capsule Take 40 mg by mouth daily at 12 noon.    [provider]  feeding supplement, ENSURE ENLIVE, (ENSURE ENLIVE) LIQD Take 237 mLs by mouth 2 (two) times daily between meals. 07/30/17   Barton Dubois, MD  isosorbide mononitrate (IMDUR) 30 MG 24 hr tablet Take 1 tablet (30 mg total) by mouth daily. 07/31/17    Barton Dubois, MD  lisinopril (PRINIVIL,ZESTRIL) 2.5 MG tablet Take 1 tablet (2.5 mg total) by mouth daily. 07/26/16   Regalado, Belkys A, MD  nicotine (NICODERM CQ - DOSED IN MG/24 HOURS) 14 mg/24hr patch Place 1 patch (14 mg total) onto the skin daily. 07/31/17   Barton Dubois, MD  nitroGLYCERIN (NITROSTAT) 0.4 MG SL tablet Place 1 tablet (0.4 mg total) under the tongue every 5 (five) minutes as needed for chest pain. 07/25/16   Regalado, Belkys A, MD  oxyCODONE (ROXICODONE) 5 MG  immediate release tablet Take 1 tablet (5 mg total) by mouth every 8 (eight) hours as needed for severe pain. 07/30/17 07/30/18  Barton Dubois, MD  polyethylene glycol [facility] / Floria Raveling) packet Take 17 g by mouth 2 (two) times daily. 07/30/17   Barton Dubois, MD  spironolactone (ALDACTONE) 25 MG tablet Take 25 mg by mouth daily.    [provider]    Discontinued Meds:   Medications Discontinued During This Encounter  Medication Reason  . Influenza vac split quadrivalent PF (FLUZONE HIGH-DOSE) injection 0.5 mL   . lisinopril (PRINIVIL,ZESTRIL) tablet 2.5 mg   . doxycycline (VIBRA-TABS) 100 MG tablet Completed Course    Social History:  reports that he has been smoking.  He has quit using smokeless tobacco. He reports that he does not drink alcohol or use drugs.  Family History:   Family History  Problem Relation Age of Onset  . Alzheimer's disease Mother   . Diabetes Mother        Diabetes in the maternal side of the family but not in mother  . Heart disease Neg Hx   . Cancer Neg Hx     Pertinent items are noted in HPI.  Blood pressure (!) 110/45, pulse 87, temperature 97.7 F (36.5 C), temperature source Oral, resp. rate 18, height 5\' 11"  (1.803 m), weight 74.1 kg (163 lb 5.8 oz), SpO2 100 %. General appearance: alert, cooperative, appears stated age, no distress and frail and thin Head: Normocephalic, without obvious abnormality, atraumatic Resp: clear to auscultation bilaterally Cardio: no  rub GI: soft, non-tender; bowel sounds normal; no masses,  no organomegaly Extremities: no edema  Labs: Basic Metabolic Panel: Recent Labs  Lab 10/15/17 0524 10/15/17 1353 10/15/17 1808 10/16/17 0513 10/16/17 1558 10/17/17 0457 10/17/17 1203  NA 124* 123* 125* 126* 123* 125* 127*  K 4.5 4.0 3.8 3.9 3.9 3.9 3.7  CL 92* 90* 93* 95* 93* 94* 97*  CO2 22 24 23 23 23 22 24   GLUCOSE 87 95 118* 94 112* 92 111*  BUN 14 13 15 10 13 12 9   CREATININE 0.65 0.64 0.75 0.58* 0.60* 0.52* 0.62  ALBUMIN  --   --   --   --   --   --  2.6*  CALCIUM 8.6* 8.3* 8.3* 8.4* 8.1* 8.4* 8.1*  PHOS  --   --   --   --   --   --  3.9   Liver Function Tests: Recent Labs  Lab 10/17/17 1203  ALBUMIN 2.6*   No results for input(s): LIPASE, AMYLASE in the last 168 hours. No results for input(s): AMMONIA in the last 168 hours. CBC: Recent Labs  Lab 10/14/17 1845 10/15/17 0524 10/16/17 0513  WBC 12.7* 8.5 9.6  NEUTROABS 7.9*  --   --   HGB 9.9* 9.5* 9.3*  HCT 29.6* 27.9* 27.4*  MCV 75.3* 74.2* 73.7*  PLT 139* 136* 201   PT/INR: @labrcntip (inr:5) Cardiac Enzymes: Recent Labs  Lab 10/15/17 0048 10/15/17 0524 10/15/17 1113  TROPONINI <0.03 <0.03 <0.03   CBG: No results for input(s): GLUCAP in the last 168 hours.  Iron Studies:  Recent Labs  Lab 10/15/17 0524  IRON 23*  TIBC 309  FERRITIN 28    Xrays/Other Studies: No results found.   Assessment/Plan: 1.  Hyponatremia- chronic but lower than baseline.  Uosm down to 271, UNa 42, Sosm 268, cortisol remains low.  Recommend cosyntropin stim test and will need workup for possible malignancy, ie. Chest ct given long  smoking history and copd.  Otherwise, sodium levels are improving with fluid restriction and adequate nutrition.  May want to add lasix as well as stopping elavil. 2. Inoperable CAD- CP free 3. Anemia- microcytic, iron and B12 deficient, on supplements.  Again would perform malignancy workup appropriate for his age.  Will order  SPEP/UPEP, guaiac stools, and follow H/H.  4. COPD 5. CHF 6. Low cortisol- possible adrenal insufficiency and recommend cosyntropin stim test 7. Deconditioning- per primiary svc 8. Disposition- will need SNF placement.    Vincent Dunlap 10/17/2017, 2:56 PM

## 2017-10-17 NOTE — Progress Notes (Signed)
PROGRESS NOTE    Vincent Dunlap  KNL:[id number] DOB: 1940/12/30 DOA: 10/14/2017 PCP: Thressa Sheller, MD   Brief Narrative:This 76 year old man with coronary artery disease who has triple-vessel disease deemed not a candidate for CABG due to comorbidities and frailty, history of heart failure with most recent echo revealing improvement of ejection fraction to 55%, current every day smoker, COPD, peripheral arterial disease, and poor social living situation presenting to the emergency department with chest pain.  Patient reports living [place], he is a retired Building control surveyor, he reports needing help with his ADLs. His roommate moved out 4-5 days prior to admission and he has been a difficult position. He receives one meal a day for Meals on Wheels and otherwise snacks or whatever is available. He reports adequate by mouth intake as far as fluids ago. He reports drinking alcohol at times up to 6-7 beers a day, last consumed that amount 2 weeks ago according to his report. He did having difficulty getting out of his chair, citing that when he does he becomes dizzy and lightheaded. On day of admission he developed central chest pain that lasts for 30 minutes, described as sharp, exacerbated by emotional stress. He took a sublingual nitroglycerin and was taken to the hospital for further evaluation.  Reports some wheezing, no fevers, chills, productive cough.  Assessment & Plan:   Active Problems:   CAD (coronary artery disease), native coronary artery   Chronic systolic CHF (congestive heart failure) (HCC)   COPD (chronic obstructive pulmonary disease) (HCC)   Chest pain   Hyponatremia  Chest pain in setting of CAD, prior HF (now preserved EF), and peripheral arterial disease Chest pain free.  Continue with Imdur, aspirin and plavix.  Troponin negative.   Hyponatremia; suspect SIADH, urine sodium more than 40 and urine osmolality more than 300/  Water restriction.  Cortisol 6.2. TSH 2.6, urine  osmolality 328. Started on sodium tablet. Sodium increasing, but again trending down. Nephrology consulted for assistance.     COPD: nebulizer PRN  -Functional decline / poor living situation: needs PT, ot SNF B 12 low, started  supplement.  Continue with thiamine and folic acid.   -Anemia, microcytic: iron deficiency. Started  iron supplement  B 12 low, start injection.   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  Started B 12 supplements.   DVT prophylaxis: Heparin  Code Status: full code.  Family Communication: care discussed with patient.  Disposition Plan: (will need SNF  Consultants:  none  Procedures: none   Antimicrobials:  none  Subjective: He is alert, hr relates I am only drinking what they let me.  He has cup of water in his trait.    Objective: Vitals:   10/16/17 1952 10/17/17 0253 10/17/17 0541 10/17/17 1146  BP: (!) 103/45  115/63 (!) 110/45  Pulse: 89  84 87  Resp: 20   18  Temp: 97.6 F (36.4 C)  97.7 F (36.5 C) 97.7 F (36.5 C)  TempSrc: Oral  Oral Oral  SpO2: 100%  98% 100%  Weight:  74.1 kg (163 lb 5.8 oz)    Height:        Intake/Output Summary (Last 24 hours) at 10/17/2017 1221 Last data filed at 10/17/2017 0933 Gross per 24 hour  Intake 960 ml  Output 1750 ml  Net -790 ml   Filed Weights   10/15/17 0538 10/16/17 0449 10/17/17 0253  Weight: 71 kg (156 lb 8.4 oz) 71.6 kg (157 lb 13.6 oz) 74.1 kg (163 lb  5.8 oz)    Examination:  General exam: NAD Respiratory system: CTA Cardiovascular system: S 1, S 2 , RRR Gastrointestinal system: BS present, soft.  Central nervous system: alert , follows command.  Extremities: Symmetric power.     Data Reviewed: I have personally reviewed following labs and imaging studies  CBC: Recent Labs  Lab 10/14/17 1845 10/15/17 0524 10/16/17 0513  WBC 12.7* 8.5 9.6  NEUTROABS 7.9*  --   --   HGB 9.9* 9.5* 9.3*  HCT 29.6* 27.9* 27.4*  MCV 75.3* 74.2* 73.7*  PLT 139* 136* 324    Basic Metabolic Panel: Recent Labs  Lab 10/15/17 1353 10/15/17 1808 10/16/17 0513 10/16/17 1558 10/17/17 0457  NA 123* 125* 126* 123* 125*  K 4.0 3.8 3.9 3.9 3.9  CL 90* 93* 95* 93* 94*  CO2 24 23 23 23 22   GLUCOSE 95 118* 94 112* 92  BUN 13 15 10 13 12   CREATININE 0.64 0.75 0.58* 0.60* 0.52*  CALCIUM 8.3* 8.3* 8.4* 8.1* 8.4*   GFR: Estimated Creatinine Clearance: 82.3 mL/min (A) (by C-G formula based on SCr of 0.52 mg/dL (L)). Liver Function Tests: No results for input(s): AST, ALT, ALKPHOS, BILITOT, PROT, ALBUMIN in the last 168 hours. No results for input(s): LIPASE, AMYLASE in the last 168 hours. No results for input(s): AMMONIA in the last 168 hours. Coagulation Profile: No results for input(s): INR, PROTIME in the last 168 hours. Cardiac Enzymes: Recent Labs  Lab 10/15/17 0048 10/15/17 0524 10/15/17 1113  TROPONINI <0.03 <0.03 <0.03   BNP (last 3 results) No results for input(s): PROBNP in the last 8760 hours. HbA1C: No results for input(s): HGBA1C in the last 72 hours. CBG: No results for input(s): GLUCAP in the last 168 hours. Lipid Profile: No results for input(s): CHOL, HDL, LDLCALC, TRIG, CHOLHDL, LDLDIRECT in the last 72 hours. Thyroid Function Tests: Recent Labs    10/15/17 0524  TSH 2.644   Anemia Panel: Recent Labs    10/15/17 0524 10/16/17 0513  VITAMINB12  --  128*  FERRITIN 28  --   TIBC 309  --   IRON 23*  --    Sepsis Labs: No results for input(s): PROCALCITON, LATICACIDVEN in the last 168 hours.  No results found for this or any previous visit (from the past 240 hour(s)).       Radiology Studies: No results found.      Scheduled Meds: . amitriptyline  25 mg Oral TID  . aspirin  81 mg Oral Daily  . atorvastatin  80 mg Oral q1800  . carvedilol  6.25 mg Oral BID WC  . clopidogrel  75 mg Oral Q breakfast  . ferrous sulfate  325 mg Oral BID WC  . folic acid  1 mg Oral Daily  . heparin  5,000 Units Subcutaneous Q8H   . isosorbide mononitrate  30 mg Oral Daily  . multivitamin with minerals  1 tablet Oral Daily  . nicotine  14 mg Transdermal Daily  . sodium chloride flush  3 mL Intravenous Q12H  . sodium chloride  1 g Oral BID WC  . thiamine  100 mg Oral Daily  . vitamin B-12  100 mcg Oral Daily   Continuous Infusions:   LOS: 2 days    Time spent: 35 minuets.     Elmarie Shiley, MD [facility] Pager [phone]  If 7PM-7AM, please contact night-coverage [email or web address] Password [email or web address] 10/17/2017, 12:21 PM

## 2017-10-17 NOTE — Plan of Care (Signed)
  Clinical Measurements: Will remain free from infection 10/17/2017 2080 - Progressing by Rolm Baptise, RN Diagnostic test results will improve 10/17/2017 [phone] - Progressing by Rolm Baptise, RN Respiratory complications will improve 10/17/2017 0742 - Progressing by Rolm Baptise, RN Cardiovascular complication will be avoided 10/17/2017 6122 - Progressing by Rolm Baptise, RN   Activity: Risk for activity intolerance will decrease 10/17/2017 0742 - Not Progressing by Rolm Baptise, RN

## 2017-10-17 NOTE — Progress Notes (Signed)
Physical Therapy Treatment Patient Details Name: Vincent Dunlap MRN: [id number] DOB: 08-09-1941 Today's Date: 10/17/2017    History of Present Illness Patient is a 76 y/o male admitted due to chest pain.  PMH positive for HTN, hyperlipidemia, COPD, GERD, depression, CHF w/ EF 30-35%, CAD, chronic thrombocytopenia.     PT Comments    Pt progressing slowly towards physical therapy goals. Was not willing to attempt RW use or progression towards ambulation this session due to fatigue. Was agreeable to OOB however, and was able to complete with mod assist for balance support and safety. Will continue to follow and progress as able per POC.    Follow Up Recommendations  SNF;Supervision/Assistance - 24 hour     Equipment Recommendations  None recommended by PT    Recommendations for Other Services       Precautions / Restrictions Precautions Precautions: Fall Restrictions Weight Bearing Restrictions: No    Mobility  Bed Mobility Overal bed mobility: Needs Assistance Bed Mobility: Supine to Sit     Supine to sit: Min assist     General bed mobility comments: Pt was encouraged to be as independent as possible. Assist for trunk support as pt transitioned to EOB.   Transfers Overall transfer level: Needs assistance Equipment used: 1 person hand held assist Transfers: Stand Pivot Transfers   Stand pivot transfers: Mod assist       General transfer comment: Pt reports he has not walked in over a year and was only agreeable to SPT. Heavy mod assist provided for lifting and lowering support from bed to chair.   Ambulation/Gait             General Gait Details: Deferred as pt reports he does not walk at baseline.    Stairs            Wheelchair Mobility    Modified Rankin (Stroke Patients Only)       Balance Overall balance assessment: Needs assistance Sitting-balance support: Feet supported;No upper extremity supported Sitting balance-Leahy Scale: Fair      Standing balance support: No upper extremity supported;During functional activity Standing balance-Leahy Scale: Poor                              Cognition Arousal/Alertness: Awake/alert Behavior During Therapy: WFL for tasks assessed/performed Overall Cognitive Status: Within Functional Limits for tasks assessed                                        Exercises      General Comments        Pertinent Vitals/Pain Pain Assessment: Faces Faces Pain Scale: Hurts even more Pain Location: feet with weight bearing Pain Descriptors / Indicators: Tender Pain Intervention(s): Limited activity within patient's tolerance;Monitored during session;Repositioned    Home Living                      Prior Function            PT Goals (current goals can now be found in the care plan section) Acute Rehab PT Goals Patient Stated Goal: To get some help PT Goal Formulation: With patient Time For Goal Achievement: 10/28/17 Potential to Achieve Goals: Fair Progress towards PT goals: Progressing toward goals    Frequency    Min 3X/week      PT Plan  Current plan remains appropriate    Co-evaluation              AM-PAC PT "6 Clicks" Daily Activity  Outcome Measure  Difficulty turning over in bed (including adjusting bedclothes, sheets and blankets)?: A Little Difficulty moving from lying on back to sitting on the side of the bed? : Unable Difficulty sitting down on and standing up from a chair with arms (e.g., wheelchair, bedside commode, etc,.)?: Unable Help needed moving to and from a bed to chair (including a wheelchair)?: A Lot Help needed walking in hospital room?: Total Help needed climbing 3-5 steps with a railing? : Total 6 Click Score: 9    End of Session Equipment Utilized During Treatment: Gait belt Activity Tolerance: Patient limited by fatigue Patient left: in chair;with call bell/phone within reach;with chair alarm  set Nurse Communication: Mobility status PT Visit Diagnosis: Unsteadiness on feet (R26.81);Muscle weakness (generalized) (M62.81)     Time: 7096-4383 PT Time Calculation (min) (ACUTE ONLY): 22 min  Charges:  $Gait Training: 8-22 mins                    G Codes:       Rolinda Roan, PT, DPT Acute Rehabilitation Services Pager: [place] 10/17/2017, 4:05 PM

## 2017-10-18 ENCOUNTER — Inpatient Hospital Stay (HOSPITAL_COMMUNITY): Payer: Medicare Other

## 2017-10-18 LAB — CBC
HEMATOCRIT: 26.7 % — AB (ref 39.0–52.0)
HEMOGLOBIN: 8.8 g/dL — AB (ref 13.0–17.0)
MCH: 24.9 pg — AB (ref 26.0–34.0)
MCHC: 33 g/dL (ref 30.0–36.0)
MCV: 75.6 fL — ABNORMAL LOW (ref 78.0–100.0)
Platelets: 184 10*3/uL (ref 150–400)
RBC: 3.53 MIL/uL — AB (ref 4.22–5.81)
RDW: 17.5 % — ABNORMAL HIGH (ref 11.5–15.5)
WBC: 8.4 10*3/uL (ref 4.0–10.5)

## 2017-10-18 LAB — RENAL FUNCTION PANEL
ANION GAP: 7 (ref 5–15)
Albumin: 2.6 g/dL — ABNORMAL LOW (ref 3.5–5.0)
BUN: 11 mg/dL (ref 6–20)
CO2: 22 mmol/L (ref 22–32)
Calcium: 8.4 mg/dL — ABNORMAL LOW (ref 8.9–10.3)
Chloride: 97 mmol/L — ABNORMAL LOW (ref 101–111)
Creatinine, Ser: 0.54 mg/dL — ABNORMAL LOW (ref 0.61–1.24)
GFR calc non Af Amer: >60 mL/min (ref >60)
GLUCOSE: 82 mg/dL (ref 65–99)
PHOSPHORUS: 3.8 mg/dL (ref 2.5–4.6)
POTASSIUM: 3.7 mmol/L (ref 3.5–5.1)
Sodium: 126 mmol/L — ABNORMAL LOW (ref 135–145)

## 2017-10-18 LAB — URIC ACID: Uric Acid, Serum: 5.4 mg/dL (ref 4.4–7.6)

## 2017-10-18 MED ORDER — CARVEDILOL 6.25 MG PO TABS
6.2500 mg | ORAL_TABLET | Freq: Two times a day (BID) | ORAL | Status: DC
  Administered 2017-10-19 – 2017-10-20 (×3): 6.25 mg via ORAL
  Filled 2017-10-18 (×3): qty 1

## 2017-10-18 MED ORDER — POLYETHYLENE GLYCOL 3350 17 G PO PACK
17.0000 g | PACK | Freq: Every day | ORAL | Status: DC
  Administered 2017-10-18 – 2017-10-20 (×3): 17 g via ORAL
  Filled 2017-10-18 (×3): qty 1

## 2017-10-18 MED ORDER — SODIUM CHLORIDE 1 G PO TABS
1.0000 g | ORAL_TABLET | Freq: Two times a day (BID) | ORAL | Status: DC
  Administered 2017-10-19 – 2017-10-20 (×3): 1 g via ORAL
  Filled 2017-10-18 (×4): qty 1

## 2017-10-18 MED ORDER — COSYNTROPIN 0.25 MG IJ SOLR
0.2500 mg | Freq: Once | INTRAMUSCULAR | Status: AC
  Administered 2017-10-19: 0.25 mg via INTRAVENOUS
  Filled 2017-10-18: qty 0.25

## 2017-10-18 MED ORDER — FUROSEMIDE 20 MG PO TABS
20.0000 mg | ORAL_TABLET | Freq: Every day | ORAL | Status: DC
  Administered 2017-10-18 – 2017-10-20 (×3): 20 mg via ORAL
  Filled 2017-10-18 (×3): qty 1

## 2017-10-18 MED ORDER — SODIUM CHLORIDE 0.9 % IV BOLUS (SEPSIS)
250.0000 mL | Freq: Once | INTRAVENOUS | Status: AC
  Administered 2017-10-18: 250 mL via INTRAVENOUS

## 2017-10-18 MED ORDER — SENNA 8.6 MG PO TABS
1.0000 | ORAL_TABLET | Freq: Every day | ORAL | Status: DC
  Administered 2017-10-18 – 2017-10-20 (×3): 8.6 mg via ORAL
  Filled 2017-10-18 (×3): qty 1

## 2017-10-18 MED ORDER — CLOPIDOGREL BISULFATE 75 MG PO TABS
75.0000 mg | ORAL_TABLET | Freq: Every day | ORAL | Status: DC
  Administered 2017-10-19 – 2017-10-20 (×2): 75 mg via ORAL
  Filled 2017-10-18 (×2): qty 1

## 2017-10-18 MED ORDER — FERROUS SULFATE 325 (65 FE) MG PO TABS
325.0000 mg | ORAL_TABLET | Freq: Two times a day (BID) | ORAL | Status: DC
  Administered 2017-10-19 – 2017-10-20 (×3): 325 mg via ORAL
  Filled 2017-10-18 (×3): qty 1

## 2017-10-18 MED ORDER — IOPAMIDOL (ISOVUE-300) INJECTION 61%
INTRAVENOUS | Status: AC
  Administered 2017-10-18: 75 mL via INTRAVENOUS
  Filled 2017-10-18: qty 75

## 2017-10-18 NOTE — Consult Note (Signed)
   [facility] Inpatient Consult   10/18/2017  Vincent Dunlap 08-Sep-1941 [id number]  Patient was assessed for [facility] [place] [facility] for community services in the [employer] [place]. Patient was previously active with [facility].  Met with patient at bedside regarding being restarted with [facility] services. Consent form is on file.  Patient states he desires to have rehab at [facility].  He agrees to follow up at the skilled facility with [facility] CSW.    Of note, [facility] Care Management services does not replace or interfere with any services that are arranged by inpatient case management or social work. For additional questions or referrals please contact:   Natividad Brood, RN BSN [facility] Liaison  [phone] business mobile phone Toll free office [phone]

## 2017-10-18 NOTE — Progress Notes (Signed)
PROGRESS NOTE    Vincent Dunlap  GYJ:[id number] DOB: February 15, 1941 DOA: 10/14/2017 PCP: Thressa Sheller, MD   Brief Narrative:This 76 year old man with coronary artery disease who has triple-vessel disease deemed not a candidate for CABG due to comorbidities and frailty, history of heart failure with most recent echo revealing improvement of ejection fraction to 55%, current every day smoker, COPD, peripheral arterial disease, and poor social living situation presenting to the emergency department with chest pain.  Patient reports living [place], he is a retired Building control surveyor, he reports needing help with his ADLs. His roommate moved out 4-5 days prior to admission and he has been a difficult position. He receives one meal a day for Meals on Wheels and otherwise snacks or whatever is available. He reports adequate by mouth intake as far as fluids ago. He reports drinking alcohol at times up to 6-7 beers a day, last consumed that amount 2 weeks ago according to his report. He did having difficulty getting out of his chair, citing that when he does he becomes dizzy and lightheaded. On day of admission he developed central chest pain that lasts for 30 minutes, described as sharp, exacerbated by emotional stress. He took a sublingual nitroglycerin and was taken to the hospital for further evaluation.  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 He was started on sodium tablet and water restriction. Hyponatremia no improving. Nephrology consulted. consynthropin test ordered. Plan to start lasix today. Plan to be transfer to SNF when hyponatremia improved.   Assessment & Plan:   Active Problems:   CAD (coronary artery disease), native coronary artery   Chronic systolic CHF (congestive heart failure) (HCC)   COPD (chronic obstructive pulmonary disease) (HCC)   Chest pain   Hyponatremia  Chest pain in setting of CAD, prior HF (now preserved EF), and peripheral arterial disease Chest  pain free.  Continue with Imdur, aspirin and plavix.  Troponin negative.   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  Cortisol 6.2. TSH 2.6, urine osmolality 328. Rule out adrenal insufficiency. consynthropin test ordered. Continue with  sodium tablet. Sodium improving initially , but again trending down. Nephrology consulted for assistance.  CT chest 3. 5 pulmonary nodule. Need CT follow up.  Plan to start lasix.    COPD: nebulizer PRN  -Functional decline / poor living situation: needs PT, ot SNF B 12 low, started  supplement.  Continue with thiamine and folic acid.   -Anemia, microcytic: iron deficiency. Started  iron supplement  B 12 low, start injection.   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  Started B 12 supplements.   DVT prophylaxis: Heparin  Code Status: full code.  Family Communication: care discussed with patient.  Disposition Plan: (will need SNF  Consultants:  none  Procedures: none   Antimicrobials:  none  Subjective: He is alert, slowly answering question.    Objective: Vitals:   10/17/17 1146 10/17/17 1930 10/18/17 0500 10/18/17 0950  BP: (!) 110/45 (!) 111/47 (!) 107/51 (!) 100/52  Pulse: 87 84 83 84  Resp: 18 18 18    Temp: 97.7 F (36.5 C) 98.2 F (36.8 C) 98.1 F (36.7 C)   TempSrc: Oral Oral Oral   SpO2: 100% 98% 97% 98%  Weight:   74.2 kg (163 lb 9.3 oz)   Height:        Intake/Output Summary (Last 24 hours) at 10/18/2017 1136 Last data filed  at 10/18/2017 0953 Gross per 24 hour  Intake 423 ml  Output 1175 ml  Net -752 ml   Filed Weights   10/16/17 0449 10/17/17 0253 10/18/17 0500  Weight: 71.6 kg (157 lb 13.6 oz) 74.1 kg (163 lb 5.8 oz) 74.2 kg (163 lb 9.3 oz)    Examination:  General exam: NAD Respiratory system: normal respiratory effort, crackles bases.  Cardiovascular system: S 1, S 2 RRR Gastrointestinal system: BS  present, soft, nt Central nervous system: alert, follows command.   Extremities: Symmetric power.     Data Reviewed: I have personally reviewed following labs and imaging studies  CBC: Recent Labs  Lab 10/14/17 1845 10/15/17 0524 10/16/17 0513 10/18/17 0556  WBC 12.7* 8.5 9.6 8.4  NEUTROABS 7.9*  --   --   --   HGB 9.9* 9.5* 9.3* 8.8*  HCT 29.6* 27.9* 27.4* 26.7*  MCV 75.3* 74.2* 73.7* 75.6*  PLT 139* 136* 201 073   Basic Metabolic Panel: Recent Labs  Lab 10/16/17 0513 10/16/17 1558 10/17/17 0457 10/17/17 1203 10/18/17 0556  NA 126* 123* 125* 127* 126*  K 3.9 3.9 3.9 3.7 3.7  CL 95* 93* 94* 97* 97*  CO2 23 23 22 24 22   GLUCOSE 94 112* 92 111* 82  BUN 10 13 12 9 11   CREATININE 0.58* 0.60* 0.52* 0.62 0.54*  CALCIUM 8.4* 8.1* 8.4* 8.1* 8.4*  PHOS  --   --   --  3.9 3.8   GFR: Estimated Creatinine Clearance: 82.4 mL/min (A) (by C-G formula based on SCr of 0.54 mg/dL (L)). Liver Function Tests: Recent Labs  Lab 10/17/17 1203 10/18/17 0556  ALBUMIN 2.6* 2.6*   No results for input(s): LIPASE, AMYLASE in the last 168 hours. No results for input(s): AMMONIA in the last 168 hours. Coagulation Profile: No results for input(s): INR, PROTIME in the last 168 hours. Cardiac Enzymes: Recent Labs  Lab 10/15/17 0048 10/15/17 0524 10/15/17 1113  TROPONINI <0.03 <0.03 <0.03   BNP (last 3 results) No results for input(s): PROBNP in the last 8760 hours. HbA1C: No results for input(s): HGBA1C in the last 72 hours. CBG: No results for input(s): GLUCAP in the last 168 hours. Lipid Profile: No results for input(s): CHOL, HDL, LDLCALC, TRIG, CHOLHDL, LDLDIRECT in the last 72 hours. Thyroid Function Tests: No results for input(s): TSH, T4TOTAL, FREET4, T3FREE, THYROIDAB in the last 72 hours. Anemia Panel: Recent Labs    10/16/17 0513  VITAMINB12 128*   Sepsis Labs: No results for input(s): PROCALCITON, LATICACIDVEN in the last 168 hours.  No results found for this  or any previous visit (from the past 240 hour(s)).       Radiology Studies: Ct Chest W Contrast  Result Date: 10/18/2017 CLINICAL DATA:  Chronic chest pain with hypotension.  Cough. EXAM: CT CHEST WITH CONTRAST TECHNIQUE: Multidetector CT imaging of the chest was performed during intravenous contrast administration. CONTRAST:  75 mL ISOVUE-300 IOPAMIDOL (ISOVUE-300) INJECTION 61% COMPARISON:  Chest CT March 01, 2007; chest radiograph October 14, 2017 FINDINGS: Cardiovascular: There is no thoracic aortic aneurysm or dissection. There are scattered foci of calcification in the visualized great vessels, most notably in the proximal left subclavian artery. There are foci of calcification in the thoracic aorta. There are multiple foci of coronary artery calcification. There is no appreciable pericardial effusion or thickening. There is no major vessel pulmonary embolus. Mediastinum/Nodes: Thyroid appears normal. There are scattered subcentimeter axillary and mediastinal lymph nodes. There is a lymph node in the right  hilum measuring 1.4 x 1.3 cm. There is a sub- carinal lymph node measuring 1.8 x 1.1 cm. No esophageal lesions are appreciable. Lungs/Pleura: There is extensive underlying centrilobular emphysematous change. There are multiple bullae throughout the upper lobes bilaterally. There is interstitial fibrotic change in the posterior segment of the right upper lobe as well as to a lesser degree in the posterior segment of the left upper lobe. There is upper lobe bronchiectatic change bilaterally, more pronounced on the right than on the left. There is fibrosis in portions of the superior and lateral segments of the right lower lobe. There is lower lobe bronchiectatic change bilaterally, more severe on the right than on the left. There is bibasilar atelectatic change. There is mild scarring in the right middle lobe and lingular regions as well as in both lower lobes more centrally. On axial slice 91 series  4, there is a 5 mm nodular lesion in the superior segment of the left lower lobe. There is no edema or consolidation. There is no appreciable pleural effusion. Upper Abdomen: Visualized upper abdominal structures appear normal except for calcification in the aorta and proximal major mesenteric arterial vessels. There is marked calcification at the celiac axis without frank occlusion of this vessel. Musculoskeletal: There is degenerative change in the thoracic spine with diffuse idiopathic skeletal hyperostosis. IMPRESSION: 1. Extensive underlying emphysematous change with bullae in both upper lobes, more on the right than on the left. There is bronchiectatic change bilaterally, more severe on the right than on the left. Areas of fibrosis noted in the posterior segments of both upper lobes, more on the right than on the left as well as in portions of the right lower lobe. 2.  Bibasilar atelectasis.  No edema or consolidation. 3. 5 mm nodular opacity left lower lobe. No follow-up needed if patient is low-risk. Non-contrast chest CT can be considered in 12 months if patient is high-risk. This recommendation follows the consensus statement: Guidelines for Management of Incidental Pulmonary Nodules Detected on CT Images: From the Fleischner Society 2017; Radiology 2017; 284:228-243. 4. Mildly prominent right hilar and sub- carinal lymph nodes. Suspect reactive etiology given the changes in the lungs. 5. Aortic and major mesenteric/great vessel atherosclerotic calcification. There are foci of coronary artery calcification. Aortic Atherosclerosis (ICD10-I70.0) and Emphysema (ICD10-J43.9). Electronically Signed   By: Lowella Grip III M.D.   On: 10/18/2017 09:04        Scheduled Meds: . aspirin  81 mg Oral Daily  . atorvastatin  80 mg Oral q1800  . carvedilol  6.25 mg Oral BID WC  . clopidogrel  75 mg Oral Q breakfast  . [START ON 10/19/2017] cosyntropin  0.25 mg Intravenous Once  . ferrous sulfate  325 mg  Oral BID WC  . folic acid  1 mg Oral Daily  . furosemide  20 mg Oral Daily  . heparin  5,000 Units Subcutaneous Q8H  . isosorbide mononitrate  30 mg Oral Daily  . multivitamin with minerals  1 tablet Oral Daily  . nicotine  14 mg Transdermal Daily  . sodium chloride flush  3 mL Intravenous Q12H  . sodium chloride  1 g Oral BID WC  . thiamine  100 mg Oral Daily  . vitamin B-12  100 mcg Oral Daily   Continuous Infusions:   LOS: 3 days    Time spent: 35 minuets.     Elmarie Shiley, MD [facility] Pager [phone]  If 7PM-7AM, please contact night-coverage [email or web address] Password [facility] 10/18/2017, 11:36  AM

## 2017-10-18 NOTE — Progress Notes (Signed)
Patient ID: Vincent Dunlap, male   DOB: 08-28-1941, 76 y.o.   MRN: [id number] S: No complaints O:BP (!) 100/52 (BP Location: Left Arm)   Pulse 84   Temp 98.1 F (36.7 C) (Oral)   Resp 18   Ht 5\' 11"  (1.803 m)   Wt 74.2 kg (163 lb 9.3 oz)   SpO2 98%   BMI 22.81 kg/m   Intake/Output Summary (Last 24 hours) at 10/18/2017 1109 Last data filed at 10/18/2017 0953 Gross per 24 hour  Intake 423 ml  Output 1175 ml  Net -752 ml   Intake/Output: I/O last 3 completed shifts: In: 1140 [P.O.:1140] Out: 2925 [Urine:2925]  Intake/Output this shift:  Total I/O In: 123 [P.O.:120; I.V.:3] Out: -  Weight change: 0.1 kg (3.5 oz) Gen: frail, cachectic WM who appears fatigued CVS: no rub Resp:scattered rhonchi bilaterally Abd: benign Ext: no edema  Recent Labs  Lab 10/15/17 1353 10/15/17 1808 10/16/17 0513 10/16/17 1558 10/17/17 0457 10/17/17 1203 10/18/17 0556  NA 123* 125* 126* 123* 125* 127* 126*  K 4.0 3.8 3.9 3.9 3.9 3.7 3.7  CL 90* 93* 95* 93* 94* 97* 97*  CO2 24 23 23 23 22 24 22   GLUCOSE 95 118* 94 112* 92 111* 82  BUN 13 15 10 13 12 9 11   CREATININE 0.64 0.75 0.58* 0.60* 0.52* 0.62 0.54*  ALBUMIN  --   --   --   --   --  2.6* 2.6*  CALCIUM 8.3* 8.3* 8.4* 8.1* 8.4* 8.1* 8.4*  PHOS  --   --   --   --   --  3.9 3.8   Liver Function Tests: Recent Labs  Lab 10/17/17 1203 10/18/17 0556  ALBUMIN 2.6* 2.6*   No results for input(s): LIPASE, AMYLASE in the last 168 hours. No results for input(s): AMMONIA in the last 168 hours. CBC: Recent Labs  Lab 10/14/17 1845 10/15/17 0524 10/16/17 0513 10/18/17 0556  WBC 12.7* 8.5 9.6 8.4  NEUTROABS 7.9*  --   --   --   HGB 9.9* 9.5* 9.3* 8.8*  HCT 29.6* 27.9* 27.4* 26.7*  MCV 75.3* 74.2* 73.7* 75.6*  PLT 139* 136* 201 184   Cardiac Enzymes: Recent Labs  Lab 10/15/17 0048 10/15/17 0524 10/15/17 1113  TROPONINI <0.03 <0.03 <0.03   CBG: No results for input(s): GLUCAP in the last 168 hours.  Iron Studies: No results for  input(s): IRON, TIBC, TRANSFERRIN, FERRITIN in the last 72 hours. Studies/Results: Ct Chest W Contrast  Result Date: 10/18/2017 CLINICAL DATA:  Chronic chest pain with hypotension.  Cough. EXAM: CT CHEST WITH CONTRAST TECHNIQUE: Multidetector CT imaging of the chest was performed during intravenous contrast administration. CONTRAST:  75 mL ISOVUE-300 IOPAMIDOL (ISOVUE-300) INJECTION 61% COMPARISON:  Chest CT March 01, 2007; chest radiograph October 14, 2017 FINDINGS: Cardiovascular: There is no thoracic aortic aneurysm or dissection. There are scattered foci of calcification in the visualized great vessels, most notably in the proximal left subclavian artery. There are foci of calcification in the thoracic aorta. There are multiple foci of coronary artery calcification. There is no appreciable pericardial effusion or thickening. There is no major vessel pulmonary embolus. Mediastinum/Nodes: Thyroid appears normal. There are scattered subcentimeter axillary and mediastinal lymph nodes. There is a lymph node in the right hilum measuring 1.4 x 1.3 cm. There is a sub- carinal lymph node measuring 1.8 x 1.1 cm. No esophageal lesions are appreciable. Lungs/Pleura: There is extensive underlying centrilobular emphysematous change. There are multiple bullae throughout  the upper lobes bilaterally. There is interstitial fibrotic change in the posterior segment of the right upper lobe as well as to a lesser degree in the posterior segment of the left upper lobe. There is upper lobe bronchiectatic change bilaterally, more pronounced on the right than on the left. There is fibrosis in portions of the superior and lateral segments of the right lower lobe. There is lower lobe bronchiectatic change bilaterally, more severe on the right than on the left. There is bibasilar atelectatic change. There is mild scarring in the right middle lobe and lingular regions as well as in both lower lobes more centrally. On axial slice 91  series 4, there is a 5 mm nodular lesion in the superior segment of the left lower lobe. There is no edema or consolidation. There is no appreciable pleural effusion. Upper Abdomen: Visualized upper abdominal structures appear normal except for calcification in the aorta and proximal major mesenteric arterial vessels. There is marked calcification at the celiac axis without frank occlusion of this vessel. Musculoskeletal: There is degenerative change in the thoracic spine with diffuse idiopathic skeletal hyperostosis. IMPRESSION: 1. Extensive underlying emphysematous change with bullae in both upper lobes, more on the right than on the left. There is bronchiectatic change bilaterally, more severe on the right than on the left. Areas of fibrosis noted in the posterior segments of both upper lobes, more on the right than on the left as well as in portions of the right lower lobe. 2.  Bibasilar atelectasis.  No edema or consolidation. 3. 5 mm nodular opacity left lower lobe. No follow-up needed if patient is low-risk. Non-contrast chest CT can be considered in 12 months if patient is high-risk. This recommendation follows the consensus statement: Guidelines for Management of Incidental Pulmonary Nodules Detected on CT Images: From the Fleischner Society 2017; Radiology 2017; 284:228-243. 4. Mildly prominent right hilar and sub- carinal lymph nodes. Suspect reactive etiology given the changes in the lungs. 5. Aortic and major mesenteric/great vessel atherosclerotic calcification. There are foci of coronary artery calcification. Aortic Atherosclerosis (ICD10-I70.0) and Emphysema (ICD10-J43.9). Electronically Signed   By: Lowella Grip III M.D.   On: 10/18/2017 09:04   . aspirin  81 mg Oral Daily  . atorvastatin  80 mg Oral q1800  . carvedilol  6.25 mg Oral BID WC  . clopidogrel  75 mg Oral Q breakfast  . [START ON 10/19/2017] cosyntropin  0.25 mg Intravenous Once  . ferrous sulfate  325 mg Oral BID WC  .  folic acid  1 mg Oral Daily  . furosemide  20 mg Oral Daily  . heparin  5,000 Units Subcutaneous Q8H  . isosorbide mononitrate  30 mg Oral Daily  . multivitamin with minerals  1 tablet Oral Daily  . nicotine  14 mg Transdermal Daily  . sodium chloride flush  3 mL Intravenous Q12H  . sodium chloride  1 g Oral BID WC  . thiamine  100 mg Oral Daily  . vitamin B-12  100 mcg Oral Daily    BMET    Component Value Date/Time   NA 126 (L) 10/18/2017 0556   NA 135 (L) 08/30/2012 1348   K 3.7 10/18/2017 0556   K 4.5 08/30/2012 1348   CL 97 (L) 10/18/2017 0556   CL 103 08/30/2012 1348   CO2 22 10/18/2017 0556   CO2 23 08/30/2012 1348   GLUCOSE 82 10/18/2017 0556   GLUCOSE 107 (H) 08/30/2012 1348   BUN 11 10/18/2017 0556  BUN 10.0 08/30/2012 1348   CREATININE 0.54 (L) 10/18/2017 0556   CREATININE 0.8 08/30/2012 1348   CALCIUM 8.4 (L) 10/18/2017 0556   CALCIUM 9.3 08/30/2012 1348   GFRNONAA >60 10/18/2017 0556   GFRAA >60 10/18/2017 0556   CBC    Component Value Date/Time   WBC 8.4 10/18/2017 0556   RBC 3.53 (L) 10/18/2017 0556   HGB 8.8 (L) 10/18/2017 0556   HGB 12.5 (L) 08/30/2012 1348   HCT 26.7 (L) 10/18/2017 0556   HCT 37.4 (L) 08/30/2012 1348   PLT 184 10/18/2017 0556   PLT 68 (L) 08/30/2012 1348   MCV 75.6 (L) 10/18/2017 0556   MCV 91.2 08/30/2012 1348   MCH 24.9 (L) 10/18/2017 0556   MCHC 33.0 10/18/2017 0556   RDW 17.5 (H) 10/18/2017 0556   RDW 16.0 (H) 08/30/2012 1348   LYMPHSABS 3.3 10/14/2017 1845   LYMPHSABS 3.3 08/30/2012 1348   MONOABS 1.2 (H) 10/14/2017 1845   MONOABS 0.9 08/30/2012 1348   EOSABS 0.2 10/14/2017 1845   EOSABS 0.2 08/30/2012 1348   BASOSABS 0.1 10/14/2017 1845   BASOSABS 0.0 08/30/2012 1348    Assessment/Plan: 1.  Hyponatremia- chronic but lower than baseline.  Uosm down to 271, UNa 42, Sosm 268, cortisol remains low.  Recommend cosyntropin stim test and will need workup for possible malignancy, ie. Chest ct given long smoking history  and copd.  Otherwise, sodium levels are improving with fluid restriction and adequate nutrition.   1. Will start po lasix and agree with stopping elavil. 2. CT of chest with 3.5 mm nodule in left lower lobe 2. Inoperable CAD- CP free 3. Anemia- microcytic, iron and B12 deficient, on supplements.  Again would perform malignancy workup appropriate for his age.   1. SPEP/UPEP pending 2. guaiac stools, and follow H/H.  4. COPD 5. CHF 6. Low cortisol- possible adrenal insufficiency cosyntropin stim test ordered today. 7. Deconditioning- per primiary svc 8. Disposition- will need SNF placement.    Donetta Potts, MD [employer] [phone]

## 2017-10-19 LAB — RENAL FUNCTION PANEL
ALBUMIN: 2.4 g/dL — AB (ref 3.5–5.0)
ANION GAP: 7 (ref 5–15)
BUN: 9 mg/dL (ref 6–20)
CALCIUM: 8.4 mg/dL — AB (ref 8.9–10.3)
CO2: 23 mmol/L (ref 22–32)
Chloride: 98 mmol/L — ABNORMAL LOW (ref 101–111)
Creatinine, Ser: 0.7 mg/dL (ref 0.61–1.24)
GFR calc Af Amer: >60 mL/min (ref >60)
Glucose, Bld: 115 mg/dL — ABNORMAL HIGH (ref 65–99)
PHOSPHORUS: 3.8 mg/dL (ref 2.5–4.6)
POTASSIUM: 3.9 mmol/L (ref 3.5–5.1)
SODIUM: 128 mmol/L — AB (ref 135–145)

## 2017-10-19 LAB — CBC
HEMATOCRIT: 26.9 % — AB (ref 39.0–52.0)
Hemoglobin: 8.8 g/dL — ABNORMAL LOW (ref 13.0–17.0)
MCH: 24.5 pg — ABNORMAL LOW (ref 26.0–34.0)
MCHC: 32.7 g/dL (ref 30.0–36.0)
MCV: 74.9 fL — ABNORMAL LOW (ref 78.0–100.0)
Platelets: 198 10*3/uL (ref 150–400)
RBC: 3.59 MIL/uL — ABNORMAL LOW (ref 4.22–5.81)
RDW: 17.3 % — AB (ref 11.5–15.5)
WBC: 8.4 10*3/uL (ref 4.0–10.5)

## 2017-10-19 LAB — ACTH STIMULATION, 3 TIME POINTS
CORTISOL 30 MIN: 24.6 ug/dL
Cortisol, 60 Min: 30.6 ug/dL
Cortisol, Base: 10 ug/dL

## 2017-10-19 LAB — KAPPA/LAMBDA LIGHT CHAINS
Kappa free light chain: 23.7 mg/L — ABNORMAL HIGH (ref 3.3–19.4)
Kappa, lambda light chain ratio: 1.27 (ref 0.26–1.65)
Lambda free light chains: 18.6 mg/L (ref 5.7–26.3)

## 2017-10-19 LAB — IMMUNOFIXATION, URINE

## 2017-10-19 MED ORDER — PRO-STAT SUGAR FREE PO LIQD
30.0000 mL | Freq: Three times a day (TID) | ORAL | Status: DC
  Administered 2017-10-19 – 2017-10-20 (×3): 30 mL via ORAL
  Filled 2017-10-19 (×3): qty 30

## 2017-10-19 NOTE — Progress Notes (Addendum)
Physical Therapy Treatment Patient Details Name: Vincent Dunlap MRN: [id number] DOB: Sep 11, 1941 Today's Date: 10/19/2017    History of Present Illness Patient is a 76 y/o male admitted due to chest pain.  PMH positive for HTN, hyperlipidemia, COPD, GERD, depression, CHF w/ EF 30-35%, CAD, chronic thrombocytopenia.     PT Comments    PTA returned from recliner back to bed.  Pt remains to require mod assistance from seated to standing position.  Plan for continued transfer training next session.     Follow Up Recommendations  SNF;Supervision/Assistance - 24 hour     Equipment Recommendations  None recommended by PT    Recommendations for Other Services       Precautions / Restrictions Precautions Precautions: Fall Restrictions Weight Bearing Restrictions: No    Mobility  Bed Mobility Overal bed mobility: Needs Assistance Bed Mobility: Sit to Supine     Supine to sit: Mod assist Sit to supine: Min assist   General bed mobility comments: Assist to lift B LEs back into bed and reposition to [facility].    Transfers Overall transfer level: Needs assistance Equipment used: None;1 person hand held assist Transfers: Sit to/from [facility] Sit to Stand: Mod assist Stand pivot transfers: Mod assist       General transfer comment: Cues for hand placement to and from seated surface.  Pt with L foot sliding forward.  Pt required increased assist for upper trunk control.    Ambulation/Gait Ambulation/Gait assistance: Min assist(for shuffling steps from chair back to bed.  )           General Gait Details: Deferred as pt reports he does not walk at baseline.    Stairs            Wheelchair Mobility    Modified Rankin (Stroke Patients Only)       Balance     Sitting balance-Leahy Scale: Poor       Standing balance-Leahy Scale: Poor                              Cognition Arousal/Alertness: Awake/alert Behavior During Therapy:  WFL for tasks assessed/performed Overall Cognitive Status: Within Functional Limits for tasks assessed                                           General Comments        Pertinent Vitals/Pain Pain Assessment: 0-10 Faces Pain Scale: Hurts even more Pain Location: feet with weight bearing Pain Descriptors / Indicators: Tender    Home Living                      Prior Function            PT Goals (current goals can now be found in the care plan section) Acute Rehab PT Goals Patient Stated Goal: To get some help Potential to Achieve Goals: Fair Progress towards PT goals: Progressing toward goals    Frequency    Min 3X/week      PT Plan Current plan remains appropriate    Co-evaluation              AM-PAC PT "6 Clicks" Daily Activity  Outcome Measure  Difficulty turning over in bed (including adjusting bedclothes, sheets and blankets)?: A Little Difficulty moving from lying on  back to sitting on the side of the bed? : Unable Difficulty sitting down on and standing up from a chair with arms (e.g., wheelchair, bedside commode, etc,.)?: Unable Help needed moving to and from a bed to chair (including a wheelchair)?: A Lot Help needed walking in hospital room?: Total Help needed climbing 3-5 steps with a railing? : Total 6 Click Score: 8    End of Session Equipment Utilized During Treatment: Gait belt Activity Tolerance: Patient limited by fatigue Patient left: in chair;with call bell/phone within reach;with chair alarm set Nurse Communication: Mobility status PT Visit Diagnosis: Unsteadiness on feet (R26.81);Muscle weakness (generalized) (M62.81)     Time: 1500-1510 PT Time Calculation (min) (ACUTE ONLY): 10 min  Charges:  $Therapeutic Activity: 8-22 mins                    G CodesGovernor Rooks, PTA pager [phone]    Cristela Blue 10/19/2017, 5:16 PM

## 2017-10-19 NOTE — Progress Notes (Signed)
Initial Nutrition Assessment  DOCUMENTATION CODES:   Non-severe (moderate) malnutrition in context of social or environmental circumstances  INTERVENTION:   -MVI daily -30 ml Prostat TID, each supplement provides 100 kcals and 15 grams protein  NUTRITION DIAGNOSIS:   Moderate Malnutrition related to social / environmental circumstances as evidenced by energy intake < 75% for > or equal to 3 months, mild fat depletion, mild muscle depletion, moderate muscle depletion.  GOAL:   Patient will meet greater than or equal to 90% of their needs  MONITOR:   PO intake, Supplement acceptance, Labs, Weight trends, Skin, I & O's  REASON FOR ASSESSMENT:   Consult Assessment of nutrition requirement/status, Poor PO  ASSESSMENT:   This 76 year old man with coronary artery disease who has triple-vessel disease deemed not a candidate for CABG due to comorbidities and frailty, history of heart failure with most recent echo revealing improvement of ejection fraction to 55%, current every day smoker, COPD, peripheral arterial disease, and poor social living situation presenting to the emergency department with chest pain.  Pt admitted with chest pain due to CAD.   Spoke with pt at bedside. He reports generally feeling unwell and "being stuck like a pin cushion". PTA pt reports he would consume 1 meal per day from [employer] and supplement with 1-2 peanut butter sandwiches throughout the day to consume additional protein. He would occasionally have friends being him food as well. Given diet recall, suspect pt is consuming between approximately 1100 kcals and 33 grams of protein daily (about 56% of estimated energy needs and 31% of estimated protein needs). He has a very good appetite currently (consuming 100% of meals); pt states "I haven't missed a meal yet since I've been here".   Pt denies any weight loss, but admits "I haven't checked it in a long time. Noted UBW around 155#. Edema may be  masking true wt changes. Pt also reports no changes in mobility, but uses a walker as a result of a distant knee replacement.   Pt with no BM since 10/14/17. Noted miralax and senekot ordered on 10/18/17.  Discussed importance of good meal and supplement intake to support healing.  Medications reviewed and include folic acid, ferrous sulfate, MVI, vitamin B-1, vitamin B-12.   Labs reviewed: Na: 126.   NUTRITION - FOCUSED PHYSICAL EXAM:    Most Recent Value  Orbital Region  Mild depletion  Upper Arm Region  Mild depletion  Thoracic and Lumbar Region  No depletion  Buccal Region  Mild depletion  Temple Region  Mild depletion  Clavicle Bone Region  No depletion  Clavicle and Acromion Bone Region  No depletion  Scapular Bone Region  Mild depletion  Dorsal Hand  Moderate depletion  Patellar Region  Moderate depletion  Anterior Thigh Region  Moderate depletion  Posterior Calf Region  Moderate depletion  Edema (RD Assessment)  Mild  Hair  Reviewed  Eyes  Reviewed  Mouth  Reviewed  Skin  Reviewed  Nails  Reviewed       Diet Order:  Diet regular Room service appropriate? Yes; Fluid consistency: Thin; Fluid restriction: Other (see comments)  EDUCATION NEEDS:   Education needs have been addressed  Skin:  Skin Assessment: Skin Integrity Issues: Skin Integrity Issues:: Other (Comment) Other: chronic non-healing rt elbow wounds (infectious vs auto immune- gout/arthritis)  Last BM:  10/14/17  Height:   Ht Readings from Last 1 Encounters:  10/14/17 5\' 11"  (1.803 m)    Weight:   Wt Readings from  Last 1 Encounters:  10/19/17 165 lb 12.6 oz (75.2 kg)    Ideal Body Weight:  78.2 kg  BMI:  Body mass index is 23.12 kg/m.  Estimated Nutritional Needs:   Kcal:  1900-2100  Protein:  105-120 grams  Fluid:  1.9-2.1 L    Marck Mcclenny A. Jimmye Norman, RD, LDN, CDE Pager: [phone] After hours Pager: [phone]

## 2017-10-19 NOTE — Progress Notes (Signed)
Per phlebotomy, ACTH stimulation study moved to 0800 (day shift) due to staffing availability. Phlebotomy would not be able to draw blood prior to and exactly 30 minutes after medication administration during night shift. Will continue to monitor.

## 2017-10-19 NOTE — Consult Note (Signed)
WOC consult for right elbow.  This was already performed on 12/8; refer to previous progress notes for assessment and plan of care. Please re-consult if further assistance is needed.  Thank-you,  Julien Girt MSN, [place], [place], [place], [place]

## 2017-10-19 NOTE — Progress Notes (Addendum)
Pt with cellulitis to left arm.  Pt also reports constant 7/10 pain in both feet.Will continue to monitor.

## 2017-10-19 NOTE — Care Management Important Message (Signed)
Important Message  Patient Details  Name: Vincent Dunlap MRN: [id number] Date of Birth: Sep 23, 1941   Medicare Important Message Given:  Yes    Marquice Uddin 10/19/2017, 1:09 PM

## 2017-10-19 NOTE — Progress Notes (Signed)
Patient ID: Vincent Dunlap, male   DOB: 13-May-1941, 76 y.o.   MRN: [id number] S: Feels "rough" today O:BP (!) 104/54 (BP Location: Right Arm)   Pulse 73   Temp 97.8 F (36.6 C) (Oral)   Resp 20   Ht 5\' 11"  (1.803 m)   Wt 75.2 kg (165 lb 12.6 oz)   SpO2 96%   BMI 23.12 kg/m   Intake/Output Summary (Last 24 hours) at 10/19/2017 1201 Last data filed at 10/19/2017 5784 Gross per 24 hour  Intake 1640 ml  Output 2450 ml  Net -810 ml   Intake/Output: I/O last 3 completed shifts: In: 363 [P.O.:360; I.V.:3] Out: 3250 [Urine:3250]  Intake/Output this shift:  Total I/O In: 1520 [P.O.:1520] Out: -  Weight change: 1 kg (2 lb 3.3 oz) Gen: NAD CVS: no rub Resp: occ rhonchi bilaterally Abd: benign Ext:no edema  Recent Labs  Lab 10/15/17 1808 10/16/17 0513 10/16/17 1558 10/17/17 0457 10/17/17 1203 10/18/17 0556 10/19/17 0950  NA 125* 126* 123* 125* 127* 126* 128*  K 3.8 3.9 3.9 3.9 3.7 3.7 3.9  CL 93* 95* 93* 94* 97* 97* 98*  CO2 23 23 23 22 24 22 23   GLUCOSE 118* 94 112* 92 111* 82 115*  BUN 15 10 13 12 9 11 9   CREATININE 0.75 0.58* 0.60* 0.52* 0.62 0.54* 0.70  ALBUMIN  --   --   --   --  2.6* 2.6* 2.4*  CALCIUM 8.3* 8.4* 8.1* 8.4* 8.1* 8.4* 8.4*  PHOS  --   --   --   --  3.9 3.8 3.8   Liver Function Tests: Recent Labs  Lab 10/17/17 1203 10/18/17 0556 10/19/17 0950  ALBUMIN 2.6* 2.6* 2.4*   No results for input(s): LIPASE, AMYLASE in the last 168 hours. No results for input(s): AMMONIA in the last 168 hours. CBC: Recent Labs  Lab 10/14/17 1845 10/15/17 0524 10/16/17 0513 10/18/17 0556  WBC 12.7* 8.5 9.6 8.4  NEUTROABS 7.9*  --   --   --   HGB 9.9* 9.5* 9.3* 8.8*  HCT 29.6* 27.9* 27.4* 26.7*  MCV 75.3* 74.2* 73.7* 75.6*  PLT 139* 136* 201 184   Cardiac Enzymes: Recent Labs  Lab 10/15/17 0048 10/15/17 0524 10/15/17 1113  TROPONINI <0.03 <0.03 <0.03   CBG: No results for input(s): GLUCAP in the last 168 hours.  Iron Studies: No results for input(s):  IRON, TIBC, TRANSFERRIN, FERRITIN in the last 72 hours. Studies/Results: Ct Chest W Contrast  Result Date: 10/18/2017 CLINICAL DATA:  Chronic chest pain with hypotension.  Cough. EXAM: CT CHEST WITH CONTRAST TECHNIQUE: Multidetector CT imaging of the chest was performed during intravenous contrast administration. CONTRAST:  75 mL ISOVUE-300 IOPAMIDOL (ISOVUE-300) INJECTION 61% COMPARISON:  Chest CT March 01, 2007; chest radiograph October 14, 2017 FINDINGS: Cardiovascular: There is no thoracic aortic aneurysm or dissection. There are scattered foci of calcification in the visualized great vessels, most notably in the proximal left subclavian artery. There are foci of calcification in the thoracic aorta. There are multiple foci of coronary artery calcification. There is no appreciable pericardial effusion or thickening. There is no major vessel pulmonary embolus. Mediastinum/Nodes: Thyroid appears normal. There are scattered subcentimeter axillary and mediastinal lymph nodes. There is a lymph node in the right hilum measuring 1.4 x 1.3 cm. There is a sub- carinal lymph node measuring 1.8 x 1.1 cm. No esophageal lesions are appreciable. Lungs/Pleura: There is extensive underlying centrilobular emphysematous change. There are multiple bullae throughout the upper lobes  bilaterally. There is interstitial fibrotic change in the posterior segment of the right upper lobe as well as to a lesser degree in the posterior segment of the left upper lobe. There is upper lobe bronchiectatic change bilaterally, more pronounced on the right than on the left. There is fibrosis in portions of the superior and lateral segments of the right lower lobe. There is lower lobe bronchiectatic change bilaterally, more severe on the right than on the left. There is bibasilar atelectatic change. There is mild scarring in the right middle lobe and lingular regions as well as in both lower lobes more centrally. On axial slice 91 series 4,  there is a 5 mm nodular lesion in the superior segment of the left lower lobe. There is no edema or consolidation. There is no appreciable pleural effusion. Upper Abdomen: Visualized upper abdominal structures appear normal except for calcification in the aorta and proximal major mesenteric arterial vessels. There is marked calcification at the celiac axis without frank occlusion of this vessel. Musculoskeletal: There is degenerative change in the thoracic spine with diffuse idiopathic skeletal hyperostosis. IMPRESSION: 1. Extensive underlying emphysematous change with bullae in both upper lobes, more on the right than on the left. There is bronchiectatic change bilaterally, more severe on the right than on the left. Areas of fibrosis noted in the posterior segments of both upper lobes, more on the right than on the left as well as in portions of the right lower lobe. 2.  Bibasilar atelectasis.  No edema or consolidation. 3. 5 mm nodular opacity left lower lobe. No follow-up needed if patient is low-risk. Non-contrast chest CT can be considered in 12 months if patient is high-risk. This recommendation follows the consensus statement: Guidelines for Management of Incidental Pulmonary Nodules Detected on CT Images: From the Fleischner Society 2017; Radiology 2017; 284:228-243. 4. Mildly prominent right hilar and sub- carinal lymph nodes. Suspect reactive etiology given the changes in the lungs. 5. Aortic and major mesenteric/great vessel atherosclerotic calcification. There are foci of coronary artery calcification. Aortic Atherosclerosis (ICD10-I70.0) and Emphysema (ICD10-J43.9). Electronically Signed   By: Lowella Grip III M.D.   On: 10/18/2017 09:04   . aspirin  81 mg Oral Daily  . atorvastatin  80 mg Oral q1800  . carvedilol  6.25 mg Oral BID WC  . clopidogrel  75 mg Oral Q breakfast  . ferrous sulfate  325 mg Oral BID WC  . folic acid  1 mg Oral Daily  . furosemide  20 mg Oral Daily  . heparin   5,000 Units Subcutaneous Q8H  . isosorbide mononitrate  30 mg Oral Daily  . multivitamin with minerals  1 tablet Oral Daily  . nicotine  14 mg Transdermal Daily  . polyethylene glycol  17 g Oral Daily  . senna  1 tablet Oral Daily  . sodium chloride flush  3 mL Intravenous Q12H  . sodium chloride  1 g Oral BID WC  . thiamine  100 mg Oral Daily  . vitamin B-12  100 mcg Oral Daily    BMET    Component Value Date/Time   NA 128 (L) 10/19/2017 0950   NA 135 (L) 08/30/2012 1348   K 3.9 10/19/2017 0950   K 4.5 08/30/2012 1348   CL 98 (L) 10/19/2017 0950   CL 103 08/30/2012 1348   CO2 23 10/19/2017 0950   CO2 23 08/30/2012 1348   GLUCOSE 115 (H) 10/19/2017 0950   GLUCOSE 107 (H) 08/30/2012 1348   BUN 9  10/19/2017 0950   BUN 10.0 08/30/2012 1348   CREATININE 0.70 10/19/2017 0950   CREATININE 0.8 08/30/2012 1348   CALCIUM 8.4 (L) 10/19/2017 0950   CALCIUM 9.3 08/30/2012 1348   GFRNONAA >60 10/19/2017 0950   GFRAA >60 10/19/2017 0950   CBC    Component Value Date/Time   WBC 8.4 10/18/2017 0556   RBC 3.53 (L) 10/18/2017 0556   HGB 8.8 (L) 10/18/2017 0556   HGB 12.5 (L) 08/30/2012 1348   HCT 26.7 (L) 10/18/2017 0556   HCT 37.4 (L) 08/30/2012 1348   PLT 184 10/18/2017 0556   PLT 68 (L) 08/30/2012 1348   MCV 75.6 (L) 10/18/2017 0556   MCV 91.2 08/30/2012 1348   MCH 24.9 (L) 10/18/2017 0556   MCHC 33.0 10/18/2017 0556   RDW 17.5 (H) 10/18/2017 0556   RDW 16.0 (H) 08/30/2012 1348   LYMPHSABS 3.3 10/14/2017 1845   LYMPHSABS 3.3 08/30/2012 1348   MONOABS 1.2 (H) 10/14/2017 1845   MONOABS 0.9 08/30/2012 1348   EOSABS 0.2 10/14/2017 1845   EOSABS 0.2 08/30/2012 1348   BASOSABS 0.1 10/14/2017 1845   BASOSABS 0.0 08/30/2012 1348     Assessment/Plan: 1. Hyponatremia- chronic but lower than baseline. Uosm down to 271, UNa 42, Sosm 268, cortisol remains low. DDx Adrenal insufficiency, SIADH, or reset osmostat.  Otherwise, sodium levels are improving with fluid restriction,  lasix, and adequate nutrition.  1. Started po lasix and agree with stopping elavil. 2. CT of chest with 3.5 mm nodule in left lower lobe 3. Cosyntropin stim test pending 2. Inoperable CAD- CP free 3. Anemia- microcytic, iron and B12 deficient, on supplements. Again would perform malignancy workup appropriate for his age.  1. SPEP/UPEP pending 2. guaiac stools, and follow H/H.  4. COPD 5. CHF 6. Low cortisol- possible adrenal insufficiency cosyntropin stim test completed today, awaiting results. 7. Deconditioning- per primiary svc 8. Disposition- will need SNF placement.    Donetta Potts, MD [employer] [phone]

## 2017-10-19 NOTE — Progress Notes (Signed)
Physical Therapy Treatment Patient Details Name: Vincent Dunlap MRN: [id number] DOB: 1941/07/25 Today's Date: 10/19/2017    History of Present Illness Patient is a 76 y/o male admitted due to chest pain.  PMH positive for HTN, hyperlipidemia, COPD, GERD, depression, CHF w/ EF 30-35%, CAD, chronic thrombocytopenia.     PT Comments    Pt performed increased activity during session and performed seated exercises.  Plan next session for continued functional training and exercises.  Pt agreeable to sit in recliner until 3pm as he is concerned he will be stuck in the chair.  Pt continue to require SNF placement at d/c.      Follow Up Recommendations  SNF;Supervision/Assistance - 24 hour     Equipment Recommendations  None recommended by PT    Recommendations for Other Services       Precautions / Restrictions Precautions Precautions: Fall Restrictions Weight Bearing Restrictions: No    Mobility  Bed Mobility Overal bed mobility: Needs Assistance Bed Mobility: Supine to Sit     Supine to sit: Mod assist     General bed mobility comments: Pt required increased assist to elevate trunk into sitting.  Assist to advance LEs to edge of bed.    Transfers Overall transfer level: Needs assistance Equipment used: None;1 person hand held assist Transfers: Sit to/from [facility] Sit to Stand: Mod assist Stand pivot transfers: Mod assist       General transfer comment: Cues for hand placement to and from seated surface.  Pt with L foot sliding forward.  Pt required increased assist for upper trunk control.    Ambulation/Gait Ambulation/Gait assistance: Min assist           General Gait Details: Deferred as pt reports he does not walk at baseline.    Stairs            Wheelchair Mobility    Modified Rankin (Stroke Patients Only)       Balance     Sitting balance-Leahy Scale: Poor       Standing balance-Leahy Scale: Poor                               Cognition Arousal/Alertness: Awake/alert Behavior During Therapy: WFL for tasks assessed/performed Overall Cognitive Status: Within Functional Limits for tasks assessed                                        Exercises Total Joint Exercises Towel Squeeze: AROM;Both;10 reps;Seated General Exercises - Lower Extremity Long Arc Quad: AROM;Both;10 reps;Seated Straight Leg Raises: AROM;Both;5 reps;Supine Hip Flexion/Marching: AROM;Both;10 reps;Seated    General Comments        Pertinent Vitals/Pain Pain Assessment: No/denies pain    Home Living                      Prior Function            PT Goals (current goals can now be found in the care plan section) Acute Rehab PT Goals Patient Stated Goal: To get some help Potential to Achieve Goals: Fair Progress towards PT goals: Progressing toward goals    Frequency    Min 3X/week      PT Plan Current plan remains appropriate    Co-evaluation  AM-PAC PT "6 Clicks" Daily Activity  Outcome Measure  Difficulty turning over in bed (including adjusting bedclothes, sheets and blankets)?: A Little Difficulty moving from lying on back to sitting on the side of the bed? : Unable Difficulty sitting down on and standing up from a chair with arms (e.g., wheelchair, bedside commode, etc,.)?: Unable Help needed moving to and from a bed to chair (including a wheelchair)?: A Lot Help needed walking in hospital room?: Total Help needed climbing 3-5 steps with a railing? : Total 6 Click Score: 9    End of Session Equipment Utilized During Treatment: Gait belt Activity Tolerance: Patient limited by fatigue Patient left: in chair;with call bell/phone within reach;with chair alarm set Nurse Communication: Mobility status PT Visit Diagnosis: Unsteadiness on feet (R26.81);Muscle weakness (generalized) (M62.81)     Time: 3524-8185 PT Time Calculation (min) (ACUTE ONLY):  21 min  Charges:  $Therapeutic Activity: 8-22 mins                    G Codes:       Governor Rooks, PTA pager [phone]    Cristela Blue 10/19/2017, 2:03 PM

## 2017-10-19 NOTE — Progress Notes (Signed)
PROGRESS NOTE  Vincent Dunlap OXB:[id number] DOB: 1941-11-02 DOA: 10/14/2017 PCP: Thressa Sheller, MD   LOS: 4 days   Brief Narrative / Interim history: This 76 year old man with coronary artery diseasewho has triple-vessel disease deemed not a candidate for CABG due to comorbidities and frailty, history of heart failure with most recent echo revealing improvement of ejection fraction to 55%, current every day smoker, COPD, peripheral arterial disease, and poor social living situation presenting to the emergency department with chest pain. Patient reports living [place], he is a retired Building control surveyor, he reports needing help with his ADLs. His roommate moved out 4-5 days prior to admission and he has been a difficult position. He receives one meal a day for Meals on Wheels and otherwise snacks or whatever is available. He reports adequate by mouth intake as far as fluids ago. He reports drinking alcohol at times up to 6-7 beers a day, last consumed that amount 2 weeks ago according to his report. He did having difficulty getting out of his chair, citing that when he does he becomes dizzy and lightheaded. On day of admission he developed central chest pain that lasts for 30 minutes, described as sharp, exacerbated by emotional stress. He took a sublingual nitroglycerin and was taken to the hospital for further evaluation. 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 He was started on sodium tablet and water restriction. Hyponatremia no improving. Nephrology consulted. consynthropin test ordered. Plan to start lasix today. Plan to be transfer to SNF when hyponatremia improved.   Assessment & Plan: Active Problems:   CAD (coronary artery disease), native coronary artery   Chronic systolic CHF (congestive heart failure) (HCC)   COPD (chronic obstructive pulmonary disease) (HCC)   Chest pain   Hyponatremia   Chest pain in setting of CAD, prior HF (now preserved EF), and peripheral  arterial disease -Chest pain free.  -Continue with Imdur, aspirin and plavix.  -Troponin negative.   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 TSH 2.6, urine osmolality 328. Adrenal insufficiency ruled out, 30 min cortisol 24.6 -CT chest 3. 5 pulmonary nodule. Need CT follow up.  -Nephrology consulted, appreciate input, sodium is improving today at 128  COPD  -nebulizer PRN  Functional decline / poor living situation  -needs SNF, patient agreeable, SW consulted  Anemia -microcytic: iron deficiency. Started  iron supplement  -B 12 low, started  supplement.   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     DVT prophylaxis: heparin Code Status: Full code Family Communication: no family at bedside Disposition Plan: SNF 1-2 days   Consultants:   Nephrology   Procedures:   None   Antimicrobials:  None    Subjective: - no chest pain, shortness of breath, no abdominal pain, nausea or vomiting.   Objective: Vitals:   10/19/17 0106 10/19/17 0515 10/19/17 0847 10/19/17 1140  BP:  123/62 (!) 95/37 (!) 104/54  Pulse: 69 87 92 73  Resp: 18 18  20   Temp:  98 F (36.7 C)  97.8 F (36.6 C)  TempSrc: Oral Oral  Oral  SpO2:  98% 96% 96%  Weight:  75.2 kg (165 lb 12.6 oz)    Height:        Intake/Output Summary (Last 24 hours) at 10/19/2017 1258 Last data filed at 10/19/2017 1253 Gross per 24 hour  Intake 1643 ml  Output 3250 ml  Net -1607 ml   Filed Weights   10/17/17 0253 10/18/17 0500 10/19/17 0515  Weight: 74.1 kg (163 lb 5.8 oz) 74.2 kg (163 lb 9.3 oz) 75.2 kg (165 lb 12.6 oz)    Examination:  Constitutional: NAD Eyes: lids and conjunctivae normal ENMT: Mucous membranes are moist. Respiratory: clear to auscultation bilaterally, no wheezing, no crackles.  Cardiovascular: Regular rate and rhythm, no murmurs / rubs / gallops. No LE  edema. Abdomen: no tenderness. Bowel sounds positive.  Musculoskeletal: no clubbing / cyanosis.  Skin: no rashes, lesions, ulcers. No induration Neurologic: non focal   Data Reviewed: I have independently reviewed following labs and imaging studies   CBC: Recent Labs  Lab 10/14/17 1845 10/15/17 0524 10/16/17 0513 10/18/17 0556 10/19/17 0950  WBC 12.7* 8.5 9.6 8.4 8.4  NEUTROABS 7.9*  --   --   --   --   HGB 9.9* 9.5* 9.3* 8.8* 8.8*  HCT 29.6* 27.9* 27.4* 26.7* 26.9*  MCV 75.3* 74.2* 73.7* 75.6* 74.9*  PLT 139* 136* 201 184 916   Basic Metabolic Panel: Recent Labs  Lab 10/16/17 1558 10/17/17 0457 10/17/17 1203 10/18/17 0556 10/19/17 0950  NA 123* 125* 127* 126* 128*  K 3.9 3.9 3.7 3.7 3.9  CL 93* 94* 97* 97* 98*  CO2 23 22 24 22 23   GLUCOSE 112* 92 111* 82 115*  BUN 13 12 9 11 9   CREATININE 0.60* 0.52* 0.62 0.54* 0.70  CALCIUM 8.1* 8.4* 8.1* 8.4* 8.4*  PHOS  --   --  3.9 3.8 3.8   GFR: Estimated Creatinine Clearance: 83.6 mL/min (by C-G formula based on SCr of 0.7 mg/dL). Liver Function Tests: Recent Labs  Lab 10/17/17 1203 10/18/17 0556 10/19/17 0950  ALBUMIN 2.6* 2.6* 2.4*   No results for input(s): LIPASE, AMYLASE in the last 168 hours. No results for input(s): AMMONIA in the last 168 hours. Coagulation Profile: No results for input(s): INR, PROTIME in the last 168 hours. Cardiac Enzymes: Recent Labs  Lab 10/15/17 0048 10/15/17 0524 10/15/17 1113  TROPONINI <0.03 <0.03 <0.03   BNP (last 3 results) No results for input(s): PROBNP in the last 8760 hours. HbA1C: No results for input(s): HGBA1C in the last 72 hours. CBG: No results for input(s): GLUCAP in the last 168 hours. Lipid Profile: No results for input(s): CHOL, HDL, LDLCALC, TRIG, CHOLHDL, LDLDIRECT in the last 72 hours. Thyroid Function Tests: No results for input(s): TSH, T4TOTAL, FREET4, T3FREE, THYROIDAB in the last 72 hours. Anemia Panel: No results for input(s): VITAMINB12,  FOLATE, FERRITIN, TIBC, IRON, RETICCTPCT in the last 72 hours. Urine analysis:    Component Value Date/Time   COLORURINE YELLOW 07/16/2017 2330   APPEARANCEUR HAZY (A) 07/16/2017 2330   LABSPEC 1.015 07/16/2017 2330   PHURINE 5.0 07/16/2017 2330   GLUCOSEU NEGATIVE 07/16/2017 2330   HGBUR SMALL (A) 07/16/2017 2330   BILIRUBINUR NEGATIVE 07/16/2017 2330   KETONESUR 5 (A) 07/16/2017 2330   PROTEINUR 30 (A) 07/16/2017 2330   UROBILINOGEN 1.0 07/29/2009 0002   NITRITE NEGATIVE 07/16/2017 2330   LEUKOCYTESUR MODERATE (A) 07/16/2017 2330   Sepsis Labs: Invalid input(s): PROCALCITONIN, LACTICIDVEN  No results found for this or any previous visit (from the past 240 hour(s)).    Radiology Studies: Ct Chest W Contrast  Result Date: 10/18/2017 CLINICAL DATA:  Chronic chest pain with hypotension.  Cough. EXAM: CT CHEST WITH CONTRAST TECHNIQUE: Multidetector CT imaging of the chest was performed during intravenous contrast administration. CONTRAST:  75 mL ISOVUE-300 IOPAMIDOL (ISOVUE-300)  INJECTION 61% COMPARISON:  Chest CT March 01, 2007; chest radiograph October 14, 2017 FINDINGS: Cardiovascular: There is no thoracic aortic aneurysm or dissection. There are scattered foci of calcification in the visualized great vessels, most notably in the proximal left subclavian artery. There are foci of calcification in the thoracic aorta. There are multiple foci of coronary artery calcification. There is no appreciable pericardial effusion or thickening. There is no major vessel pulmonary embolus. Mediastinum/Nodes: Thyroid appears normal. There are scattered subcentimeter axillary and mediastinal lymph nodes. There is a lymph node in the right hilum measuring 1.4 x 1.3 cm. There is a sub- carinal lymph node measuring 1.8 x 1.1 cm. No esophageal lesions are appreciable. Lungs/Pleura: There is extensive underlying centrilobular emphysematous change. There are multiple bullae throughout the upper lobes  bilaterally. There is interstitial fibrotic change in the posterior segment of the right upper lobe as well as to a lesser degree in the posterior segment of the left upper lobe. There is upper lobe bronchiectatic change bilaterally, more pronounced on the right than on the left. There is fibrosis in portions of the superior and lateral segments of the right lower lobe. There is lower lobe bronchiectatic change bilaterally, more severe on the right than on the left. There is bibasilar atelectatic change. There is mild scarring in the right middle lobe and lingular regions as well as in both lower lobes more centrally. On axial slice 91 series 4, there is a 5 mm nodular lesion in the superior segment of the left lower lobe. There is no edema or consolidation. There is no appreciable pleural effusion. Upper Abdomen: Visualized upper abdominal structures appear normal except for calcification in the aorta and proximal major mesenteric arterial vessels. There is marked calcification at the celiac axis without frank occlusion of this vessel. Musculoskeletal: There is degenerative change in the thoracic spine with diffuse idiopathic skeletal hyperostosis. IMPRESSION: 1. Extensive underlying emphysematous change with bullae in both upper lobes, more on the right than on the left. There is bronchiectatic change bilaterally, more severe on the right than on the left. Areas of fibrosis noted in the posterior segments of both upper lobes, more on the right than on the left as well as in portions of the right lower lobe. 2.  Bibasilar atelectasis.  No edema or consolidation. 3. 5 mm nodular opacity left lower lobe. No follow-up needed if patient is low-risk. Non-contrast chest CT can be considered in 12 months if patient is high-risk. This recommendation follows the consensus statement: Guidelines for Management of Incidental Pulmonary Nodules Detected on CT Images: From the Fleischner Society 2017; Radiology 2017; 284:228-243.  4. Mildly prominent right hilar and sub- carinal lymph nodes. Suspect reactive etiology given the changes in the lungs. 5. Aortic and major mesenteric/great vessel atherosclerotic calcification. There are foci of coronary artery calcification. Aortic Atherosclerosis (ICD10-I70.0) and Emphysema (ICD10-J43.9). Electronically Signed   By: Lowella Grip III M.D.   On: 10/18/2017 09:04     Scheduled Meds: . aspirin  81 mg Oral Daily  . atorvastatin  80 mg Oral q1800  . carvedilol  6.25 mg Oral BID WC  . clopidogrel  75 mg Oral Q breakfast  . ferrous sulfate  325 mg Oral BID WC  . folic acid  1 mg Oral Daily  . furosemide  20 mg Oral Daily  . heparin  5,000 Units Subcutaneous Q8H  . isosorbide mononitrate  30 mg Oral Daily  . multivitamin with minerals  1 tablet Oral Daily  .  nicotine  14 mg Transdermal Daily  . polyethylene glycol  17 g Oral Daily  . senna  1 tablet Oral Daily  . sodium chloride flush  3 mL Intravenous Q12H  . sodium chloride  1 g Oral BID WC  . thiamine  100 mg Oral Daily  . vitamin B-12  100 mcg Oral Daily   Continuous Infusions:   Marzetta Board, MD, PhD [facility] Pager [phone]  If 7PM-7AM, please contact night-coverage [email or web address] Password [facility] 10/19/2017, 12:58 PM

## 2017-10-20 DIAGNOSIS — E871 Hypo-osmolality and hyponatremia: Secondary | ICD-10-CM | POA: Diagnosis not present

## 2017-10-20 DIAGNOSIS — R531 Weakness: Secondary | ICD-10-CM | POA: Diagnosis not present

## 2017-10-20 DIAGNOSIS — J80 Acute respiratory distress syndrome: Secondary | ICD-10-CM | POA: Diagnosis not present

## 2017-10-20 DIAGNOSIS — I25118 Atherosclerotic heart disease of native coronary artery with other forms of angina pectoris: Secondary | ICD-10-CM

## 2017-10-20 DIAGNOSIS — G894 Chronic pain syndrome: Secondary | ICD-10-CM | POA: Diagnosis not present

## 2017-10-20 DIAGNOSIS — I25110 Atherosclerotic heart disease of native coronary artery with unstable angina pectoris: Secondary | ICD-10-CM | POA: Diagnosis not present

## 2017-10-20 DIAGNOSIS — L988 Other specified disorders of the skin and subcutaneous tissue: Secondary | ICD-10-CM | POA: Diagnosis not present

## 2017-10-20 DIAGNOSIS — R079 Chest pain, unspecified: Secondary | ICD-10-CM | POA: Diagnosis not present

## 2017-10-20 DIAGNOSIS — Z4781 Encounter for orthopedic aftercare following surgical amputation: Secondary | ICD-10-CM | POA: Diagnosis not present

## 2017-10-20 DIAGNOSIS — R911 Solitary pulmonary nodule: Secondary | ICD-10-CM | POA: Diagnosis not present

## 2017-10-20 DIAGNOSIS — Z7982 Long term (current) use of aspirin: Secondary | ICD-10-CM | POA: Diagnosis not present

## 2017-10-20 DIAGNOSIS — I5030 Unspecified diastolic (congestive) heart failure: Secondary | ICD-10-CM | POA: Diagnosis not present

## 2017-10-20 DIAGNOSIS — G6289 Other specified polyneuropathies: Secondary | ICD-10-CM | POA: Diagnosis not present

## 2017-10-20 DIAGNOSIS — D649 Anemia, unspecified: Secondary | ICD-10-CM | POA: Diagnosis not present

## 2017-10-20 DIAGNOSIS — D519 Vitamin B12 deficiency anemia, unspecified: Secondary | ICD-10-CM | POA: Diagnosis not present

## 2017-10-20 DIAGNOSIS — M6281 Muscle weakness (generalized): Secondary | ICD-10-CM | POA: Diagnosis not present

## 2017-10-20 DIAGNOSIS — L89613 Pressure ulcer of right heel, stage 3: Secondary | ICD-10-CM | POA: Diagnosis not present

## 2017-10-20 DIAGNOSIS — G629 Polyneuropathy, unspecified: Secondary | ICD-10-CM | POA: Diagnosis not present

## 2017-10-20 DIAGNOSIS — I5022 Chronic systolic (congestive) heart failure: Secondary | ICD-10-CM | POA: Diagnosis not present

## 2017-10-20 DIAGNOSIS — R9389 Abnormal findings on diagnostic imaging of other specified body structures: Secondary | ICD-10-CM | POA: Diagnosis not present

## 2017-10-20 DIAGNOSIS — R202 Paresthesia of skin: Secondary | ICD-10-CM | POA: Diagnosis not present

## 2017-10-20 DIAGNOSIS — J849 Interstitial pulmonary disease, unspecified: Secondary | ICD-10-CM | POA: Diagnosis not present

## 2017-10-20 DIAGNOSIS — Z23 Encounter for immunization: Secondary | ICD-10-CM | POA: Diagnosis not present

## 2017-10-20 DIAGNOSIS — I5040 Unspecified combined systolic (congestive) and diastolic (congestive) heart failure: Secondary | ICD-10-CM | POA: Diagnosis not present

## 2017-10-20 DIAGNOSIS — R627 Adult failure to thrive: Secondary | ICD-10-CM | POA: Diagnosis not present

## 2017-10-20 DIAGNOSIS — M86671 Other chronic osteomyelitis, right ankle and foot: Secondary | ICD-10-CM | POA: Diagnosis not present

## 2017-10-20 DIAGNOSIS — J449 Chronic obstructive pulmonary disease, unspecified: Secondary | ICD-10-CM | POA: Diagnosis not present

## 2017-10-20 DIAGNOSIS — E870 Hyperosmolality and hypernatremia: Secondary | ICD-10-CM | POA: Diagnosis not present

## 2017-10-20 DIAGNOSIS — R2681 Unsteadiness on feet: Secondary | ICD-10-CM | POA: Diagnosis not present

## 2017-10-20 DIAGNOSIS — D509 Iron deficiency anemia, unspecified: Secondary | ICD-10-CM | POA: Diagnosis not present

## 2017-10-20 DIAGNOSIS — N390 Urinary tract infection, site not specified: Secondary | ICD-10-CM | POA: Diagnosis not present

## 2017-10-20 DIAGNOSIS — A419 Sepsis, unspecified organism: Secondary | ICD-10-CM | POA: Diagnosis not present

## 2017-10-20 DIAGNOSIS — M1A0210 Idiopathic chronic gout, right elbow, without tophus (tophi): Secondary | ICD-10-CM | POA: Diagnosis not present

## 2017-10-20 DIAGNOSIS — R0789 Other chest pain: Secondary | ICD-10-CM | POA: Diagnosis not present

## 2017-10-20 DIAGNOSIS — X58XXXA Exposure to other specified factors, initial encounter: Secondary | ICD-10-CM | POA: Diagnosis not present

## 2017-10-20 DIAGNOSIS — I739 Peripheral vascular disease, unspecified: Secondary | ICD-10-CM | POA: Diagnosis not present

## 2017-10-20 DIAGNOSIS — R488 Other symbolic dysfunctions: Secondary | ICD-10-CM | POA: Diagnosis not present

## 2017-10-20 DIAGNOSIS — I2510 Atherosclerotic heart disease of native coronary artery without angina pectoris: Secondary | ICD-10-CM | POA: Diagnosis not present

## 2017-10-20 LAB — RENAL FUNCTION PANEL
ANION GAP: 9 (ref 5–15)
Albumin: 2.6 g/dL — ABNORMAL LOW (ref 3.5–5.0)
BUN: 12 mg/dL (ref 6–20)
CHLORIDE: 98 mmol/L — AB (ref 101–111)
CO2: 22 mmol/L (ref 22–32)
Calcium: 8.7 mg/dL — ABNORMAL LOW (ref 8.9–10.3)
Creatinine, Ser: 0.52 mg/dL — ABNORMAL LOW (ref 0.61–1.24)
Glucose, Bld: 92 mg/dL (ref 65–99)
POTASSIUM: 3.8 mmol/L (ref 3.5–5.1)
Phosphorus: 3.8 mg/dL (ref 2.5–4.6)
Sodium: 129 mmol/L — ABNORMAL LOW (ref 135–145)

## 2017-10-20 MED ORDER — FERROUS SULFATE 325 (65 FE) MG PO TABS: 325.0000 mg | ORAL_TABLET | Freq: Two times a day (BID) | ORAL | 3 refills | Status: DC

## 2017-10-20 MED ORDER — OXYCODONE HCL 5 MG PO TABS: 5.0000 mg | ORAL_TABLET | Freq: Three times a day (TID) | ORAL | 0 refills | Status: DC | PRN

## 2017-10-20 MED ORDER — FOLIC ACID 1 MG PO TABS: 1.0000 mg | ORAL_TABLET | Freq: Every day | ORAL | Status: DC

## 2017-10-20 MED ORDER — CYANOCOBALAMIN 100 MCG PO TABS: 100.0000 ug | ORAL_TABLET | Freq: Every day | ORAL | Status: DC

## 2017-10-20 MED ORDER — FUROSEMIDE 20 MG PO TABS: 20.0000 mg | ORAL_TABLET | Freq: Every day | ORAL | Status: DC

## 2017-10-20 MED ORDER — THIAMINE HCL 100 MG PO TABS: 100.0000 mg | ORAL_TABLET | Freq: Every day | ORAL | Status: DC

## 2017-10-20 NOTE — Progress Notes (Signed)
Patient ID: JERAMYAH GOODPASTURE, male   DOB: 01/24/1941, 76 y.o.   MRN: [id number] S:No complaints O:BP (!) 127/56 (BP Location: Right Arm)   Pulse 89   Temp 97.6 F (36.4 C) (Oral)   Resp 18   Ht 5\' 11"  (1.803 m)   Wt 71.4 kg (157 lb 8 oz)   SpO2 100%   BMI 21.97 kg/m   Intake/Output Summary (Last 24 hours) at 10/20/2017 1159 Last data filed at 10/20/2017 0445 Gross per 24 hour  Intake 623 ml  Output 2600 ml  Net -1977 ml   Intake/Output: I/O last 3 completed shifts: In: 379 [P.O.:860; I.V.:3] Out: 3750 [Urine:3750]  Intake/Output this shift:  No intake/output data recorded. Weight change: -3.758 kg (-4.6 oz) Gen: NAD CVS: no rub Resp: scattered rhonchi Abd: benign Ext:no edema  Recent Labs  Lab 10/16/17 0513 10/16/17 1558 10/17/17 0457 10/17/17 1203 10/18/17 0556 10/19/17 0950 10/20/17 0604  NA 126* 123* 125* 127* 126* 128* 129*  K 3.9 3.9 3.9 3.7 3.7 3.9 3.8  CL 95* 93* 94* 97* 97* 98* 98*  CO2 23 23 22 24 22 23 22   GLUCOSE 94 112* 92 111* 82 115* 92  BUN 10 13 12 9 11 9 12   CREATININE 0.58* 0.60* 0.52* 0.62 0.54* 0.70 0.52*  ALBUMIN  --   --   --  2.6* 2.6* 2.4* 2.6*  CALCIUM 8.4* 8.1* 8.4* 8.1* 8.4* 8.4* 8.7*  PHOS  --   --   --  3.9 3.8 3.8 3.8   Liver Function Tests: Recent Labs  Lab 10/18/17 0556 10/19/17 0950 10/20/17 0604  ALBUMIN 2.6* 2.4* 2.6*   No results for input(s): LIPASE, AMYLASE in the last 168 hours. No results for input(s): AMMONIA in the last 168 hours. CBC: Recent Labs  Lab 10/14/17 1845 10/15/17 0524 10/16/17 0513 10/18/17 0556 10/19/17 0950  WBC 12.7* 8.5 9.6 8.4 8.4  NEUTROABS 7.9*  --   --   --   --   HGB 9.9* 9.5* 9.3* 8.8* 8.8*  HCT 29.6* 27.9* 27.4* 26.7* 26.9*  MCV 75.3* 74.2* 73.7* 75.6* 74.9*  PLT 139* 136* 201 184 198   Cardiac Enzymes: Recent Labs  Lab 10/15/17 0048 10/15/17 0524 10/15/17 1113  TROPONINI <0.03 <0.03 <0.03   CBG: No results for input(s): GLUCAP in the last 168 hours.  Iron Studies: No  results for input(s): IRON, TIBC, TRANSFERRIN, FERRITIN in the last 72 hours. Studies/Results: No results found. Marland Kitchen aspirin  81 mg Oral Daily  . atorvastatin  80 mg Oral q1800  . carvedilol  6.25 mg Oral BID WC  . clopidogrel  75 mg Oral Q breakfast  . feeding supplement (PRO-STAT SUGAR FREE 64)  30 mL Oral TID BM  . ferrous sulfate  325 mg Oral BID WC  . folic acid  1 mg Oral Daily  . furosemide  20 mg Oral Daily  . heparin  5,000 Units Subcutaneous Q8H  . isosorbide mononitrate  30 mg Oral Daily  . multivitamin with minerals  1 tablet Oral Daily  . nicotine  14 mg Transdermal Daily  . polyethylene glycol  17 g Oral Daily  . senna  1 tablet Oral Daily  . sodium chloride flush  3 mL Intravenous Q12H  . sodium chloride  1 g Oral BID WC  . thiamine  100 mg Oral Daily  . vitamin B-12  100 mcg Oral Daily    BMET    Component Value Date/Time   NA 129 (L)  10/20/2017 0604   NA 135 (L) 08/30/2012 1348   K 3.8 10/20/2017 0604   K 4.5 08/30/2012 1348   CL 98 (L) 10/20/2017 0604   CL 103 08/30/2012 1348   CO2 22 10/20/2017 0604   CO2 23 08/30/2012 1348   GLUCOSE 92 10/20/2017 0604   GLUCOSE 107 (H) 08/30/2012 1348   BUN 12 10/20/2017 0604   BUN 10.0 08/30/2012 1348   CREATININE 0.52 (L) 10/20/2017 0604   CREATININE 0.8 08/30/2012 1348   CALCIUM 8.7 (L) 10/20/2017 0604   CALCIUM 9.3 08/30/2012 1348   GFRNONAA >60 10/20/2017 0604   GFRAA >60 10/20/2017 0604   CBC    Component Value Date/Time   WBC 8.4 10/19/2017 0950   RBC 3.59 (L) 10/19/2017 0950   HGB 8.8 (L) 10/19/2017 0950   HGB 12.5 (L) 08/30/2012 1348   HCT 26.9 (L) 10/19/2017 0950   HCT 37.4 (L) 08/30/2012 1348   PLT 198 10/19/2017 0950   PLT 68 (L) 08/30/2012 1348   MCV 74.9 (L) 10/19/2017 0950   MCV 91.2 08/30/2012 1348   MCH 24.5 (L) 10/19/2017 0950   MCHC 32.7 10/19/2017 0950   RDW 17.3 (H) 10/19/2017 0950   RDW 16.0 (H) 08/30/2012 1348   LYMPHSABS 3.3 10/14/2017 1845   LYMPHSABS 3.3 08/30/2012 1348    MONOABS 1.2 (H) 10/14/2017 1845   MONOABS 0.9 08/30/2012 1348   EOSABS 0.2 10/14/2017 1845   EOSABS 0.2 08/30/2012 1348   BASOSABS 0.1 10/14/2017 1845   BASOSABS 0.0 08/30/2012 1348     Assessment/Plan: 1. Hyponatremia- chronic but lower than baseline. Uosm down to 271, UNa 42, Sosm 268, cortisol remains low. DDx Adrenal insufficiency, SIADH, or reset osmostat.  Otherwise, sodium levels are improving with fluid restriction, lasix, and adequate nutrition. 1. Started polasix and agree withstopping elavil. 2. CT of chest with 3.5 mm nodule in left lower lobe 3. Cosyntropin stim test with normal increase in cortisol 4. Continue with lasix and will need to have labs followed at his SNF.  Will sign off, nothing further to add. 2. Inoperable CAD- CP free 3. Anemia- microcytic, iron and B12 deficient, on supplements. Again would perform malignancy workup appropriate for his age.  1. UPEP normal, SPEPpending 2. guaiac stools, and follow H/H.  4. COPD 5. CHF 6. Low cortisol- possible adrenal insufficiency cosyntropin stim testcompleted today, awaiting results. 7. Deconditioning- per primiary svc 8. Disposition- SNF placement.   Donetta Potts, MD [employer] [phone]

## 2017-10-20 NOTE — Clinical Social Work Note (Signed)
CSW facilitated patient discharge including contacting facility to confirm patient discharge plans. Patient declined to have CSW contact supports. Clinical information faxed to facility and family agreeable with plan. CSW arranged ambulance transport via [facility] to [place] at 2:00 pm. RN to call report prior to discharge [phone] Room 120).  CSW will sign off for now as social work intervention is no longer needed. Please consult [place] again if new needs arise.  Dayton Scrape, [place]

## 2017-10-20 NOTE — Discharge Summary (Signed)
Physician Discharge Summary  Vincent Dunlap OEU:[id number] DOB: 06-08-1941 DOA: 10/14/2017  PCP: Thressa Sheller, MD  Admit date: 10/14/2017 Discharge date: 10/20/2017  Admitted From: home Disposition:  SNF  Recommendations for Outpatient Follow-up:  1. Follow up with PCP in 1-2 weeks 2. Please obtain BMP/CBC in one week 3. Continue Lasix   Home Health: none Equipment/Devices: none  Discharge Condition: stable CODE STATUS: Full code Diet recommendation: heart healthy  HPI: Per Dr. Stana Bunting, This 76 year old man with coronary artery disease who has triple-vessel disease deemed not a candidate for CABG due to comorbidities and frailty, history of heart failure with most recent echo revealing improvement of ejection fraction to 55%, current every day smoker, COPD, peripheral arterial disease, and poor social living situation presenting to the emergency department with chest pain. Patient reports living [name], he is a retired Building control surveyor, he reports needing help with his ADLs. His roommate moved out 4-5 days prior to admission and he has been a difficult position. He receives one meal a day for Meals on Wheels and otherwise snacks or whatever is available. He reports adequate by mouth intake as far as fluids ago. He reports drinking alcohol at times up to 6-7 beers a day, last consumed that amount 2 weeks ago according to his report. He did having difficulty getting out of his chair, citing that when he does he becomes dizzy and lightheaded. On day of admission he developed central chest pain that lasts for 30 minutes, described as sharp, exacerbated by emotional stress. He took a sublingual nitroglycerin and was taken to the hospital for further evaluation. Reports some wheezing, no fevers, chills, productive cough. He reports other symptoms including chronic paresthesias of the feet. Cannot recall which medications he takes at home. ED Course: In transit via EMS received aspirin 324 mg, as well  as 2 sublingual nitroglycerin. Vital signs were relatively unremarkable for normal heart rate normal respiratory rate and systolic blood pressure 154 mm Hg.  lab work remarkable for hyponatremia to 125, BNP of 444, white count 12.7, hemoglobin 9.9, MCV 75, platelet count of 139.  Due to his symptomatic improvement in transit he was not given any medications from the emergency department. EKG did not reveal any ST segment elevations.  Chest x-ray did not reveal any active pulmonary disease, as well as chronic interstitial lung disease.    Hospital Course: Discharge Diagnoses:  Active Problems:   CAD (coronary artery disease), native coronary artery   Chronic systolic CHF (congestive heart failure) (HCC)   COPD (chronic obstructive pulmonary disease) (HCC)   Chest pain   Hyponatremia   Chest pain in setting of CAD, prior HF (now preserved EF), and peripheral arterial disease -Chest pain free after admission, he was continued on Imdur, aspirin and plavix. Troponin negative. He was evaluated by cardiology recently with a cardiac catheterization which showed diffuse coronary artery disease, however was not deemed a candidate for CABG and was recommended medical management only Hyponatremia-Acute on chronic, probablySIADH, urine sodium more than 40 and urine osmolality more than 300.  He improved in the hospital with water restriction and Lasix, nephrology was consulted and followed patient while hospitalized.  Sodium 1 discharge was 129 which is close to his baseline, continue Lasix on discharge and would recommend fluid restriction as well.  Would recommend repeat the BNP within a week.  He was ruled out for thyroid disease and renal insufficiency, had a normal TSH and a normal cortisol stim test respectively. Lung nodule - CT chest  incidentally discovered a 3.5 mm pulmonary nodule. Need CT follow up as an outpatient. 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  Wound care consulted and recommended "To prevent infection, we will pain the open lesion with a betadine swabstick once daily and allow to air-dry.  No dressing needed."    Discharge Instructions   Allergies as of 10/20/2017      Reactions   Chantix [varenicline] Other (See Comments)   Pt states it made him "crazy"      Medication List    STOP taking these medications   amitriptyline 25 MG tablet Commonly known as:  ELAVIL   amLODipine-benazepril 5-20 MG capsule Commonly known as:  LOTREL     TAKE these medications   acetaminophen 325 MG tablet Commonly known as:  TYLENOL Take 2 tablets (650 mg total) by mouth every 4 (four) hours as needed for headache or mild pain.   aspirin 81 MG chewable tablet Chew 1 tablet (81 mg total) by mouth daily.   atorvastatin 80 MG tablet Commonly known as:  LIPITOR Take 1 tablet (80 mg total) by mouth daily at 6 PM.   bisacodyl 10 MG suppository Commonly known as:  DULCOLAX Place 1 suppository (10 mg total) rectally daily as needed for severe constipation.   carvedilol 6.25 MG tablet Commonly known as:  COREG Take 1 tablet (6.25 mg total) by mouth 2 (two) times daily with a meal.   clopidogrel 75 MG tablet Commonly known as:  PLAVIX Take 1 tablet (75 mg total) by mouth daily with breakfast.   cyanocobalamin 100 MCG tablet Take 1 tablet (100 mcg total) by mouth daily. Start taking on:  10/21/2017   esomeprazole 40 MG capsule Commonly known as:  NEXIUM Take 40 mg by mouth daily at 12 noon.   feeding supplement (ENSURE ENLIVE) Liqd Take 237 mLs by mouth 2 (two) times daily between meals.     ferrous sulfate 325 (65 FE) MG tablet Take 1 tablet (325 mg total) by mouth 2 (two) times daily with a meal.   folic acid 1 MG tablet Commonly known as:  FOLVITE Take 1 tablet (1 mg total) by mouth daily. Start taking on:  10/21/2017   furosemide 20 MG tablet Commonly known as:  LASIX Take 1 tablet (20 mg total) by mouth daily. Start taking on:  10/21/2017   isosorbide mononitrate 30 MG 24 hr tablet Commonly known as:  IMDUR Take 1 tablet (30 mg total) by mouth daily.   lisinopril 2.5 MG tablet Commonly known as:  PRINIVIL,ZESTRIL Take 1 tablet (2.5 mg total) by mouth daily.   nicotine 14 mg/24hr patch Commonly known as:  NICODERM CQ - dosed in mg/24 hours Place 1 patch (14 mg total) onto the skin daily.   nitroGLYCERIN 0.4 MG SL tablet Commonly known as:  NITROSTAT Place 1 tablet (0.4 mg total) under the tongue every 5 (five) minutes as needed for chest pain.   oxyCODONE 5 MG immediate release tablet Commonly known as:  ROXICODONE Take 1 tablet (5 mg total) by mouth every 8 (eight) hours as needed for severe pain.   polyethylene glycol packet Commonly known as:  MIRALAX /  GLYCOLAX Take 17 g by mouth 2 (two) times daily.   spironolactone 25 MG tablet Commonly known as:  ALDACTONE Take 25 mg by mouth daily.   thiamine 100 MG tablet Take 1 tablet (100 mg total) by mouth daily. Start taking on:  10/21/2017      Contact information for after-discharge care    Destination    [facility] .   Service:  Skilled Nursing Contact information: [address] [place] [phone]              Consultations:  Nephrology  Procedures/Studies:  2D echo   Ct Chest W Contrast  Result Date: 10/18/2017 CLINICAL DATA:  Chronic chest pain with hypotension.  Cough. EXAM: CT CHEST WITH CONTRAST TECHNIQUE: Multidetector CT imaging of the chest was performed during intravenous contrast administration. CONTRAST:  75  mL ISOVUE-300 IOPAMIDOL (ISOVUE-300) INJECTION 61% COMPARISON:  Chest CT March 01, 2007; chest radiograph October 14, 2017 FINDINGS: Cardiovascular: There is no thoracic aortic aneurysm or dissection. There are scattered foci of calcification in the visualized great vessels, most notably in the proximal left subclavian artery. There are foci of calcification in the thoracic aorta. There are multiple foci of coronary artery calcification. There is no appreciable pericardial effusion or thickening. There is no major vessel pulmonary embolus. Mediastinum/Nodes: Thyroid appears normal. There are scattered subcentimeter axillary and mediastinal lymph nodes. There is a lymph node in the right hilum measuring 1.4 x 1.3 cm. There is a sub- carinal lymph node measuring 1.8 x 1.1 cm. No esophageal lesions are appreciable. Lungs/Pleura: There is extensive underlying centrilobular emphysematous change. There are multiple bullae throughout the upper lobes bilaterally. There is interstitial fibrotic change in the posterior segment of the right upper lobe as well as to a lesser degree in the posterior segment of the left upper lobe. There is upper lobe bronchiectatic change bilaterally, more pronounced on the right than on the left. There is fibrosis in portions of the superior and lateral segments of the right lower lobe. There is lower lobe bronchiectatic change bilaterally, more severe on the right than on the left. There is bibasilar atelectatic change. There is mild scarring in the right middle lobe and lingular regions as well as in both lower lobes more centrally. On axial slice 91 series 4, there is a 5 mm nodular lesion in the superior segment of the left lower lobe. There is no edema or consolidation. There is no appreciable pleural effusion. Upper Abdomen: Visualized upper abdominal structures appear normal except for calcification in the aorta and proximal major mesenteric arterial vessels. There is marked  calcification at the celiac axis without frank occlusion of this vessel. Musculoskeletal: There is degenerative change in the thoracic spine with diffuse idiopathic skeletal hyperostosis. IMPRESSION: 1. Extensive underlying emphysematous change with bullae in both upper lobes, more on the right than on the left. There is bronchiectatic change bilaterally, more severe on the right than on the left. Areas of fibrosis noted in the posterior segments of both upper lobes, more on the right than on the left as well as in portions of the right lower lobe. 2.  Bibasilar atelectasis.  No edema or consolidation. 3. 5 mm nodular opacity left lower lobe. No follow-up needed if patient is low-risk. Non-contrast chest CT can be considered in 12 months if patient is high-risk. This recommendation follows the consensus statement: Guidelines for Management of Incidental Pulmonary Nodules Detected on CT Images: From the Fleischner Society 2017;  Radiology 2017; 284:228-243. 4. Mildly prominent right hilar and sub- carinal lymph nodes. Suspect reactive etiology given the changes in the lungs. 5. Aortic and major mesenteric/great vessel atherosclerotic calcification. There are foci of coronary artery calcification. Aortic Atherosclerosis (ICD10-I70.0) and Emphysema (ICD10-J43.9). Electronically Signed   By: Lowella Grip III M.D.   On: 10/18/2017 09:04   Dg Chest Port 1 View  Result Date: 10/14/2017 CLINICAL DATA:  Chest pain EXAM: PORTABLE CHEST 1 VIEW COMPARISON:  07/16/2017 and 03/01/2007 FINDINGS: The heart size and mediastinal contours are within normal limits. Aortic atherosclerosis without aneurysm. Pulmonary hyperinflation is noted, upper lobe predominant likely on the basis of mild emphysematous change. Crowding of lower lobe interstitial lung markings. Tiny granuloma in the right lower lobe appears stable dating back to 2008. Mild interstitial prominence that may reflect chronic interstitial fibrosis is re-  demonstrated. No alveolar consolidations. The visualized skeletal structures are unremarkable. IMPRESSION: 1. 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 2. Bilateral upper lobe predominant hyperinflation of the lungs. 3. Aortic atherosclerosis. 4. No active pulmonary disease. Electronically Signed   By: Ashley Royalty M.D.   On: 10/14/2017 16:28     Subjective: - no chest pain, shortness of breath, no abdominal pain, nausea or vomiting.  Complains of neuropathic pain in both legs for years.  Discharge Exam: Vitals:   10/19/17 2041 10/20/17 0434  BP: (!) 104/48 (!) 127/56  Pulse: 78 89  Resp: 18 18  Temp: 97.7 F (36.5 C) 97.6 F (36.4 C)  SpO2: 100% 100%    General: Pt is alert, awake, not in acute distress Cardiovascular: RRR, S1/S2 +, no rubs, no gallops Respiratory: CTA bilaterally, no wheezing, no rhonchi Abdominal: Soft, NT, ND, bowel sounds + Extremities: no edema, no cyanosis    The results of significant diagnostics from this hospitalization (including imaging, microbiology, ancillary and laboratory) are listed below for reference.     Microbiology: No results found for this or any previous visit (from the past 240 hour(s)).   Labs: BNP (last 3 results) Recent Labs    07/17/17 0317 10/14/17 1845  BNP 145.4* 932.6*   Basic Metabolic Panel: Recent Labs  Lab 10/17/17 0457 10/17/17 1203 10/18/17 0556 10/19/17 0950 10/20/17 0604  NA 125* 127* 126* 128* 129*  K 3.9 3.7 3.7 3.9 3.8  CL 94* 97* 97* 98* 98*  CO2 22 24 22 23 22   GLUCOSE 92 111* 82 115* 92  BUN 12 9 11 9 12   CREATININE 0.52* 0.62 0.54* 0.70 0.52*  CALCIUM 8.4* 8.1* 8.4* 8.4* 8.7*  PHOS  --  3.9 3.8 3.8 3.8   Liver Function Tests: Recent Labs  Lab 10/17/17 1203 10/18/17 0556 10/19/17 0950 10/20/17 0604  ALBUMIN 2.6* 2.6* 2.4* 2.6*   No results for input(s): LIPASE, AMYLASE in the last 168 hours. No results for input(s): AMMONIA  in the last 168 hours. CBC: Recent Labs  Lab 10/14/17 1845 10/15/17 0524 10/16/17 0513 10/18/17 0556 10/19/17 0950  WBC 12.7* 8.5 9.6 8.4 8.4  NEUTROABS 7.9*  --   --   --   --   HGB 9.9* 9.5* 9.3* 8.8* 8.8*  HCT 29.6* 27.9* 27.4* 26.7* 26.9*  MCV 75.3* 74.2* 73.7* 75.6* 74.9*  PLT 139* 136* 201 184 198   Cardiac Enzymes: Recent Labs  Lab 10/15/17 0048 10/15/17 0524 10/15/17 1113  TROPONINI <0.03 <0.03 <0.03   BNP: Invalid input(s): POCBNP CBG: No results for input(s): GLUCAP in the last  168 hours. D-Dimer No results for input(s): DDIMER in the last 72 hours. Hgb A1c No results for input(s): HGBA1C in the last 72 hours. Lipid Profile No results for input(s): CHOL, HDL, LDLCALC, TRIG, CHOLHDL, LDLDIRECT in the last 72 hours. Thyroid function studies No results for input(s): TSH, T4TOTAL, T3FREE, THYROIDAB in the last 72 hours.  Invalid input(s): FREET3 Anemia work up No results for input(s): VITAMINB12, FOLATE, FERRITIN, TIBC, IRON, RETICCTPCT in the last 72 hours. Urinalysis    Component Value Date/Time   COLORURINE YELLOW 07/16/2017 2330   APPEARANCEUR HAZY (A) 07/16/2017 2330   LABSPEC 1.015 07/16/2017 2330   PHURINE 5.0 07/16/2017 2330   GLUCOSEU NEGATIVE 07/16/2017 2330   HGBUR SMALL (A) 07/16/2017 2330   BILIRUBINUR NEGATIVE 07/16/2017 2330   KETONESUR 5 (A) 07/16/2017 2330   PROTEINUR 30 (A) 07/16/2017 2330   UROBILINOGEN 1.0 07/29/2009 0002   NITRITE NEGATIVE 07/16/2017 2330   LEUKOCYTESUR MODERATE (A) 07/16/2017 2330   Sepsis Labs Invalid input(s): PROCALCITONIN,  WBC,  LACTICIDVEN   Time coordinating discharge: 40 minutes  SIGNED:  Marzetta Board, MD  [facility] 10/20/2017, 10:47 AM Pager [phone]  If 7PM-7AM, please contact night-coverage [email or web address] Password [email or web address]

## 2017-10-20 NOTE — Clinical Social Work Placement (Signed)
   CLINICAL SOCIAL WORK PLACEMENT  NOTE  Date:  10/20/2017  Patient Details  Name: Vincent Dunlap MRN: [id number] Date of Birth: 04/03/1941  Clinical Social Work is seeking post-discharge placement for this patient at the [facility] level of care (*CSW will initial, date and re-position this form in  chart as items are completed):  Yes   Patient/family provided with [facility] Work Department's list of facilities offering this level of care within the geographic area requested by the patient (or if unable, by the patient's family).  Yes   Patient/family informed of their freedom to choose among providers that offer the needed level of care, that participate in Medicare, Medicaid or managed care program needed by the patient, have an available bed and are willing to accept the patient.  Yes   Patient/family informed of [facility]'s ownership interest in [facility] and [facility], as well as of the fact that they are under no obligation to receive care at these facilities.  PASRR submitted to EDS on 10/15/17     PASRR number received on       Existing PASRR number confirmed on 10/15/17     FL2 transmitted to all facilities in geographic area requested by pt/family on 10/15/17     FL2 transmitted to all facilities within larger geographic area on       Patient informed that his/her managed care company has contracts with or will negotiate with certain facilities, including the following:        Yes   Patient/family informed of bed offers received.  Patient chooses bed at [facility] recommends and patient chooses bed at      Patient to be transferred to [facility] on 10/20/17.  Patient to be transferred to facility by [facility]     Patient family notified on 10/20/17 of transfer.  Name of family member notified:  Patient declined for CSW to contact supports.     PHYSICIAN Please prepare  prescriptions     Additional Comment:    _______________________________________________ Candie Chroman, LCSW 10/20/2017, 12:04 PM

## 2017-10-20 NOTE — Progress Notes (Signed)
Called report to [facility]. Pt stable and waiting for transport. Tele removed and IV discharged.

## 2017-10-21 ENCOUNTER — Encounter: Payer: Self-pay | Admitting: Adult Health

## 2017-10-21 ENCOUNTER — Other Ambulatory Visit: Payer: Self-pay

## 2017-10-21 ENCOUNTER — Non-Acute Institutional Stay (SKILLED_NURSING_FACILITY): Payer: Medicare Other | Admitting: Adult Health

## 2017-10-21 DIAGNOSIS — I5022 Chronic systolic (congestive) heart failure: Secondary | ICD-10-CM

## 2017-10-21 DIAGNOSIS — K5901 Slow transit constipation: Secondary | ICD-10-CM | POA: Diagnosis not present

## 2017-10-21 DIAGNOSIS — I25110 Atherosclerotic heart disease of native coronary artery with unstable angina pectoris: Secondary | ICD-10-CM | POA: Diagnosis not present

## 2017-10-21 DIAGNOSIS — D509 Iron deficiency anemia, unspecified: Secondary | ICD-10-CM | POA: Diagnosis not present

## 2017-10-21 DIAGNOSIS — I10 Essential (primary) hypertension: Secondary | ICD-10-CM

## 2017-10-21 DIAGNOSIS — Z720 Tobacco use: Secondary | ICD-10-CM | POA: Diagnosis not present

## 2017-10-21 DIAGNOSIS — R531 Weakness: Secondary | ICD-10-CM

## 2017-10-21 DIAGNOSIS — G894 Chronic pain syndrome: Secondary | ICD-10-CM

## 2017-10-21 DIAGNOSIS — E871 Hypo-osmolality and hyponatremia: Secondary | ICD-10-CM

## 2017-10-21 DIAGNOSIS — R911 Solitary pulmonary nodule: Secondary | ICD-10-CM | POA: Diagnosis not present

## 2017-10-21 LAB — PROTEIN ELECTROPHORESIS, SERUM
A/G Ratio: 0.9 (ref 0.7–1.7)
ALBUMIN ELP: 2.6 g/dL — AB (ref 2.9–4.4)
ALPHA-1-GLOBULIN: 0.3 g/dL (ref 0.0–0.4)
Alpha-2-Globulin: 0.8 g/dL (ref 0.4–1.0)
BETA GLOBULIN: 0.9 g/dL (ref 0.7–1.3)
GAMMA GLOBULIN: 0.8 g/dL (ref 0.4–1.8)
Globulin, Total: 2.8 g/dL (ref 2.2–3.9)
Total Protein ELP: 5.4 g/dL — ABNORMAL LOW (ref 6.0–8.5)

## 2017-10-21 NOTE — Progress Notes (Signed)
Location:  [place] Room Number: 120-A Place of Service:  SNF (31) Provider:  Durenda Age, NP  Patient Care Team: Thressa Sheller, MD as PCP - General (Internal Medicine) Thornell Sartorius, MD as Consulting Physician (Otolaryngology)  Extended Emergency Contact Information Primary Emergency Contact: Glynis Smiles [place] Phone: [phone] Relation: Other Secondary Emergency Contact: Leda Min [place] of [place] Phone: [phone] Mobile Phone: [phone] Relation: Other  Code Status:  Full Code  Goals of care: Advanced Directive information Advanced Directives 10/15/2017  Does Patient Have a Medical Advance Directive? No  Would patient like information on creating a medical advance directive? No - Patient declined     Chief Complaint  Patient presents with  . Acute Visit    Followup on [facility] hospitalization 12/7-12/13/18 for chest pain    HPI: e Pt is a 76 y.o. male seen today for followup of hospitalization at [facility] 12/7-12/13/18 for chest pain. He had taken nitroglycerin and was taken to the hospital. In transit to ED, he was given ASA 324 mg and 2 tabs of sublingual nitroglycerin. No medication was given to him in the ED due to improvement in transit. He was noted to have Na 125 and platelet count 139. EKG did not reveal any ST segment elevations. Chest x-ray was negative for any active disease. He was seen by cardiology. He had a recent cardiac catheterization which showed diffuse coronary artery disease. However, he was not seen candidate for CABG and was recommended medical management only. He was admitted to [facility] on 10/20/17 for short-term rehabilitation.  He has a PMH of CAD of native coronary artery, chronic systolic CHF, COPD, and hyponatremia. He was seen in the room today. He complained of constipation.     Past Medical History:  Diagnosis Date  . CAD  (coronary artery disease) 07/2016   3 vessel disease,  evaluated by Dr Servando Snare and felt to be a poor surgical candidate  . CHF (congestive heart failure) ([place])   . Hypertension   . Ischemic cardiomyopathy 2017   EF 30%  . Tobacco abuse    Past Surgical History:  Procedure Laterality Date  . ABDOMINAL AORTOGRAM W/LOWER EXTREMITY Right 07/26/2017   Procedure: ABDOMINAL AORTOGRAM W/LOWER EXTREMITY;  Surgeon: Serafina Mitchell, MD;  Location: [place] CV LAB;  Service: Cardiovascular;  Laterality: Right;  . AMPUTATION Right 07/28/2017   Procedure: AMPUTATION RIGHT Second TOE;  Surgeon: Waynetta Sandy, MD;  Location: [place];  Service: Vascular;  Laterality: Right;  . CARDIAC CATHETERIZATION N/A 07/19/2016   Procedure: Right/Left Heart Cath and Coronary Angiography;  Surgeon: Troy Sine, MD;  Location: [place] [facility];  Service: Cardiovascular;  Laterality: N/A;  . INGUINAL HERNIA REPAIR Bilateral   . PERIPHERAL VASCULAR INTERVENTION Right 07/26/2017   Procedure: PERIPHERAL VASCULAR INTERVENTION;  Surgeon: Serafina Mitchell, MD;  Location: [place] CV [facility];  Service: Cardiovascular;  Laterality: Right;    Allergies  Allergen Reactions  . Chantix [Varenicline] Other (See Comments)    Pt states it made him "crazy"    Outpatient Encounter Medications as of 10/21/2017  Medication Sig  . acetaminophen (TYLENOL) 325 MG tablet Take 2 tablets (650 mg total) by mouth every 4 (four) hours as needed for headache or mild pain.  Marland Kitchen aspirin 81 MG chewable tablet Chew 1 tablet (81 mg total) by mouth daily.  Marland Kitchen atorvastatin (LIPITOR) 80 MG tablet Take 1 tablet (80 mg total) by mouth daily  at 6 PM.  . bisacodyl (DULCOLAX) 10 MG suppository Place 1 suppository (10 mg total) rectally daily as needed for severe constipation.  . carvedilol (COREG) 6.25 MG tablet Take 1 tablet (6.25 mg total) by mouth 2 (two) times daily with a meal.  . clopidogrel (PLAVIX) 75 MG tablet Take 1 tablet (75 mg  total) by mouth daily with breakfast.  . esomeprazole (NEXIUM) 40 MG capsule Take 40 mg by mouth daily at 12 noon.  . feeding supplement, ENSURE ENLIVE, (ENSURE ENLIVE) LIQD Take 237 mLs by mouth 2 (two) times daily between meals.  . ferrous sulfate 325 (65 FE) MG tablet Take 1 tablet (325 mg total) by mouth 2 (two) times daily with a meal.  . folic acid (FOLVITE) 1 MG tablet Take 1 tablet (1 mg total) by mouth daily.  . furosemide (LASIX) 20 MG tablet Take 1 tablet (20 mg total) by mouth daily.  . isosorbide mononitrate (IMDUR) 30 MG 24 hr tablet Take 1 tablet (30 mg total) by mouth daily.  Marland Kitchen lisinopril (PRINIVIL,ZESTRIL) 2.5 MG tablet Take 1 tablet (2.5 mg total) by mouth daily.  . nicotine (NICODERM CQ - DOSED IN MG/24 HOURS) 14 mg/24hr patch Place 1 patch (14 mg total) onto the skin daily.  . nitroGLYCERIN (NITROSTAT) 0.4 MG SL tablet Place 1 tablet (0.4 mg total) under the tongue every 5 (five) minutes as needed for chest pain.  Marland Kitchen oxyCODONE (ROXICODONE) 5 MG immediate release tablet Take 1 tablet (5 mg total) by mouth every 8 (eight) hours as needed for severe pain.  . polyethylene glycol (MIRALAX / GLYCOLAX) packet Take 17 g by mouth 2 (two) times daily.  Marland Kitchen spironolactone (ALDACTONE) 25 MG tablet Take 25 mg by mouth daily.  Marland Kitchen thiamine 100 MG tablet Take 1 tablet (100 mg total) by mouth daily.  . vitamin B-12 100 MCG tablet Take 1 tablet (100 mcg total) by mouth daily.   No facility-administered encounter medications on file as of 10/21/2017.     Review of Systems  GENERAL: No change in appetite, no fatigue, no weight changes, no fever, chills or weakness MOUTH and THROAT: Denies oral discomfort, gingival pain or bleeding, pain from teeth or hoarseness   RESPIRATORY: no cough, SOB, DOE, wheezing, hemoptysis CARDIAC: No edema or palpitations GI: +constipation GU: Denies dysuria, frequency, hematuria, incontinence, or discharge PSYCHIATRIC: Denies feelings of depression or anxiety. No  report of hallucinations, insomnia, paranoia, or agitation    Immunization History  Administered Date(s) Administered  . Influenza, High Dose Seasonal PF 10/16/2017  . Influenza,inj,Quad PF,6+ Mos 07/25/2016  . Pneumococcal Polysaccharide-23 10/16/2017   Pertinent  Health Maintenance Due  Topic Date Due  . PNA vac Low Risk Adult (2 of 2 - PCV13) 10/16/2018  . INFLUENZA VACCINE  Completed   Fall Risk  08/09/2017  Falls in the past year? Yes  Number falls in past yr: 2 or more  Injury with Fall? Yes  Risk Factor Category  High Fall Risk  Risk for fall due to : Impaired mobility;Impaired balance/gait  Follow up Falls prevention discussed;Falls evaluation completed      Vitals:   10/21/17 0934  BP: 109/61  Pulse: 85  Resp: 20  Temp: (!) 96.7 F (35.9 C)  TempSrc: Oral  SpO2: 95%  Weight: 157 lb 8 oz (71.4 kg)  Height: 5' 11"  (1.803 m)   Body mass index is 21.97 kg/m.  Physical Exam  GENERAL APPEARANCE: Well nourished. In no acute distress. Normal body habitus SKIN:  Right  elbow has an open wound that is yellowish and dry, has a hard mass on rigth forearm near the elbow, white scab on right heel which is tender MOUTH and THROAT: Lips are without lesions. 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 Affect and behavior are appropriate   Labs reviewed: Recent Labs    10/18/17 0556 10/19/17 0950 10/20/17 0604  NA 126* 128* 129*  K 3.7 3.9 3.8  CL 97* 98* 98*  CO2 22 23 22   GLUCOSE 82 115* 92  BUN 11 9 12   CREATININE 0.54* 0.70 0.52*  CALCIUM 8.4* 8.4* 8.7*  PHOS 3.8 3.8 3.8   Recent Labs    10/18/17 0556 10/19/17 0950 10/20/17 0604  ALBUMIN 2.6* 2.4* 2.6*   Recent  Labs    10/14/17 1845  10/16/17 0513 10/18/17 0556 10/19/17 0950  WBC 12.7*   < > 9.6 8.4 8.4  NEUTROABS 7.9*  --   --   --   --   HGB 9.9*   < > 9.3* 8.8* 8.8*  HCT 29.6*   < > 27.4* 26.7* 26.9*  MCV 75.3*   < > 73.7* 75.6* 74.9*  PLT 139*   < > 201 184 198   < > = values in this interval not displayed.   Lab Results  Component Value Date   TSH 2.644 10/15/2017   Lab Results  Component Value Date   HGBA1C 5.1 07/17/2017   Lab Results  Component Value Date   CHOL 81 07/17/2017   HDL 32 (L) 07/17/2017   LDLCALC 38 07/17/2017   TRIG 57 07/17/2017   CHOLHDL 2.5 07/17/2017    Significant Diagnostic Results in last 30 days:  Ct Chest W Contrast  Result Date: 10/18/2017 CLINICAL DATA:  Chronic chest pain with hypotension.  Cough. EXAM: CT CHEST WITH CONTRAST TECHNIQUE: Multidetector CT imaging of the chest was performed during intravenous contrast administration. CONTRAST:  75 mL ISOVUE-300 IOPAMIDOL (ISOVUE-300) INJECTION 61% COMPARISON:  Chest CT March 01, 2007; chest radiograph October 14, 2017 FINDINGS: Cardiovascular: There is no thoracic aortic aneurysm or dissection. There are scattered foci of calcification in the visualized great vessels, most notably in the proximal left subclavian artery. There are foci of calcification in the thoracic aorta. There are multiple foci of coronary artery calcification. There is no appreciable pericardial effusion or thickening. There is no major vessel pulmonary embolus. Mediastinum/Nodes: Thyroid appears normal. There are scattered subcentimeter axillary and mediastinal lymph nodes. There is a lymph node in the right hilum measuring 1.4 x 1.3 cm. There is a sub- carinal lymph node measuring 1.8 x 1.1 cm. No esophageal lesions are appreciable. Lungs/Pleura: There is extensive underlying centrilobular emphysematous change. There are multiple bullae throughout the upper lobes bilaterally. There is interstitial fibrotic change in the posterior  segment of the right upper lobe as well as to a lesser degree in the posterior segment of the left upper lobe. There is upper lobe bronchiectatic change bilaterally, more pronounced on the right than on the left. There is fibrosis in portions of the superior and lateral segments of the right lower lobe. There is lower lobe bronchiectatic change bilaterally, more severe on the right than on the left. There is bibasilar atelectatic  change. There is mild scarring in the right middle lobe and lingular regions as well as in both lower lobes more centrally. On axial slice 91 series 4, there is a 5 mm nodular lesion in the superior segment of the left lower lobe. There is no edema or consolidation. There is no appreciable pleural effusion. Upper Abdomen: Visualized upper abdominal structures appear normal except for calcification in the aorta and proximal major mesenteric arterial vessels. There is marked calcification at the celiac axis without frank occlusion of this vessel. Musculoskeletal: There is degenerative change in the thoracic spine with diffuse idiopathic skeletal hyperostosis. IMPRESSION: 1. Extensive underlying emphysematous change with bullae in both upper lobes, more on the right than on the left. There is bronchiectatic change bilaterally, more severe on the right than on the left. Areas of fibrosis noted in the posterior segments of both upper lobes, more on the right than on the left as well as in portions of the right lower lobe. 2.  Bibasilar atelectasis.  No edema or consolidation. 3. 5 mm nodular opacity left lower lobe. No follow-up needed if patient is low-risk. Non-contrast chest CT can be considered in 12 months if patient is high-risk. This recommendation follows the consensus statement: Guidelines for Management of Incidental Pulmonary Nodules Detected on CT Images: From the Fleischner Society 2017; Radiology 2017; 284:228-243. 4. Mildly prominent right hilar and sub- carinal lymph nodes.  Suspect reactive etiology given the changes in the lungs. 5. Aortic and major mesenteric/great vessel atherosclerotic calcification. There are foci of coronary artery calcification. Aortic Atherosclerosis (ICD10-I70.0) and Emphysema (ICD10-J43.9). Electronically Signed   By: Lowella Grip III M.D.   On: 10/18/2017 09:04   Dg Chest Port 1 View  Result Date: 10/14/2017 CLINICAL DATA:  Chest pain EXAM: PORTABLE CHEST 1 VIEW COMPARISON:  07/16/2017 and 03/01/2007 FINDINGS: The heart size and mediastinal contours are within normal limits. Aortic atherosclerosis without aneurysm. Pulmonary hyperinflation is noted, upper lobe predominant likely on the basis of mild emphysematous change. Crowding of lower lobe interstitial lung markings. Tiny granuloma in the right lower lobe appears stable dating back to 2008. Mild interstitial prominence that may reflect chronic interstitial fibrosis is re- demonstrated. No alveolar consolidations. The visualized skeletal structures are unremarkable. IMPRESSION: 1. 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 2. Bilateral upper lobe predominant hyperinflation of the lungs. 3. Aortic atherosclerosis. 4. No active pulmonary disease. Electronically Signed   By: Ashley Royalty M.D.   On: 10/14/2017 16:28    Assessment/Plan  1. Generalized weakness - for rehabilitation, for PT and OT, for therapeutic strengthening exercises   2. Coronary artery disease involving native coronary artery of native heart with unstable angina pectoris ([place]) - he had chest pain which caused him to go to hospital, cardiology was consulted and recommended medical management, continue NTG PRN, aspirin 81 mg daily, Plavix 75 mg 1 tab daily, isosorbide mononitrate ER 30 mg 1 tab daily, atorvastatin 80 mg 1 tab daily   3. Chronic systolic CHF (congestive heart failure) (HCC) - continue Carvedilol 6.25 mg 1 tab BID, Lasix 20 mg 1 tab daily,  lisinopril 2.5 mg 1 tab daily, Aldactone 25 mg 1 tab daily    4. Lung nodule -  CT chest incidentally discovered a 3.5 mm pulmonary nodule, refer for pulmonology consult   5. Slow transit constipation - continue Bisacodyl 10 mg suppository PRN, MiraLAX 17 g twice a day, and start senna S 2 tabs by mouth daily  at bedtime   6. Chronic pain syndrome - discontinue oxycodone ER 10 mg give 1/2 tab = 5 mg every 8 hours when necessary, start Oxycodone 5 mg IR 1 tab PO Q 8 hours PRN and continue acetaminophen 325 mg give 2 tabs = 650 mg every 4 hours when necessary   7. Essential hypertension -  continue lisinopril 2.5 mg 1 tab daily, Carvedilol 6.25 mg 1 tab BID   8. Iron deficiency anemia, unspecified iron deficiency anemia type - continue FeSO4 325 mg 1 tab BID Lab Results  Component Value Date   HGB 8.8 (L) 10/19/2017    9. Tobacco use - continue NicoDerm CQ 14 mg/24-hour patch 1 patch on to skin daily  10. Hyponatremia - Na 129, will put on fluid restriction 1.5L/day, BMP in 1 week     Family/ staff Communication:  Discussed plan of care with patient and charge nurse.  Labs/tests ordered:  BMP and CBC in 1 week  Goals of care:   Short-term care   Durenda Age, NP [facility] [phone] (Monday-Friday 8:00 a.m. - 5:00 p.m.) [phone] (after hours)

## 2017-10-24 DIAGNOSIS — L988 Other specified disorders of the skin and subcutaneous tissue: Secondary | ICD-10-CM | POA: Diagnosis not present

## 2017-10-25 ENCOUNTER — Other Ambulatory Visit: Payer: Self-pay | Admitting: Licensed Clinical Social Worker

## 2017-10-25 ENCOUNTER — Non-Acute Institutional Stay (SKILLED_NURSING_FACILITY): Payer: Medicare Other | Admitting: Internal Medicine

## 2017-10-25 ENCOUNTER — Encounter: Payer: Self-pay | Admitting: Internal Medicine

## 2017-10-25 DIAGNOSIS — G629 Polyneuropathy, unspecified: Secondary | ICD-10-CM | POA: Insufficient documentation

## 2017-10-25 DIAGNOSIS — G6289 Other specified polyneuropathies: Secondary | ICD-10-CM

## 2017-10-25 DIAGNOSIS — D509 Iron deficiency anemia, unspecified: Secondary | ICD-10-CM | POA: Diagnosis not present

## 2017-10-25 DIAGNOSIS — R627 Adult failure to thrive: Secondary | ICD-10-CM | POA: Diagnosis not present

## 2017-10-25 DIAGNOSIS — J449 Chronic obstructive pulmonary disease, unspecified: Secondary | ICD-10-CM

## 2017-10-25 DIAGNOSIS — I25118 Atherosclerotic heart disease of native coronary artery with other forms of angina pectoris: Secondary | ICD-10-CM | POA: Diagnosis not present

## 2017-10-25 DIAGNOSIS — R9389 Abnormal findings on diagnostic imaging of other specified body structures: Secondary | ICD-10-CM | POA: Diagnosis not present

## 2017-10-25 NOTE — Progress Notes (Signed)
NURSING HOME LOCATION:  [facility] ROOM NUMBER:  120-A  CODE STATUS:  Full Code  PCP:  Thressa Sheller, [address], [place] [id number]    This is a comprehensive admission note to [facility] performed on this date less than 30 days from date of admission. Included are preadmission medical/surgical history; reconciled medication list; family history; social history and comprehensive review of systems.  Corrections and additions to the records were documented.  Comprehensive physical exam was also performed. Additionally a clinical summary was entered for each active diagnosis pertinent to this admission in the Problem List to enhance continuity of care.  HPI: The patient was hospitalized 12/7-12/13/18; he presented to the ED with chest pain. This is in the context of known triple-vessel coronary artery disease. He's not been deemed a candidate for bypass due to multiple medical comorbidities, advanced age, and frailty. The patient has COPD and is a current smoker. He also has peripheral vascular disease.  His poor social living situation compromises his overall health care as well. He reports needing help with ADLs & even difficulty getting out of the chair. He describes symptoms of dizziness and lightheadedness with standing.Marland Kitchen He had been living with a roommate who left approximately 5 days prior to admission. He did receive one meal a day from [employer] but otherwise snacks on whatever is available. He continues to drink alcohol up to 6-7 beers per day but decrease his consumption by his report 2 weeks prior to admission. In the ED sodium was found to be 125,BNP 144, white count 12,700, & hemoglobin 9.9 with MCV 75, MCHC 32.7, and platelet count 139,000. Anemia was attributed to B12 and iron deficiency. Iron supplementation was initiated with B12 supplements. B12 was to be administered IM weekly 3 along with an oral supplement. 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 EKG did not reveal reveal acute STEMI. Chronic interstitial lung disease was found on chest x-ray. He did receive aspirin, nitroglycerin 2 in the ED. He remained pain-free following admission. Recent catheterization had revealed diffuse coronary disease ;and as noted he was not deemed to be a candidate for bypass. Medical management was recommended. 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 Urine sodium was more than 40 and urine osmolality greater than 300. Sodium improved with water restriction and Lasix. These recommendations were to be continued post discharge as per nephrology.  TSH was normal as was a cortisol stimulation test. Incidental finding was a nodule 5 mm in LLL. Outpatient follow-up was recommended. He received nebulized treatments for COPD as needed. He has a chronic right elbow wound with crystal/calcium deposits visible. Wound care recommended cleansing with Betadine swabs daily to prevent superimposed infection. Uric acid 10/18/17 was 5.4. Because of his functional decline and suboptimal living situation SNF placement was recommended and patient was in agreement.  Past medical and surgical history: Includes ischemic cardiopathy, HTN, congestive heart failure. He's had surgery for peripheral vascular "intervention" and abdominal aortogram. 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  Social history: Former heavy binge drinker until 2014. Patient has over a 35-pack-year history of smoking.Marland Kitchen He states that he has smoked > 3/4 pack a day from age 5 to the present. Retired from [employer] which he began @ age 69. 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  Family history: Reviewed  Review of systems:  Includes chronic shortness of breath with intermittent wheezing. He has chronic paresthesias of the feet.  He is agitated and upset He believes someone stole  his credit card and emptied his bank account. He states he is fearful as "I am old and can't even get to the bathroom". He describes chronic pain in his feet as pins and needles preventing him from standing for any significant period. He states that he clears his throat but has no other significant cough.   Constitutional: No fever,significant weight change, fatigue  Eyes: No redness, discharge, pain, vision change ENT/mouth: No nasal congestion,  purulent discharge, earache,change in hearing ,sore throat  Cardiovascular: No active chest pain, palpitations,paroxysmal nocturnal dyspnea, claudication, edema  Respiratory: No hemoptysis, significant snoring,apnea  Gastrointestinal: No heartburn,dysphagia,abdominal pain, nausea / vomiting,rectal bleeding, melena,change in bowels Genitourinary: No dysuria,hematuria, pyuria,  incontinence, nocturia Dermatologic: No rash, pruritus, change in appearance of skin Neurologic: No dizziness,headache,syncope, seizures Endocrine: No change in hair/skin/ nails, excessive thirst, excessive hunger, excessive urination  Hematologic/lymphatic: No significant bruising, lymphadenopathy,abnormal bleeding Allergy/immunology: No itchy/ watery eyes, significant sneezing, urticaria, angioedema  Physical exam:  Pertinent or positive findings: Facies are weathered. His hair and beard are unkempt and disheveled. He has marked decreased hearing. He is not wearing his upper plate. He has severe hygiene problems of the lower teeth with staining and plaque. Heart sounds are distant. He has diffuse low-grade rhonchi homogenously. Pedal pulses are decreased. He is weak in all extremities. He has interosseous wasting. He has small tattoos over the forearms. He has severe arthritic changes greatest in the right hand compared to the left. He has severe arthritic changes at both the PIP and MCP joints in an irregular distribution. There is open wound with extrusion of bland irregular  tissue   at the right elbow. 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 He has marked nicotine staining of the right index fingernail.  General appearance: no acute distress , increased work of breathing is present.   Lymphatic: No lymphadenopathy about the head, neck, axilla . Eyes: No conjunctival inflammation or lid edema is present. There is no scleral icterus. Ears:  External ear exam shows no significant lesions or deformities.   Nose:  External nasal examination shows no deformity or inflammation. Nasal mucosa are pink and moist without lesions ,exudates Oral exam: lips and gums are healthy appearing.There is no oropharyngeal erythema or exudate . Neck:  No thyromegaly, masses, tenderness noted.    Heart:  No gallop, murmur, click, rub .  Lungs: without wheezes,rales , rubs. Abdomen:Bowel sounds are normal. Abdomen is soft and nontender with no organomegaly, hernias,masses. GU: deferred  Extremities:  No cyanosis, edema  Neurologic exam : Balance,Rhomberg,finger to nose testing could not be completed due to clinical state Deep tendon reflexes are equal Skin: Warm & dry w/o tenting. No significant rash.  See clinical summary under each active problem in the Problem List with associated updated therapeutic plan

## 2017-10-25 NOTE — Assessment & Plan Note (Signed)
Patient is receiving parenteral and oral B12 supplement as well as iron orally Monitor for any evidence of GI bleeding

## 2017-10-25 NOTE — Assessment & Plan Note (Addendum)
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
## 2017-10-25 NOTE — Assessment & Plan Note (Signed)
12/7-12/13/18 no rise in troponin or change in EKG

## 2017-10-25 NOTE — Assessment & Plan Note (Signed)
SNF for PT/OT

## 2017-10-25 NOTE — Assessment & Plan Note (Signed)
Patient is not surgical candidate due to his advanced coronary disease which is being treated medically as well as other advanced comorbidities and adult failure to thrive

## 2017-10-25 NOTE — Assessment & Plan Note (Addendum)
Correct B12 dose to 1000 mcg qd from 100 mcg Gabapentin 100 mg 3 times a day

## 2017-10-25 NOTE — Patient Instructions (Addendum)
See assessment and plan under each diagnosis in the problem list and acutely for this visit Total time 48  minutes; greater than 50% of the visit spent counseling patient and coordinating care for problems addressed at this encounter

## 2017-10-31 ENCOUNTER — Other Ambulatory Visit: Payer: Self-pay | Admitting: Licensed Clinical Social Worker

## 2017-10-31 DIAGNOSIS — L988 Other specified disorders of the skin and subcutaneous tissue: Secondary | ICD-10-CM | POA: Diagnosis not present

## 2017-10-31 NOTE — Patient Outreach (Signed)
[facility]) Care Management  10/31/2017  Vincent Dunlap [birth year] [id number]   Assessment- CSW completed call to [facility] social worker on 10/31/17. CSW was unable to reach SW successfully but left a HIPPA compliant voice message encouraging a return call once available in order to gain updates on patient.  Plan-CSW will await for return call or complete additional outreach attempt within one week.  [name], BSW, MSW, [email or web address] Phone: [phone] Fax: [phone]

## 2017-11-02 LAB — CBC AND DIFFERENTIAL
HCT: 35 — AB (ref 41–53)
HEMOGLOBIN: 10.9 — AB (ref 13.5–17.5)
NEUTROS ABS: 9
PLATELETS: 94 — AB (ref 150–399)
WBC: 13

## 2017-11-07 ENCOUNTER — Other Ambulatory Visit: Payer: Self-pay | Admitting: Licensed Clinical Social Worker

## 2017-11-07 NOTE — Patient Outreach (Signed)
[place] [facility]) Care Management  11/07/2017  Vincent Dunlap April 10, 1941 [id number]  Assessment- CSW sent secure email to SNF social worker Vincent Dunlap to follow up on patient's needs and discharge plans.   Plan-CSW will await for return response from SNF social worker and will monitor closely and await for discharge back home.  Vincent Dunlap, BSW, MSW, [email or web address] Phone: [phone] Fax: [phone]

## 2017-11-09 ENCOUNTER — Other Ambulatory Visit: Payer: Self-pay | Admitting: Licensed Clinical Social Worker

## 2017-11-09 NOTE — Patient Outreach (Signed)
[place] [facility]) Care Management  [facility] Social Work  11/09/2017  Vincent Dunlap October 26, 1941 [id number]  Encounter Medications:  Outpatient Encounter Medications as of 11/09/2017  Medication Sig  . acetaminophen (TYLENOL) 325 MG tablet Take 2 tablets (650 mg total) by mouth every 4 (four) hours as needed for headache or mild pain.  Marland Kitchen aspirin 81 MG chewable tablet Chew 1 tablet (81 mg total) by mouth daily.  Marland Kitchen atorvastatin (LIPITOR) 80 MG tablet Take 1 tablet (80 mg total) by mouth daily at 6 PM.  . bisacodyl (DULCOLAX) 10 MG suppository Place 1 suppository (10 mg total) rectally daily as needed for severe constipation.  . carvedilol (COREG) 6.25 MG tablet Take 1 tablet (6.25 mg total) by mouth 2 (two) times daily with a meal.  . clopidogrel (PLAVIX) 75 MG tablet Take 1 tablet (75 mg total) by mouth daily with breakfast.  . esomeprazole (NEXIUM) 40 MG capsule Take 40 mg by mouth daily at 12 noon.   . feeding supplement, ENSURE ENLIVE, (ENSURE ENLIVE) LIQD Take 237 mLs by mouth 2 (two) times daily between meals.  . ferrous sulfate 325 (65 FE) MG tablet Take 1 tablet (325 mg total) by mouth 2 (two) times daily with a meal.  . folic acid (FOLVITE) 1 MG tablet Take 1 tablet (1 mg total) by mouth daily.  . furosemide (LASIX) 20 MG tablet Take 1 tablet (20 mg total) by mouth daily.  . isosorbide mononitrate (IMDUR) 30 MG 24 hr tablet Take 1 tablet (30 mg total) by mouth daily.  Marland Kitchen lisinopril (PRINIVIL,ZESTRIL) 2.5 MG tablet Take 1 tablet (2.5 mg total) by mouth daily.  . nicotine (NICODERM CQ - DOSED IN MG/24 HOURS) 14 mg/24hr patch Place 1 patch (14 mg total) onto the skin daily.  . nitroGLYCERIN (NITROSTAT) 0.4 MG SL tablet Place 1 tablet (0.4 mg total) under the tongue every 5 (five) minutes as needed for chest pain.  Marland Kitchen NUTRITIONAL SUPPLEMENT LIQD Take 120 mLs by mouth 2 (two) times daily. MedPass  . oxycodone (OXY-IR) 5 MG capsule Take 5 mg by mouth every 6 (six) hours as needed for  pain.  . polyethylene glycol (MIRALAX / GLYCOLAX) packet Take 17 g by mouth 2 (two) times daily.  Marland Kitchen senna-docusate (SENOKOT-S) 8.6-50 MG tablet Take 2 tablets by mouth at bedtime.  Marland Kitchen spironolactone (ALDACTONE) 25 MG tablet Take 25 mg by mouth daily.   Marland Kitchen thiamine 100 MG tablet Take 1 tablet (100 mg total) by mouth daily.  . vitamin B-12 100 MCG tablet Take 1 tablet (100 mcg total) by mouth daily.   No facility-administered encounter medications on file as of 11/09/2017.     Functional Status:  In your present state of health, do you have any difficulty performing the following activities: 10/25/2017 10/15/2017  Hearing? Y Y  Comment - -  Vision? N N  Difficulty concentrating or making decisions? N N  Walking or climbing stairs? Y Y  Dressing or bathing? Y Y  Doing errands, shopping? Y Y  Preparing Food and eating ? - -  Using the Toilet? - -  Comment - -  In the past six months, have you accidently leaked urine? - -  Do you have problems with loss of bowel control? - -  Managing your Medications? - -  Managing your Finances? - -  Housekeeping or managing your Housekeeping? - -  Some recent data might be hidden    Fall/Depression Screening:  PHQ 2/9 Scores 10/25/2017 08/09/2017  PHQ -  2 Score 0 0    Assessment: CSW completed SNF visit today with patient on 11/09/17 at [facility]. Patient was in his room watching television when CSW arrived. Patient reports that he is doing well. He shares that he continues to struggle with ongoing foot pain. He reports having "a little" SOB and chest pain at times but not currently. He does admit to be experiencing some dizziness at this time. He just finished lunch and denies having any issues with his appetite. Patient continues to drink ensure. Patient reports enjoying going to PT daily. He shares that he has made some great strides with his walking during his PT sessions. Patient reports that he is able to walk back to his room from PT room using  his walker. He states that he can only do 1 trip of this and usually does so after the end of his PT session if he is not too tired. Patient reports that SNF social worker assisted him with getting a new bank card and they were able to set up for his SS income to be directly deposited into that account. Patient reports that his SS income is deposited on the 4th day of each month. Patient is hopeful that this transition worked successfully. CSW provided education on LTC placement and patient reports that he is still agreeable to this. CSW met with SNF social worker Cameron Ali and she reports that she will be applying for LTC Medicaid for patient. Tillie Rung shares that she is currently looking into  ALF's for patient and will be meeting with patient this week to discuss options.   THN CM Care Plan Problem One     Most Recent Value  Care Plan Problem One  SNF admission after hospitalization  Role Documenting the Problem One  Clinical Social Worker  Care Plan for Problem One  Active  THN CM Short Term Goal #1   Patient will have a safe and stable discharge back home from SNF within 30 days as evidenced by patient report  THN CM Short Term Goal #1 Start Date  10/25/17  Interventions for Short Term Goal #1  CSW met with SNF social worker again today and discussed case and concerns. Patient has agreed to start the LTC placement process at an ALF.   THN CM Short Term Goal #2   Patient will figure out which bank he banks over the next 30 days in order to gain a new bank card   [facility] CM Short Term Goal #2 Start Date  10/25/17  Interventions for Short Term Goal #2  New bank card issued. SS check should deposit into this account by 11/11/16. Goal ongoing.     Plan: CSW will follow up with SNF within one-two weeks. CSW will continue to monitor patient's case closely while at SNF.  [name], BSW, MSW, [email or web address] Phone: [phone] Fax:  [phone]

## 2017-11-11 ENCOUNTER — Other Ambulatory Visit: Payer: Self-pay | Admitting: Licensed Clinical Social Worker

## 2017-11-11 DIAGNOSIS — Z4781 Encounter for orthopedic aftercare following surgical amputation: Secondary | ICD-10-CM | POA: Diagnosis not present

## 2017-11-11 DIAGNOSIS — A419 Sepsis, unspecified organism: Secondary | ICD-10-CM | POA: Diagnosis not present

## 2017-11-11 DIAGNOSIS — N390 Urinary tract infection, site not specified: Secondary | ICD-10-CM | POA: Diagnosis not present

## 2017-11-11 DIAGNOSIS — M86671 Other chronic osteomyelitis, right ankle and foot: Secondary | ICD-10-CM | POA: Diagnosis not present

## 2017-11-11 NOTE — Patient Outreach (Signed)
[place] [facility]) Care Management  11/11/2017  Vincent Dunlap 1941/11/03 [id number]  Assessment- CSW received incoming call and voice message from [place]. She is wondering if [facility] CSW has ALF Medicaid application and if so can this be emailed to her so that patient can apply. CSW unable to obtain special assistance Medicaid application but emailed Tillie Rung and suggested that she contact DSS in order to obtain copy of application. CSW received return email from SNF social worker stating that patient would have to come in to [place] and apply for ALF Medicaid and SNF social worker is currently trying to figure out how patient can apply for services. CSW sent email back with suggestions.  Plan-CSW will continue to collaborate with SNF social worker and follow patient closely while at SNF.  [name], BSW, MSW, [email or web address] Phone: [phone] Fax: [phone]

## 2017-11-14 DIAGNOSIS — L988 Other specified disorders of the skin and subcutaneous tissue: Secondary | ICD-10-CM | POA: Diagnosis not present

## 2017-11-17 DIAGNOSIS — R2681 Unsteadiness on feet: Secondary | ICD-10-CM | POA: Diagnosis not present

## 2017-11-17 DIAGNOSIS — M6281 Muscle weakness (generalized): Secondary | ICD-10-CM | POA: Diagnosis not present

## 2017-11-17 DIAGNOSIS — I2510 Atherosclerotic heart disease of native coronary artery without angina pectoris: Secondary | ICD-10-CM | POA: Diagnosis not present

## 2017-11-17 LAB — CBC AND DIFFERENTIAL
HCT: 34 — AB (ref 41–53)
Hemoglobin: 11.5 — AB (ref 13.5–17.5)
NEUTROS ABS: 5
PLATELETS: 71 — AB (ref 150–399)
WBC: 8.5

## 2017-11-18 ENCOUNTER — Other Ambulatory Visit: Payer: Self-pay | Admitting: Licensed Clinical Social Worker

## 2017-11-18 DIAGNOSIS — I2510 Atherosclerotic heart disease of native coronary artery without angina pectoris: Secondary | ICD-10-CM | POA: Diagnosis not present

## 2017-11-18 DIAGNOSIS — R2681 Unsteadiness on feet: Secondary | ICD-10-CM | POA: Diagnosis not present

## 2017-11-18 DIAGNOSIS — M6281 Muscle weakness (generalized): Secondary | ICD-10-CM | POA: Diagnosis not present

## 2017-11-18 NOTE — Patient Outreach (Signed)
[place] [facility]) Care Management  11/18/2017  DEWITTE VANNICE 06/01/1941 [id number]  Assessment- CSW completed call to Cameron Ali, SNF social worker at [place] but was unable to reach her successfully. CSW left a voice message encouraging a return email or call with updates on patient.  Plan-CSW will continue to follow patient and provide social work assistance.  [name], BSW, MSW, [email or web address] Phone: [phone] Fax: [phone]

## 2017-11-19 DIAGNOSIS — I2510 Atherosclerotic heart disease of native coronary artery without angina pectoris: Secondary | ICD-10-CM | POA: Diagnosis not present

## 2017-11-19 DIAGNOSIS — R2681 Unsteadiness on feet: Secondary | ICD-10-CM | POA: Diagnosis not present

## 2017-11-19 DIAGNOSIS — M6281 Muscle weakness (generalized): Secondary | ICD-10-CM | POA: Diagnosis not present

## 2017-11-21 ENCOUNTER — Non-Acute Institutional Stay (SKILLED_NURSING_FACILITY): Payer: Medicare Other | Admitting: Adult Health

## 2017-11-21 ENCOUNTER — Encounter: Payer: Self-pay | Admitting: Adult Health

## 2017-11-21 DIAGNOSIS — Z720 Tobacco use: Secondary | ICD-10-CM | POA: Diagnosis not present

## 2017-11-21 DIAGNOSIS — M6281 Muscle weakness (generalized): Secondary | ICD-10-CM | POA: Diagnosis not present

## 2017-11-21 DIAGNOSIS — I5022 Chronic systolic (congestive) heart failure: Secondary | ICD-10-CM | POA: Diagnosis not present

## 2017-11-21 DIAGNOSIS — D509 Iron deficiency anemia, unspecified: Secondary | ICD-10-CM | POA: Diagnosis not present

## 2017-11-21 DIAGNOSIS — I2510 Atherosclerotic heart disease of native coronary artery without angina pectoris: Secondary | ICD-10-CM | POA: Diagnosis not present

## 2017-11-21 DIAGNOSIS — D696 Thrombocytopenia, unspecified: Secondary | ICD-10-CM | POA: Diagnosis not present

## 2017-11-21 DIAGNOSIS — L988 Other specified disorders of the skin and subcutaneous tissue: Secondary | ICD-10-CM | POA: Diagnosis not present

## 2017-11-21 DIAGNOSIS — I25118 Atherosclerotic heart disease of native coronary artery with other forms of angina pectoris: Secondary | ICD-10-CM | POA: Diagnosis not present

## 2017-11-21 DIAGNOSIS — G6289 Other specified polyneuropathies: Secondary | ICD-10-CM

## 2017-11-21 DIAGNOSIS — R2681 Unsteadiness on feet: Secondary | ICD-10-CM | POA: Diagnosis not present

## 2017-11-21 DIAGNOSIS — I10 Essential (primary) hypertension: Secondary | ICD-10-CM | POA: Diagnosis not present

## 2017-11-21 DIAGNOSIS — R531 Weakness: Secondary | ICD-10-CM

## 2017-11-21 NOTE — Progress Notes (Signed)
Location:  [facility] Room Number: 120-A Place of Service:  SNF (31) Provider:  Durenda Age, NP  Patient Care Team: Thressa Sheller, MD as PCP - General (Internal Medicine) Thornell Sartorius, MD as Consulting Physician (Otolaryngology) Greg Cutter, LCSW as [place] Management (Licensed Clinical Social Worker)  Extended Emergency Contact Information Primary Emergency Contact: [place] Phone: [phone] Relation: Other Secondary Emergency Contact: Leda [place] Phone: [phone] Mobile Phone: [phone] Relation: Other  Code Status:  Full Code  Goals of care: Advanced Directive information Advanced Directives 10/25/2017  Does Patient Have a Medical Advance Directive? No  Would patient like information on creating a medical advance directive? No - Patient declined     Chief Complaint  Patient presents with  . Medical Management of Chronic Issues    Routine [facility] visit    HPI:  Pt is a 77 y.o. male seen today for medical management of chronic diseases.  He is a short-term rehabilitation resident at [facility]. He was seen in the room today. He did not verbalize any concerns.  He has been admitted to [facility] on 10/20/17 from [facility] 12/7 - 10/20/17 for chest pain. He had taken Nitroglycerin and was taken to the hospital. In transit to ED, he was given ASA 324 mg and 2 tabs sublingual nitroglycerin. Labs showed Na 125 and platelet count 139. He was seen by cardiology. He had a recent cardiac catheterization which showed diffuse coronary artery disease. He was determined to be a candidate for CABG and was recommended medical management only. He has a PMH of ischemic cardiopathy, hypertension, CHF, and hx of alcohol and tobacco abuse.   Past Medical History:  Diagnosis Date  . CAD (coronary  artery disease) 07/2016   3 vessel disease,  evaluated by Dr Servando Snare and felt to be a poor surgical candidate  . CHF (congestive heart failure) ([place])   . Hypertension   . Ischemic cardiomyopathy 2017   EF 30%  . Tobacco abuse    Past Surgical History:  Procedure Laterality Date  . ABDOMINAL AORTOGRAM W/LOWER EXTREMITY Right 07/26/2017   Procedure: ABDOMINAL AORTOGRAM W/LOWER EXTREMITY;  Surgeon: Serafina Mitchell, MD;  Location: [facility];  Service: Cardiovascular;  Laterality: Right;  . AMPUTATION Right 07/28/2017   Procedure: AMPUTATION RIGHT Second TOE;  Surgeon: Waynetta Sandy, MD;  Location: [facility];  Service: Vascular;  Laterality: Right;  . CARDIAC CATHETERIZATION N/A 07/19/2016   Procedure: Right/Left Heart Cath and Coronary Angiography;  Surgeon: Troy Sine, MD;  Location: [facility];  Service: Cardiovascular;  Laterality: N/A;  . INGUINAL HERNIA REPAIR Bilateral   . PERIPHERAL VASCULAR INTERVENTION Right 07/26/2017   Procedure: PERIPHERAL VASCULAR INTERVENTION;  Surgeon: Serafina Mitchell, MD;  Location: [facility];  Service: Cardiovascular;  Laterality: Right;    Allergies  Allergen Reactions  . Chantix [Varenicline] Other (See Comments)    Pt states it made him "crazy"    Outpatient Encounter Medications as of 11/21/2017  Medication Sig  . acetaminophen (TYLENOL) 500 MG tablet Take 500 mg by mouth every 4 (four) hours as needed for mild pain or moderate pain.  Marland Kitchen aspirin 81 MG chewable tablet Chew 1 tablet (81 mg total) by mouth daily.  Marland Kitchen atorvastatin (LIPITOR) 80 MG tablet Take 1 tablet (80 mg total) by mouth daily at 6 PM.  . bisacodyl (DULCOLAX) 10  MG suppository Place 1 suppository (10 mg total) rectally daily as needed for severe constipation.  . carvedilol (COREG) 6.25 MG tablet Take 1 tablet (6.25 mg total) by mouth 2 (two) times daily with a meal.  . clopidogrel (PLAVIX) 75 MG tablet Take 1 tablet (75 mg total) by mouth daily with  breakfast.  . esomeprazole (NEXIUM) 40 MG capsule Take 40 mg by mouth daily at 12 noon.   . feeding supplement, ENSURE ENLIVE, (ENSURE ENLIVE) LIQD Take 237 mLs by mouth 2 (two) times daily between meals.  . ferrous sulfate 325 (65 FE) MG tablet Take 1 tablet (325 mg total) by mouth 2 (two) times daily with a meal.  . folic acid (FOLVITE) 1 MG tablet Take 1 tablet (1 mg total) by mouth daily.  . furosemide (LASIX) 20 MG tablet Take 1 tablet (20 mg total) by mouth daily.  Marland Kitchen gabapentin (NEURONTIN) 100 MG capsule Take 100 mg by mouth 3 (three) times daily.  . isosorbide mononitrate (IMDUR) 30 MG 24 hr tablet Take 1 tablet (30 mg total) by mouth daily.  Marland Kitchen lisinopril (PRINIVIL,ZESTRIL) 2.5 MG tablet Take 1 tablet (2.5 mg total) by mouth daily.  . nicotine (NICODERM CQ - DOSED IN MG/24 HOURS) 14 mg/24hr patch Place 1 patch (14 mg total) onto the skin daily.  . nitroGLYCERIN (NITROSTAT) 0.4 MG SL tablet Place 1 tablet (0.4 mg total) under the tongue every 5 (five) minutes as needed for chest pain.  Marland Kitchen NUTRITIONAL SUPPLEMENT LIQD Take 120 mLs by mouth 2 (two) times daily. MedPass  . oxycodone (OXY-IR) 5 MG capsule Take 5 mg by mouth every 6 (six) hours as needed for pain.  . polyethylene glycol (MIRALAX / GLYCOLAX) packet Take 17 g by mouth 2 (two) times daily.  Marland Kitchen senna-docusate (SENOKOT-S) 8.6-50 MG tablet Take 2 tablets by mouth at bedtime.  Marland Kitchen spironolactone (ALDACTONE) 25 MG tablet Take 25 mg by mouth daily.   Marland Kitchen thiamine 100 MG tablet Take 1 tablet (100 mg total) by mouth daily.  . vitamin B-12 (CYANOCOBALAMIN) 1000 MCG tablet Take 1,000 mcg by mouth daily.  . [DISCONTINUED] acetaminophen (TYLENOL) 325 MG tablet Take 2 tablets (650 mg total) by mouth every 4 (four) hours as needed for headache or mild pain.  . [DISCONTINUED] vitamin B-12 100 MCG tablet Take 1 tablet (100 mcg total) by mouth daily.   No facility-administered encounter medications on file as of 11/21/2017.     Review of  Systems  GENERAL: No change in appetite, no fatigue, no weight changes, no fever, chills or weakness MOUTH and THROAT: Denies oral discomfort, gingival pain or bleeding, pain from teeth or hoarseness   RESPIRATORY: no cough, SOB, DOE, wheezing, hemoptysis CARDIAC: No chest pain, edema or palpitations GI: No abdominal pain, diarrhea, constipation, heart burn, nausea or vomiting GU: Denies dysuria, frequency, hematuria, incontinence, or discharge PSYCHIATRIC: Denies feelings of depression or anxiety. No report of hallucinations, insomnia, paranoia, or agitation   Immunization History  Administered Date(s) Administered  . Influenza, High Dose Seasonal PF 10/16/2017  . Influenza,inj,Quad PF,6+ Mos 07/25/2016  . Pneumococcal Polysaccharide-23 10/16/2017   Pertinent  Health Maintenance Due  Topic Date Due  . PNA vac Low Risk Adult (2 of 2 - PCV13) 10/16/2018  . INFLUENZA VACCINE  Completed   Fall Risk  10/25/2017 08/09/2017  Falls in the past year? Yes Yes  Number falls in past yr: 2 or more 2 or more  Injury with Fall? Yes Yes  Risk Factor Category  High Fall  Risk High Fall Risk  Risk for fall due to : History of fall(s);Impaired balance/gait;Impaired mobility Impaired mobility;Impaired balance/gait  Follow up Education provided Falls prevention discussed;Falls evaluation completed      Vitals:   11/21/17 0909  BP: 124/67  Pulse: 80  Resp: 20  Temp: 98.5 F (36.9 C)  TempSrc: Oral  SpO2: 96%  Weight: 159 lb 3.2 oz (72.2 kg)  Height: 5' 11"  (1.803 m)   Body mass index is 22.2 kg/m.  Physical Exam  GENERAL APPEARANCE: Well nourished. In no acute distress. Normal body habitus SKIN:  Right elbow has chronic open wound with dressing, no drainage noted, hard mass on right forearm near the elbow and 2 small hard mass on right hand MOUTH and THROAT: Lips are without lesions. Oral mucosa is moist and without lesions.  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 Affect and behavior are appropriate   Labs reviewed: 11/17/17  WBC 8.5 hemoglobin 11.5 hematocrit 34.2 MCV 78.4 platelet 71 Recent Labs    10/18/17 0556 10/19/17 0950 10/20/17 0604  NA 126* 128* 129*  K 3.7 3.9 3.8  CL 97* 98* 98*  CO2 22 23 22   GLUCOSE 82 115* 92  BUN 11 9 12   CREATININE 0.54* 0.70 0.52*  CALCIUM 8.4* 8.4* 8.7*  PHOS 3.8 3.8 3.8   Recent Labs    10/18/17 0556 10/19/17 0950 10/20/17 0604  ALBUMIN 2.6* 2.4* 2.6*   Recent Labs    10/14/17 1845  10/16/17 0513 10/18/17 0556 10/19/17 0950 11/02/17  WBC 12.7*   < > 9.6 8.4 8.4 13.0  NEUTROABS 7.9*  --   --   --   --  9  HGB 9.9*   < > 9.3* 8.8* 8.8* 10.9*  HCT 29.6*   < > 27.4* 26.7* 26.9* 35*  MCV 75.3*   < > 73.7* 75.6* 74.9*  --   PLT 139*   < > 201 184 198 94*   < > = values in this interval not displayed.   Lab Results  Component Value Date   TSH 2.644 10/15/2017   Lab Results  Component Value Date   HGBA1C 5.1 07/17/2017   Lab Results  Component Value Date   CHOL 81 07/17/2017   HDL 32 (L) 07/17/2017   LDLCALC 38 07/17/2017   TRIG 57 07/17/2017   CHOLHDL 2.5 07/17/2017    Assessment/Plan  1. Generalized weakness - continue rehabilitation with PT and OT, for therapeutic strengthening exercises, fall precautions   2. Coronary artery disease of native artery of native heart with stable angina pectoris (HCC) - no complaints of chest pains, continue NTG when necessary, aspirin 81 mg 1 tab daily, isosorbide mononitrate 30 mg 1 tab daily, Plavix 75 mg 1 tab daily and atorvastatin 80 mg 1 tab daily   3. Other polyneuropathy - he points to his right thigh and right foot with pain, continue gabapentin 100 mg 1 capsule 3 times a day and oxycodone 5 mg 1 tab every 6 hours when  necessary   4. Chronic systolic CHF (congestive heart failure) (HCC) - no SOB, continue Lasix 20 mg 1 tab daily, Aldactone 25 mg 1 tab daily, lisinopril 2.5 mg 1 tab daily   5. Essential hypertension - well-controlled, continue lisinopril 2.5 mg 1  tab daily and Aldactone 25 mg 1 tab daily   6. Iron deficiency anemia, unspecified iron deficiency anemia type - hgb 11.5, improved, decrease ferrous sulfate 325 mg from twice a day to daily   7. Tobacco use - will decrease nicotine patch from 40 mg/24-hour to 7 mg/24-hour 15 daily 2 weeks then discontinue   8. Thrombocytopenia (HCC) - platelet 71, low, will recheck in 2 weeks, monitor for bruising and bleeding      Family/ staff Communication:  Discussed plan of care with patient and charge nurse.  Labs/tests ordered:  CBC in 2 weeks   Goals of care:   Short-term care    Durenda Age, NP [facility] [phone] (Monday-Friday 8:00 a.m. - 5:00 p.m.) [phone] (after hours)

## 2017-11-22 ENCOUNTER — Other Ambulatory Visit: Payer: Self-pay | Admitting: Licensed Clinical Social Worker

## 2017-11-22 DIAGNOSIS — R2681 Unsteadiness on feet: Secondary | ICD-10-CM | POA: Diagnosis not present

## 2017-11-22 DIAGNOSIS — M6281 Muscle weakness (generalized): Secondary | ICD-10-CM | POA: Diagnosis not present

## 2017-11-22 DIAGNOSIS — I2510 Atherosclerotic heart disease of native coronary artery without angina pectoris: Secondary | ICD-10-CM | POA: Diagnosis not present

## 2017-11-22 NOTE — Patient Outreach (Signed)
[facility]) Care Management  [facility] Social Work  11/22/2017  Vincent Dunlap January 08, 1941 [id number]  Encounter Medications:  Outpatient Encounter Medications as of 11/22/2017  Medication Sig  . acetaminophen (TYLENOL) 500 MG tablet Take 500 mg by mouth every 4 (four) hours as needed for mild pain or moderate pain.  Marland Kitchen aspirin 81 MG chewable tablet Chew 1 tablet (81 mg total) by mouth daily.  Marland Kitchen atorvastatin (LIPITOR) 80 MG tablet Take 1 tablet (80 mg total) by mouth daily at 6 PM.  . bisacodyl (DULCOLAX) 10 MG suppository Place 1 suppository (10 mg total) rectally daily as needed for severe constipation.  . carvedilol (COREG) 6.25 MG tablet Take 1 tablet (6.25 mg total) by mouth 2 (two) times daily with a meal.  . clopidogrel (PLAVIX) 75 MG tablet Take 1 tablet (75 mg total) by mouth daily with breakfast.  . esomeprazole (NEXIUM) 40 MG capsule Take 40 mg by mouth daily at 12 noon.   . feeding supplement, ENSURE ENLIVE, (ENSURE ENLIVE) LIQD Take 237 mLs by mouth 2 (two) times daily between meals.  . ferrous sulfate 325 (65 FE) MG tablet Take 1 tablet (325 mg total) by mouth 2 (two) times daily with a meal.  . folic acid (FOLVITE) 1 MG tablet Take 1 tablet (1 mg total) by mouth daily.  . furosemide (LASIX) 20 MG tablet Take 1 tablet (20 mg total) by mouth daily.  Marland Kitchen gabapentin (NEURONTIN) 100 MG capsule Take 100 mg by mouth 3 (three) times daily.  . isosorbide mononitrate (IMDUR) 30 MG 24 hr tablet Take 1 tablet (30 mg total) by mouth daily.  Marland Kitchen lisinopril (PRINIVIL,ZESTRIL) 2.5 MG tablet Take 1 tablet (2.5 mg total) by mouth daily.  . nicotine (NICODERM CQ - DOSED IN MG/24 HOURS) 14 mg/24hr patch Place 1 patch (14 mg total) onto the skin daily.  . nitroGLYCERIN (NITROSTAT) 0.4 MG SL tablet Place 1 tablet (0.4 mg total) under the tongue every 5 (five) minutes as needed for chest pain.  Marland Kitchen NUTRITIONAL SUPPLEMENT LIQD Take 120 mLs by mouth 2 (two) times daily. MedPass  . oxycodone  (OXY-IR) 5 MG capsule Take 5 mg by mouth every 6 (six) hours as needed for pain.  . polyethylene glycol (MIRALAX / GLYCOLAX) packet Take 17 g by mouth 2 (two) times daily.  Marland Kitchen senna-docusate (SENOKOT-S) 8.6-50 MG tablet Take 2 tablets by mouth at bedtime.  Marland Kitchen spironolactone (ALDACTONE) 25 MG tablet Take 25 mg by mouth daily.   Marland Kitchen thiamine 100 MG tablet Take 1 tablet (100 mg total) by mouth daily.  . vitamin B-12 (CYANOCOBALAMIN) 1000 MCG tablet Take 1,000 mcg by mouth daily.   No facility-administered encounter medications on file as of 11/22/2017.     Functional Status:  In your present state of health, do you have any difficulty performing the following activities: 10/25/2017 10/15/2017  Hearing? Y Y  Comment - -  Vision? N N  Difficulty concentrating or making decisions? N N  Walking or climbing stairs? Y Y  Dressing or bathing? Y Y  Doing errands, shopping? Y Y  Preparing Food and eating ? - -  Using the Toilet? - -  Comment - -  In the past six months, have you accidently leaked urine? - -  Do you have problems with loss of bowel control? - -  Managing your Medications? - -  Managing your Finances? - -  Housekeeping or managing your Housekeeping? - -  Some recent data might be hidden  Fall/Depression Screening:  PHQ 2/9 Scores 10/25/2017 08/09/2017  PHQ - 2 Score 0 0    Assessment: CSW completed SNF visit at [facility] facility. Patient reports to be feeling "different" and states that one of his medications is causing him to experience negative side effects. He reports not knowing what medication is causing this but it is making him uncomfortable. Patient denies knowing whether this is a new medicine or not. Patient reports "Sometimes I can't even talk." Patient was alert, oriented during visit. Patient continues to drink daily ensures. Patient denies having any issues with his appetite. Patient denies experiencing any SOB. Patient confirms that he has had no falls since being at  [facility]. Patient reports that he continues to enjoy PT and OT. However, patient states "It seems like I get to a point and then I can't get no better." Patient continues to have difficulty with his balance. CSW provided positive reinforcements during SNF visit that patient was receptive to. Patient confirms that his SS check was successfully deposited into his new bank account. Goal met. CSW encouraged patient to write his bank information down in several places and he confirms that he did this already. CSW questioned if he had talked to SNF social worker about ALF placement. He reports that he was agreeable to go but now is unsure because he did not know that he would have to had over this full SS check to Medicaid. Patient was educated on the LTC placement process (handouts provided as well) and patient has agreed to re-consider ALF placement and continue to discuss his options with SNF social worker in terms of discharge planning.  CSW unable to meet with SNF social worker as she was not in her office. CSW will continue to collaborate with SNF social worker.   THN CM Care Plan Problem One     Most Recent Value  Care Plan Problem One  SNF admission after hospitalization  Role Documenting the Problem One  Clinical Social Worker  Care Plan for Problem One  Active  THN CM Short Term Goal #1   Patient will have a safe and stable discharge back home from SNF within 30 days as evidenced by patient report  THN CM Short Term Goal #1 Start Date  10/25/17  Interventions for Short Term Goal #1  Goal ongoing  THN CM Short Term Goal #2   Patient will figure out which bank he banks over the next 30 days in order to gain a new bank card   [facility] CM Short Term Goal #2 Start Date  10/25/17  [employer] CM Short Term Goal #2 Met Date  11/22/17  Interventions for Short Term Goal #2  Goal met. Patient successfully gained new card and with a new bank where his SS can be deposited into each month  THN CM Short Term Goal #3  Patient  will consider ALF placement over the next 30 days as evidenced by his discussion with SNF social worker  [facility] CM Short Term Goal #3 Start Date  11/22/17  Interventions for Short Tern Goal #3  CSW has provided education on the LTC placement process. Patient is concerned about giving his entire SS check over to Medicaid. CSW provided handouts on LTC placement. CSW will coordinate with SNF social worker.     Plan: CSW will follow up with SNF social worker within one week and continue to follow patient and provide social work assistance.   [name], BSW, MSW, [email or web address]  Phone: [phone] Fax: [phone]

## 2017-11-23 DIAGNOSIS — M6281 Muscle weakness (generalized): Secondary | ICD-10-CM | POA: Diagnosis not present

## 2017-11-23 DIAGNOSIS — I2510 Atherosclerotic heart disease of native coronary artery without angina pectoris: Secondary | ICD-10-CM | POA: Diagnosis not present

## 2017-11-23 DIAGNOSIS — R2681 Unsteadiness on feet: Secondary | ICD-10-CM | POA: Diagnosis not present

## 2017-11-24 DIAGNOSIS — M6281 Muscle weakness (generalized): Secondary | ICD-10-CM | POA: Diagnosis not present

## 2017-11-24 DIAGNOSIS — I2510 Atherosclerotic heart disease of native coronary artery without angina pectoris: Secondary | ICD-10-CM | POA: Diagnosis not present

## 2017-11-24 DIAGNOSIS — R2681 Unsteadiness on feet: Secondary | ICD-10-CM | POA: Diagnosis not present

## 2017-11-25 ENCOUNTER — Other Ambulatory Visit: Payer: Self-pay | Admitting: Licensed Clinical Social Worker

## 2017-11-25 DIAGNOSIS — I2510 Atherosclerotic heart disease of native coronary artery without angina pectoris: Secondary | ICD-10-CM | POA: Diagnosis not present

## 2017-11-25 DIAGNOSIS — M6281 Muscle weakness (generalized): Secondary | ICD-10-CM | POA: Diagnosis not present

## 2017-11-25 DIAGNOSIS — R2681 Unsteadiness on feet: Secondary | ICD-10-CM | POA: Diagnosis not present

## 2017-11-25 NOTE — Patient Outreach (Signed)
[facility]) Care Management  11/25/2017  Vincent Dunlap January 18, 1941 [id number]  Assessment-CSW sent secure email to [facility] social worker on 11/25/17 to follow up on discharge plans. CSW will continue to coordinate with SNF social worker and await for return email with updates.  Plan-CSW will await for return email or complete call to facility within one week. CSW will continue to provide social work assistance.   [name], BSW, MSW, [email or web address] Phone: [phone] Fax: [phone]

## 2017-11-25 NOTE — Patient Outreach (Signed)
[facility]) Care Management  11/25/2017  Vincent Dunlap 28-Oct-1941 [id number]  Assessment- CSW received return email from SNF social worker [facility]. She reports that patient will remain at facility long term until he is able to transition to ALF. CSW will continue to follow patient and provide social work assistance and support.  Plan-CSW will continue to follow patient closely and provide social work assistance and support.  [name], BSW, MSW, [email or web address] Phone: [phone] Fax: [phone]

## 2017-11-26 DIAGNOSIS — I2510 Atherosclerotic heart disease of native coronary artery without angina pectoris: Secondary | ICD-10-CM | POA: Diagnosis not present

## 2017-11-26 DIAGNOSIS — R2681 Unsteadiness on feet: Secondary | ICD-10-CM | POA: Diagnosis not present

## 2017-11-26 DIAGNOSIS — M6281 Muscle weakness (generalized): Secondary | ICD-10-CM | POA: Diagnosis not present

## 2017-11-28 DIAGNOSIS — L988 Other specified disorders of the skin and subcutaneous tissue: Secondary | ICD-10-CM | POA: Diagnosis not present

## 2017-11-28 DIAGNOSIS — R2681 Unsteadiness on feet: Secondary | ICD-10-CM | POA: Diagnosis not present

## 2017-11-28 DIAGNOSIS — M6281 Muscle weakness (generalized): Secondary | ICD-10-CM | POA: Diagnosis not present

## 2017-11-28 DIAGNOSIS — I2510 Atherosclerotic heart disease of native coronary artery without angina pectoris: Secondary | ICD-10-CM | POA: Diagnosis not present

## 2017-11-29 DIAGNOSIS — M6281 Muscle weakness (generalized): Secondary | ICD-10-CM | POA: Diagnosis not present

## 2017-11-29 DIAGNOSIS — R2681 Unsteadiness on feet: Secondary | ICD-10-CM | POA: Diagnosis not present

## 2017-11-29 DIAGNOSIS — I2510 Atherosclerotic heart disease of native coronary artery without angina pectoris: Secondary | ICD-10-CM | POA: Diagnosis not present

## 2017-11-30 DIAGNOSIS — R2681 Unsteadiness on feet: Secondary | ICD-10-CM | POA: Diagnosis not present

## 2017-11-30 DIAGNOSIS — M6281 Muscle weakness (generalized): Secondary | ICD-10-CM | POA: Diagnosis not present

## 2017-11-30 DIAGNOSIS — I2510 Atherosclerotic heart disease of native coronary artery without angina pectoris: Secondary | ICD-10-CM | POA: Diagnosis not present

## 2017-12-01 DIAGNOSIS — M6281 Muscle weakness (generalized): Secondary | ICD-10-CM | POA: Diagnosis not present

## 2017-12-01 DIAGNOSIS — R2681 Unsteadiness on feet: Secondary | ICD-10-CM | POA: Diagnosis not present

## 2017-12-01 DIAGNOSIS — I2510 Atherosclerotic heart disease of native coronary artery without angina pectoris: Secondary | ICD-10-CM | POA: Diagnosis not present

## 2017-12-02 DIAGNOSIS — M6281 Muscle weakness (generalized): Secondary | ICD-10-CM | POA: Diagnosis not present

## 2017-12-02 DIAGNOSIS — I2510 Atherosclerotic heart disease of native coronary artery without angina pectoris: Secondary | ICD-10-CM | POA: Diagnosis not present

## 2017-12-02 DIAGNOSIS — R2681 Unsteadiness on feet: Secondary | ICD-10-CM | POA: Diagnosis not present

## 2017-12-05 DIAGNOSIS — L988 Other specified disorders of the skin and subcutaneous tissue: Secondary | ICD-10-CM | POA: Diagnosis not present

## 2017-12-05 DIAGNOSIS — D693 Immune thrombocytopenic purpura: Secondary | ICD-10-CM | POA: Diagnosis not present

## 2017-12-07 ENCOUNTER — Other Ambulatory Visit: Payer: Self-pay | Admitting: Licensed Clinical Social Worker

## 2017-12-07 NOTE — Patient Outreach (Signed)
[facility]) Care Management  12/07/2017  Vincent Dunlap 06-24-1941 [id number]  Assessment- CSW completed SNF visit on 12/07/17 at [facility]. CSW went to patient's new room but patient was not there. Patient has officially started LTC placement at [facility] with plans to eventually transition to an ALF. CSW was able to find patient in the PT room. Patient's seem to be adjusting physically better since long term placement. However, patient expresses that he has been experiencing increased depressive symptoms because he feels "instutionlized." CSW provided reflective listening during session. Patient and CSW discussed several positives to patient's transition to long term care placement especially since patient was NOT capable of taking care of himself while living at home. Patient reports that he is now in a shared room and he has some complaints about his roommate. CSW provided emotional support and patient was receptive. Patient was encouraged to discuss this with the attending physician during his next visit. Patient denies wanting mental health resources at this time but is agreeable to discuss symptoms with physician. Patient's physical therapist reports that patient has made great progress this past week in therapy. Patient was able to walk 350 ft with his walker today. Positive reinforcement provided. CSW provided patient with a [facility] Calendar and reviewed it with patient. Patient plans to use the new [facility] Calendar to keep up with his appointments, review community resources (in the front of the [place]) and to keep up with important personal and health information. CSW unable to meet with SNF social worker as she was not in her office. CSW will follow up in two weeks.   THN CM Care Plan Problem One     Most Recent Value  Care Plan Problem One  SNF admission after hospitalization  Role Documenting the Problem One  Clinical Social Worker  Care Plan for Problem One  Active  THN  CM Short Term Goal #1   Patient will have a safe and stable discharge back home from SNF within 30 days as evidenced by patient report  THN CM Short Term Goal #1 Start Date  10/25/17  Interventions for Short Term Goal #1  Goal ongoing  THN CM Short Term Goal #2   Patient will figure out which bank he banks over the next 30 days in order to gain a new bank card   [facility] CM Short Term Goal #2 Start Date  10/25/17  [facility] CM Short Term Goal #2 Met Date  11/22/17  Interventions for Short Term Goal #2  Goal met. Patient successfully gained new card and with a new bank where his SS can be deposited into each month  THN CM Short Term Goal #3  Patient will consider ALF placement over the next 30 days as evidenced by his discussion with SNF social worker  [facility] CM Short Term Goal #3 Start Date  11/22/17  Interventions for Short Tern Goal #3  GOAL ONGOING. SNF social worker is in search of ALF placement for patient. LTC Medicaid approved.  THN CM Short Term Goal #4  Patient will document in his [place] over the next 30 days to increase organization as evidenced by review with CSW  [facility] CM Short Term Goal #4 Start Date  12/07/17  Interventions for Short Term Goal #4  Patient recently lost important financial information and has difficulty keeping up with medical appointments. CSW provided new [address] [facility] Calendar and reviewed entire calendar, notes, community resources and health sections with him. Patient desires to increase his  organization over the next 30 days in order to promote better self-care.     Plan-CSW will gain update from SNF within two weeks. CSW will continue to follow patient and provide social work assistance and support.  [name], BSW, MSW, [email or web address] Phone: [phone] Fax: [phone]

## 2017-12-12 ENCOUNTER — Encounter: Payer: Self-pay | Admitting: Adult Health

## 2017-12-12 ENCOUNTER — Non-Acute Institutional Stay (SKILLED_NURSING_FACILITY): Payer: Medicare Other | Admitting: Adult Health

## 2017-12-12 DIAGNOSIS — D693 Immune thrombocytopenic purpura: Secondary | ICD-10-CM | POA: Diagnosis not present

## 2017-12-12 DIAGNOSIS — G6289 Other specified polyneuropathies: Secondary | ICD-10-CM

## 2017-12-12 DIAGNOSIS — R531 Weakness: Secondary | ICD-10-CM

## 2017-12-12 DIAGNOSIS — I5022 Chronic systolic (congestive) heart failure: Secondary | ICD-10-CM | POA: Diagnosis not present

## 2017-12-12 DIAGNOSIS — D509 Iron deficiency anemia, unspecified: Secondary | ICD-10-CM | POA: Diagnosis not present

## 2017-12-12 DIAGNOSIS — I10 Essential (primary) hypertension: Secondary | ICD-10-CM

## 2017-12-12 DIAGNOSIS — I25118 Atherosclerotic heart disease of native coronary artery with other forms of angina pectoris: Secondary | ICD-10-CM | POA: Diagnosis not present

## 2017-12-12 DIAGNOSIS — D649 Anemia, unspecified: Secondary | ICD-10-CM | POA: Diagnosis not present

## 2017-12-12 DIAGNOSIS — D519 Vitamin B12 deficiency anemia, unspecified: Secondary | ICD-10-CM | POA: Diagnosis not present

## 2017-12-12 LAB — CBC AND DIFFERENTIAL
HEMATOCRIT: 35 — AB (ref 41–53)
HEMOGLOBIN: 11.7 — AB (ref 13.5–17.5)
Neutrophils Absolute: 6
PLATELETS: 62 — AB (ref 150–399)
WBC: 10.2

## 2017-12-12 NOTE — Progress Notes (Signed)
Location:  [place] Room Number: 229-A Place of Service:  SNF (31) Provider:  Durenda Age, NP  Patient Care Team: Thressa Sheller, MD as PCP - General (Internal Medicine) Thornell Sartorius, MD as Consulting Physician (Otolaryngology) Greg Cutter, LCSW as [place] [employer] (Licensed Clinical Social Worker)  Extended Emergency Contact Information Primary Emergency Contact: [place] Phone: [phone] Relation: Other Secondary Emergency Contact: Leda [place] of [place] Phone: [phone] Mobile Phone: [phone] Relation: Other  Code Status:  Full Code  Goals of care: Advanced Directive information Advanced Directives 10/25/2017  Does Patient Have a Medical Advance Directive? No  Would patient like information on creating a medical advance directive? No - Patient declined     Chief Complaint  Patient presents with  . Medical Management of Chronic Issues    Routine [facility] visit    HPI:  Pt is a 77 y.o. male seen today for medical management of chronic diseases.  He is a short-term rehabilitation resident of [facility]. He was noted to have platelet count of 62. No bruising nor bleeding noted. He has history of chronic idiopathic thrombocytopenia. He has a non-healing wound on his right elbow and will be going to the dermatologist today.  He was admitted to [facility] on 10/20/18 from [facility] admission dates 10/14/17 to 10/20/17 for chest pain, hyponatremia and platelet count 139. EKG did not reveal any ST segment elevations. Chest x-ray was negative for any active disease. Cardiology consult was done. He had a recent cardiac catheterization which showed diffuse coronary artery disease. He was thought to be not a candidate for CABG and was recommended medical management only. He has a PMH of ischemic  cardiomyopathy, hypertension, CHF, and a history of tobacco and alcohol abuse. It was noted.    Past Medical History:  Diagnosis Date  . CAD (coronary artery disease) 07/2016   3 vessel disease,  evaluated by Dr Servando Snare and felt to be a poor surgical candidate  . CHF (congestive heart failure) ([place])   . Hypertension   . Ischemic cardiomyopathy 2017   EF 30%  . Tobacco abuse    Past Surgical History:  Procedure Laterality Date  . ABDOMINAL AORTOGRAM W/LOWER EXTREMITY Right 07/26/2017   Procedure: ABDOMINAL AORTOGRAM W/LOWER EXTREMITY;  Surgeon: Serafina Mitchell, MD;  Location: [place] CV LAB;  Service: Cardiovascular;  Laterality: Right;  . AMPUTATION Right 07/28/2017   Procedure: AMPUTATION RIGHT Second TOE;  Surgeon: Waynetta Sandy, MD;  Location: [place];  Service: Vascular;  Laterality: Right;  . CARDIAC CATHETERIZATION N/A 07/19/2016   Procedure: Right/Left Heart Cath and Coronary Angiography;  Surgeon: Troy Sine, MD;  Location: [place] CV LAB;  Service: Cardiovascular;  Laterality: N/A;  . INGUINAL HERNIA REPAIR Bilateral   . PERIPHERAL VASCULAR INTERVENTION Right 07/26/2017   Procedure: PERIPHERAL VASCULAR INTERVENTION;  Surgeon: Serafina Mitchell, MD;  Location: [place] CV LAB;  Service: Cardiovascular;  Laterality: Right;    Allergies  Allergen Reactions  . Chantix [Varenicline] Other (See Comments)    Pt states it made him "crazy"    Outpatient Encounter Medications as of 12/12/2017  Medication Sig  . acetaminophen (TYLENOL) 500 MG tablet Take 500 mg by mouth every 4 (four) hours as needed for mild pain or moderate pain.  Marland Kitchen aspirin 81 MG chewable tablet Chew 1 tablet (81 mg total) by mouth daily.  Marland Kitchen atorvastatin (LIPITOR) 80  MG tablet Take 1 tablet (80 mg total) by mouth daily at 6 PM.  . bisacodyl (DULCOLAX) 10 MG suppository Place 1 suppository (10 mg total) rectally daily as needed for severe constipation.  . carvedilol (COREG) 6.25 MG tablet Take  1 tablet (6.25 mg total) by mouth 2 (two) times daily with a meal.  . clopidogrel (PLAVIX) 75 MG tablet Take 1 tablet (75 mg total) by mouth daily with breakfast.  . esomeprazole (NEXIUM) 40 MG capsule Take 40 mg by mouth daily at 12 noon.   . ferrous sulfate 325 (65 FE) MG tablet Take 1 tablet (325 mg total) by mouth 2 (two) times daily with a meal.  . folic acid (FOLVITE) 1 MG tablet Take 1 tablet (1 mg total) by mouth daily.  . furosemide (LASIX) 20 MG tablet Take 1 tablet (20 mg total) by mouth daily.  Marland Kitchen gabapentin (NEURONTIN) 100 MG capsule Take 100 mg by mouth 3 (three) times daily.  . isosorbide mononitrate (IMDUR) 30 MG 24 hr tablet Take 1 tablet (30 mg total) by mouth daily.  Marland Kitchen lisinopril (PRINIVIL,ZESTRIL) 2.5 MG tablet Take 1 tablet (2.5 mg total) by mouth daily.  . nitroGLYCERIN (NITROSTAT) 0.4 MG SL tablet Place 1 tablet (0.4 mg total) under the tongue every 5 (five) minutes as needed for chest pain.  Marland Kitchen NUTRITIONAL SUPPLEMENT LIQD Take 120 mLs by mouth 2 (two) times daily. MedPass  . Nutritional Supplements (ENSURE ACTIVE) LIQD Take 237 mLs by mouth 2 (two) times daily between meals.  Marland Kitchen oxycodone (OXY-IR) 5 MG capsule Take 5 mg by mouth every 6 (six) hours as needed for pain.  . polyethylene glycol (MIRALAX / GLYCOLAX) packet Take 17 g by mouth 2 (two) times daily.  Marland Kitchen senna-docusate (SENOKOT-S) 8.6-50 MG tablet Take 2 tablets by mouth at bedtime.  Marland Kitchen spironolactone (ALDACTONE) 25 MG tablet Take 25 mg by mouth daily.   Marland Kitchen thiamine 100 MG tablet Take 1 tablet (100 mg total) by mouth daily.  . vitamin B-12 (CYANOCOBALAMIN) 1000 MCG tablet Take 1,000 mcg by mouth daily.  . [DISCONTINUED] feeding supplement, ENSURE ENLIVE, (ENSURE ENLIVE) LIQD Take 237 mLs by mouth 2 (two) times daily between meals.  . [DISCONTINUED] nicotine (NICODERM CQ - DOSED IN MG/24 HOURS) 14 mg/24hr patch Place 1 patch (14 mg total) onto the skin daily.   No facility-administered encounter medications on file as of  12/12/2017.     Review of Systems  GENERAL: No change in appetite, no fatigue, no weight changes, no fever, chills or weakness MOUTH and THROAT: Denies oral discomfort, gingival pain or bleeding RESPIRATORY: no cough, SOB, DOE, wheezing, hemoptysis CARDIAC: No chest pain, edema or palpitations GI: No abdominal pain, diarrhea, constipation, heart burn, nausea or vomiting GU: Denies dysuria, frequency, hematuria,  or discharge PSYCHIATRIC: Denies feelings of depression or anxiety. No report of hallucinations, insomnia, paranoia, or agitation   Immunization History  Administered Date(s) Administered  . Influenza, High Dose Seasonal PF 10/16/2017  . Influenza,inj,Quad PF,6+ Mos 07/25/2016  . Pneumococcal Polysaccharide-23 10/16/2017   Pertinent  Health Maintenance Due  Topic Date Due  . PNA vac Low Risk Adult (2 of 2 - PCV13) 10/16/2018  . INFLUENZA VACCINE  Completed   Fall Risk  10/25/2017 08/09/2017  Falls in the past year? Yes Yes  Number falls in past yr: 2 or more 2 or more  Injury with Fall? Yes Yes  Risk Factor Category  High Fall Risk High Fall Risk  Risk for fall due to : History  of fall(s);Impaired balance/gait;Impaired mobility Impaired mobility;Impaired balance/gait  Follow up Education provided Falls prevention discussed;Falls evaluation completed   Functional Status Survey:    Vitals:   12/12/17 1239  BP: 118/72  Pulse: 69  Resp: 18  Temp: 97.6 F (36.4 C)  TempSrc: Oral  SpO2: 96%  Weight: 164 lb (74.4 kg)  Height: 5\' 11"  (1.803 m)   Body mass index is 22.87 kg/m.  Physical Exam  GENERAL APPEARANCE: Well nourished. In no acute distress. Normal body habitus SKIN:  Right elbow with grainy tan wound MOUTH and THROAT: Lips are without lesions. Oral mucosa is moist and without lesions.  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 Affect and behavior are appropriate   Labs reviewed: 12/12/17  WBC 10.2 hemoglobin 11.7 hematocrit 35.4 MCV 79.8 platelets 62 Recent Labs    10/18/17 0556 10/19/17 0950 10/20/17 0604  NA 126* 128* 129*  K 3.7 3.9 3.8  CL 97* 98* 98*  CO2 22 23 22   GLUCOSE 82 115* 92  BUN 11 9 12   CREATININE 0.54* 0.70 0.52*  CALCIUM 8.4* 8.4* 8.7*  PHOS 3.8 3.8 3.8   Recent Labs    10/18/17 0556 10/19/17 0950 10/20/17 0604  ALBUMIN 2.6* 2.4* 2.6*   Recent Labs    10/14/17 1845  10/16/17 0513 10/18/17 0556 10/19/17 0950 11/02/17 11/17/17  WBC 12.7*   < > 9.6 8.4 8.4 13.0 8.5  NEUTROABS 7.9*  --   --   --   --  9 5  HGB 9.9*   < > 9.3* 8.8* 8.8* 10.9* 11.5*  HCT 29.6*   < > 27.4* 26.7* 26.9* 35* 34*  MCV 75.3*   < > 73.7* 75.6* 74.9*  --   --   PLT 139*   < > 201 184 198 94* 71*   < > = values in this interval not displayed.   Lab Results  Component Value Date   TSH 2.644 10/15/2017   Lab Results  Component Value Date   HGBA1C 5.1 07/17/2017   Lab Results  Component Value Date   CHOL 81 07/17/2017   HDL 32 (L) 07/17/2017   LDLCALC 38 07/17/2017   TRIG 57 07/17/2017   CHOLHDL 2.5 07/17/2017    Assessment/Plan  1. Generalized weakness - continue rehabilitation with PT and OT, for therapeutic strengthening exercises, fall precautions   2. Chronic idiopathic thrombocytopenia (HCC) - platelet  62, will refer to hematology, platelet count in 1 week   3. Chronic systolic CHF (congestive heart failure) (HCC) - no SOB, continue carvedilol 6.25 mg 1 tab twice a day, Lasix 20 mg 1 tab daily, Spironolactone 25 mg 1 tab daily and lisinopril 2.5 mg 1 tab daily   4. Iron deficiency anemia, unspecified iron deficiency anemia type - hemoglobin 11.7, continue ferrous sulfate 325 mg 1 tab daily   5. Coronary artery disease of native artery of native  heart with stable angina pectoris (HCC) - no chest pain, continue aspirin 81 mg 1 tab daily, isosorbide mononitrate ER 30 mg 1 tab daily, Plavix 75 mg 1 tab daily and NTG when necessary   6. Essential hypertension - well-controlled, continue lisinopril 2.5 mg 1 tab daily, carvedilol 6.25 mg 1 tablet twice a day and atorvastatin 80 mg 1  tab daily   7. Other polyneuropathy - continue gabapentin 100 mg 1 capsule 3 times a day and oxycodone 5 mg 1 tab every 6 hours when necessary     Family/ staff Communication:  Discussed plan of care with patient.  Labs/tests ordered:  CBC in 1 week  Goals of care:   Short-term care   Durenda Age, NP [facility] [phone] (Monday-Friday 8:00 a.m. - 5:00 p.m.) [phone] (after hours)

## 2017-12-13 DIAGNOSIS — R202 Paresthesia of skin: Secondary | ICD-10-CM | POA: Diagnosis not present

## 2017-12-13 DIAGNOSIS — D649 Anemia, unspecified: Secondary | ICD-10-CM | POA: Diagnosis not present

## 2017-12-13 DIAGNOSIS — R911 Solitary pulmonary nodule: Secondary | ICD-10-CM | POA: Diagnosis not present

## 2017-12-13 LAB — CBC AND DIFFERENTIAL
HEMATOCRIT: 40 — AB (ref 41–53)
HEMOGLOBIN: 12.8 — AB (ref 13.5–17.5)
Neutrophils Absolute: 7
PLATELETS: 75 — AB (ref 150–399)
WBC: 11.2

## 2017-12-16 ENCOUNTER — Other Ambulatory Visit: Payer: Self-pay | Admitting: Licensed Clinical Social Worker

## 2017-12-16 NOTE — Patient Outreach (Signed)
[facility]) Care Management  12/16/2017  DENNY MCCREE 24-Oct-1941 [id number]  Assessment- CSW sent SECURE email to [facility] social worker on 12/16/17 requesting discharge plans and updates on patient.   Plan-CSW will continue to follow patient while at SNF and will await for response from SNF social worker.   [name], BSW, MSW, [email or web address] Phone: [phone] Fax: [phone]

## 2017-12-19 DIAGNOSIS — L988 Other specified disorders of the skin and subcutaneous tissue: Secondary | ICD-10-CM | POA: Diagnosis not present

## 2017-12-19 DIAGNOSIS — D649 Anemia, unspecified: Secondary | ICD-10-CM | POA: Diagnosis not present

## 2017-12-19 DIAGNOSIS — I2510 Atherosclerotic heart disease of native coronary artery without angina pectoris: Secondary | ICD-10-CM | POA: Diagnosis not present

## 2017-12-21 ENCOUNTER — Other Ambulatory Visit: Payer: Self-pay | Admitting: Licensed Clinical Social Worker

## 2017-12-21 DIAGNOSIS — L821 Other seborrheic keratosis: Secondary | ICD-10-CM | POA: Diagnosis not present

## 2017-12-21 DIAGNOSIS — L905 Scar conditions and fibrosis of skin: Secondary | ICD-10-CM | POA: Diagnosis not present

## 2017-12-21 DIAGNOSIS — D485 Neoplasm of uncertain behavior of skin: Secondary | ICD-10-CM | POA: Diagnosis not present

## 2017-12-21 NOTE — Patient Outreach (Signed)
[facility]) Care Management  12/21/2017  JYREN CERASOLI 07/24/1941 [id number]  Assessment- CSW received return email from SNF social worker. Patient is still at [facility] under [employer]. No further updates at this time.  Plan-CSW will follow up in two weeks before completing discharge.   [name], BSW, MSW, [email or web address] Phone: [phone] Fax: [phone]

## 2017-12-26 DIAGNOSIS — L988 Other specified disorders of the skin and subcutaneous tissue: Secondary | ICD-10-CM | POA: Diagnosis not present

## 2017-12-29 ENCOUNTER — Telehealth: Payer: Self-pay | Admitting: Internal Medicine

## 2017-12-29 NOTE — Telephone Encounter (Signed)
Called and spoke to [facility] with [facility]. Langley Gauss states that Raquel Sarna called regarding records, as pt has consult scheduled on 12/30/17. Records are within epic. Nothing further is needed.

## 2017-12-30 ENCOUNTER — Ambulatory Visit (INDEPENDENT_AMBULATORY_CARE_PROVIDER_SITE_OTHER): Payer: Medicare Other | Admitting: Internal Medicine

## 2017-12-30 ENCOUNTER — Encounter: Payer: Self-pay | Admitting: Internal Medicine

## 2017-12-30 VITALS — BP 118/68 | HR 90 | Ht 71.0 in | Wt 165.6 lb

## 2017-12-30 DIAGNOSIS — R911 Solitary pulmonary nodule: Secondary | ICD-10-CM | POA: Diagnosis not present

## 2017-12-30 DIAGNOSIS — I10 Essential (primary) hypertension: Secondary | ICD-10-CM | POA: Diagnosis not present

## 2017-12-30 DIAGNOSIS — J479 Bronchiectasis, uncomplicated: Secondary | ICD-10-CM

## 2017-12-30 DIAGNOSIS — R05 Cough: Secondary | ICD-10-CM | POA: Diagnosis not present

## 2017-12-30 DIAGNOSIS — R058 Other specified cough: Secondary | ICD-10-CM

## 2017-12-30 DIAGNOSIS — J449 Chronic obstructive pulmonary disease, unspecified: Secondary | ICD-10-CM

## 2017-12-30 NOTE — Patient Instructions (Signed)
Stop lisinopril   Start losartan 50 mg daily   Please schedule a follow up office visit in 6 weeks, call sooner if needed with pfts and cxr on return

## 2017-12-30 NOTE — Progress Notes (Signed)
Subjective:     Patient ID: Vincent Dunlap, male   DOB: [birth year],   MRN: [id number]  HPI  77 yowm quit smoking 09/2017    Admit date: 10/14/2017 Discharge date: 10/20/2017  Admitted From: home Disposition:  SNF  Recommendations for Outpatient Follow-up:  1. Follow up with PCP in 1-2 weeks 2. Please obtain BMP/CBC in one week 3. Continue Lasix   Home Health: none Equipment/Devices: none  Discharge Condition: stable CODE STATUS: Full code Diet recommendation: heart healthy  HPI: Per Dr. Stana Bunting, This 77 year old man with coronary artery diseasewho has triple-vessel disease deemed not a candidate for CABG due to comorbidities and frailty, history of heart failure with most recent echo revealing improvement of ejection fraction to 55%, current every day smoker, COPD, peripheral arterial disease, and poor social living situation presenting to the emergency department with chest pain. Patient reports living [place], he is a retired Building control surveyor, he reports needing help with his ADLs. His roommate moved out 4-5 days prior to admission and he has been a difficult position. He receives one meal a day for Meals on Wheels and otherwise snacks or whatever is available. He reports adequate by mouth intake as far as fluids ago. He reports drinking alcohol at times up to 6-7 beers a day, last consumed that amount 2 weeks ago according to his report. He did having difficulty getting out of his chair, citing that when he does he becomes dizzy and lightheaded. On day of admission he developed central chest pain that lasts for 30 minutes, described as sharp, exacerbated by emotional stress. He took a sublingual nitroglycerin and was taken to the hospital for further evaluation. Reports some wheezing, no fevers, chills, productive cough. He reports other symptoms including chronic paresthesias of the feet. Cannot recall which medications he takes at home. ED Course:In transit via EMS received aspirin 324  mg, as well as 2 sublingual nitroglycerin. Vital signs were relatively unremarkable for normal heart rate normal respiratory rate and systolic blood [id number] mm Hg.lab work remarkable for hyponatremia to 125, BNP of 444, white count 12.7, hemoglobin 9.9, MCV 75, platelet count of 139. Due to his symptomatic improvement in transit he was not given any medications from the emergency department. EKG did not reveal any ST segment elevations. Chest x-ray did not reveal any active pulmonary disease, as well as chronic interstitial lung disease.    Hospital Course: Discharge Diagnoses:  Active Problems:   CAD (coronary artery disease), native coronary artery   Chronic systolic CHF (congestive heart failure) (HCC)   COPD (chronic obstructive pulmonary disease) (HCC)   Chest pain   Hyponatremia   Chest pain in setting of CAD, prior HF (now preserved EF), and peripheral arterial disease -Chest pain free after admission, he was continued on Imdur, aspirin and plavix.Troponin negative. He was evaluated by cardiology recently with a cardiac catheterization which showed diffuse coronary artery disease, however was not deemed a candidate for CABG and was recommended medical management only Hyponatremia-Acute on chronic, probablySIADH, urine sodium more than 40 and urine osmolality more than 300.  He improved in the hospital with water restriction and Lasix, nephrology was consulted and followed patient while hospitalized.  Sodium 1 discharge was 129 which is close to his baseline, continue Lasix on discharge and would recommend fluid restriction as well.  Would recommend repeat the BNP within a week.  He was ruled out for thyroid disease and renal insufficiency, had a normal TSH and a normal cortisol stim test respectively. Lung nodule -  CT chest incidentally discovered a 3.5 mm pulmonary nodule. Need CT follow up as an outpatient. 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  Wound care consulted and recommended "To prevent infection, we will pain the open lesion with a betadine swabstick once daily and allow to air-dry. No dressing needed."    Discharge Instructions      Allergies as of 10/20/2017      Reactions   Chantix [varenicline] Other (See Comments)   Pt states it made him "crazy"              Medication List     STOP taking these medications   amitriptyline 25 MG tablet Commonly known as:  ELAVIL   amLODipine-benazepril 5-20 MG capsule Commonly known as:  LOTREL     TAKE these medications   acetaminophen 325 MG tablet Commonly known as:  TYLENOL Take 2 tablets (650 mg total) by mouth every 4 (four) hours as needed for headache or mild pain.   aspirin 81 MG chewable tablet Chew 1 tablet (81 mg total) by mouth daily.   atorvastatin 80 MG tablet Commonly known as:  LIPITOR Take 1 tablet (80 mg total) by mouth daily at 6 PM.   bisacodyl 10 MG suppository Commonly known as:  DULCOLAX Place 1 suppository (10 mg total) rectally daily as needed for severe constipation.   carvedilol 6.25 MG tablet Commonly known as:  COREG Take 1 tablet (6.25 mg total) by mouth 2 (two) times daily with a meal.   clopidogrel 75 MG tablet Commonly known as:  PLAVIX Take 1 tablet (75 mg total) by mouth daily with breakfast.   cyanocobalamin 100 MCG tablet Take 1 tablet (100 mcg total) by mouth daily. Start taking on:  10/21/2017   esomeprazole 40 MG capsule Commonly known as:  NEXIUM Take 40 mg by mouth daily at 12 noon.   feeding supplement (ENSURE ENLIVE) Liqd Take 237 mLs  by mouth 2 (two) times daily between meals.   ferrous sulfate 325 (65 FE) MG tablet Take 1 tablet (325 mg total) by mouth 2 (two) times daily with a meal.   folic acid 1 MG tablet Commonly known as:  FOLVITE Take 1 tablet (1 mg total) by mouth daily. Start taking on:  10/21/2017   furosemide 20 MG tablet Commonly known as:  LASIX Take 1 tablet (20 mg total) by mouth daily. Start taking on:  10/21/2017   isosorbide mononitrate 30 MG 24 hr tablet Commonly known as:  IMDUR Take 1 tablet (30 mg total) by mouth daily.   lisinopril 2.5 MG tablet Commonly known as:  PRINIVIL,ZESTRIL Take 1 tablet (2.5 mg total) by mouth daily.   nicotine 14 mg/24hr patch Commonly known as:  NICODERM CQ - dosed in mg/24 hours Place 1 patch (14 mg total) onto the skin daily.   nitroGLYCERIN 0.4 MG SL tablet Commonly known as:  NITROSTAT Place 1 tablet (0.4 mg total) under the tongue every 5 (five) minutes as needed for chest pain.   oxyCODONE 5 MG immediate release tablet Commonly known as:  ROXICODONE Take 1 tablet (5 mg total) by mouth every 8 (eight) hours as needed for severe pain.  polyethylene glycol packet Commonly known as:  MIRALAX / GLYCOLAX Take 17 g by mouth 2 (two) times daily.   spironolactone 25 MG tablet Commonly known as:  ALDACTONE Take 25 mg by mouth daily.   thiamine 100 MG tablet Take 1 tablet (100 mg total) by mouth daily. Start taking on:  10/21/2017       12/30/2017  Consultation re cough  Chief Complaint  Patient presents with  . Pulmonary Consult    Referred by Dr. Unice Cobble. He states unsure what the reason for the visit is today. He reports he had been coughing and hearing "bubbling"approx 3 wks ago but "I'ts about gone now".    Dyspnea: really more limited by fatigue/ strength/ foot pain on duoneb per noteds but  not listed in NH MAR or Epic MAR Cough:  "just a tickle, it's my sinuses" Sleep: ok at < 30 degrees hob    No obvious day to  day or daytime variability or assoc excess/ purulent sputum or mucus plugs or hemoptysis or cp or chest tightness, subjective wheeze or overt sinus or hb symptoms. No unusual exposure hx or h/o childhood pna/ asthma or knowledge of premature birth.  Sleeping ok at 30 degrees without nocturnal  or early am exacerbation  of respiratory  c/o's or need for noct saba. Also denies any obvious fluctuation of symptoms with weather or environmental changes or other aggravating or alleviating factors except as outlined above   Current Allergies, Complete Past Medical History, Past Surgical History, Family History, and Social History were reviewed in [employer] [employer] record.  ROS  The following are not active complaints unless bolded Hoarseness, sore throat, dysphagia, dental problems, itching, sneezing,  nasal congestion or discharge of excess mucus or purulent secretions, ear ache,   fever, chills, sweats, unintended wt loss or wt gain, classically pleuritic or exertional cp,  orthopnea pnd or leg swelling, presyncope, palpitations, abdominal pain, anorexia, nausea, vomiting, diarrhea  or change in bowel habits or change in bladder habits, change in stools or change in urine, dysuria, hematuria,  rash, arthralgias, visual complaints, headache, numbness, weakness or ataxia or problems with walking or coordination,  change in mood/affect or memory.        Current Meds  Medication Sig  . acetaminophen (TYLENOL) 500 MG tablet Take 500 mg by mouth every 4 (four) hours as needed for mild pain or moderate pain.  Marland Kitchen aspirin 81 MG chewable tablet Chew 1 tablet (81 mg total) by mouth daily.  Marland Kitchen atorvastatin (LIPITOR) 80 MG tablet Take 1 tablet (80 mg total) by mouth daily at 6 PM.  . bisacodyl (DULCOLAX) 10 MG suppository Place 1 suppository (10 mg total) rectally daily as needed for severe constipation.  . carvedilol (COREG) 6.25 MG tablet Take 1 tablet (6.25 mg total) by mouth 2 (two) times daily with  a meal.  . clopidogrel (PLAVIX) 75 MG tablet Take 1 tablet (75 mg total) by mouth daily with breakfast.  . esomeprazole (NEXIUM) 40 MG capsule Take 40 mg by mouth daily at 12 noon.   . folic acid (FOLVITE) 1 MG tablet Take 1 tablet (1 mg total) by mouth daily.  . furosemide (LASIX) 20 MG tablet Take 1 tablet (20 mg total) by mouth daily.  Marland Kitchen gabapentin (NEURONTIN) 100 MG capsule Take 100 mg by mouth 3 (three) times daily.  . isosorbide mononitrate (IMDUR) 30 MG 24 hr tablet Take 1 tablet (30 mg total) by mouth daily.  Marland Kitchen lisinopril (PRINIVIL,ZESTRIL) 2.5 MG  tablet Take 1 tablet (2.5 mg total) by mouth daily.  . nitroGLYCERIN (NITROSTAT) 0.4 MG SL tablet Place 1 tablet (0.4 mg total) under the tongue every 5 (five) minutes as needed for chest pain.  . Nutritional Supplements (ENSURE ACTIVE) LIQD Take 237 mLs by mouth 2 (two) times daily between meals.  Marland Kitchen oxycodone (OXY-IR) 5 MG capsule Take 5 mg by mouth every 6 (six) hours as needed for pain.  . polyethylene glycol (MIRALAX / GLYCOLAX) packet Take 17 g by mouth 2 (two) times daily.  Marland Kitchen senna-docusate (SENOKOT-S) 8.6-50 MG tablet Take 2 tablets by mouth at bedtime.  Marland Kitchen spironolactone (ALDACTONE) 25 MG tablet Take 25 mg by mouth daily.   Marland Kitchen thiamine 100 MG tablet Take 1 tablet (100 mg total) by mouth daily.  . vitamin B-12 (CYANOCOBALAMIN) 1000 MCG tablet Take 1,000 mcg by mouth daily.  . [DISCONTINUED] NUTRITIONAL SUPPLEMENT LIQD Take 120 mLs by mouth 2 (two) times daily. [employer]            Review of Systems     Objective:   Physical Exam    w/c bound wm nad - very harsh upper airway cough   Wt Readings from Last 3 Encounters:  12/30/17 165 lb 9.6 oz (75.1 kg)  12/12/17 164 lb (74.4 kg)  11/21/17 159 lb 3.2 oz (72.2 kg)     Vital signs reviewed - Note on arrival 02 sats  97% on RA      HEENT: nl   turbinates bilaterally, and oropharynx. Nl external ear canals without cough reflex - no upper teeth, few residual lower incisors very  decayed    NECK :  without JVD/Nodes/TM/ nl carotid upstrokes bilaterally   LUNGS: no acc muscle use,  Minimal insp and exp rhonchi bilaterally with no wheezing or cough on insp/ exp    CV:  RRR  no s3 or murmur or increase in P2, and no edema   ABD:  soft and nontender with nl inspiratory excursion in the supine position. 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      I personally reviewed images and agree with radiology impression as follows:   Chest CT 10/18/17 1. Extensive underlying emphysematous change with bullae in both upper lobes, more on the right than on the left. There is bronchiectatic change bilaterally, more severe on the right than on the left. Areas of fibrosis noted in the posterior segments of both upper lobes, more on the right than on the left as well as in portions of the right lower lobe.  2.  Bibasilar atelectasis.  No edema or consolidation.  3. 5 mm nodular opacity left lower lobe. No follow-up needed if patient is low-risk. Non-contrast chest CT can be considered in 12 months if patient is high-risk.    4. Mildly prominent right hilar and sub- carinal lymph nodes. Suspect reactive etiology given the changes in the lungs.  5. Aortic and major mesenteric/great vessel atherosclerotic calcification. There are foci of coronary artery calcification.  Aortic Atherosclerosis (ICD10-I70.0) and Emphysema (ICD10-J43.9). Assessment:

## 2017-12-31 ENCOUNTER — Encounter: Payer: Self-pay | Admitting: Internal Medicine

## 2017-12-31 DIAGNOSIS — R911 Solitary pulmonary nodule: Secondary | ICD-10-CM | POA: Insufficient documentation

## 2017-12-31 DIAGNOSIS — R058 Other specified cough: Secondary | ICD-10-CM | POA: Insufficient documentation

## 2017-12-31 DIAGNOSIS — R05 Cough: Secondary | ICD-10-CM | POA: Insufficient documentation

## 2017-12-31 NOTE — Assessment & Plan Note (Signed)
5 mm LLL spn 10/18/17 > in computer for reminder 10/18/18   Very strongly doubt he would ever be a candidate for aggressive rx for this nodule but will check his status again in a year and if has improved then repeat ct chest as per fleishner society guidelines   Discussed in detail all the  indications, usual  risks and alternatives  relative to the benefits with patient who agrees to proceed with conservative f/u as outlined

## 2017-12-31 NOTE — Assessment & Plan Note (Addendum)
In the best review of chronic cough to date ( NEJM 2016 375 [id number]) ,  ACEi are now felt to cause cough in up to  20% of pts which is a 4 fold increase from previous reports and does not include the variety of non-specific complaints we see in pulmonary clinic in pts on ACEi but previously attributed to another dx like  Copd/asthma and  include PNDS(which is what he insists makes him cough) , throat  congestion, "bronchitis", unexplained dyspnea and noct "strangling" sensations, and hoarseness, but also  atypical /refractory GERD symptoms like dysphagia and "bad heartburn"   The only way I know  to prove this is not an "ACEi Case" is a trial off ACEi x a minimum of 6 weeks then regroup.   Try losartan 76m daily and f/u in 6 weeks with full pfts

## 2017-12-31 NOTE — Assessment & Plan Note (Addendum)
See CT chest 10/18/17  - Spirometry 12/30/2017  FEV1 2.11 (67%)  Ratio 67  With very little if any curvature to f/v p ? duoneb prior?   His cough is not at all typical of bronchiectasis and his pfts do not show significant copd so these findings are not probably contributing to his present symptoms and do not req specific rx at this point - see uacs (see separate a/p)    Ok to continue duoneb prn

## 2017-12-31 NOTE — Assessment & Plan Note (Addendum)
Upper airway cough syndrome (previously labeled PNDS),  is so named because it's frequently impossible to sort out how much is  CR/sinusitis with freq throat clearing (which can be related to primary GERD)   vs  causing  secondary (" extra esophageal")  GERD from wide swings in gastric pressure that occur with throat clearing, often  promoting self use of mint and menthol lozenges that reduce the lower esophageal sphincter tone and exacerbate the problem further in a cyclical fashion.   These are the same pts (now being labeled as having "irritable larynx syndrome" by some cough centers) who not infrequently have a history of having failed to tolerate ace inhibitors,  dry powder inhalers or biphosphonates or report having atypical/extraesophageal reflux symptoms that don't respond to standard doses of PPI  and are easily confused as having aecopd or asthma flares by even experienced allergists/ pulmonologists (myself included).   rec first try off acei and f/u in 6 weeks   Total time devoted to counseling  > 50 % of initial 60 min office visit:  review case including extensive inpt/ NH notes  with pt/ discussion of options/alternatives/ personally creating written customized instructions  in presence of pt  then going over those specific  Instructions directly with the pt including how to use all of the meds but in particular covering each new medication in detail and the difference between the maintenance= "automatic" meds and the prns using an action plan format for the latter (If this problem/symptom => do that organization reading Left to right).  Please see AVS from this visit for a full list of these instructions which I personally wrote for this pt and  are unique to this visit.

## 2017-12-31 NOTE — Assessment & Plan Note (Signed)
Quit smoking 09/2017 - Spirometry 12/30/2017  FEV1 2.11 (67%)  Ratio 67  With very little if any curvature to f/v p ? duoneb prior?    I reviewed the Fletcher curve with the patient that basically indicates  if you quit smoking when your best day FEV1 is still well preserved (as is clearly  the case here)  it is highly unlikely you will progress to severe disease and informed the patient there was  no medication on the market that has proven to alter the curve/ its downward trajectory  or the likelihood of progression of their disease(unlike other chronic medical conditions such as atheroclerosis where we do think we can change the natural hx with risk reducing meds)    Therefore stopping smoking and maintaining abstinence are  the most important aspects of his care, not choice of inhalers or for that matter, doctors.   Treatment other than smoking cessation  is entirely directed by severity of symptoms and focused also on reducing exacerbations, not attempting to change the natural history of the disease.   For now, duoneb will do

## 2018-01-02 ENCOUNTER — Other Ambulatory Visit: Payer: Self-pay | Admitting: Licensed Clinical Social Worker

## 2018-01-02 NOTE — Patient Outreach (Signed)
[facility]) Care Management  01/02/2018  JERRON NIBLACK February 14, 1941 [id number]  Assessment- CSW sent secure email to [facility] to question if there were any updates on patient.   Plan-CSW will await for return response and continue to follow patient while at SNF.  [name], BSW, MSW, [email or web address] Phone: [phone] Fax: [phone]

## 2018-01-03 ENCOUNTER — Ambulatory Visit: Payer: Self-pay | Admitting: Licensed Clinical Social Worker

## 2018-01-04 ENCOUNTER — Ambulatory Visit: Payer: Self-pay | Admitting: Licensed Clinical Social Worker

## 2018-01-06 ENCOUNTER — Other Ambulatory Visit: Payer: Self-pay | Admitting: Licensed Clinical Social Worker

## 2018-01-06 NOTE — Patient Outreach (Addendum)
[facility]) Care Management  01/06/2018  ALICK LECOMTE 1941-04-04 [id number]  Assessment- CSW completed call to [facility] and confirmed with front desk that patient is still at facility which means patient is still under LTC care there. CSW will complete case closure at this time at patient is at [facility] for LTC placement.   Plan-CSW will notify PCP and [place] Management Assistant of case closure.  [name], BSW, MSW, [email or web address] Phone: [phone] Fax: [phone]

## 2018-01-09 DIAGNOSIS — L988 Other specified disorders of the skin and subcutaneous tissue: Secondary | ICD-10-CM | POA: Diagnosis not present

## 2018-01-11 ENCOUNTER — Non-Acute Institutional Stay (SKILLED_NURSING_FACILITY): Payer: Medicare Other | Admitting: Adult Health

## 2018-01-11 ENCOUNTER — Encounter: Payer: Self-pay | Admitting: Adult Health

## 2018-01-11 DIAGNOSIS — I25118 Atherosclerotic heart disease of native coronary artery with other forms of angina pectoris: Secondary | ICD-10-CM

## 2018-01-11 DIAGNOSIS — G6289 Other specified polyneuropathies: Secondary | ICD-10-CM

## 2018-01-11 DIAGNOSIS — I10 Essential (primary) hypertension: Secondary | ICD-10-CM

## 2018-01-11 DIAGNOSIS — I5022 Chronic systolic (congestive) heart failure: Secondary | ICD-10-CM | POA: Diagnosis not present

## 2018-01-11 DIAGNOSIS — B001 Herpesviral vesicular dermatitis: Secondary | ICD-10-CM

## 2018-01-11 DIAGNOSIS — D693 Immune thrombocytopenic purpura: Secondary | ICD-10-CM

## 2018-01-11 NOTE — Progress Notes (Signed)
Location:  [place] Room Number: 903-E Place of Service:  SNF (31) Provider:  Durenda Age, NP  Patient Care Team: Thressa Sheller, MD as PCP - General (Internal Medicine) Thornell Sartorius, MD as Consulting Physician (Otolaryngology)  Extended Emergency Contact Information Primary Emergency Contact: Glynis Smiles [place] Phone: [phone] Relation: Other Secondary Emergency Contact: Leda Min [place] Phone: [phone] Mobile Phone: [phone] Relation: Other  Code Status:  Full Code  Goals of care: Advanced Directive information Advanced Directives 10/25/2017  Does Patient Have a Medical Advance Directive? No  Would patient like information on creating a medical advance directive? No - Patient declined     Chief Complaint  Patient presents with  . Medical Management of Chronic Issues    Routine [facility] visit    HPI:  Pt is a 77 y.o. male seen today for medical management of chronic diseases. He is a long-term care resident of [facility].  He has a PMH of ischemic cardiomyopathy, HTN, CHF, and a history of tobacco and alcohol abuse. He was seen in his room today. He was noted to have fever blister lesion on his right upper lip.    Past Medical History:  Diagnosis Date  . CAD (coronary artery disease) 07/2016   3 vessel disease,  evaluated by Dr Servando Snare and felt to be a poor surgical candidate  . CHF (congestive heart failure) ([place])   . Hypertension   . Ischemic cardiomyopathy 2017   EF 30%  . Tobacco abuse    Past Surgical History:  Procedure Laterality Date  . ABDOMINAL AORTOGRAM W/LOWER EXTREMITY Right 07/26/2017   Procedure: ABDOMINAL AORTOGRAM W/LOWER EXTREMITY;  Surgeon: Serafina Mitchell, MD;  Location: [facility];  Service: Cardiovascular;  Laterality: Right;  . AMPUTATION Right 07/28/2017   Procedure: AMPUTATION RIGHT Second TOE;   Surgeon: Waynetta Sandy, MD;  Location: [place];  Service: Vascular;  Laterality: Right;  . CARDIAC CATHETERIZATION N/A 07/19/2016   Procedure: Right/Left Heart Cath and Coronary Angiography;  Surgeon: Troy Sine, MD;  Location: [place] [facility];  Service: Cardiovascular;  Laterality: N/A;  . INGUINAL HERNIA REPAIR Bilateral   . PERIPHERAL VASCULAR INTERVENTION Right 07/26/2017   Procedure: PERIPHERAL VASCULAR INTERVENTION;  Surgeon: Serafina Mitchell, MD;  Location: [facility];  Service: Cardiovascular;  Laterality: Right;    Allergies  Allergen Reactions  . Chantix [Varenicline] Other (See Comments)    Pt states it made him "crazy"    Outpatient Encounter Medications as of 01/11/2018  Medication Sig  . acetaminophen (TYLENOL) 500 MG tablet Take 500 mg by mouth every 4 (four) hours as needed for mild pain or moderate pain.  Marland Kitchen aspirin 81 MG chewable tablet Chew 1 tablet (81 mg total) by mouth daily.  Marland Kitchen atorvastatin (LIPITOR) 80 MG tablet Take 1 tablet (80 mg total) by mouth daily at 6 PM.  . bisacodyl (DULCOLAX) 10 MG suppository Place 1 suppository (10 mg total) rectally daily as needed for severe constipation.  . carvedilol (COREG) 6.25 MG tablet Take 1 tablet (6.25 mg total) by mouth 2 (two) times daily with a meal.  . clopidogrel (PLAVIX) 75 MG tablet Take 1 tablet (75 mg total) by mouth daily with breakfast.  . esomeprazole (NEXIUM) 40 MG capsule Take 40 mg by mouth daily at 12 noon.   . folic acid (FOLVITE) 1 MG tablet Take 1 tablet (1 mg total) by mouth daily.  Marland Kitchen  furosemide (LASIX) 20 MG tablet Take 1 tablet (20 mg total) by mouth daily.  Marland Kitchen gabapentin (NEURONTIN) 100 MG capsule Take 100 mg by mouth 3 (three) times daily.  . isosorbide mononitrate (IMDUR) 30 MG 24 hr tablet Take 1 tablet (30 mg total) by mouth daily.  Marland Kitchen losartan (COZAAR) 50 MG tablet Take 50 mg by mouth daily.  . nitroGLYCERIN (NITROSTAT) 0.4 MG SL tablet Place 1 tablet (0.4 mg total) under the  tongue every 5 (five) minutes as needed for chest pain.  Marland Kitchen NUTRITIONAL SUPPLEMENT LIQD Take 120 mLs by mouth 2 (two) times daily.  . Nutritional Supplements (ENSURE ACTIVE) LIQD Take 237 mLs by mouth 2 (two) times daily between meals.  Marland Kitchen oxycodone (OXY-IR) 5 MG capsule Take 5 mg by mouth every 6 (six) hours as needed for pain.  . polyethylene glycol (MIRALAX / GLYCOLAX) packet Take 17 g by mouth 2 (two) times daily.  Marland Kitchen senna-docusate (SENOKOT-S) 8.6-50 MG tablet Take 2 tablets by mouth at bedtime.  Marland Kitchen spironolactone (ALDACTONE) 25 MG tablet Take 25 mg by mouth daily.   Marland Kitchen thiamine 100 MG tablet Take 1 tablet (100 mg total) by mouth daily.  . vitamin B-12 (CYANOCOBALAMIN) 1000 MCG tablet Take 1,000 mcg by mouth daily.   No facility-administered encounter medications on file as of 01/11/2018.     Review of Systems  GENERAL: No fever, chills or weakness MOUTH and THROAT: Denies oral discomfort, gingival pain or bleeding RESPIRATORY: no cough, SOB, DOE, wheezing, hemoptysis CARDIAC: No chest pain, edema or palpitations GI: No abdominal pain, diarrhea, constipation, heart burn, nausea or vomiting PSYCHIATRIC: Denies feelings of depression or anxiety. No report of hallucinations, insomnia, paranoia, or agitation   Immunization History  Administered Date(s) Administered  . Influenza, High Dose Seasonal PF 10/16/2017  . Influenza,inj,Quad PF,6+ Mos 07/25/2016  . Pneumococcal Polysaccharide-23 10/16/2017   Pertinent  Health Maintenance Due  Topic Date Due  . PNA vac Low Risk Adult (2 of 2 - PCV13) 10/16/2018  . INFLUENZA VACCINE  Completed   Fall Risk  10/25/2017 08/09/2017  Falls in the past year? Yes Yes  Number falls in past yr: 2 or more 2 or more  Injury with Fall? Yes Yes  Risk Factor Category  High Fall Risk High Fall Risk  Risk for fall due to : History of fall(s);Impaired balance/gait;Impaired mobility Impaired mobility;Impaired balance/gait  Follow up Education provided Falls  prevention discussed;Falls evaluation completed      Vitals:   01/11/18 1021  BP: 94/72  Pulse: 73  Resp: 20  Temp: 98.9 F (37.2 C)  TempSrc: Oral  SpO2: 94%  Weight: 167 lb 3.2 oz (75.8 kg)  Height: 5' 11"  (1.803 m)   Body mass index is 23.32 kg/m.  Physical Exam  GENERAL APPEARANCE:  In no acute distress. Normal body habitus SKIN:  Right upper lip with dry blister, left elbow with open wound (chronic), dry and no redness, with cover MOUTH and THROAT: Upper lip with dry blister RESPIRATORY: Breathing is even & unlabored CARDIAC: RRR, no murmur,no extra heart sounds, no edema GI: Abdomen soft, normal BS, no masses, no tenderness EXTREMITIES:  Able to move X 4 extremities, limited ROM on bilateral shoulders PSYCHIATRIC: Alert to self and place, disoriented to time. Affect and behavior are appropriate   Labs reviewed: Recent Labs    10/18/17 0556 10/19/17 0950 10/20/17 0604  NA 126* 128* 129*  K 3.7 3.9 3.8  CL 97* 98* 98*  CO2 22 23 22   GLUCOSE  82 115* 92  BUN 11 9 12   CREATININE 0.54* 0.70 0.52*  CALCIUM 8.4* 8.4* 8.7*  PHOS 3.8 3.8 3.8   Recent Labs    10/18/17 0556 10/19/17 0950 10/20/17 0604  ALBUMIN 2.6* 2.4* 2.6*   Recent Labs    10/16/17 0513 10/18/17 0556 10/19/17 0950  11/17/17 12/12/17 12/13/17  WBC 9.6 8.4 8.4   < > 8.5 10.2 11.2  NEUTROABS  --   --   --    < > 5 6 7   HGB 9.3* 8.8* 8.8*   < > 11.5* 11.7* 12.8*  HCT 27.4* 26.7* 26.9*   < > 34* 35* 40*  MCV 73.7* 75.6* 74.9*  --   --   --   --   PLT 201 184 198   < > 71* 62* 75*   < > = values in this interval not displayed.   Lab Results  Component Value Date   TSH 2.644 10/15/2017   Lab Results  Component Value Date   HGBA1C 5.1 07/17/2017   Lab Results  Component Value Date   CHOL 81 07/17/2017   HDL 32 (L) 07/17/2017   LDLCALC 38 07/17/2017   TRIG 57 07/17/2017   CHOLHDL 2.5 07/17/2017    Assessment/Plan  1. Fever blister - will start Abreva 10% topically to right  upper lip, monitor for infection   2. Chronic systolic CHF (congestive heart failure) (HCC) - no SOB, continue carvedilol 6.25 mg 1 tablet twice a day, Lasix 20 mg 1 tab daily and Aldactone 25 mg 1 tab daily, and losartan 50 mg 1 tab daily   3. Coronary artery disease of native artery of native heart with stable angina pectoris (HCC) - no complaints of chest pain, continue aspirin 81 mg 1 tab daily, isosorbide MN ER 30 mg 1 tab daily, Plavix 75 mg 1 tab daily, and atorvastatin 80 mg 1 tab daily   4. Other polyneuropathy - stable, continue gabapentin 100 mg 1 capsule 3 times a day, oxycodone 5 mg 1 tab every 6 hours when necessary   5. Essential hypertension - well-controlled, continue carvedilol 6.25 mg 1 tab twice a day, and losartan 50 mg 1 tab daily   6. Chronic idiopathic thrombocytopenia (HCC) - platelet 60, referred to hematology, no bleeding/bruising noted      Family/ staff Communication: Discussed plan of care with resident.  Labs/tests ordered:  CBC and BMP  Goals of care:   Long-term care   Durenda Age, NP [facility] [phone] (Monday-Friday 8:00 a.m. - 5:00 p.m.) [phone] (after hours)

## 2018-01-12 LAB — CBC AND DIFFERENTIAL
HEMATOCRIT: 37 — AB (ref 41–53)
HEMOGLOBIN: 12.6 — AB (ref 13.5–17.5)
Neutrophils Absolute: 6
Platelets: 43 — AB (ref 150–399)
WBC: 9.8

## 2018-01-12 LAB — BASIC METABOLIC PANEL
BUN: 12 (ref 4–21)
Creatinine: 0.6 (ref 0.6–1.3)
GLUCOSE: 94
POTASSIUM: 4.5 (ref 3.4–5.3)
Sodium: 132 — AB (ref 137–147)

## 2018-01-16 DIAGNOSIS — R2231 Localized swelling, mass and lump, right upper limb: Secondary | ICD-10-CM | POA: Diagnosis not present

## 2018-01-16 DIAGNOSIS — L988 Other specified disorders of the skin and subcutaneous tissue: Secondary | ICD-10-CM | POA: Diagnosis not present

## 2018-01-18 DIAGNOSIS — D649 Anemia, unspecified: Secondary | ICD-10-CM | POA: Diagnosis not present

## 2018-01-18 DIAGNOSIS — R911 Solitary pulmonary nodule: Secondary | ICD-10-CM | POA: Diagnosis not present

## 2018-01-18 DIAGNOSIS — D519 Vitamin B12 deficiency anemia, unspecified: Secondary | ICD-10-CM | POA: Diagnosis not present

## 2018-01-18 DIAGNOSIS — I10 Essential (primary) hypertension: Secondary | ICD-10-CM | POA: Diagnosis not present

## 2018-01-18 LAB — CBC AND DIFFERENTIAL
HCT: 35 — AB (ref 41–53)
Hemoglobin: 12.3 — AB (ref 13.5–17.5)
Neutrophils Absolute: 7
PLATELETS: 50 — AB (ref 150–399)
WBC: 11.1

## 2018-01-19 ENCOUNTER — Other Ambulatory Visit: Payer: Self-pay | Admitting: Surgery

## 2018-01-19 DIAGNOSIS — R2231 Localized swelling, mass and lump, right upper limb: Secondary | ICD-10-CM

## 2018-02-06 DIAGNOSIS — L988 Other specified disorders of the skin and subcutaneous tissue: Secondary | ICD-10-CM | POA: Diagnosis not present

## 2018-02-10 ENCOUNTER — Ambulatory Visit (INDEPENDENT_AMBULATORY_CARE_PROVIDER_SITE_OTHER): Payer: Medicare Other | Admitting: Internal Medicine

## 2018-02-10 ENCOUNTER — Ambulatory Visit (INDEPENDENT_AMBULATORY_CARE_PROVIDER_SITE_OTHER)
Admission: RE | Admit: 2018-02-10 | Discharge: 2018-02-10 | Disposition: A | Discharge status: Home / Self Care | Payer: Medicare Other | Source: Ambulatory Visit | Attending: Internal Medicine | Admitting: Internal Medicine

## 2018-02-10 ENCOUNTER — Encounter: Payer: Self-pay | Admitting: Internal Medicine

## 2018-02-10 VITALS — BP 106/60 | HR 66 | Ht 71.0 in | Wt 171.0 lb

## 2018-02-10 DIAGNOSIS — J449 Chronic obstructive pulmonary disease, unspecified: Secondary | ICD-10-CM

## 2018-02-10 DIAGNOSIS — J439 Emphysema, unspecified: Secondary | ICD-10-CM | POA: Diagnosis not present

## 2018-02-10 DIAGNOSIS — R05 Cough: Secondary | ICD-10-CM | POA: Diagnosis not present

## 2018-02-10 DIAGNOSIS — J479 Bronchiectasis, uncomplicated: Secondary | ICD-10-CM

## 2018-02-10 DIAGNOSIS — I10 Essential (primary) hypertension: Secondary | ICD-10-CM | POA: Diagnosis not present

## 2018-02-10 DIAGNOSIS — R058 Other specified cough: Secondary | ICD-10-CM

## 2018-02-10 LAB — PULMONARY FUNCTION TEST
DL/VA % pred: 70 %
DL/VA: 3.27 ml/min/mmHg/L
DLCO COR % PRED: 50 %
DLCO COR: 16.86 ml/min/mmHg
DLCO UNC % PRED: 46 %
DLCO unc: 15.66 ml/min/mmHg
FEF 25-75 Post: 1.35 L/sec
FEF 25-75 Pre: 1.22 L/sec
FEF2575-%Change-Post: 9 %
FEF2575-%PRED-PRE: 54 %
FEF2575-%Pred-Post: 60 %
FEV1-%Change-Post: 3 %
FEV1-%PRED-POST: 72 %
FEV1-%Pred-Pre: 70 %
FEV1-Post: 2.26 L
FEV1-Pre: 2.19 L
FEV1FVC-%Change-Post: -4 %
FEV1FVC-%Pred-Pre: 91 %
FEV6-%CHANGE-POST: 3 %
FEV6-%PRED-PRE: 78 %
FEV6-%Pred-Post: 81 %
FEV6-POST: 3.32 L
FEV6-PRE: 3.2 L
FEV6FVC-%Change-Post: -5 %
FEV6FVC-%PRED-POST: 98 %
FEV6FVC-%PRED-PRE: 104 %
FVC-%Change-Post: 7 %
FVC-%PRED-POST: 82 %
FVC-%PRED-PRE: 76 %
FVC-PRE: 3.32 L
FVC-Post: 3.57 L
POST FEV6/FVC RATIO: 93 %
PRE FEV1/FVC RATIO: 66 %
Post FEV1/FVC ratio: 63 %
Pre FEV6/FVC Ratio: 98 %

## 2018-02-10 NOTE — Patient Instructions (Signed)
Please remember to go to the  x-ray department downstairs in the basement  for your tests - we will call you with the results when they are available.     Duoneb up to 4 x daily if needed for sob or cough   Pulmonary follow up is as needed

## 2018-02-10 NOTE — Progress Notes (Signed)
PFT done today. 

## 2018-02-10 NOTE — Progress Notes (Signed)
Subjective:     Patient ID: Vincent Dunlap, male   DOB: 01/12/1941,   MRN: [id number]  HPI  77 yowm quit smoking 09/2017  With GOLD II copd confirmed 02/10/2018    Admit date: 10/14/2017 Discharge date: 10/20/2017  Admitted From: home Disposition:  SNF  Recommendations for Outpatient Follow-up:  1. Follow up with PCP in 1-2 weeks 2. Please obtain BMP/CBC in one week 3. Continue Lasix   Home Health: none Equipment/Devices: none  Discharge Condition: stable CODE STATUS: Full code Diet recommendation: heart healthy  HPI: Per Dr. Stana Bunting, This 77 year old man with coronary artery diseasewho has triple-vessel disease deemed not a candidate for CABG due to comorbidities and frailty, history of heart failure with most recent echo revealing improvement of ejection fraction to 55%, current every day smoker, COPD, peripheral arterial disease, and poor social living situation presenting to the emergency department with chest pain. Patient reports living [place], he is a retired Building control surveyor, he reports needing help with his ADLs. His roommate moved out 4-5 days prior to admission and he has been a difficult position. He receives one meal a day for Meals on Wheels and otherwise snacks or whatever is available. He reports adequate by mouth intake as far as fluids ago. He reports drinking alcohol at times up to 6-7 beers a day, last consumed that amount 2 weeks ago according to his report. He did having difficulty getting out of his chair, citing that when he does he becomes dizzy and lightheaded. On day of admission he developed central chest pain that lasts for 30 minutes, described as sharp, exacerbated by emotional stress. He took a sublingual nitroglycerin and was taken to the hospital for further evaluation. Reports some wheezing, no fevers, chills, productive cough. He reports other symptoms including chronic paresthesias of the feet. Cannot recall which medications he takes at home. ED Course:In  transit via EMS received aspirin 324 mg, as well as 2 sublingual nitroglycerin. Vital signs were relatively unremarkable for normal heart rate normal respiratory rate and systolic blood [id number] mm Hg.lab work remarkable for hyponatremia to 125, BNP of 444, white count 12.7, hemoglobin 9.9, MCV 75, platelet count of 139. Due to his symptomatic improvement in transit he was not given any medications from the emergency department. EKG did not reveal any ST segment elevations. Chest x-ray did not reveal any active pulmonary disease, as well as chronic interstitial lung disease.    Hospital Course: Discharge Diagnoses:  Active Problems:   CAD (coronary artery disease), native coronary artery   Chronic systolic CHF (congestive heart failure) (HCC)   COPD (chronic obstructive pulmonary disease) (HCC)   Chest pain   Hyponatremia   Chest pain in setting of CAD, prior HF (now preserved EF), and peripheral arterial disease -Chest pain free after admission, he was continued on Imdur, aspirin and plavix.Troponin negative. He was evaluated by cardiology recently with a cardiac catheterization which showed diffuse coronary artery disease, however was not deemed a candidate for CABG and was recommended medical management only Hyponatremia-Acute on chronic, probablySIADH, urine sodium more than 40 and urine osmolality more than 300.  He improved in the hospital with water restriction and Lasix, nephrology was consulted and followed patient while hospitalized.  Sodium 1 discharge was 129 which is close to his baseline, continue Lasix on discharge and would recommend fluid restriction as well.  Would recommend repeat the BNP within a week.  He was ruled out for thyroid disease and renal insufficiency, had a normal TSH and a  normal cortisol stim test respectively. Lung nodule - CT chest incidentally discovered a 3.5 mm pulmonary nodule. Need CT follow up as an outpatient. 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  Wound care consulted and recommended "To prevent infection, we will pain the open lesion with a betadine swabstick once daily and allow to air-dry. No dressing needed."    Discharge Instructions      Allergies as of 10/20/2017      Reactions   Chantix [varenicline] Other (See Comments)   Pt states it made him "crazy"              Medication List     STOP taking these medications   amitriptyline 25 MG tablet Commonly known as:  ELAVIL   amLODipine-benazepril 5-20 MG capsule Commonly known as:  LOTREL     TAKE these medications   acetaminophen 325 MG tablet Commonly known as:  TYLENOL Take 2 tablets (650 mg total) by mouth every 4 (four) hours as needed for headache or mild pain.   aspirin 81 MG chewable tablet Chew 1 tablet (81 mg total) by mouth daily.   atorvastatin 80 MG tablet Commonly known as:  LIPITOR Take 1 tablet (80 mg total) by mouth daily at 6 PM.   bisacodyl 10 MG suppository Commonly known as:  DULCOLAX Place 1 suppository (10 mg total) rectally daily as needed for severe constipation.   carvedilol 6.25 MG tablet Commonly known as:  COREG Take 1 tablet (6.25 mg total) by mouth 2 (two) times daily with a meal.   clopidogrel 75 MG tablet Commonly known as:  PLAVIX Take 1 tablet (75 mg total) by mouth daily with breakfast.   cyanocobalamin 100 MCG tablet Take 1 tablet (100 mcg total) by mouth daily. Start taking on:  10/21/2017   esomeprazole 40 MG capsule Commonly known as:  NEXIUM Take 40 mg by mouth daily at 12 noon.    feeding supplement (ENSURE ENLIVE) Liqd Take 237 mLs by mouth 2 (two) times daily between meals.   ferrous sulfate 325 (65 FE) MG tablet Take 1 tablet (325 mg total) by mouth 2 (two) times daily with a meal.   folic acid 1 MG tablet Commonly known as:  FOLVITE Take 1 tablet (1 mg total) by mouth daily. Start taking on:  10/21/2017   furosemide 20 MG tablet Commonly known as:  LASIX Take 1 tablet (20 mg total) by mouth daily. Start taking on:  10/21/2017   isosorbide mononitrate 30 MG 24 hr tablet Commonly known as:  IMDUR Take 1 tablet (30 mg total) by mouth daily.   lisinopril 2.5 MG tablet Commonly known as:  PRINIVIL,ZESTRIL Take 1 tablet (2.5 mg total) by mouth daily.   nicotine 14 mg/24hr patch Commonly known as:  NICODERM CQ - dosed in mg/24 hours Place 1 patch (14 mg total) onto the skin daily.   nitroGLYCERIN 0.4 MG SL tablet Commonly known as:  NITROSTAT Place 1 tablet (0.4 mg total) under the tongue every 5 (five) minutes as needed for chest pain.   oxyCODONE 5 MG immediate release tablet Commonly known as:  ROXICODONE Take 1 tablet (5 mg total) by mouth every 8 (eight)  hours as needed for severe pain.   polyethylene glycol packet Commonly known as:  MIRALAX / GLYCOLAX Take 17 g by mouth 2 (two) times daily.   spironolactone 25 MG tablet Commonly known as:  ALDACTONE Take 25 mg by mouth daily.   thiamine 100 MG tablet Take 1 tablet (100 mg total) by mouth daily. Start taking on:  10/21/2017       12/30/2017  Consultation re cough  Chief Complaint  Patient presents with  . Pulmonary Consult    Referred by Dr. Unice Cobble. He states unsure what the reason for the visit is today. He reports he had been coughing and hearing "bubbling"approx 3 wks ago but "I'ts about gone now".    Dyspnea: really more limited by fatigue/ strength/ foot pain on duoneb per noteds but  not listed in [facility] MAR or Epic MAR Cough:  "just a tickle, it's my  sinuses" Sleep: ok at < 30 degrees hob  rec Stop lisinopril  Start losartan 50 mg daily  Please schedule a follow up office visit in 6 weeks, call sooner if needed with pfts and cxr on return    02/10/2018  f/u ov/Wert re: copd GOLD II  Chief Complaint  Patient presents with  . Follow-up    Breathing is about the same. His cough is non prod. No new co's.    Dyspnea:  W/c when leaves room at snf due weak legs Cough: better/ no more tickle  Sleep: ok flat  SABA use:  None   No obvious day to day or daytime variability or assoc excess/ purulent sputum or mucus plugs or hemoptysis or cp or chest tightness, subjective wheeze or overt sinus or hb symptoms. No unusual exposure hx or h/o childhood pna/ asthma or knowledge of premature birth.  Sleeping ok flat without nocturnal  or early am exacerbation  of respiratory  c/o's or need for noct saba. Also denies any obvious fluctuation of symptoms with weather or environmental changes or other aggravating or alleviating factors except as outlined above   Current Allergies, Complete Past Medical History, Past Surgical History, Family History, and Social History were reviewed in [employer] record.  ROS  The following are not active complaints unless bolded Hoarseness, sore throat, dysphagia, dental problems, itching, sneezing,  nasal congestion or discharge of excess mucus or purulent secretions, ear ache,   fever, chills, sweats, unintended wt loss or wt gain, classically pleuritic or exertional cp,  orthopnea pnd or leg swelling, presyncope, palpitations, abdominal pain, anorexia, nausea, vomiting, diarrhea  or change in bowel habits or change in bladder habits, change in stools or change in urine, dysuria, hematuria,  rash, arthralgias, visual complaints, headache, numbness, weakness or ataxia or problems with walking or coordination,  change in mood/affect or memory.        Current Meds  Medication Sig  . acetaminophen  (TYLENOL) 500 MG tablet Take 500 mg by mouth every 4 (four) hours as needed for mild pain or moderate pain.  Marland Kitchen aspirin 81 MG chewable tablet Chew 1 tablet (81 mg total) by mouth daily.  Marland Kitchen atorvastatin (LIPITOR) 80 MG tablet Take 1 tablet (80 mg total) by mouth daily at 6 PM.  . bisacodyl (DULCOLAX) 10 MG suppository Place 1 suppository (10 mg total) rectally daily as needed for severe constipation.  . carvedilol (COREG) 6.25 MG tablet Take 1 tablet (6.25 mg total) by mouth 2 (two) times daily with a meal.  . clopidogrel (PLAVIX) 75 MG tablet Take  1 tablet (75 mg total) by mouth daily with breakfast.  . esomeprazole (NEXIUM) 40 MG capsule Take 40 mg by mouth daily at 12 noon.   . folic acid (FOLVITE) 1 MG tablet Take 1 tablet (1 mg total) by mouth daily.  . furosemide (LASIX) 20 MG tablet Take 1 tablet (20 mg total) by mouth daily.  Marland Kitchen gabapentin (NEURONTIN) 100 MG capsule Take 100 mg by mouth 3 (three) times daily.  . isosorbide mononitrate (IMDUR) 30 MG 24 hr tablet Take 1 tablet (30 mg total) by mouth daily.  Marland Kitchen losartan (COZAAR) 50 MG tablet Take 50 mg by mouth daily.  . nitroGLYCERIN (NITROSTAT) 0.4 MG SL tablet Place 1 tablet (0.4 mg total) under the tongue every 5 (five) minutes as needed for chest pain.  Marland Kitchen NUTRITIONAL SUPPLEMENT LIQD Take 120 mLs by mouth 2 (two) times daily.  . Nutritional Supplements (ENSURE ACTIVE) LIQD Take 237 mLs by mouth 2 (two) times daily between meals.  Marland Kitchen oxycodone (OXY-IR) 5 MG capsule Take 5 mg by mouth every 6 (six) hours as needed for pain.  . polyethylene glycol (MIRALAX / GLYCOLAX) packet Take 17 g by mouth 2 (two) times daily.  Marland Kitchen senna-docusate (SENOKOT-S) 8.6-50 MG tablet Take 2 tablets by mouth at bedtime.  Marland Kitchen spironolactone (ALDACTONE) 25 MG tablet Take 25 mg by mouth daily.   Marland Kitchen thiamine 100 MG tablet Take 1 tablet (100 mg total) by mouth daily.  . vitamin B-12 (CYANOCOBALAMIN) 1000 MCG tablet Take 1,000 mcg by mouth daily.       .          Objective:   Physical Exam  W/c bound wm nad  02/10/2018          171   12/30/17 165 lb 9.6 oz (75.1 kg)  12/12/17 164 lb (74.4 kg)  11/21/17 159 lb 3.2 oz (72.2 kg)     Vital signs reviewed - Note on arrival 02 sats  99% on RA      HEENT: nl   turbinates bilaterally, and oropharynx. Nl external ear canals without cough reflex - no upper teeth, few residual lower incisors very decayed    Minimal insp and exp rhonchi bilaterally with no wheezing or cough on insp/ exp     HEENT: very poor remaining dentition / nl oropharynx. Nl external ear canals without cough reflex - mild bilateral non-specific turbinate edema     NECK :  without JVD/Nodes/TM/ nl carotid upstrokes bilaterally   LUNGS: no acc muscle use,  Mild barrel  contour chest wall with bilateral  Slightly reduced  bs s audible wheeze and  without cough on insp or exp maneuver and mild   Hyperresonant  to  percussion bilaterally     CV:  RRR  no s3 or murmur or increase in P2, and no edema   ABD:  soft and nontender with pos mid insp Hoover's  in the supine position. No bruits or organomegaly appreciated, bowel sounds nl  MS:   Nl gait/  ext warm without deformities, calf tenderness, cyanosis or clubbing No obvious joint restrictions   SKIN: warm and dry without lesions    NEURO:  alert, approp, nl sensorium with  no motor or cerebellar deficits apparent.         CXR PA and Lateral:   02/10/2018 :    I personally reviewed images and agree with radiology impression as follows:    Stable findings of fibrosis and emphysema. No acute pulmonary process identified. Aortic  atherosclerosis.        Assessment:

## 2018-02-10 NOTE — Assessment & Plan Note (Addendum)
Quit smoking 09/2017 - Spirometry 12/30/2017  FEV1 2.11 (67%)  Ratio 67  With very little if any curvature to f/v p ? duoneb prior?   - PFT's  02/10/2018  FEV1 2.26 (72 % ) ratio 63  p 3 % improvement from saba p nothing prior to study with DLCO  46/50 % corrects to 70  % for alv volume   I had an extended final summary discussion with the patient reviewing all relevant studies completed to date and  lasting 15 to 20 minutes of a 25 minute visit on the following issues:     As I explained to this patient in detail:  although there may be copd present, it may not be clinically relevant:   it does not appear to be limiting activity tolerance any more than a set of worn tires limits someone from driving a car  around a parking lot.  A new set of Michelins might look good but would have no perceived impact on the performance of the car and would not be worth the cost.  That is to say:   this pt is so sedentary I don't recommend aggressive pulmonary rx at this point unless limiting symptoms arise or acute exacerbations become as issue, neither of which is the case now.  I asked the patient to contact this office at any time in the future should either of these problems arise.    duoneb up to qid prn is all that I would rec here.

## 2018-02-11 ENCOUNTER — Encounter: Payer: Self-pay | Admitting: Internal Medicine

## 2018-02-11 NOTE — Assessment & Plan Note (Signed)
Trial off acei 12/30/2017 due to cough/throat tickle > resolved 02/10/2018   Although even in retrospect it may not be clear the ACEi contributed to the pt's symptoms,  Pt improved off them and adding them back at this point or in the future would risk confusion in interpretation of non-specific respiratory symptoms to which this patient is prone  ie  Better not to muddy the waters here.

## 2018-02-11 NOTE — Assessment & Plan Note (Signed)
No recent flares/rec  duoneb to treat copd prn and rx flares with doxy or zpak short course until / unless shows more of a refractory pattern meriting levaquin

## 2018-02-11 NOTE — Assessment & Plan Note (Signed)
Trial off acei 12/30/2017 > minimal residual 02/10/2018   Could consider weaning off ppi at some point in future if not needed for GI purposes

## 2018-02-13 NOTE — Progress Notes (Signed)
ATC  X 2- fast busy tone

## 2018-02-27 DIAGNOSIS — L988 Other specified disorders of the skin and subcutaneous tissue: Secondary | ICD-10-CM | POA: Diagnosis not present

## 2018-02-28 ENCOUNTER — Non-Acute Institutional Stay (SKILLED_NURSING_FACILITY): Payer: Medicare Other | Admitting: Internal Medicine

## 2018-02-28 ENCOUNTER — Encounter: Payer: Self-pay | Admitting: Internal Medicine

## 2018-02-28 DIAGNOSIS — J449 Chronic obstructive pulmonary disease, unspecified: Secondary | ICD-10-CM

## 2018-02-28 DIAGNOSIS — G6289 Other specified polyneuropathies: Secondary | ICD-10-CM | POA: Diagnosis not present

## 2018-02-28 DIAGNOSIS — E538 Deficiency of other specified B group vitamins: Secondary | ICD-10-CM | POA: Insufficient documentation

## 2018-02-28 NOTE — Patient Instructions (Signed)
See assessment and plan under each diagnosis in the problem list and acutely for this visit 

## 2018-02-28 NOTE — Assessment & Plan Note (Signed)
Recheck B12 level 

## 2018-02-28 NOTE — Assessment & Plan Note (Addendum)
Recheck B12 level Increase Gabapentin to 200 mg 3 times a day as trial Consider Cymbalta if suboptimal response

## 2018-02-28 NOTE — Assessment & Plan Note (Addendum)
Cough is essentially stable, rule out extrinsic component Trial of Claritin

## 2018-02-28 NOTE — Progress Notes (Signed)
NURSING HOME LOCATION:  [facility] ROOM NUMBER:  229-A  CODE STATUS:  Full Code  PCP:  Hendricks Limes, MD  [place] [address]  This is a nursing facility follow up of chronic medical diagnoses.  Interim medical record and care since last [place] visit was updated with review of diagnostic studies and change in clinical status since last visit were documented.  HPI: The patient has been a permanent resident at this facility since he was admitted after hospitalization 12/7-12/13/18. He had been admitted for chest pain in the context of known triple-vessel coronary artery disease. He was not felt to be a surgical candidate due to multiple medical comorbidities, advanced age, and failure to thrive. In addition to coronary artery disease he has advanced COPD and was an active smoker. He also has peripheral vascular disease. At home patient had required help with all activities of daily living and mobilization. Prior to admission to SNF he had been receiving one meal from [facility] [employer] [facility] each day but otherwise diet was snacking. He was drinking 6-7 beers per day He had significant anemia with microcytic indices; he has been receiving B12 and iron supplements. 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 This responded to Lasix and water restriction. He has had a chronic right elbow wound for which wound care has been continued at the SNF. Most recent labs revealed an improvement in his hemoglobin to 12.3 and hematocrit 35. The nadir for the hemoglobin was 8.8 on 10/18/17. Sodium had risen to 132. B12 was 128 on 10/16/17. He saw his Pulmonologist in follow up 02/10/18. Because of cough lisinopril had been discontinued 12/30/17 and losartan initiated. Cough had improved with this intervention. Dr Melvyn Novas recommended DuoNeb for COPD as needed and prescribed  azithromycin or doxycycline with flares. If symptoms were to become intractable, Levaquin was to be initiated.  Review of systems: He describes a residual dry, tickling cough. He also has blurry vision and itching and watery eyes. Secretions are clear. He describes chronic dizziness and pressure in the left ear. He has constant pain in his feet described as burning and stinging. He states " it's like with diabetes, but I don't have diabetes". A1c was 5.1% on 07/17/17. He also is constipated.Pain is chronic in hands.ROM of UE limited by rotator cuff issues.  Constitutional: No fever, significant weight change, fatigue  Eyes: No redness, discharge, pain ENT/mouth: No nasal congestion,  purulent discharge, change in hearing, sore throat  Cardiovascular: No chest pain, palpitations, paroxysmal nocturnal dyspnea, claudication, edema  Respiratory: No  hemoptysis, significant snoring, apnea   Gastrointestinal: No heartburn, dysphagia, abdominal pain, nausea /vomiting, rectal bleeding, melena Genitourinary: No dysuria, hematuria, pyuria, incontinence, nocturia Dermatologic: No rash, pruritus, change in appearance of skin Neurologic: No dizziness, headache, syncope, seizures Psychiatric: No significant anxiety, depression, insomnia, anorexia Endocrine: No change in hair/skin/ nails, excessive thirst, excessive hunger, excessive urination  Hematologic/lymphatic: No significant bruising, lymphadenopathy, abnormal bleeding Allergy/immunology: No urticaria, angioedema  Physical exam:  Pertinent or positive findings: Thin , suboptimally nourished in appearance.He is hard of hearing. Maxilla is essentially edentulous. The mandibular teeth reveal poor dentition with plaque and caries. Heart sounds are distant. He has dry low-grade scattered rales. Pedal pulses are decreased. There is marked decreased range of motion of the upper extremities, particularly on the left. He relates this to prior surgery, twice on the  left. 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 The elbow itself is bandaged. He has severe arthritic changes involving the MCP, PIP, DIP joints. There are amateur tattoos over the forearms.  General appearance: no acute distress, increased work of breathing is present.   Lymphatic: No lymphadenopathy about the head, neck, axilla. Eyes: No conjunctival inflammation or lid edema is present. There is no scleral icterus. Ears:  External ear exam shows no significant lesions or deformities.   Nose:  External nasal examination shows no deformity or inflammation. Nasal mucosa are pink and moist without lesions, exudates Oral exam:  Lips and gums are healthy appearing. There is no oropharyngeal erythema or exudate. Neck:  No thyromegaly, masses, tenderness noted.    Heart:  No gallop, murmur, click, rub .  Lungs: without wheezes, rhonchi, rubs. Abdomen:Bowel sounds are normal. Abdomen is soft and nontender with no organomegaly, hernias,masses. GU: deferred  Extremities:  No cyanosis, clubbing,edema  Skin: Warm & dry w/o tenting. No significant rash.  See summary under each active problem in the Problem List with associated updated therapeutic plan.

## 2018-03-01 ENCOUNTER — Other Ambulatory Visit: Payer: Self-pay

## 2018-03-01 DIAGNOSIS — R509 Fever, unspecified: Secondary | ICD-10-CM | POA: Diagnosis not present

## 2018-03-01 DIAGNOSIS — D519 Vitamin B12 deficiency anemia, unspecified: Secondary | ICD-10-CM | POA: Diagnosis not present

## 2018-03-01 DIAGNOSIS — I10 Essential (primary) hypertension: Secondary | ICD-10-CM | POA: Diagnosis not present

## 2018-03-01 DIAGNOSIS — D649 Anemia, unspecified: Secondary | ICD-10-CM | POA: Diagnosis not present

## 2018-03-01 MED ORDER — OXYCODONE HCL 5 MG PO CAPS: 5.0000 mg | ORAL_CAPSULE | Freq: Three times a day (TID) | ORAL | 0 refills | Status: DC | PRN

## 2018-03-01 NOTE — Telephone Encounter (Signed)
Hard script given to nurse by NP. 

## 2018-03-16 DIAGNOSIS — R1311 Dysphagia, oral phase: Secondary | ICD-10-CM | POA: Diagnosis not present

## 2018-03-16 DIAGNOSIS — J449 Chronic obstructive pulmonary disease, unspecified: Secondary | ICD-10-CM | POA: Diagnosis not present

## 2018-03-20 DIAGNOSIS — L988 Other specified disorders of the skin and subcutaneous tissue: Secondary | ICD-10-CM | POA: Diagnosis not present

## 2018-03-30 ENCOUNTER — Non-Acute Institutional Stay (SKILLED_NURSING_FACILITY): Payer: Medicare Other | Admitting: Adult Health

## 2018-03-30 ENCOUNTER — Encounter: Payer: Self-pay | Admitting: Adult Health

## 2018-03-30 DIAGNOSIS — I5022 Chronic systolic (congestive) heart failure: Secondary | ICD-10-CM

## 2018-03-30 DIAGNOSIS — T148XXA Other injury of unspecified body region, initial encounter: Secondary | ICD-10-CM | POA: Diagnosis not present

## 2018-03-30 DIAGNOSIS — I25118 Atherosclerotic heart disease of native coronary artery with other forms of angina pectoris: Secondary | ICD-10-CM | POA: Diagnosis not present

## 2018-03-30 DIAGNOSIS — I10 Essential (primary) hypertension: Secondary | ICD-10-CM | POA: Diagnosis not present

## 2018-03-30 DIAGNOSIS — G6289 Other specified polyneuropathies: Secondary | ICD-10-CM | POA: Diagnosis not present

## 2018-03-30 DIAGNOSIS — K5901 Slow transit constipation: Secondary | ICD-10-CM | POA: Diagnosis not present

## 2018-03-30 DIAGNOSIS — D693 Immune thrombocytopenic purpura: Secondary | ICD-10-CM

## 2018-03-30 DIAGNOSIS — J449 Chronic obstructive pulmonary disease, unspecified: Secondary | ICD-10-CM

## 2018-03-30 NOTE — Progress Notes (Signed)
Location:  [facility] Room Number: 229-A Place of Service:  SNF (31) Provider:  Durenda Age, NP  Patient Care Team: Hendricks Limes, MD as PCP - General (Internal Medicine) Thornell Sartorius, MD as Consulting Physician (Otolaryngology)  Extended Emergency Contact Information Primary Emergency Contact: Glynis Smiles [place] Phone: [phone] Relation: Other Secondary Emergency Contact: Leda Min [place] Phone: [phone] Mobile Phone: [phone] Relation: Other  Code Status:  Full Code  Goals of care: Advanced Directive information Advanced Directives 10/25/2017  Does Patient Have a Medical Advance Directive? No  Would patient like information on creating a medical advance directive? No - Patient declined     Chief Complaint  Patient presents with  . Medical Management of Chronic Issues    Routine [facility] SNF visit    HPI:  Pt is a 77 y.o. male seen today for medical management of chronic diseases.  He is a long-term care resident of [facility].  He has a PMH of ischemic cardiomyopathy, CHF, hypertension, and history of tobacco and alcohol abuse. He was seen in the room today and was noted to have black bruises on his chin down to his neck. No SOB. He had teeth extraction of all remaining teeth by Dr. Christia Reading on 03/28/18. Dental progress notes stated that stitches were placed and will come out in 3-6 weeks. No bleeding noted. He was ordered to start Ibuprofen PRN for pain. Labs were reviewed and latest platelets 50. Ibuprofen was discontinued.    Past Medical History:  Diagnosis Date  . CAD (coronary artery disease) 07/2016   3 vessel disease,  evaluated by Dr Servando Snare and felt to be a poor surgical candidate  . CHF (congestive heart failure) ([place])   . Hypertension   . Ischemic cardiomyopathy 2017   EF 30%  . Tobacco abuse    Past Surgical History:    Procedure Laterality Date  . ABDOMINAL AORTOGRAM W/LOWER EXTREMITY Right 07/26/2017   Procedure: ABDOMINAL AORTOGRAM W/LOWER EXTREMITY;  Surgeon: Serafina Mitchell, MD;  Location: [facility];  Service: Cardiovascular;  Laterality: Right;  . AMPUTATION Right 07/28/2017   Procedure: AMPUTATION RIGHT Second TOE;  Surgeon: Waynetta Sandy, MD;  Location: [facility];  Service: Vascular;  Laterality: Right;  . CARDIAC CATHETERIZATION N/A 07/19/2016   Procedure: Right/Left Heart Cath and Coronary Angiography;  Surgeon: Troy Sine, MD;  Location: [facility];  Service: Cardiovascular;  Laterality: N/A;  . INGUINAL HERNIA REPAIR Bilateral   . PERIPHERAL VASCULAR INTERVENTION Right 07/26/2017   Procedure: PERIPHERAL VASCULAR INTERVENTION;  Surgeon: Serafina Mitchell, MD;  Location: [facility];  Service: Cardiovascular;  Laterality: Right;    Allergies  Allergen Reactions  . Chantix [Varenicline] Other (See Comments)    Pt states it made him "crazy"    Outpatient Encounter Medications as of 03/30/2018  Medication Sig  . acetaminophen (TYLENOL) 500 MG tablet Take 500 mg by mouth every 4 (four) hours as needed for mild pain or moderate pain.  [facility] Kitchen aspirin 81 MG chewable tablet Chew 1 tablet (81 mg total) by mouth daily.  [facility] Kitchen atorvastatin (LIPITOR) 80 MG tablet Take 1 tablet (80 mg total) by mouth daily at 6 PM.  . bisacodyl (DULCOLAX) 10 MG suppository Place 1 suppository (10 mg total) rectally daily as needed for severe constipation.  . carvedilol (COREG) 6.25 MG tablet Take 1 tablet (6.25 mg total) by mouth 2 (two) times daily with  a meal.  . chlorhexidine (PERIDEX) 0.12 % solution Use as directed 15 mLs in the mouth or throat 2 (two) times daily. Rinse for 30 seconds AM and PM x2 weeks  . clopidogrel (PLAVIX) 75 MG tablet Take 1 tablet (75 mg total) by mouth daily with breakfast.  . esomeprazole (NEXIUM) 40 MG capsule Take 40 mg by mouth daily at 12 noon.   . folic acid  (FOLVITE) 1 MG tablet Take 1 tablet (1 mg total) by mouth daily.  . furosemide (LASIX) 20 MG tablet Take 1 tablet (20 mg total) by mouth daily.  Mar[place] Kitchen gabapentin (NEURONTIN) 100 MG capsule Take 200 mg by mouth 3 (three) times daily.   Marland Kitchen ibuprofen (ADVIL,MOTRIN) 600 MG tablet Take 600 mg by mouth every 6 (six) hours as needed.  Marland Kitchen ipratropium-albuterol (DUONEB) 0.5-2.5 (3) MG/3ML SOLN Take 3 mLs by nebulization every 4 (four) hours as needed (SOB).  . isosorbide mononitrate (IMDUR) 30 MG 24 hr tablet Take 1 tablet (30 mg total) by mouth daily.  Mar[place] Kitchen loratadine (CLARITIN) 10 MG tablet Take 10 mg by mouth daily.  Mar[place] Kitchen losartan (COZAAR) 50 MG tablet Take 50 mg by mouth daily.  . nitroGLYCERIN (NITROSTAT) 0.4 MG SL tablet Place 1 tablet (0.4 mg total) under the tongue every 5 (five) minutes as needed for chest pain.  [place] Kitchen NUTRITIONAL SUPPLEMENT LIQD Take 120 mLs by mouth 2 (two) times daily. MedPass  . Nutritional Supplements (ENSURE ACTIVE) LIQD Take 237 mLs by mouth 2 (two) times daily between meals.  [place] Kitchen oxycodone (OXY-IR) 5 MG capsule Take 5 mg by mouth every 6 (six) hours as needed for pain.  Mar[place] Kitchen penicillin v potassium (VEETID) 500 MG tablet Take 500 mg by mouth 4 (four) times daily.  . polyethylene glycol (MIRALAX / GLYCOLAX) packet Take 17 g by mouth 2 (two) times daily.  Mar[place] Kitchen senna-docusate (SENOKOT-S) 8.6-50 MG tablet Take 2 tablets by mouth at bedtime.  [place] Kitchen spironolactone (ALDACTONE) 25 MG tablet Take 25 mg by mouth daily.   Mar[place] Kitchen thiamine 100 MG tablet Take 1 tablet (100 mg total) by mouth daily.  . vitamin B-12 (CYANOCOBALAMIN) 1000 MCG tablet Take 1,000 mcg by mouth daily.  . [DISCONTINUED] oxycodone (OXY-IR) 5 MG capsule Take 1 capsule (5 mg total) by mouth every 8 (eight) hours as needed for pain.   No facility-administered encounter medications on file as of 03/30/2018.     Review of Systems  GENERAL: No change in appetite, no fatigue, no weight changes, no fever, chills or weakness MOUTH and THROAT: no  bleeding  RESPIRATORY: no cough, SOB, DOE, wheezing, hemoptysis CARDIAC: No chest pain, edema or palpitations GI: No abdominal pain, diarrhea, constipation, heart burn, nausea or vomiting GU: +constipation PSYCHIATRIC: Denies feelings of depression or anxiety. No report of hallucinations, insomnia, paranoia, or agitation   Immunization History  Administered Date(s) Administered  . Influenza, High Dose Seasonal PF 10/16/2017  . Influenza,inj,Quad PF,6+ Mos 07/25/2016  . Pneumococcal Polysaccharide-23 10/16/2017   Pertinent  Health Maintenance Due  Topic Date Due  . INFLUENZA VACCINE  06/08/2018  . PNA vac Low Risk Adult (2 of 2 - PCV13) 10/16/2018   Fall Risk  10/25/2017 08/09/2017  Falls in the past year? Yes Yes  Number falls in past yr: 2 or more 2 or more  Injury with Fall? Yes Yes  Risk Factor Category  High Fall Risk High Fall Risk  Risk for fall due to : History of fall(s);Impaired balance/gait;Impaired mobility Impaired mobility;Impaired balance/gait  Follow up Education provided  Falls prevention discussed;Falls evaluation completed     Vitals:   03/30/18 1027  BP: 120/68  Pulse: 68  Resp: 20  Temp: 98.6 F (37 C)  TempSrc: Oral  SpO2: 98%  Weight: 175 lb (79.4 kg)  Height: 5' 11"  (1.803 m)   Body mass index is 24.41 kg/m.  Physical Exam  GENERAL APPEARANCE: Well nourished. In no acute distress. Normal body habitus SKIN:  Black bruising on his chin down to his neck, right elbow has 2 hard mass (chronic) MOUTH and THROAT: Lips are without lesions. Oral mucosa is moist and without lesions. Tongue is normal in shape, size, and color and without lesions RESPIRATORY: Breathing is even & unlabored, BS CTAB CARDIAC: RRR, no murmur,no extra heart sounds, no edema GI: Abdomen soft, normal BS, no masses, no tenderness EXTREMITIES:  Able to move X 4 extremities PSYCHIATRIC: Alert and oriented X 3. Affect and behavior are appropriate   Labs reviewed: Recent Labs     10/18/17 0556 10/19/17 0950 10/20/17 0604 01/12/18  NA 126* 128* 129* 132*  K 3.7 3.9 3.8 4.5  CL 97* 98* 98*  --   CO2 22 23 22   --   GLUCOSE 82 115* 92  --   BUN 11 9 12 12   CREATININE 0.54* 0.70 0.52* 0.6  CALCIUM 8.4* 8.4* 8.7*  --   PHOS 3.8 3.8 3.8  --    Recent Labs    10/18/17 0556 10/19/17 0950 10/20/17 0604  ALBUMIN 2.6* 2.4* 2.6*   Recent Labs    10/16/17 0513 10/18/17 0556 10/19/17 0950  12/13/17 01/12/18 01/18/18  WBC 9.6 8.4 8.4   < > 11.2 9.8 11.1  NEUTROABS  --   --   --    < > 7 6 7   HGB 9.3* 8.8* 8.8*   < > 12.8* 12.6* 12.3*  HCT 27.4* 26.7* 26.9*   < > 40* 37* 35*  MCV 73.7* 75.6* 74.9*  --   --   --   --   PLT 201 184 198   < > 75* 43* 50*   < > = values in this interval not displayed.   Lab Results  Component Value Date   TSH 2.644 10/15/2017   Lab Results  Component Value Date   HGBA1C 5.1 07/17/2017   Lab Results  Component Value Date   CHOL 81 07/17/2017   HDL 32 (L) 07/17/2017   LDLCALC 38 07/17/2017   TRIG 57 07/17/2017   CHOLHDL 2.5 07/17/2017    Assessment/Plan  1. Chronic idiopathic thrombocytopenia (HCC) - had a recent teeth extraction, no active bleeding noted at this time, Ibuprofen was discontinued, monitor for SOB, will re-check cbc Lab Results  Component Value Date   PLT 50 (A) 01/18/2018     2. Essential hypertension - continue Carvedilol 6.25 mg 1 tab BID, Losartan 50 mg daily   3. Other polyneuropathy - stable, continue Gabapentin 100 mg 2 capsules = 200 mg TID and Oxycodone 5 mg Q 6 hours PRN   4. Coronary artery disease of native artery of native heart with stable angina pectoris (HCC) - no complaints of chest pains, continue NTG PRN, ASA 81 mg daily, Isosorbide MN ER 30 mg daily, Plavix 75 mg daily, Atorvastatin 80 mg daily   5. COPD GOLD II - no SOB nor wheezing,  Continue Ipratropium-Albuterol neb QID PRN   6. Chronic systolic congestive heart failure (HCC) - no SOB, continue Lasix 20 mg daily,  Spironolactone 25 mg daily,  Carvedilol 6.25 mg 1 tab BID  7. Slow transit constipation - complained of slight constipation, continue Bisacodyl suppository PRN, Miralax 17 gm BID, will increase Senna-S from 2 tabs  Q HS to BID  8. Bruising - from a recent teeth extraction and latest platelet 50, monitor skin for additional bruising/bleeding, continue Peridex oral rinse BID X 2 weeks, Penicillin VK 500 mg 1 tab QID X 2 weeks    Family/ staff Communication:  Discussed plan of care with resident.  Labs/tests ordered:  CBC  Goals of care:   Long-term care.   Durenda Age, NP [facility] [phone] (Monday-Friday 8:00 a.m. - 5:00 p.m.) [phone] (after hours)

## 2018-03-31 DIAGNOSIS — Z79899 Other long term (current) drug therapy: Secondary | ICD-10-CM | POA: Diagnosis not present

## 2018-03-31 DIAGNOSIS — D649 Anemia, unspecified: Secondary | ICD-10-CM | POA: Diagnosis not present

## 2018-03-31 LAB — CBC AND DIFFERENTIAL
HCT: 38 — AB (ref 41–53)
Hemoglobin: 12.5 — AB (ref 13.5–17.5)
NEUTROS ABS: 7
PLATELETS: 42 — AB (ref 150–399)
WBC: 11.2

## 2018-04-04 DIAGNOSIS — D649 Anemia, unspecified: Secondary | ICD-10-CM | POA: Diagnosis not present

## 2018-04-04 DIAGNOSIS — I10 Essential (primary) hypertension: Secondary | ICD-10-CM | POA: Diagnosis not present

## 2018-04-04 LAB — CBC AND DIFFERENTIAL
HEMATOCRIT: 41 (ref 41–53)
HEMOGLOBIN: 13.6 (ref 13.5–17.5)
Neutrophils Absolute: 6
Platelets: 16 — AB (ref 150–399)
WBC: 9.9

## 2018-04-05 DIAGNOSIS — Z79899 Other long term (current) drug therapy: Secondary | ICD-10-CM | POA: Diagnosis not present

## 2018-04-19 LAB — BASIC METABOLIC PANEL
BUN: 21 (ref 4–21)
CREATININE: 0.8 (ref 0.6–1.3)
Glucose: 88
POTASSIUM: 4.9 (ref 3.4–5.3)
SODIUM: 138 (ref 137–147)

## 2018-04-19 LAB — VITAMIN B12: Vitamin B-12: 588

## 2018-04-19 LAB — IRON,TIBC AND FERRITIN PANEL
%SAT: 21.76
Iron: 67
TIBC: 306
UIBC: 239

## 2018-04-19 LAB — LIPID PANEL
Cholesterol: 134 (ref 0–200)
HDL: 39 (ref 35–70)
LDL Cholesterol: 75
LDl/HDL Ratio: 3.5
Triglycerides: 103 (ref 40–160)

## 2018-04-19 LAB — HEPATIC FUNCTION PANEL
ALK PHOS: 136 — AB (ref 25–125)
ALT: 9 — AB (ref 10–40)
AST: 12 — AB (ref 14–40)
Bilirubin, Total: 0.3

## 2018-04-19 LAB — HEMOGLOBIN A1C: Hemoglobin A1C: 5.7

## 2018-04-19 LAB — TSH: TSH: 2.95 (ref 0.41–5.90)

## 2018-04-19 LAB — CBC AND DIFFERENTIAL: Platelets: 59 — AB (ref 150–399)

## 2018-04-19 LAB — VITAMIN D 25 HYDROXY (VIT D DEFICIENCY, FRACTURES): VIT D 25 HYDROXY: 20.54

## 2018-04-20 ENCOUNTER — Encounter: Payer: Self-pay | Admitting: Internal Medicine

## 2018-04-20 ENCOUNTER — Non-Acute Institutional Stay (SKILLED_NURSING_FACILITY): Payer: Medicare Other

## 2018-04-20 DIAGNOSIS — Z0000 Encounter for general adult medical examination without abnormal findings: Secondary | ICD-10-CM

## 2018-04-20 DIAGNOSIS — I739 Peripheral vascular disease, unspecified: Secondary | ICD-10-CM | POA: Insufficient documentation

## 2018-04-20 NOTE — Progress Notes (Addendum)
Subjective:   Vincent Dunlap is a 77 y.o. male who presents for Medicare Annual/Subsequent preventive examination at [facility]  Last AWV-04/15/2017       Objective:    Vitals: BP 125/64 (BP Location: Left Arm, Patient Position: Sitting)   Pulse 75   Temp 97.9 F (36.6 C) (Oral)   Ht 5\' 11"  (1.803 m)   Wt 174 lb (78.9 kg)   BMI 24.27 kg/m   Body mass index is 24.27 kg/m.  Advanced Directives 04/20/2018 10/25/2017 10/15/2017 08/09/2017 07/16/2017 07/17/2016  Does Patient Have a Medical Advance Directive? No No No No No Yes;No  Would patient like information on creating a medical advance directive? No - Patient declined No - Patient declined No - Patient declined No - Patient declined No - Patient declined No - patient declined information    Tobacco Social History   Tobacco Use  Smoking Status Former Smoker  . Packs/day: 1.00  . Types: Cigarettes  . Last attempt to quit: 09/08/2017  . Years since quitting: 0.6  Smokeless Tobacco Former Engineer, structural given: Not Answered   Clinical Intake:     Pain : 0-10 Pain Score: 8  Pain Type: Chronic pain Pain Location: Foot Pain Orientation: Right, Left Pain Descriptors / Indicators: Aching Pain Onset: More than a month ago Pain Frequency: Intermittent     Nutritional Risks: None Diabetes: Yes CBG done?: No Did pt. bring in CBG monitor from home?: No  How often do you need to have someone help you when you read instructions, pamphlets, or other written materials from your doctor or pharmacy?: 2 - Rarely  Interpreter Needed?: No  Information entered by :: Tyson Dense, RN  Past Medical History:  Diagnosis Date  . CAD (coronary artery disease) 07/2016   3 vessel disease,  evaluated by Dr Servando Snare and felt to be a poor surgical candidate  . CHF (congestive heart failure) ([place])   . Hypertension   . Ischemic cardiomyopathy 2017   EF 30%  . Tobacco abuse    Past Surgical History:  Procedure  Laterality Date  . ABDOMINAL AORTOGRAM W/LOWER EXTREMITY Right 07/26/2017   Procedure: ABDOMINAL AORTOGRAM W/LOWER EXTREMITY;  Surgeon: Serafina Mitchell, MD;  Location: [facility];  Service: Cardiovascular;  Laterality: Right;  . AMPUTATION Right 07/28/2017   Procedure: AMPUTATION RIGHT Second TOE;  Surgeon: Waynetta Sandy, MD;  Location: [place] [facility];  Service: Vascular;  Laterality: Right;  . CARDIAC CATHETERIZATION N/A 07/19/2016   Procedure: Right/Left Heart Cath and Coronary Angiography;  Surgeon: Troy Sine, MD;  Location: [facility];  Service: Cardiovascular;  Laterality: N/A;  . INGUINAL HERNIA REPAIR Bilateral   . PERIPHERAL VASCULAR INTERVENTION Right 07/26/2017   Procedure: PERIPHERAL VASCULAR INTERVENTION;  Surgeon: Serafina Mitchell, MD;  Location: [facility];  Service: Cardiovascular;  Laterality: Right;   Family History  Problem Relation Age of Onset  . Alzheimer's disease Mother   . Diabetes Mother        Diabetes in the maternal side of the family but not in mother  . Heart disease Neg Hx   . Cancer Neg Hx    Social History   Socioeconomic History  . Marital status: Single    Spouse name: Not on file  . Number of children: Not on file  . Years of education: Not on file  . Highest education level: Not on file  Occupational History  . Not on  file  Social Needs  . Financial resource strain: Not hard at all  . Food insecurity:    Worry: Never true    Inability: Never true  . Transportation needs:    Medical: No    Non-medical: No  Tobacco Use  . Smoking status: Former Smoker    Packs/day: 1.00    Types: Cigarettes    Last attempt to quit: 09/08/2017    Years since quitting: 0.6  . Smokeless tobacco: Former Network engineer and Sexual Activity  . Alcohol use: No    Comment: Former heavy binge drinker until 2014  . Drug use: No  . Sexual activity: Not on file  Lifestyle  . Physical activity:    Days per week: 0 days    Minutes  per session: 0 min  . Stress: Only a little  Relationships  . Social connections:    Talks on phone: Once a week    Gets together: Once a week    Attends religious service: Never    Active member of club or organization: No    Attends meetings of clubs or organizations: Never    Relationship status: Never married  Other Topics Concern  . Not on file  Social History Narrative  . Not on file    Outpatient Encounter Medications as of 04/20/2018  Medication Sig  . acetaminophen (TYLENOL) 500 MG tablet Take 500 mg by mouth every 4 (four) hours as needed for mild pain or moderate pain.  Marland Kitchen aspirin 81 MG chewable tablet Chew 1 tablet (81 mg total) by mouth daily.  Marland Kitchen atorvastatin (LIPITOR) 80 MG tablet Take 1 tablet (80 mg total) by mouth daily at 6 PM.  . bisacodyl (DULCOLAX) 10 MG suppository Place 1 suppository (10 mg total) rectally daily as needed for severe constipation.  . carvedilol (COREG) 6.25 MG tablet Take 1 tablet (6.25 mg total) by mouth 2 (two) times daily with a meal.  . clopidogrel (PLAVIX) 75 MG tablet Take 1 tablet (75 mg total) by mouth daily with breakfast.  . esomeprazole (NEXIUM) 40 MG capsule Take 40 mg by mouth daily at 12 noon.   . folic acid (FOLVITE) 1 MG tablet Take 1 tablet (1 mg total) by mouth daily.  . furosemide (LASIX) 20 MG tablet Take 1 tablet (20 mg total) by mouth daily.  Marland Kitchen gabapentin (NEURONTIN) 100 MG capsule Take 200 mg by mouth 3 (three) times daily.   Marland Kitchen ibuprofen (ADVIL,MOTRIN) 600 MG tablet Take 600 mg by mouth every 6 (six) hours as needed.  Marland Kitchen ipratropium-albuterol (DUONEB) 0.5-2.5 (3) MG/3ML SOLN Take 3 mLs by nebulization every 4 (four) hours as needed (SOB).  . isosorbide mononitrate (IMDUR) 30 MG 24 hr tablet Take 1 tablet (30 mg total) by mouth daily.  Marland Kitchen loratadine (CLARITIN) 10 MG tablet Take 10 mg by mouth daily.  Marland Kitchen losartan (COZAAR) 50 MG tablet Take 50 mg by mouth daily.  . nitroGLYCERIN (NITROSTAT) 0.4 MG SL tablet Place 1 tablet (0.4 mg  total) under the tongue every 5 (five) minutes as needed for chest pain.  Marland Kitchen NUTRITIONAL SUPPLEMENT LIQD Take 120 mLs by mouth 2 (two) times daily. Med[employer]  . Nutritional Supplements (ENSURE ACTIVE) LIQD Take 237 mLs by mouth 2 (two) times daily between meals.  Marland Kitchen oxycodone (OXY-IR) 5 MG capsule Take 5 mg by mouth every 6 (six) hours as needed for pain.  . polyethylene glycol (MIRALAX / GLYCOLAX) packet Take 17 g by mouth 2 (two) times daily.  Marland Kitchen senna-docusate (  SENOKOT-S) 8.6-50 MG tablet Take 2 tablets by mouth at bedtime.  Marland Kitchen spironolactone (ALDACTONE) 25 MG tablet Take 25 mg by mouth daily.   Marland Kitchen thiamine 100 MG tablet Take 1 tablet (100 mg total) by mouth daily.  . vitamin B-12 (CYANOCOBALAMIN) 1000 MCG tablet Take 1,000 mcg by mouth daily.   No facility-administered encounter medications on file as of 04/20/2018.     Activities of Daily Living In your present state of health, do you have any difficulty performing the following activities: 04/20/2018 10/25/2017  Hearing? Y Y  Comment - -  Vision? N N  Difficulty concentrating or making decisions? Y N  Walking or climbing stairs? Y Y  Dressing or bathing? Y Y  Doing errands, shopping? Tempie Donning  Preparing Food and eating ? Y -  Using the Toilet? Y -  Comment - -  In the past six months, have you accidently leaked urine? N -  Do you have problems with loss of bowel control? N -  Managing your Medications? Y -  Managing your Finances? Y -  Housekeeping or managing your Housekeeping? Y -  Some recent data might be hidden    Patient Care Team: Hendricks Limes, MD as PCP - General (Internal Medicine) Thornell Sartorius, MD as Consulting Physician (Otolaryngology)   Assessment:   This is a routine wellness examination for [place].  Exercise Activities and Dietary recommendations Current Exercise Habits: The patient does not participate in regular exercise at present, Exercise limited by: orthopedic condition(s)  Goals    None      Fall  Risk Fall Risk  04/20/2018 10/25/2017 08/09/2017  Falls in the past year? No Yes Yes  Number falls in past yr: - 2 or more 2 or more  Injury with Fall? - Yes Yes  Risk Factor Category  - High Fall Risk High Fall Risk  Risk for fall due to : - History of fall(s);Impaired balance/gait;Impaired mobility Impaired mobility;Impaired balance/gait  Follow up - Education provided Falls prevention discussed;Falls evaluation completed   Is the patient's home free of loose throw rugs in walkways, pet beds, electrical cords, etc?   yes      Grab bars in the bathroom? yes      Handrails on the stairs?   yes      Adequate lighting?   yes  Depression Screen PHQ 2/9 Scores 04/20/2018 12/07/2017 10/25/2017 08/09/2017  PHQ - 2 Score 3 3 0 0  PHQ- 9 Score 4 9 - -    Cognitive Function     6CIT Screen 04/20/2018  What Year? 0 points  What month? 0 points  What time? 0 points  Count back from 20 0 points  Months in reverse 0 points  Repeat phrase 0 points  Total Score 0    Immunization History  Administered Date(s) Administered  . Influenza, High Dose Seasonal PF 10/16/2017  . Influenza,inj,Quad PF,6+ Mos 07/25/2016  . Pneumococcal Polysaccharide-23 10/16/2017    Qualifies for Shingles Vaccine? Not in past records  Screening Tests Health Maintenance  Topic Date Due  . TETANUS/TDAP  11/21/2018 (Originally 08/03/1960)  . INFLUENZA VACCINE  06/08/2018  . PNA vac Low Risk Adult (2 of 2 - PCV13) 10/16/2018   Cancer Screenings: Lung: Low Dose CT Chest recommended if Age 26-80 years, 30 pack-year currently smoking OR have quit w/in 15years. Patient does qualify. Colorectal: excluded due to age  Additional Screenings:  Hepatitis C Screening:declined  TDAP due-ordered    Plan:  I have personally reviewed and addressed the Medicare Annual Wellness questionnaire and have noted the following in the patient's chart:  A. Medical and social history B. Use of alcohol, tobacco or illicit drugs   C. Current medications and supplements D. Functional ability and status E.  Nutritional status F.  Physical activity G. Advance directives H. List of other physicians I.  Hospitalizations, surgeries, and ER visits in previous 12 months J.  [place] to include hearing, vision, cognitive, depression L. Referrals and appointments - none  In addition, I have reviewed and discussed with patient certain preventive protocols, quality metrics, and best practice recommendations. A written personalized care plan for preventive services as well as general preventive health recommendations were provided to patient.  See attached scanned questionnaire for additional information.   Signed,   Tyson Dense, RN Nurse Health Advisor  Patient Concerns: Requested to be back on Amitriptyline for depression. His PHQ-9 score was 4 I have personally reviewed the health advisor's clinical note, was available for consultation, and agree with the assessment and plan as written. Ask Psych NP to assess Amitriptyline.Hendricks Limes M.D., FACP, [facility]

## 2018-04-20 NOTE — Patient Instructions (Signed)
Mr. Vincent Dunlap , Thank you for taking time to come for your Medicare Wellness Visit. I appreciate your ongoing commitment to your health goals. Please review the following plan we discussed and let me know if I can assist you in the future.   Screening recommendations/referrals: Colonoscopy excluded, over age 77 Recommended yearly ophthalmology/optometry visit for glaucoma screening and checkup Recommended yearly dental visit for hygiene and checkup  Vaccinations: Influenza vaccine up to date, due 2019 fall seaosn Pneumococcal vaccine up to date. 23 due 10/16/2018 Tdap vaccine due, ordered Shingles vaccine not in past records    Advanced directives: In chart  Conditions/risks identified: none  Next appointment: Dr. Linna Darner makes rounds  Preventive Care 47 Years and Older, Male Preventive care refers to lifestyle choices and visits with your health care provider that can promote health and wellness. What does preventive care include?  A yearly physical exam. This is also called an annual well check.  Dental exams once or twice a year.  Routine eye exams. Ask your health care provider how often you should have your eyes checked.  Personal lifestyle choices, including:  Daily care of your teeth and gums.  Regular physical activity.  Eating a healthy diet.  Avoiding tobacco and drug use.  Limiting alcohol use.  Practicing safe sex.  Taking low doses of aspirin every day.  Taking vitamin and mineral supplements as recommended by your health care provider. What happens during an annual well check? The services and screenings done by your health care provider during your annual well check will depend on your age, overall health, lifestyle risk factors, and family history of disease. Counseling  Your health care provider may ask you questions about your:  Alcohol use.  Tobacco use.  Drug use.  Emotional well-being.  Home and relationship well-being.  Sexual  activity.  Eating habits.  History of falls.  Memory and ability to understand (cognition).  Work and work Statistician. Screening  You may have the following tests or measurements:  Height, weight, and BMI.  Blood pressure.  Lipid and cholesterol levels. These may be checked every 5 years, or more frequently if you are over 31 years old.  Skin check.  Lung cancer screening. You may have this screening every year starting at age 14 if you have a 30-pack-year history of smoking and currently smoke or have quit within the past 15 years.  Fecal occult blood test (FOBT) of the stool. You may have this test every year starting at age 80.  Flexible sigmoidoscopy or colonoscopy. You may have a sigmoidoscopy every 5 years or a colonoscopy every 10 years starting at age 15.  Prostate cancer screening. Recommendations will vary depending on your family history and other risks.  Hepatitis C blood test.  Hepatitis B blood test.  Sexually transmitted disease (STD) testing.  Diabetes screening. This is done by checking your blood sugar (glucose) after you have not eaten for a while (fasting). You may have this done every 1-3 years.  Abdominal aortic aneurysm (AAA) screening. You may need this if you are a current or former smoker.  Osteoporosis. You may be screened starting at age 37 if you are at high risk. Talk with your health care provider about your test results, treatment options, and if necessary, the need for more tests. Vaccines  Your health care provider may recommend certain vaccines, such as:  Influenza vaccine. This is recommended every year.  Tetanus, diphtheria, and acellular pertussis (Tdap, Td) vaccine. You may need a Td  booster every 10 years.  Zoster vaccine. You may need this after age 62.  Pneumococcal 13-valent conjugate (PCV13) vaccine. One dose is recommended after age 19.  Pneumococcal polysaccharide (PPSV23) vaccine. One dose is recommended after age  67. Talk to your health care provider about which screenings and vaccines you need and how often you need them. This information is not intended to replace advice given to you by your health care provider. Make sure you discuss any questions you have with your health care provider. Document Released: 11/21/2015 Document Revised: 07/14/2016 Document Reviewed: 08/26/2015 Elsevier Interactive Patient Education  2017 [place] Prevention in the Home Falls can cause injuries. They can happen to people of all ages. There are many things you can do to make your home safe and to help prevent falls. What can I do on the outside of my home?  Regularly fix the edges of walkways and driveways and fix any cracks.  Remove anything that might make you trip as you walk through a door, such as a raised step or threshold.  Trim any bushes or trees on the path to your home.  Use bright outdoor lighting.  Clear any walking paths of anything that might make someone trip, such as rocks or tools.  Regularly check to see if handrails are loose or broken. Make sure that both sides of any steps have handrails.  Any raised decks and porches should have guardrails on the edges.  Have any leaves, snow, or ice cleared regularly.  Use sand or salt on walking paths during winter.  Clean up any spills in your garage right away. This includes oil or grease spills. What can I do in the bathroom?  Use night lights.  Install grab bars by the toilet and in the tub and shower. Do not use towel bars as grab bars.  Use non-skid mats or decals in the tub or shower.  If you need to sit down in the shower, use a plastic, non-slip stool.  Keep the floor dry. Clean up any water that spills on the floor as soon as it happens.  Remove soap buildup in the tub or shower regularly.  Attach bath mats securely with double-sided non-slip rug tape.  Do not have throw rugs and other things on the floor that can make  you trip. What can I do in the bedroom?  Use night lights.  Make sure that you have a light by your bed that is easy to reach.  Do not use any sheets or blankets that are too big for your bed. They should not hang down onto the floor.  Have a firm chair that has side arms. You can use this for support while you get dressed.  Do not have throw rugs and other things on the floor that can make you trip. What can I do in the kitchen?  Clean up any spills right away.  Avoid walking on wet floors.  Keep items that you use a lot in easy-to-reach places.  If you need to reach something above you, use a strong step stool that has a grab bar.  Keep electrical cords out of the way.  Do not use floor polish or wax that makes floors slippery. If you must use wax, use non-skid floor wax.  Do not have throw rugs and other things on the floor that can make you trip. What can I do with my stairs?  Do not leave any items on the stairs.  Make sure that there are handrails on both sides of the stairs and use them. Fix handrails that are broken or loose. Make sure that handrails are as long as the stairways.  Check any carpeting to make sure that it is firmly attached to the stairs. Fix any carpet that is loose or worn.  Avoid having throw rugs at the top or bottom of the stairs. If you do have throw rugs, attach them to the floor with carpet tape.  Make sure that you have a light switch at the top of the stairs and the bottom of the stairs. If you do not have them, ask someone to add them for you. What else can I do to help prevent falls?  Wear shoes that:  Do not have high heels.  Have rubber bottoms.  Are comfortable and fit you well.  Are closed at the toe. Do not wear sandals.  If you use a stepladder:  Make sure that it is fully opened. Do not climb a closed stepladder.  Make sure that both sides of the stepladder are locked into place.  Ask someone to hold it for you, if  possible.  Clearly mark and make sure that you can see:  Any grab bars or handrails.  First and last steps.  Where the edge of each step is.  Use tools that help you move around (mobility aids) if they are needed. These include:  Canes.  Walkers.  Scooters.  Crutches.  Turn on the lights when you go into a dark area. Replace any light bulbs as soon as they burn out.  Set up your furniture so you have a clear path. Avoid moving your furniture around.  If any of your floors are uneven, fix them.  If there are any pets around you, be aware of where they are.  Review your medicines with your doctor. Some medicines can make you feel dizzy. This can increase your chance of falling. Ask your doctor what other things that you can do to help prevent falls. This information is not intended to replace advice given to you by your health care provider. Make sure you discuss any questions you have with your health care provider. Document Released: 08/21/2009 Document Revised: 04/01/2016 Document Reviewed: 11/29/2014 Elsevier Interactive Patient Education  2017 [employer].

## 2018-05-04 ENCOUNTER — Encounter: Payer: Self-pay | Admitting: Internal Medicine

## 2018-05-04 DIAGNOSIS — F339 Major depressive disorder, recurrent, unspecified: Secondary | ICD-10-CM | POA: Insufficient documentation

## 2018-05-23 ENCOUNTER — Encounter: Payer: Self-pay | Admitting: Internal Medicine

## 2018-05-23 ENCOUNTER — Non-Acute Institutional Stay (SKILLED_NURSING_FACILITY): Payer: Medicare Other | Admitting: Internal Medicine

## 2018-05-23 DIAGNOSIS — D693 Immune thrombocytopenic purpura: Secondary | ICD-10-CM | POA: Diagnosis not present

## 2018-05-23 DIAGNOSIS — R42 Dizziness and giddiness: Secondary | ICD-10-CM

## 2018-05-23 DIAGNOSIS — E538 Deficiency of other specified B group vitamins: Secondary | ICD-10-CM | POA: Diagnosis not present

## 2018-05-23 NOTE — Progress Notes (Signed)
NURSING HOME LOCATION:  [facility] ROOM NUMBER:  [id number]  CODE STATUS:  Full Code  PCP:  Hendricks Limes, MD  [place] [address]  This is a nursing facility follow up of chronic medical diagnoses.    Interim medical record and care since last [place] visit was updated with review of diagnostic studies and change in clinical status since last visit were documented.  HPI:  The patient is a permanent resident of a facility with multi-vessel coronary disease being treated medically because of his advanced comorbidities. Specifically these include advanced age, end-stage COPD, peripheral vascular disease, B12 deficiency related neuropathy and protein/caloric malnutrition. The malnutrition was in the context of drinking 6-7 beers per day. Recently the patient had oral surgery with postoperative complication of anterior neck ecchymoses in the context of thrombocytopenia.His platelet count did drop to 16,000 on 5/22 but was 59,000 on 6/12.  Despite high-dose atorvastatin 80 mg, his LDL is slightly above goal with a value of 75. B12 level has been corrected. Vitamin D level is low at 20.54. His alkaline phosphatase was slightly elevated but AST and ALT are actually low.  Review of systems:Date given as 2019. President given as Milinda Pointer. Patient reports he is "3/4 healed" from his oral surgery. He reports he still has some gum tenderness.   Patient complains of dizziness that he has had over the past 4-5 years, he dentis any chest pain,shortness of breath, nausea or vomiting. He states he is just dizzy all the time.  Symptoms began in 2014 and have progressed. Dr. Ernesto Rutherford, ENT referred him to audiology who recommended referral to the neuro rehabilitation center at [facility] to evaluate vestibular imbalance. This was never completed. He states he has had an MRI; but Epic reveals no MRI since 2008. History has suggested possible postural component. Patient reports he  has ongoing pain in feet up to knees that he states is some type of "diabetic nerve pain"  Constitutional: No fever, significant weight change, fatigue  Eyes: No redness, discharge, pain, vision change ENT/mouth: No nasal congestion,  purulent discharge, earache, change in hearing, sore throat  Cardiovascular: No chest pain, palpitations, paroxysmal nocturnal dyspnea, claudication, edema  Respiratory: No cough, sputum production, hemoptysis, DOE, significant snoring, apnea   Gastrointestinal: No heartburn, dysphagia, abdominal pain, nausea /vomiting, rectal bleeding, melena, change in bowels Genitourinary: No dysuria, hematuria, pyuria, incontinence, nocturia Dermatologic: No rash, pruritus, change in appearance of skin Neurologic: No  headache, syncope, seizures Psychiatric: No significant anxiety, depression, insomnia, anorexia Endocrine: No change in hair/skin/nails, excessive thirst, excessive hunger, excessive urination  Hematologic/lymphatic: No significant lymphadenopathy,no new abnormal bleeding Allergy/immunology: No itchy/watery eyes, significant sneezing, urticaria, angioedema  Physical exam:  Pertinent or positive findings: patient pleasant, jovial, and cooperative.  Sitting in wheelchair, neatly groomed and dressed. 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 He has slight arcus senilis present, periorbital puffiness noted bilaterally. He has upper plate in but no lower plate (endentulous). There is no ecchymosis over the anterior neck. Heart sounds distant. He has arthritic changes to joints of fingers and hands. Arthritic changes involve the MCP, PIP, DIP joints in isolated fashion. Has large tophus of the right elbow. The elbow is dressed. He has decreased range of motion in both arms, cannot raise them higher than chest level, good strength in hands. Asymmetry noted in shoulders with left higher than right.  Pedal pulses diminished. Slight edema noted at sock line.  EOM  intact w/o nystagmus.  General appearance: Adequately nourished; no acute distress, increased work of breathing is present.   Lymphatic: No lymphadenopathy about the head, neck, axilla. Eyes: No conjunctival inflammation or lid edema is present. There is no scleral icterus. Ears:  External ear exam shows no significant lesions or deformities.   Nose:  External nasal examination shows no deformity or inflammation. Nasal mucosa are pink and moist without lesions, exudates Oral exam:  Lips and gums are healthy appearing. There is no oropharyngeal erythema or exudate. Neck:  No thyromegaly, masses, tenderness noted.    Heart:  without gallop, murmur, click, rub .  Lungs: without wheezes, rhonchi, rales, rubs. Abdomen: Bowel sounds are normal. Abdomen is soft and nontender with no organomegaly, hernias, masses. GU: Deferred  Extremities:  No cyanosis, clubbing  Skin: Warm & dry w/o tenting. No significant rash.  See summary under each active problem in the Problem List with associated updated therapeutic plan

## 2018-05-23 NOTE — Assessment & Plan Note (Signed)
No active bleeding dyscrasias Ecchymosis following oral surgery has resolved completely It was stressed that he should have no surgery without assessment of platelet count & bleeding risk

## 2018-05-23 NOTE — Assessment & Plan Note (Addendum)
Refer to [facility] to evaluate & treat  chronic vestibular imbalance issues with subjective constant dizziness

## 2018-05-23 NOTE — Patient Instructions (Signed)
See assessment and plan under each diagnosis in the problem list and acutely for this visit 

## 2018-05-23 NOTE — Assessment & Plan Note (Addendum)
B12 level now therapeutic

## 2018-05-24 ENCOUNTER — Encounter: Payer: Self-pay | Admitting: Internal Medicine

## 2018-06-26 ENCOUNTER — Ambulatory Visit: Payer: Medicare Other | Admitting: Physical Therapy

## 2018-09-28 ENCOUNTER — Inpatient Hospital Stay (HOSPITAL_COMMUNITY)
Admission: EM | Admit: 2018-09-28 | Discharge: 2018-10-03 | DRG: 312 | Disposition: A | Discharge status: Skilled Nursing Facility | Payer: Medicare Other | Attending: Internal Medicine | Admitting: Internal Medicine

## 2018-09-28 ENCOUNTER — Observation Stay (HOSPITAL_COMMUNITY): Payer: Medicare Other

## 2018-09-28 ENCOUNTER — Emergency Department (HOSPITAL_COMMUNITY): Payer: Medicare Other

## 2018-09-28 ENCOUNTER — Encounter (HOSPITAL_COMMUNITY): Payer: Self-pay

## 2018-09-28 ENCOUNTER — Other Ambulatory Visit: Payer: Self-pay

## 2018-09-28 DIAGNOSIS — Z7289 Other problems related to lifestyle: Secondary | ICD-10-CM | POA: Diagnosis not present

## 2018-09-28 DIAGNOSIS — Z820 Family history of epilepsy and other diseases of the nervous system: Secondary | ICD-10-CM

## 2018-09-28 DIAGNOSIS — N179 Acute kidney failure, unspecified: Secondary | ICD-10-CM

## 2018-09-28 DIAGNOSIS — E876 Hypokalemia: Secondary | ICD-10-CM | POA: Diagnosis not present

## 2018-09-28 DIAGNOSIS — G9340 Encephalopathy, unspecified: Secondary | ICD-10-CM

## 2018-09-28 DIAGNOSIS — I951 Orthostatic hypotension: Principal | ICD-10-CM | POA: Diagnosis present

## 2018-09-28 DIAGNOSIS — E43 Unspecified severe protein-calorie malnutrition: Secondary | ICD-10-CM | POA: Diagnosis present

## 2018-09-28 DIAGNOSIS — D696 Thrombocytopenia, unspecified: Secondary | ICD-10-CM | POA: Diagnosis present

## 2018-09-28 DIAGNOSIS — I739 Peripheral vascular disease, unspecified: Secondary | ICD-10-CM | POA: Diagnosis present

## 2018-09-28 DIAGNOSIS — W19XXXA Unspecified fall, initial encounter: Secondary | ICD-10-CM

## 2018-09-28 DIAGNOSIS — Z87891 Personal history of nicotine dependence: Secondary | ICD-10-CM

## 2018-09-28 DIAGNOSIS — I2510 Atherosclerotic heart disease of native coronary artery without angina pectoris: Secondary | ICD-10-CM | POA: Diagnosis present

## 2018-09-28 DIAGNOSIS — Z23 Encounter for immunization: Secondary | ICD-10-CM | POA: Diagnosis not present

## 2018-09-28 DIAGNOSIS — G9341 Metabolic encephalopathy: Secondary | ICD-10-CM | POA: Diagnosis not present

## 2018-09-28 DIAGNOSIS — M25512 Pain in left shoulder: Secondary | ICD-10-CM | POA: Diagnosis present

## 2018-09-28 DIAGNOSIS — I252 Old myocardial infarction: Secondary | ICD-10-CM

## 2018-09-28 DIAGNOSIS — S0181XA Laceration without foreign body of other part of head, initial encounter: Secondary | ICD-10-CM | POA: Diagnosis present

## 2018-09-28 DIAGNOSIS — R443 Hallucinations, unspecified: Secondary | ICD-10-CM | POA: Diagnosis present

## 2018-09-28 DIAGNOSIS — E860 Dehydration: Secondary | ICD-10-CM | POA: Diagnosis not present

## 2018-09-28 DIAGNOSIS — E871 Hypo-osmolality and hyponatremia: Secondary | ICD-10-CM | POA: Diagnosis not present

## 2018-09-28 DIAGNOSIS — S0101XA Laceration without foreign body of scalp, initial encounter: Secondary | ICD-10-CM

## 2018-09-28 DIAGNOSIS — M545 Low back pain: Secondary | ICD-10-CM | POA: Diagnosis not present

## 2018-09-28 DIAGNOSIS — M25551 Pain in right hip: Secondary | ICD-10-CM | POA: Diagnosis present

## 2018-09-28 DIAGNOSIS — I5032 Chronic diastolic (congestive) heart failure: Secondary | ICD-10-CM | POA: Diagnosis not present

## 2018-09-28 DIAGNOSIS — E538 Deficiency of other specified B group vitamins: Secondary | ICD-10-CM | POA: Diagnosis present

## 2018-09-28 DIAGNOSIS — G629 Polyneuropathy, unspecified: Secondary | ICD-10-CM | POA: Diagnosis present

## 2018-09-28 DIAGNOSIS — I110 Hypertensive heart disease with heart failure: Secondary | ICD-10-CM | POA: Diagnosis not present

## 2018-09-28 DIAGNOSIS — W010XXA Fall on same level from slipping, tripping and stumbling without subsequent striking against object, initial encounter: Secondary | ICD-10-CM | POA: Diagnosis present

## 2018-09-28 DIAGNOSIS — F329 Major depressive disorder, single episode, unspecified: Secondary | ICD-10-CM | POA: Diagnosis present

## 2018-09-28 DIAGNOSIS — J449 Chronic obstructive pulmonary disease, unspecified: Secondary | ICD-10-CM | POA: Diagnosis present

## 2018-09-28 DIAGNOSIS — R55 Syncope and collapse: Secondary | ICD-10-CM | POA: Diagnosis present

## 2018-09-28 DIAGNOSIS — Z7982 Long term (current) use of aspirin: Secondary | ICD-10-CM

## 2018-09-28 DIAGNOSIS — I5022 Chronic systolic (congestive) heart failure: Secondary | ICD-10-CM | POA: Diagnosis present

## 2018-09-28 DIAGNOSIS — Z89421 Acquired absence of other right toe(s): Secondary | ICD-10-CM

## 2018-09-28 DIAGNOSIS — Z789 Other specified health status: Secondary | ICD-10-CM

## 2018-09-28 DIAGNOSIS — Z6822 Body mass index (BMI) 22.0-22.9, adult: Secondary | ICD-10-CM

## 2018-09-28 DIAGNOSIS — Z833 Family history of diabetes mellitus: Secondary | ICD-10-CM

## 2018-09-28 LAB — URINALYSIS, ROUTINE W REFLEX MICROSCOPIC
Bilirubin Urine: NEGATIVE
Glucose, UA: NEGATIVE mg/dL
Ketones, ur: NEGATIVE mg/dL
Nitrite: NEGATIVE
Protein, ur: 30 mg/dL — AB
RBC / HPF: >50 RBC/hpf — ABNORMAL HIGH (ref 0–5)
Specific Gravity, Urine: 1.021 (ref 1.005–1.030)
pH: 6 (ref 5.0–8.0)

## 2018-09-28 LAB — CBC WITH DIFFERENTIAL/PLATELET
Abs Immature Granulocytes: 0.06 10*3/uL (ref 0.00–0.07)
Basophils Absolute: 0 10*3/uL (ref 0.0–0.1)
Basophils Relative: 0 %
Eosinophils Absolute: 0 10*3/uL (ref 0.0–0.5)
Eosinophils Relative: 0 %
HCT: 42.4 % (ref 39.0–52.0)
Hemoglobin: 14.2 g/dL (ref 13.0–17.0)
Immature Granulocytes: 1 %
Lymphocytes Relative: 14 %
Lymphs Abs: 1.2 10*3/uL (ref 0.7–4.0)
MCH: 29 pg (ref 26.0–34.0)
MCHC: 33.5 g/dL (ref 30.0–36.0)
MCV: 86.7 fL (ref 80.0–100.0)
Monocytes Absolute: 0.8 10*3/uL (ref 0.1–1.0)
Monocytes Relative: 10 %
Neutro Abs: 6.3 10*3/uL (ref 1.7–7.7)
Neutrophils Relative %: 75 %
Platelets: 48 10*3/uL — ABNORMAL LOW (ref 150–400)
RBC: 4.89 MIL/uL (ref 4.22–5.81)
RDW: 16.5 % — ABNORMAL HIGH (ref 11.5–15.5)
WBC: 8.4 10*3/uL (ref 4.0–10.5)
nRBC: 0 % (ref 0.0–0.2)

## 2018-09-28 LAB — COMPREHENSIVE METABOLIC PANEL
ALT: 51 U/L — ABNORMAL HIGH (ref 0–44)
AST: 42 U/L — ABNORMAL HIGH (ref 15–41)
Albumin: 3.7 g/dL (ref 3.5–5.0)
Alkaline Phosphatase: 135 U/L — ABNORMAL HIGH (ref 38–126)
Anion gap: 11 (ref 5–15)
BUN: 14 mg/dL (ref 8–23)
CO2: 24 mmol/L (ref 22–32)
Calcium: 9.3 mg/dL (ref 8.9–10.3)
Chloride: 97 mmol/L — ABNORMAL LOW (ref 98–111)
Creatinine, Ser: 1.24 mg/dL (ref 0.61–1.24)
GFR calc Af Amer: >60 mL/min (ref >60)
GFR calc non Af Amer: 54 mL/min — ABNORMAL LOW (ref >60)
Glucose, Bld: 132 mg/dL — ABNORMAL HIGH (ref 70–99)
Potassium: 3.7 mmol/L (ref 3.5–5.1)
Sodium: 132 mmol/L — ABNORMAL LOW (ref 135–145)
Total Bilirubin: 1 mg/dL (ref 0.3–1.2)
Total Protein: 6.9 g/dL (ref 6.5–8.1)

## 2018-09-28 LAB — I-STAT TROPONIN, ED: Troponin i, poc: 0.01 ng/mL (ref 0.00–0.08)

## 2018-09-28 LAB — CK: Total CK: 82 U/L (ref 49–397)

## 2018-09-28 LAB — TROPONIN I: Troponin I: <0.03 ng/mL (ref <0.03)

## 2018-09-28 LAB — AMMONIA: Ammonia: <9 umol/L — ABNORMAL LOW (ref 9–35)

## 2018-09-28 MED ORDER — LORAZEPAM 2 MG/ML IJ SOLN: 1.0000 mg | Freq: Four times a day (QID) | INTRAMUSCULAR | Status: AC | PRN

## 2018-09-28 MED ORDER — VITAMIN B1 100 MG PO TABS
100.0000 mg | ORAL_TABLET | Freq: Every day | ORAL | Status: DC
  Filled 2018-09-28: qty 1

## 2018-09-28 MED ORDER — ASPIRIN 81 MG PO CHEW
81.0000 mg | CHEWABLE_TABLET | Freq: Every day | ORAL | Status: DC
  Administered 2018-09-28 – 2018-10-03 (×6): 81 mg via ORAL
  Filled 2018-09-28 (×6): qty 1

## 2018-09-28 MED ORDER — IPRATROPIUM-ALBUTEROL 0.5-2.5 (3) MG/3ML IN SOLN: 3.0000 mL | Freq: Four times a day (QID) | RESPIRATORY_TRACT | Status: DC | PRN

## 2018-09-28 MED ORDER — CLOPIDOGREL BISULFATE 75 MG PO TABS
75.0000 mg | ORAL_TABLET | Freq: Every day | ORAL | Status: DC
  Administered 2018-09-29 – 2018-10-03 (×5): 75 mg via ORAL
  Filled 2018-09-28 (×5): qty 1

## 2018-09-28 MED ORDER — CARVEDILOL 6.25 MG PO TABS
6.2500 mg | ORAL_TABLET | Freq: Two times a day (BID) | ORAL | Status: DC
  Administered 2018-09-29 – 2018-10-01 (×4): 6.25 mg via ORAL
  Filled 2018-09-28 (×4): qty 1

## 2018-09-28 MED ORDER — SODIUM CHLORIDE 0.9 % IV SOLN
INTRAVENOUS | Status: AC
  Administered 2018-09-29 (×2): via INTRAVENOUS

## 2018-09-28 MED ORDER — ADULT MULTIVITAMIN W/MINERALS CH
1.0000 | ORAL_TABLET | Freq: Every day | ORAL | Status: DC
  Administered 2018-09-28 – 2018-10-03 (×6): 1 via ORAL
  Filled 2018-09-28 (×6): qty 1

## 2018-09-28 MED ORDER — VITAMIN B-12 1000 MCG PO TABS
1000.0000 ug | ORAL_TABLET | Freq: Every day | ORAL | Status: DC
  Administered 2018-09-28 – 2018-10-03 (×6): 1000 ug via ORAL
  Filled 2018-09-28 (×6): qty 1

## 2018-09-28 MED ORDER — LORAZEPAM 1 MG PO TABS: 1.0000 mg | ORAL_TABLET | Freq: Four times a day (QID) | ORAL | Status: AC | PRN

## 2018-09-28 MED ORDER — FOLIC ACID 1 MG PO TABS
1.0000 mg | ORAL_TABLET | Freq: Every day | ORAL | Status: DC
  Administered 2018-09-28 – 2018-10-03 (×6): 1 mg via ORAL
  Filled 2018-09-28 (×6): qty 1

## 2018-09-28 MED ORDER — LIDOCAINE HCL (PF) 1 % IJ SOLN
5.0000 mL | Freq: Once | INTRAMUSCULAR | Status: DC
  Filled 2018-09-28: qty 5

## 2018-09-28 MED ORDER — SODIUM CHLORIDE 0.9 % IV BOLUS
500.0000 mL | Freq: Once | INTRAVENOUS | Status: AC
  Administered 2018-09-28: 500 mL via INTRAVENOUS

## 2018-09-28 MED ORDER — ISOSORBIDE MONONITRATE ER 30 MG PO TB24
30.0000 mg | ORAL_TABLET | ORAL | Status: DC
  Administered 2018-09-28 – 2018-09-30 (×3): 30 mg via ORAL
  Filled 2018-09-28 (×3): qty 1

## 2018-09-28 MED ORDER — NITROGLYCERIN 0.4 MG SL SUBL: 0.4000 mg | SUBLINGUAL_TABLET | SUBLINGUAL | Status: DC | PRN

## 2018-09-28 MED ORDER — VENLAFAXINE HCL ER 75 MG PO CP24
75.0000 mg | ORAL_CAPSULE | Freq: Every day | ORAL | Status: DC
  Administered 2018-09-29 – 2018-10-03 (×5): 75 mg via ORAL
  Filled 2018-09-28 (×5): qty 1

## 2018-09-28 MED ORDER — TETANUS-DIPHTH-ACELL PERTUSSIS 5-2.5-18.5 LF-MCG/0.5 IM SUSP
0.5000 mL | Freq: Once | INTRAMUSCULAR | Status: AC
  Administered 2018-09-28: 0.5 mL via INTRAMUSCULAR
  Filled 2018-09-28: qty 0.5

## 2018-09-28 MED ORDER — ATORVASTATIN CALCIUM 80 MG PO TABS
80.0000 mg | ORAL_TABLET | Freq: Every day | ORAL | Status: DC
  Administered 2018-09-29 – 2018-10-03 (×5): 80 mg via ORAL
  Filled 2018-09-28 (×6): qty 1

## 2018-09-28 MED ORDER — THIAMINE HCL 100 MG/ML IJ SOLN
100.0000 mg | Freq: Every day | INTRAMUSCULAR | Status: DC
  Filled 2018-09-28: qty 2

## 2018-09-28 MED ORDER — GABAPENTIN 100 MG PO CAPS
200.0000 mg | ORAL_CAPSULE | Freq: Three times a day (TID) | ORAL | Status: DC
  Administered 2018-09-28 – 2018-10-03 (×15): 200 mg via ORAL
  Filled 2018-09-28 (×15): qty 2

## 2018-09-28 MED ORDER — VITAMIN D 25 MCG (1000 UNIT) PO TABS
1500.0000 [IU] | ORAL_TABLET | Freq: Every day | ORAL | Status: DC
  Administered 2018-09-29 – 2018-10-03 (×5): 1500 [IU] via ORAL
  Filled 2018-09-28 (×6): qty 2

## 2018-09-28 MED ORDER — SENNOSIDES-DOCUSATE SODIUM 8.6-50 MG PO TABS
2.0000 | ORAL_TABLET | Freq: Every day | ORAL | Status: DC
  Administered 2018-09-28 – 2018-10-02 (×5): 2 via ORAL
  Filled 2018-09-28 (×5): qty 2

## 2018-09-28 MED ORDER — SODIUM CHLORIDE 0.9% FLUSH
3.0000 mL | Freq: Two times a day (BID) | INTRAVENOUS | Status: DC
  Administered 2018-09-28 – 2018-10-03 (×8): 3 mL via INTRAVENOUS

## 2018-09-28 MED ORDER — PANTOPRAZOLE SODIUM 40 MG PO TBEC
40.0000 mg | DELAYED_RELEASE_TABLET | Freq: Every day | ORAL | Status: DC
  Administered 2018-09-28 – 2018-10-03 (×6): 40 mg via ORAL
  Filled 2018-09-28 (×6): qty 1

## 2018-09-28 NOTE — ED Notes (Signed)
Pt aware of need for urine sample.  

## 2018-09-28 NOTE — ED Notes (Signed)
Attempted straight cath-severely enlarged prostate, unable to pass catheter. [place] Law aware
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
## 2018-09-28 NOTE — Discharge Instructions (Signed)
If your suture has not fallen out or does not pull out with a gentle tug in 1 week, please return to the emergency department or see your PCP for suture removal.  If you develop any increasing pain, redness, swelling, drainage, red streaking from the wound, please return sooner.

## 2018-09-28 NOTE — ED Provider Notes (Signed)
Patient fell last night.  Striking his forehead.  He states he has had an unsteady gait for the past 5 or 6 years.  He typically walks with a walker however he currently does not have a walker.  He is currently staying in a motel, and is homeless.  On exam he is alert Glasgow Coma Score 15, chronically ill-appearing, cachectic HEENT exam there is a 1 cm laceration at the forehead near the hairline with small surrounding hematoma.  Otherwise normocephalic atraumatic neck is supple lungs clear to auscultation heart regular rate and rhythm abdomen nondistended nontender pelvis stable nontender on internal and external rotation of bilateral thighs all 4 extremities or contusion abrasion or tenderness neurovascularly intact.  Entire spine is nontender.  Logic moves all extremities cranial nerves II through XII intact Glasgow Coma Score 15 alert and oriented x3   Vincent Dakin, MD 09/28/18 1553
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
## 2018-09-29 ENCOUNTER — Observation Stay (HOSPITAL_COMMUNITY): Payer: Medicare Other

## 2018-09-29 ENCOUNTER — Observation Stay (HOSPITAL_BASED_OUTPATIENT_CLINIC_OR_DEPARTMENT_OTHER): Payer: Medicare Other

## 2018-09-29 DIAGNOSIS — W19XXXA Unspecified fall, initial encounter: Secondary | ICD-10-CM

## 2018-09-29 DIAGNOSIS — E860 Dehydration: Secondary | ICD-10-CM | POA: Diagnosis present

## 2018-09-29 DIAGNOSIS — R55 Syncope and collapse: Secondary | ICD-10-CM

## 2018-09-29 DIAGNOSIS — D696 Thrombocytopenia, unspecified: Secondary | ICD-10-CM

## 2018-09-29 DIAGNOSIS — E876 Hypokalemia: Secondary | ICD-10-CM | POA: Diagnosis not present

## 2018-09-29 DIAGNOSIS — N179 Acute kidney failure, unspecified: Secondary | ICD-10-CM | POA: Diagnosis present

## 2018-09-29 DIAGNOSIS — Z7289 Other problems related to lifestyle: Secondary | ICD-10-CM | POA: Diagnosis not present

## 2018-09-29 DIAGNOSIS — M25512 Pain in left shoulder: Secondary | ICD-10-CM | POA: Diagnosis present

## 2018-09-29 DIAGNOSIS — G9340 Encephalopathy, unspecified: Secondary | ICD-10-CM | POA: Diagnosis present

## 2018-09-29 DIAGNOSIS — S0181XA Laceration without foreign body of other part of head, initial encounter: Secondary | ICD-10-CM | POA: Diagnosis present

## 2018-09-29 DIAGNOSIS — E538 Deficiency of other specified B group vitamins: Secondary | ICD-10-CM | POA: Diagnosis present

## 2018-09-29 DIAGNOSIS — I2510 Atherosclerotic heart disease of native coronary artery without angina pectoris: Secondary | ICD-10-CM | POA: Diagnosis present

## 2018-09-29 DIAGNOSIS — R443 Hallucinations, unspecified: Secondary | ICD-10-CM | POA: Diagnosis present

## 2018-09-29 DIAGNOSIS — M25551 Pain in right hip: Secondary | ICD-10-CM | POA: Diagnosis present

## 2018-09-29 DIAGNOSIS — G629 Polyneuropathy, unspecified: Secondary | ICD-10-CM | POA: Diagnosis present

## 2018-09-29 DIAGNOSIS — I110 Hypertensive heart disease with heart failure: Secondary | ICD-10-CM | POA: Diagnosis present

## 2018-09-29 DIAGNOSIS — I951 Orthostatic hypotension: Secondary | ICD-10-CM | POA: Diagnosis present

## 2018-09-29 DIAGNOSIS — G9341 Metabolic encephalopathy: Secondary | ICD-10-CM | POA: Diagnosis present

## 2018-09-29 DIAGNOSIS — E871 Hypo-osmolality and hyponatremia: Secondary | ICD-10-CM | POA: Diagnosis present

## 2018-09-29 DIAGNOSIS — E43 Unspecified severe protein-calorie malnutrition: Secondary | ICD-10-CM | POA: Diagnosis present

## 2018-09-29 DIAGNOSIS — I739 Peripheral vascular disease, unspecified: Secondary | ICD-10-CM | POA: Diagnosis present

## 2018-09-29 DIAGNOSIS — Z23 Encounter for immunization: Secondary | ICD-10-CM | POA: Diagnosis present

## 2018-09-29 DIAGNOSIS — W010XXA Fall on same level from slipping, tripping and stumbling without subsequent striking against object, initial encounter: Secondary | ICD-10-CM | POA: Diagnosis not present

## 2018-09-29 DIAGNOSIS — I5032 Chronic diastolic (congestive) heart failure: Secondary | ICD-10-CM | POA: Diagnosis present

## 2018-09-29 DIAGNOSIS — I252 Old myocardial infarction: Secondary | ICD-10-CM | POA: Diagnosis not present

## 2018-09-29 DIAGNOSIS — J449 Chronic obstructive pulmonary disease, unspecified: Secondary | ICD-10-CM | POA: Diagnosis present

## 2018-09-29 DIAGNOSIS — F329 Major depressive disorder, single episode, unspecified: Secondary | ICD-10-CM | POA: Diagnosis present

## 2018-09-29 DIAGNOSIS — M545 Low back pain: Secondary | ICD-10-CM | POA: Diagnosis present

## 2018-09-29 DIAGNOSIS — Z6822 Body mass index (BMI) 22.0-22.9, adult: Secondary | ICD-10-CM | POA: Diagnosis not present

## 2018-09-29 LAB — VITAMIN B12: VITAMIN B 12: 1005 pg/mL — AB (ref 180–914)

## 2018-09-29 LAB — BASIC METABOLIC PANEL
ANION GAP: 9 (ref 5–15)
BUN: 13 mg/dL (ref 8–23)
CHLORIDE: 99 mmol/L (ref 98–111)
CO2: 22 mmol/L (ref 22–32)
Calcium: 8.4 mg/dL — ABNORMAL LOW (ref 8.9–10.3)
Creatinine, Ser: 0.95 mg/dL (ref 0.61–1.24)
GFR calc Af Amer: >60 mL/min (ref >60)
GFR calc non Af Amer: >60 mL/min (ref >60)
GLUCOSE: 152 mg/dL — AB (ref 70–99)
POTASSIUM: 3.4 mmol/L — AB (ref 3.5–5.1)
Sodium: 130 mmol/L — ABNORMAL LOW (ref 135–145)

## 2018-09-29 LAB — CBC
HCT: 35.4 % — ABNORMAL LOW (ref 39.0–52.0)
HEMOGLOBIN: 11.6 g/dL — AB (ref 13.0–17.0)
MCH: 28.6 pg (ref 26.0–34.0)
MCHC: 32.8 g/dL (ref 30.0–36.0)
MCV: 87.4 fL (ref 80.0–100.0)
NRBC: 0 % (ref 0.0–0.2)
Platelets: 50 10*3/uL — ABNORMAL LOW (ref 150–400)
RBC: 4.05 MIL/uL — AB (ref 4.22–5.81)
RDW: 16.6 % — ABNORMAL HIGH (ref 11.5–15.5)
WBC: 6.1 10*3/uL (ref 4.0–10.5)

## 2018-09-29 LAB — ECHOCARDIOGRAM COMPLETE
HEIGHTINCHES: 71.5 in
WEIGHTICAEL: 2504.4256 [oz_av]

## 2018-09-29 LAB — RAPID URINE DRUG SCREEN, HOSP PERFORMED
AMPHETAMINES: NOT DETECTED
BENZODIAZEPINES: NOT DETECTED
Barbiturates: NOT DETECTED
Cocaine: NOT DETECTED
OPIATES: NOT DETECTED
TETRAHYDROCANNABINOL: NOT DETECTED

## 2018-09-29 LAB — CORTISOL-AM, BLOOD: Cortisol - AM: 9.5 ug/dL (ref 6.7–22.6)

## 2018-09-29 LAB — TROPONIN I: Troponin I: <0.03 ng/mL (ref <0.03)

## 2018-09-29 LAB — GLUCOSE, CAPILLARY
Glucose-Capillary: 111 mg/dL — ABNORMAL HIGH (ref 70–99)
Glucose-Capillary: 111 mg/dL — ABNORMAL HIGH (ref 70–99)

## 2018-09-29 MED ORDER — SODIUM CHLORIDE 0.9 % IV BOLUS
500.0000 mL | Freq: Once | INTRAVENOUS | Status: AC
  Administered 2018-09-29: 500 mL via INTRAVENOUS

## 2018-09-29 MED ORDER — OXYCODONE HCL 5 MG PO TABS
5.0000 mg | ORAL_TABLET | Freq: Once | ORAL | Status: AC
  Administered 2018-09-29: 5 mg via ORAL
  Filled 2018-09-29: qty 1

## 2018-09-29 MED ORDER — TRAMADOL HCL 50 MG PO TABS
50.0000 mg | ORAL_TABLET | Freq: Four times a day (QID) | ORAL | Status: DC | PRN
  Administered 2018-09-30 – 2018-10-03 (×5): 50 mg via ORAL
  Filled 2018-09-29 (×5): qty 1

## 2018-09-29 MED ORDER — POTASSIUM CHLORIDE CRYS ER 20 MEQ PO TBCR
40.0000 meq | EXTENDED_RELEASE_TABLET | Freq: Once | ORAL | Status: AC
  Administered 2018-09-29: 40 meq via ORAL
  Filled 2018-09-29: qty 2

## 2018-09-29 MED ORDER — PERFLUTREN LIPID MICROSPHERE
1.0000 mL | INTRAVENOUS | Status: AC | PRN
  Administered 2018-09-29: 2 mL via INTRAVENOUS
  Filled 2018-09-29: qty 10

## 2018-09-29 MED ORDER — VITAMIN B-1 100 MG PO TABS
100.0000 mg | ORAL_TABLET | Freq: Every day | ORAL | Status: DC
  Administered 2018-09-30 – 2018-10-03 (×4): 100 mg via ORAL
  Filled 2018-09-29 (×4): qty 1

## 2018-09-29 NOTE — Evaluation (Signed)
Physical Therapy Evaluation Patient Details Name: Vincent Dunlap MRN: [id number] DOB: 1940/12/11 Today's Date: 09/29/2018   History of Present Illness  Pt is a 77 y.o. male admitted for syncope. PMH positive for HTN, CHF, CAD, COPD, PVD, neuropathy.   Clinical Impression  Pt admitted with above diagnosis. Pt currently limited by cognitive impairments, generalized weakness and orthostatic hypotension. These impairments are limiting his ability to perform bed mobility, transfers and amb with RW safely and independently. Orthostatics positive, see flow sheet for values. Pt reported dizziness throughout session that he stated was continuous but worse in standing, appeared to be related to orthostatic hypotension. Pt educated on safe mobility. A&O x4. However, impairments in higher level cognition evidenced by pt's difficulty answering questions and being internally distracted throughout session. Pt will benefit from skilled PT to increase his independence and safety with mobility to allow discharge to SNF. PT will follow.      Follow Up Recommendations SNF    Equipment Recommendations  Rolling walker with 5" wheels    Recommendations for Other Services       Precautions / Restrictions Precautions Precautions: Fall Restrictions Weight Bearing Restrictions: No      Mobility  Bed Mobility Overal bed mobility: Needs Assistance Bed Mobility: Supine to Sit     Supine to sit: Min assist     General bed mobility comments: Inability to reach for bedrail despite VC and tactile cues, hand held assist to EOB. Pt attempted to rock back and forth to get to sitting.   Transfers Overall transfer level: Needs assistance Equipment used: Rolling walker (2 wheeled) Transfers: Sit to/from Stand Sit to Stand: Mod assist         General transfer comment: 102/67 after sitting back down on bed after stand. Pt returned to supine, vitals stable at end of session  Ambulation/Gait                 Stairs            Wheelchair Mobility    Modified Rankin (Stroke Patients Only)       Balance Overall balance assessment: Needs assistance Sitting-balance support: Bilateral upper extremity supported;Single extremity supported;Feet supported Sitting balance-Leahy Scale: Poor Sitting balance - Comments: had difficulty relaxing left UE for BP reading, gave min assist support for sitting then pt able to hold himself up with 1 UE   Standing balance support: Bilateral upper extremity supported;Single extremity supported Standing balance-Leahy Scale: Poor Standing balance comment: RW for support in standing, PT provided min assit for pt to relax L UE for BP reading, after a minute of standing pt able to stand statically with just 1 UE support and min guard.                              Pertinent Vitals/Pain Pain Assessment: No/denies pain    Home Living Family/patient expects to be discharged to:: Unsure                      Prior Function Level of Independence: Needs assistance   Gait / Transfers Assistance Needed: used w/c and RW     Comments: Difficult to get information due to patients cognitive impairments and my understanding of him     Hand Dominance        Extremity/Trunk Assessment   Upper Extremity Assessment Upper Extremity Assessment: Generalized weakness    Lower Extremity Assessment Lower Extremity Assessment:  Generalized weakness       Communication   Communication: HOH;Expressive difficulties  Cognition Arousal/Alertness: Awake/alert Behavior During Therapy: WFL for tasks assessed/performed Overall Cognitive Status: Impaired/Different from baseline Area of Impairment: Memory;Problem solving;Safety/judgement;Attention(higher level processing)                   Current Attention Level: Divided;Selective Memory: Decreased short-term memory   Safety/Judgement: Decreased awareness of deficits Awareness:  Intellectual Problem Solving: Requires verbal cues;Requires tactile cues General Comments: A&Ox4, pt has difficulty following commands but unsure how much is related to [facility] vs cognitive deficit. Pt appears distracted by internal thoughts. When pt stated that he wanted to discharge to different location from family's desires, PT asked where he or family wanted him to go and he could not answer.      General Comments General comments (skin integrity, edema, etc.): Pt saw SCDs at end of session and did not know how they got on his legs or what they were.     Exercises     Assessment/Plan    PT Assessment Patient needs continued PT services  PT Problem List Decreased coordination;Decreased activity tolerance;Decreased cognition;Cardiopulmonary status limiting activity;Decreased balance;Decreased knowledge of use of DME;Decreased safety awareness;Decreased mobility;Decreased strength       PT Treatment Interventions Therapeutic activities;DME instruction;Cognitive remediation;Gait training;Therapeutic exercise;Patient/family education;Stair training;Balance training;Functional mobility training;Neuromuscular re-education;Wheelchair mobility training    PT Goals (Current goals can be found in the Care Plan section)  Acute Rehab PT Goals Patient Stated Goal: be less dizzy PT Goal Formulation: With patient Time For Goal Achievement: 10/13/18    Frequency Min 3X/week   Barriers to discharge Other (comment);Decreased caregiver support no known home, pt was previously living in [employer]    Co-evaluation               AM-PAC PT "6 Clicks" Daily Activity  Outcome Measure Difficulty turning over in bed (including adjusting bedclothes, sheets and blankets)?: Unable Difficulty moving from lying on back to sitting on the side of the bed? : Unable Difficulty sitting down on and standing up from a chair with arms (e.g., wheelchair, bedside commode, etc,.)?: Unable Help needed moving to and  from a bed to chair (including a wheelchair)?: A Lot Help needed walking in hospital room?: A Lot Help needed climbing 3-5 steps with a railing? : A Lot 6 Click Score: 9    End of Session Equipment Utilized During Treatment: Gait belt;Other (comment)(dinamap) Activity Tolerance: Patient tolerated treatment well Patient left: in bed;with bed alarm set;with call bell/phone within reach;with nursing/sitter in room Nurse Communication: Mobility status PT Visit Diagnosis: Dizziness and giddiness (R42);Muscle weakness (generalized) (M62.81);Unsteadiness on feet (R26.81)    Time: 0973-5329 PT Time Calculation (min) (ACUTE ONLY): 35 min   Charges:   PT Evaluation $PT Eval Moderate Complexity: 1 Mod PT Treatments $Therapeutic Activity: 8-22 mins        Gilda Crease, SPT Acute Rehab Services [phone]  Gilda Crease 09/29/2018, 2:58 PM

## 2018-09-29 NOTE — Progress Notes (Signed)
Progress Note    ANDRIK SANDT  OYD:[id number] DOB: [birth year]  DOA: 09/28/2018 PCP: Hendricks Limes, MD    Brief Narrative:    Medical records reviewed and are as summarized below:  UTAH DELAUDER is an 77 y.o. male who presented to the ER after being found on the floor at his motel.  Patient reports that he has chronic dizziness for last 3 years with any kind of movement. Last night when he was trying to get up from the bed to go to the bathroom (has to rock back and forth to get out of bed).  He then tipped over forward to the floor.  He was on the floor all night and was found by the housekeeping this morning who activated EMS.   Assessment/Plan:   Principal Problem:   Syncope Active Problems:   Thrombocytopenia (HCC)   CAD (coronary artery disease), native coronary artery   Chronic systolic CHF (congestive heart failure) (HCC)   COPD GOLD II   Severe protein-calorie malnutrition (HCC)   B12 deficiency   Fall   Acute kidney injury ([place])   Alcohol use  Syncope/mechanical fall-- orthostatic hypotension -patient's story is inconsistant  -orthostatic vitals positive (IVF/TED hose) -Patient also appears to be somewhat dry, has not been eating well in the last few weeks -Hold losartan, furosemide, Aldactone -vestibular PT -recheck orthos after IVF -carotid U/S -echo: EF 45-50% with similar to prior wall motion abnormalities  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 Due to beer potomania -trend    COPD GOLD II  -Currently no wheezing, continue duo nebs as needed    Severe protein-calorie malnutrition ([place]) -Placed on nutritional supplements,  dietitian consult    B12 deficiency -Continue B12 supplementation    Fall -PT OT evaluation, currently lives in a motel, may need rehab (has been to [place] in the past) -need for vestibular evaluation  Dehydration -recheck labs after IVF    Alcohol use -replace vitamins -CIWA   Family Communication/Anticipated D/C date and plan/Code Status   DVT prophylaxis: scd Code Status: Full Code.  Family Communication: none at bedside Disposition Plan: pending PT eval   Medical Consultants:    None.    Subjective:   Has had dizziness for years Drink at least 1 6 pack/day and may eat 2x/day  Objective:    Vitals:   09/28/18 1953 09/28/18 2346 09/29/18 0500 09/29/18 0532  BP: 135/76 96/66  110/64  Pulse: 84 94    Resp: 18 16  17   Temp: (!) 97.4 F (36.3 C) (!) 97.3 F (36.3 C)  97.6 F (36.4 C)  TempSrc: Oral Oral  Oral  SpO2: 99% 94%  94%  Weight:   71 kg   Height:        Intake/Output Summary (Last 24 hours) at 09/29/2018 1444 Last data filed at 09/29/2018 1055 Gross per 24 hour  Intake 1565.61 ml  Output -  Net 1565.61 ml   Filed Weights   09/28/18 1242 09/29/18 0500  Weight: 88.5 kg 71 kg    Exam: In bed, hard of hearing but can read lips rrr +BS, soft, NT Diminished but no wheezing, no increased work of breathing  Data Reviewed:  I have personally reviewed following labs and imaging studies:  Labs: Labs show the following:   Basic Metabolic Panel: Recent Labs  Lab 09/28/18 1257 09/29/18 0941  NA 132* 130*  K 3.7 3.4*  CL 97* 99  CO2 24 22  GLUCOSE 132* 152*  BUN 14 13  CREATININE 1.24 0.95  CALCIUM 9.3 8.4*   GFR Estimated Creatinine Clearance: 65.4 mL/min (by C-G formula based on SCr of 0.95 mg/dL). Liver Function Tests: Recent Labs  Lab 09/28/18 1257  AST 42*  ALT 51*  ALKPHOS 135*  BILITOT 1.0  PROT 6.9  ALBUMIN 3.7   No results for input(s): LIPASE, AMYLASE in the last 168 hours. Recent Labs  Lab  09/28/18 1751  AMMONIA <9*   Coagulation profile No results for input(s): INR, PROTIME in the last 168 hours.  CBC: Recent Labs  Lab 09/28/18 1257 09/29/18 0941  WBC 8.4 6.1  NEUTROABS 6.3  --   HGB 14.2 11.6*  HCT 42.4 35.4*  MCV 86.7 87.4  PLT 48* 50*   Cardiac Enzymes: Recent Labs  Lab 09/28/18 1257 09/28/18 2118 09/29/18 0220 09/29/18 0941  CKTOTAL 82  --   --   --   TROPONINI  --  <0.03 <0.03 <0.03   BNP (last 3 results) No results for input(s): PROBNP in the last 8760 hours. CBG: Recent Labs  Lab 09/29/18 0557 09/29/18 1200  GLUCAP 111* 111*   D-Dimer: No results for input(s): DDIMER in the last 72 hours. Hgb A1c: No results for input(s): HGBA1C in the last 72 hours. Lipid Profile: No results for input(s): CHOL, HDL, LDLCALC, TRIG, CHOLHDL, LDLDIRECT in the last 72 hours. Thyroid function studies: No results for input(s): TSH, T4TOTAL, T3FREE, THYROIDAB in the last 72 hours.  Invalid input(s): FREET3 Anemia work up: Recent Labs    09/28/18 2118  VITAMINB12 1,005*   Sepsis Labs: Recent Labs  Lab 09/28/18 1257 09/29/18 0941  WBC 8.4 6.1    Microbiology No results found for this or any previous visit (from the past 240 hour(s)).  Procedures and diagnostic studies:  Dg Lumbar Spine Complete  Result Date: 09/28/2018 CLINICAL DATA:  [place] last night.  Back pain. EXAM: LUMBAR SPINE - COMPLETE 4+ VIEW COMPARISON:  10/10/2015 FINDINGS: Normal alignment. Chronic degenerative disc disease and degenerative facet disease. No evidence of fracture. No acute finding by radiography. IMPRESSION: Chronic degenerative changes.  No acute or traumatic finding. Electronically Signed   By: Nelson Chimes M.D.   On: 09/28/2018 13:47   Ct Head Wo Contrast  Result Date: 09/28/2018 CLINICAL DATA:  [place].  Hit head. EXAM: CT HEAD WITHOUT CONTRAST CT CERVICAL SPINE WITHOUT CONTRAST TECHNIQUE: Multidetector CT imaging of the head and cervical spine was performed  following the standard protocol without intravenous contrast. Multiplanar CT image reconstructions of the cervical spine were also generated. COMPARISON:  CT scan 07/29/2009 FINDINGS: CT HEAD FINDINGS Brain: Progressive, age related, cerebral atrophy, ventriculomegaly and periventricular white matter disease. No extra-axial fluid collections are identified. No CT findings for acute hemispheric infarction or intracranial hemorrhage. No mass lesions. The brainstem and cerebellum are normal. Vascular: Atherosclerotic calcifications but no definite aneurysm or hyperdense vessels. Skull: No skull fracture or bone lesion. Remote nasal bone fractures are noted. Sinuses/Orbits: The paranasal sinuses and mastoid air cells are clear. The globes are intact. Other: Right frontal scalp hematoma and laceration but no radiopaque foreign body or underlying skull fracture. CT CERVICAL SPINE FINDINGS Alignment: Normal overall alignment. Moderate to advanced degenerative cervical spondylosis  with multilevel disc disease and facet disease. Minimal multilevel degenerative subluxations. Skull base and vertebrae: The skull base C1 and C1-2 articulations are maintained. Moderate C1-2 degenerative changes with mild pannus formation. No acute cervical spine fracture. The facets are normally aligned. Soft tissues and spinal canal: No prevertebral fluid or swelling. No visible canal hematoma. Disc levels: No acute large disc protrusions. There is multilevel spinal and foraminal stenosis due to facet disease and uncinate spurring. Upper chest: Emphysematous changes but no apical lung lesions. Other: Advanced atherosclerotic calcifications involving the carotid arteries. No neck mass or adenopathy. IMPRESSION: 1. Progressive age related cerebral atrophy, ventriculomegaly and periventricular white matter disease. 2. No acute intracranial findings or skull fracture. 3. Right frontal scalp hematoma near the vertex along with a laceration but no  foreign body or underlying fracture. 4. Advanced degenerative cervical spondylosis with multilevel disc disease and facet disease but no acute cervical spine fracture. Electronically Signed   By: Marijo Sanes M.D.   On: 09/28/2018 14:28   Ct Cervical Spine Wo Contrast  Result Date: 09/28/2018 CLINICAL DATA:  [place].  Hit head. EXAM: CT HEAD WITHOUT CONTRAST CT CERVICAL SPINE WITHOUT CONTRAST TECHNIQUE: Multidetector CT imaging of the head and cervical spine was performed following the standard protocol without intravenous contrast. Multiplanar CT image reconstructions of the cervical spine were also generated. COMPARISON:  CT scan 07/29/2009 FINDINGS: CT HEAD FINDINGS Brain: Progressive, age related, cerebral atrophy, ventriculomegaly and periventricular white matter disease. No extra-axial fluid collections are identified. No CT findings for acute hemispheric infarction or intracranial hemorrhage. No mass lesions. The brainstem and cerebellum are normal. Vascular: Atherosclerotic calcifications but no definite aneurysm or hyperdense vessels. Skull: No skull fracture or bone lesion. Remote nasal bone fractures are noted. Sinuses/Orbits: The paranasal sinuses and mastoid air cells are clear. The globes are intact. Other: Right frontal scalp hematoma and laceration but no radiopaque foreign body or underlying skull fracture. CT CERVICAL SPINE FINDINGS Alignment: Normal overall alignment. Moderate to advanced degenerative cervical spondylosis with multilevel disc disease and facet disease. Minimal multilevel degenerative subluxations. Skull base and vertebrae: The skull base C1 and C1-2 articulations are maintained. Moderate C1-2 degenerative changes with mild pannus formation. No acute cervical spine fracture. The facets are normally aligned. Soft tissues and spinal canal: No prevertebral fluid or swelling. No visible canal hematoma. Disc levels: No acute large disc protrusions. There is multilevel spinal and  foraminal stenosis due to facet disease and uncinate spurring. Upper chest: Emphysematous changes but no apical lung lesions. Other: Advanced atherosclerotic calcifications involving the carotid arteries. No neck mass or adenopathy. IMPRESSION: 1. Progressive age related cerebral atrophy, ventriculomegaly and periventricular white matter disease. 2. No acute intracranial findings or skull fracture. 3. Right frontal scalp hematoma near the vertex along with a laceration but no foreign body or underlying fracture. 4. Advanced degenerative cervical spondylosis with multilevel disc disease and facet disease but no acute cervical spine fracture. Electronically Signed   By: Marijo Sanes M.D.   On: 09/28/2018 14:28   Ct Pelvis Wo Contrast  Result Date: 09/28/2018 CLINICAL DATA:  Right hip and groin pain after a fall. EXAM: CT PELVIS WITHOUT CONTRAST TECHNIQUE: Multidetector CT imaging of the pelvis was performed following the standard protocol without intravenous contrast. COMPARISON:  Radiographs 09/28/2018 FINDINGS: Urinary Tract: Diffuse bladder wall thickening with cellule formation and trabeculation likely representing outlet obstruction. No intraluminal filling defects or stones period Bowel: Visualized small and large bowel are not abnormally distended. No inflammatory changes are appreciated. Vascular/Lymphatic:  Diffuse vascular calcifications.  No aneurysms. Reproductive: Prostate gland is mildly enlarged, measuring 4.4 cm diameter. Other:  No free air or free fluid in the pelvis. Musculoskeletal: Degenerative changes in the lower lumbar spine. Degenerative changes in both hips. SI joints and symphysis pubis are not displaced. Pelvis, sacrum, and hips appear intact. No acute displaced fractures are identified. There is a small focal cortical erosion along the inner table of the right iliac bone along the anterior inferior iliac spine region. There is no evidence of associated soft tissue mass. Etiology is  uncertain. This could represent a focal bone lesions such as metastasis or myeloma. Cortical destruction due to infection or osteomyelitis is also possible. No associated abscess or stranding. Correlation with clinical history is recommended. No additional lesions are identified. IMPRESSION: 1. No evidence of acute fracture or dislocation involving the pelvis or hips. 2. Degenerative changes in the lower lumbar spine and hips. 3. Small indeterminate area of cortical erosion in the right iliac bone. This could represent a focal bone lesion or osteomyelitis although there is no associated fluid collection or soft tissue mass. Correlate with clinical history. 4. Bladder wall thickening, trabeculation, and cellule formation likely representing chronic outlet obstruction change. 5. Mild prostate enlargement. 6. Diffuse vascular calcifications. Electronically Signed   By: Lucienne Capers M.D.   On: 09/28/2018 22:23   Dg Shoulder Left  Result Date: 09/28/2018 CLINICAL DATA:  [place] last night. EXAM: LEFT SHOULDER - 2+ VIEW COMPARISON:  None. FINDINGS: No evidence of fracture or dislocation. Narrowed humeral acromial distance can be seen with acute or chronic rotator cuff tear. IMPRESSION: No acute bone finding. Narrowed humeral acromial distance could indicate rotator cuff tear of unknown chronicity. Electronically Signed   By: Nelson Chimes M.D.   On: 09/28/2018 13:46   Dg Hip Unilat W Or Wo Pelvis 2-3 Views Right  Result Date: 09/28/2018 CLINICAL DATA:  [place] last night.  Pain. EXAM: DG HIP (WITH OR WITHOUT PELVIS) 2-3V RIGHT COMPARISON:  None. FINDINGS: No evidence of pelvic or hip fracture. Regional atherosclerosis and stents. Mild osteoarthritis of both hips. IMPRESSION: No acute or traumatic finding.  Mild osteoarthritis of the hips. Electronically Signed   By: Nelson Chimes M.D.   On: 09/28/2018 13:48    Medications:   . aspirin  81 mg Oral Daily  . atorvastatin  80 mg Oral q1800  . carvedilol  6.25 mg  Oral BID WC  . cholecalciferol  1,500 Units Oral Daily  . clopidogrel  75 mg Oral Q breakfast  . folic acid  1 mg Oral Daily  . gabapentin  200 mg Oral TID  . isosorbide mononitrate  30 mg Oral Q24H  . multivitamin with minerals  1 tablet Oral Daily  . pantoprazole  40 mg Oral Daily  . senna-docusate  2 tablet Oral QHS  . sodium chloride flush  3 mL Intravenous Q12H  . [START ON 09/30/2018] thiamine  100 mg Oral Daily  . venlafaxine XR  75 mg Oral Q breakfast  . vitamin B-12  1,000 mcg Oral Daily   Continuous Infusions: . sodium chloride 100 mL/hr at 09/29/18 1053     LOS: 0 days   Geradine Girt  [facility]   *Please refer to [email or web address], password [email or web address] to get updated schedule on who will round on this patient, as hospitalists switch teams weekly. If 7PM-7AM, please contact night-coverage at [email or web address], password [email or web address] for any overnight needs.  09/29/2018, 2:44 PM

## 2018-09-29 NOTE — Progress Notes (Signed)
*  PRELIMINARY RESULTS* Vascular Ultrasound Carotid Duplex (Doppler) has been completed.  Findings suggest 1-39% internal carotid artery stenosis bilaterally. Vertebral arteries are patent with antegrade flow.  09/29/2018 3:37 PM Maudry Mayhew, MHA, RVT, RDCS, RDMS

## 2018-09-29 NOTE — Progress Notes (Deleted)
*  PRELIMINARY RESULTS* Echocardiogram 2D Echocardiogram has been performed.  Vincent Dunlap 09/29/2018, 11:03 AM

## 2018-09-29 NOTE — Progress Notes (Signed)
When taking patient's orthostatic vitals, BP dropped to 84S systolic while sitting and HR increased. Pt stated felt really dizzy. Pt laid back in bed, provider notified.

## 2018-09-29 NOTE — Progress Notes (Signed)
  Echocardiogram 2D Echocardiogram has been performed.  Vincent Dunlap 09/29/2018, 11:03 AM

## 2018-09-29 NOTE — Accreditation Note (Signed)
Given report to [place], Therapist, sports.

## 2018-09-30 DIAGNOSIS — J449 Chronic obstructive pulmonary disease, unspecified: Secondary | ICD-10-CM

## 2018-09-30 DIAGNOSIS — I5022 Chronic systolic (congestive) heart failure: Secondary | ICD-10-CM

## 2018-09-30 DIAGNOSIS — G9341 Metabolic encephalopathy: Secondary | ICD-10-CM

## 2018-09-30 LAB — GLUCOSE, CAPILLARY
GLUCOSE-CAPILLARY: 100 mg/dL — AB (ref 70–99)
Glucose-Capillary: 86 mg/dL (ref 70–99)
Glucose-Capillary: 124 mg/dL — ABNORMAL HIGH (ref 70–99)

## 2018-09-30 NOTE — Plan of Care (Signed)

## 2018-09-30 NOTE — Progress Notes (Signed)
PROGRESS NOTE    Vincent Dunlap  IOE:[id number] DOB: Apr 12, 1941 DOA: 09/28/2018 PCP: Hendricks Limes, MD    Brief Narrative:  77 year old male who presented after syncope episode.  He does have significant past medical history coronary artery disease, advanced COPD, peripheral vascular disease, protein calorie malnutrition, and alcohol abuse.  Patient suffered syncope episode, preceded by orthostatic symptoms, felt dizzy, lightheaded after standing from a supine position.  He does have ambulatory  dysfunction, uses a walker.  On his initial physical examination blood pressure 153/87, heart rate 90, temperature 97.8, respiratory rate 18, oxygen saturation 98%.  Dry mucous membranes, lungs clear to auscultation bilaterally, heart S1-S2 present rhythmic, abdomen soft nontender, lower extremity edema, neurologically patient was nonfocal.  Sodium 132, potassium 3.7, chloride 97, bicarb 24, glucose 132, BUN 14, creatinine 1.24, AST 42, ALT 51, troponin less than 0.03, white count 8.4, hemoglobin 14.2, hematocrit 42.4, platelets 48, also is negative for infection.  Drug screen negative.  CT head with no acute changes, positive cerebral atrophy, ventriculomegaly and periventricular white matter disease.  No acute cervical spine changes.  CT of the pelvis negative.  EKG rhythm, normal axis, inferior- lateral T wave inversions with ST depressions (V4-V6) (new), positive LVH.   Patient was admitted to the hospital with working diagnosis of orthostatic syncope.   Assessment & Plan:   Principal Problem:   Syncope Active Problems:   Thrombocytopenia ([place])   CAD (coronary artery disease), native coronary artery   Chronic systolic CHF (congestive heart failure) (HCC)   COPD GOLD II   Severe protein-calorie malnutrition (HCC)   B12 deficiency   Acute metabolic encephalopathy   Fall   Acute kidney injury ([place])   Alcohol use  1. Orthostatic syncope with metabolic encephalopathy. Patient currently off IV  fluids, continue neuro checks per unit protocol. Reports double vision and hallucinations, possible head contusion. Will continue physical therapy evaluation, patient will need SNF.   2. Diastolic heart failure. No signs of acute exacerbation, will continue blood pressure monitoring, patient tolerating well carvedilol and isosorbide.   3. Coronary artery disease. Patient chest pain free, will continua dual antiplatelet therapy with asa and clopidogrel, along with atorvastatin.  4. Dehydration with hypokalemia and hyponatremia. Patient tolerating po well, no nausea or vomiting, Na at 130, with preserved renal function with serum cr at 0.95, with k at 3,4 and serum bicarbonate at 22.   5. COPD. No signs of exacerbation, will continue oxymetry monitoring. As needed bronchodilators.   6. Severe calorie protein malnutrition. Will continue nutritional supplements.   7. Hx of alcohol abuse. Patient with hallucinations, but no tremors or agitation, will continue with as needed lorazepam per CIWA protocol. Continue thiamine.   8. Depression. Continue venlafaxine.   DVT prophylaxis: enoxaparin   Code Status:  full Family Communication: no family at the bedside  Disposition Plan/ discharge barriers: pending placement at SNF  Body mass index is 22.77 kg/m. Malnutrition Type:      Malnutrition Characteristics:      Nutrition Interventions:     RN Pressure Injury Documentation: Pressure Injury 07/17/17 Stage II -  Partial thickness loss of dermis presenting as a shallow open ulcer with a red, pink wound bed without slough. (Active)  07/17/17 0215   Location: Sacrum  Location Orientation: Mid  Staging: Stage II -  Partial thickness loss of dermis presenting as a shallow open ulcer with a red, pink wound bed without slough.  Wound Description (Comments):   Present on Admission: Yes  Pressure Ulcer (Active)      Location: Ankle  Location Orientation: Left  Staging:   Wound  Description (Comments):   Present on Admission:      Pressure Injury 07/17/17 Unstageable - Full thickness tissue loss in which the base of the ulcer is covered by slough (yellow, tan, gray, green or brown) and/or eschar (tan, brown or black) in the wound bed. 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  Wound Description (Comments): R 3rd toe arterial/venous ulcer that is gangrenous  Present on Admission: Yes     Pressure Injury 07/18/17 (Active)  07/18/17 0859   Location: Arm  Location Orientation: Right  Staging:   Wound Description (Comments):   Present on Admission:      Consultants:     Procedures:     Antimicrobials:       Subjective: Patient is having hallucinations, no agitation or confusion, no tremors or anxiety. No nausea or vomiting, continue to be very weak and deconditioned.   Objective: Vitals:   09/29/18 2038 09/30/18 0123 09/30/18 0520 09/30/18 0821  BP: (!) 139/93 113/78 100/65 116/73  Pulse: 91 80 67 71  Resp: 18 16 18 16   Temp: 97.8 F (36.6 C) 97.7 F (36.5 C) 97.6 F (36.4 C) (!) 97.5 F (36.4 C)  TempSrc: Oral Oral Oral Oral  SpO2: 95% 97% 97% 97%  Weight:   75.1 kg   Height:        Intake/Output Summary (Last 24 hours) at 09/30/2018 1100 Last data filed at 09/30/2018 0000 Gross per 24 hour  Intake 465 ml  Output 900 ml  Net -435 ml   Filed Weights   09/28/18 1242 09/29/18 0500 09/30/18 0520  Weight: 88.5 kg 71 kg 75.1 kg    Examination:   General: deconditioned  Neurology: Awake and alert, non focal  E ENT: mild pallor, no icterus, oral mucosa moist Cardiovascular: No JVD. S1-S2 present, rhythmic, no gallops, rubs, or murmurs. No lower  extremity edema. Pulmonary: positive breath sounds bilaterally, adequate air movement, no wheezing, rhonchi or rales. Gastrointestinal. Abdomen with no organomegaly, non tender, no rebound or guarding Skin. No rashes Musculoskeletal: no joint deformities     Data Reviewed: I have personally reviewed following labs and imaging studies  CBC: Recent Labs  Lab 09/28/18 1257 09/29/18 0941  WBC 8.4 6.1  NEUTROABS 6.3  --   HGB 14.2 11.6*  HCT 42.4 35.4*  MCV 86.7 87.4  PLT 48* 50*   Basic Metabolic Panel: Recent Labs  Lab 09/28/18 1257 09/29/18 0941  NA 132* 130*  K 3.7 3.4*  CL 97* 99  CO2 24 22  GLUCOSE 132* 152*  BUN 14 13  CREATININE 1.24 0.95  CALCIUM 9.3 8.4*   GFR: Estimated Creatinine Clearance: 69.2 mL/min (by C-G formula based on SCr of 0.95 mg/dL). Liver Function Tests: Recent Labs  Lab 09/28/18 1257  AST 42*  ALT 51*  ALKPHOS 135*  BILITOT 1.0  PROT 6.9  ALBUMIN 3.7   No results for input(s): LIPASE, AMYLASE in the last 168 hours. Recent Labs  Lab 09/28/18 1751  AMMONIA <9*   Coagulation Profile: No results for input(s): INR, PROTIME in the last 168 hours.  Cardiac Enzymes: Recent Labs  Lab 09/28/18 1257 09/28/18 2118 09/29/18 0220 09/29/18 0941  CKTOTAL 82  --   --   --   TROPONINI  --  <0.03 <0.03 <0.03   BNP (last 3 results) No results for input(s): PROBNP in the last 8760 hours. HbA1C: No results for input(s): HGBA1C in the last 72 hours. CBG: Recent Labs  Lab 09/29/18 0557 09/29/18 1200  GLUCAP 111* 111*   Lipid Profile: No results for input(s): CHOL, HDL, LDLCALC, TRIG, CHOLHDL, LDLDIRECT in the last 72 hours. Thyroid Function Tests: No results for input(s): TSH, T4TOTAL, FREET4, T3FREE, THYROIDAB in the last 72 hours. Anemia Panel: Recent Labs    09/28/18 2118  VITAMINB12 1,005*      Radiology Studies: I have reviewed all of the imaging during this hospital visit personally     Scheduled Meds: . aspirin  81  mg Oral Daily  . atorvastatin  80 mg Oral q1800  . carvedilol  6.25 mg Oral BID WC  . cholecalciferol  1,500 Units Oral Daily  . clopidogrel  75 mg Oral Q breakfast  . folic acid  1 mg Oral Daily  . gabapentin  200 mg Oral TID  . isosorbide mononitrate  30 mg Oral Q24H  . multivitamin with minerals  1 tablet Oral Daily  . pantoprazole  40 mg Oral Daily  . senna-docusate  2 tablet Oral QHS  . sodium chloride flush  3 mL Intravenous Q12H  . thiamine  100 mg Oral Daily  . venlafaxine XR  75 mg Oral Q breakfast  . vitamin B-12  1,000 mcg Oral Daily   Continuous Infusions:   LOS: 1 day        Vincent Keaney Gerome Apley, MD [facility] Pager [phone]

## 2018-10-01 DIAGNOSIS — G9340 Encephalopathy, unspecified: Secondary | ICD-10-CM

## 2018-10-01 DIAGNOSIS — I25118 Atherosclerotic heart disease of native coronary artery with other forms of angina pectoris: Secondary | ICD-10-CM

## 2018-10-01 LAB — GLUCOSE, CAPILLARY: Glucose-Capillary: 86 mg/dL (ref 70–99)

## 2018-10-01 MED ORDER — CARVEDILOL 3.125 MG PO TABS
3.1250 mg | ORAL_TABLET | Freq: Two times a day (BID) | ORAL | Status: DC
  Administered 2018-10-01 – 2018-10-03 (×5): 3.125 mg via ORAL
  Filled 2018-10-01 (×5): qty 1

## 2018-10-01 MED ORDER — POLYETHYLENE GLYCOL 3350 17 G PO PACK
17.0000 g | PACK | Freq: Two times a day (BID) | ORAL | Status: DC
  Administered 2018-10-01 – 2018-10-03 (×4): 17 g via ORAL
  Filled 2018-10-01 (×4): qty 1

## 2018-10-01 NOTE — Progress Notes (Signed)
Patient resting comfortably during shift report. Denies complaints.  

## 2018-10-01 NOTE — Progress Notes (Signed)
Low bed requested for patient due to CIWA, and that he was found on the floor prior to admission.

## 2018-10-01 NOTE — Progress Notes (Signed)
PROGRESS NOTE    Vincent Dunlap  BTD:[id number] DOB: 1941-06-02 DOA: 09/28/2018 PCP: Hendricks Limes, MD    Brief Narrative:  77 year old male who presented after syncope episode.  He does have significant past medical history coronary artery disease, advanced COPD, peripheral vascular disease, protein calorie malnutrition, and alcohol abuse.  Patient suffered syncope episode, preceded by orthostatic symptoms, felt dizzy, lightheaded after standing from a supine position.  He does have ambulatory  dysfunction, uses a walker.  On his initial physical examination blood pressure 153/87, heart rate 90, temperature 97.8, respiratory rate 18, oxygen saturation 98%.  Dry mucous membranes, lungs clear to auscultation bilaterally, heart S1-S2 present rhythmic, abdomen soft nontender, lower extremity edema, neurologically patient was nonfocal.  Sodium 132, potassium 3.7, chloride 97, bicarb 24, glucose 132, BUN 14, creatinine 1.24, AST 42, ALT 51, troponin less than 0.03, white count 8.4, hemoglobin 14.2, hematocrit 42.4, platelets 48, also is negative for infection.  Drug screen negative.  CT head with no acute changes, positive cerebral atrophy, ventriculomegaly and periventricular white matter disease.  No acute cervical spine changes.  CT of the pelvis negative.  EKG rhythm, normal axis, inferior- lateral T wave inversions with ST depressions (V4-V6) (new), positive LVH.   Patient was admitted to the hospital with working diagnosis of orthostatic syncope.    Assessment & Plan:   Principal Problem:   Syncope Active Problems:   Thrombocytopenia ([place])   CAD (coronary artery disease), native coronary artery   Chronic systolic CHF (congestive heart failure) (HCC)   COPD GOLD II   Severe protein-calorie malnutrition (HCC)   B12 deficiency   Acute metabolic encephalopathy   Fall   Acute kidney injury ([place])   Alcohol use   1. Orthostatic syncope with metabolic encephalopathy/ possible brain  contussion. Systolic blood pressure has been low 100 to 93 mmHg. Will continue neuro checks per unit protocol and fall precautions. Continue to have hallucinations, no agitation. Pending placement in SNF.  2. Diastolic heart failure. Episodic hypotension, will discontinue isosorbide and will continue low dose of carvedilol. No clinical signs of acute exacerbation.   3. Coronary artery disease. On dual antiplatelet therapy with asa and clopidogrel. Continue statin therapy.   4. Dehydration with hypokalemia and hyponatremia. Patient has been tolerating po well, will follow on renal panel in am. Avoid hypotension and nephrotoxic medications   5. COPD. No current signs of exacerbation, oxymetry monitoring. bronchodilators.   6. Severe calorie protein malnutrition. On nutritional supplements.   7. Hx of alcohol abuse. Positive hallucinations, no anxiety or tremors, will continue thiamine. Continue as needed benzodiazepines per CIWA protocol.    8. Depression. On venlafaxine.   DVT prophylaxis: enoxaparin   Code Status:  full Family Communication: no family at the bedside  Disposition Plan/ discharge barriers: pending placement at SNF  Body mass index is 23.07 kg/m. Malnutrition Type:      Malnutrition Characteristics:      Nutrition Interventions:     RN Pressure Injury Documentation: Pressure Injury 07/17/17 Stage II -  Partial thickness loss of dermis presenting as a shallow open ulcer with a red, pink wound bed without slough. (Active)  07/17/17 0215   Location: Sacrum  Location Orientation: Mid  Staging: Stage II -  Partial thickness loss of dermis presenting as a shallow open ulcer with a red, pink wound bed without slough.  Wound Description (Comments):   Present on Admission: Yes     Pressure Ulcer (Active)      Location: Ankle  Location Orientation: Left  Staging:   Wound Description (Comments):   Present on Admission:      Pressure Injury 07/17/17  Unstageable - Full thickness tissue loss in which the base of the ulcer is covered by slough (yellow, tan, gray, green or brown) and/or eschar (tan, brown or black) in the wound bed. 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  Wound Description (Comments): R 3rd toe arterial/venous ulcer that is gangrenous  Present on Admission: Yes     Pressure Injury 07/18/17 (Active)  07/18/17 0859   Location: Arm  Location Orientation: Right  Staging:   Wound Description (Comments):   Present on Admission:      Consultants:     Procedures:     Antimicrobials:       Subjective: Patient continue to have hallucinations and double vision, no agitation. Tolerating po well, no nausea or vomiting.  Objective: Vitals:   10/01/18 0135 10/01/18 0458 10/01/18 0504 10/01/18 0840  BP: 126/80  129/81 124/80  Pulse: 81  84 88  Resp: 20  18   Temp: 97.7 F (36.5 C)  97.6 F (36.4 C)   TempSrc: Oral  Oral   SpO2: 97%  97%   Weight:  76.1 kg    Height:        Intake/Output Summary (Last 24 hours) at 10/01/2018 1015 Last data filed at 10/01/2018 0919 Gross per 24 hour  Intake 600 ml  Output 1175 ml  Net -575 ml   Filed Weights   09/29/18 0500 09/30/18 0520 10/01/18 0458  Weight: 71 kg 75.1 kg 76.1 kg    Examination:   General: deconditioned  Neurology: Awake and alert, non focal  E ENT: mild pallor, no icterus, oral mucosa moist Cardiovascular: No JVD. S1-S2 present, rhythmic, no gallops, rubs, or murmurs. No lower extremity edema. Pulmonary: positive breath sounds bilaterally, adequate air movement, no wheezing, rhonchi or rales. Gastrointestinal. Abdomen with no organomegaly, non tender, no rebound  or guarding Skin. No rashes Musculoskeletal: no joint deformities     Data Reviewed: I have personally reviewed following labs and imaging studies  CBC: Recent Labs  Lab 09/28/18 1257 09/29/18 0941  WBC 8.4 6.1  NEUTROABS 6.3  --   HGB 14.2 11.6*  HCT 42.4 35.4*  MCV 86.7 87.4  PLT 48* 50*   Basic Metabolic Panel: Recent Labs  Lab 09/28/18 1257 09/29/18 0941  NA 132* 130*  K 3.7 3.4*  CL 97* 99  CO2 24 22  GLUCOSE 132* 152*  BUN 14 13  CREATININE 1.24 0.95  CALCIUM 9.3 8.4*   GFR: Estimated Creatinine Clearance: 70.1 mL/min (by C-G formula based on SCr of 0.95 mg/dL). Liver Function Tests: Recent Labs  Lab 09/28/18 1257  AST 42*  ALT 51*  ALKPHOS 135*  BILITOT 1.0  PROT 6.9  ALBUMIN 3.7   No results for input(s): LIPASE, AMYLASE in the last 168 hours. Recent Labs  Lab 09/28/18 1751  AMMONIA <9*   Coagulation Profile: No results for input(s): INR, PROTIME in the last 168 hours. Cardiac Enzymes: Recent Labs  Lab 09/28/18 1257 09/28/18 2118 09/29/18 0220 09/29/18 0941  CKTOTAL 82  --   --   --  TROPONINI  --  <0.03 <0.03 <0.03   BNP (last 3 results) No results for input(s): PROBNP in the last 8760 hours. HbA1C: No results for input(s): HGBA1C in the last 72 hours. CBG: Recent Labs  Lab 09/29/18 1200 09/30/18 0747 09/30/18 1151 09/30/18 1734 10/01/18 0741  GLUCAP 111* 86 124* 100* 86   Lipid Profile: No results for input(s): CHOL, HDL, LDLCALC, TRIG, CHOLHDL, LDLDIRECT in the last 72 hours. Thyroid Function Tests: No results for input(s): TSH, T4TOTAL, FREET4, T3FREE, THYROIDAB in the last 72 hours. Anemia Panel: Recent Labs    09/28/18 2118  VITAMINB12 1,005*      Radiology Studies: I have reviewed all of the imaging during this hospital visit personally     Scheduled Meds: . aspirin  81 mg Oral Daily  . atorvastatin  80 mg Oral q1800  . carvedilol  6.25 mg Oral BID WC  . cholecalciferol  1,500 Units Oral Daily  .  clopidogrel  75 mg Oral Q breakfast  . folic acid  1 mg Oral Daily  . gabapentin  200 mg Oral TID  . isosorbide mononitrate  30 mg Oral Q24H  . multivitamin with minerals  1 tablet Oral Daily  . pantoprazole  40 mg Oral Daily  . senna-docusate  2 tablet Oral QHS  . sodium chloride flush  3 mL Intravenous Q12H  . thiamine  100 mg Oral Daily  . venlafaxine XR  75 mg Oral Q breakfast  . vitamin B-12  1,000 mcg Oral Daily   Continuous Infusions:   LOS: 2 days        Tivis Wherry Gerome Apley, MD [facility] Pager [phone]

## 2018-10-01 NOTE — Progress Notes (Signed)
Page to MD  3e12 [place]. pt requesting rx to help have BM.

## 2018-10-01 NOTE — Plan of Care (Signed)
  Problem: Coping: Goal: Level of anxiety will decrease Outcome: Progressing   Problem: Pain Managment: Goal: General experience of comfort will improve Outcome: Progressing   Problem: Safety: Goal: Ability to remain free from injury will improve Outcome: Progressing   

## 2018-10-02 LAB — GLUCOSE, CAPILLARY: Glucose-Capillary: 82 mg/dL (ref 70–99)

## 2018-10-02 MED ORDER — INFLUENZA VAC SPLIT HIGH-DOSE 0.5 ML IM SUSY
0.5000 mL | PREFILLED_SYRINGE | INTRAMUSCULAR | Status: DC
  Filled 2018-10-02: qty 0.5

## 2018-10-02 NOTE — Clinical Social Work Placement (Signed)
   CLINICAL SOCIAL WORK PLACEMENT  NOTE  Date:  10/02/2018  Patient Details  Name: Vincent Dunlap MRN: [id number] Date of Birth: 04-04-1941  Clinical Social Work is seeking post-discharge placement for this patient at the [place] level of care (*CSW will initial, date and re-position this form in  chart as items are completed):  Yes   Patient/family provided with [facility] Work Department's list of facilities offering this level of care within the geographic area requested by the patient (or if unable, by the patient's family).  Yes   Patient/family informed of their freedom to choose among providers that offer the needed level of care, that participate in Medicare, Medicaid or managed care program needed by the patient, have an available bed and are willing to accept the patient.  Yes   Patient/family informed of [facility]'s ownership interest in [facility] and [facility], as well as of the fact that they are under no obligation to receive care at these facilities.  PASRR submitted to EDS on 10/02/18     PASRR number received on       Existing PASRR number confirmed on 10/02/18     FL2 transmitted to all facilities in geographic area requested by pt/family on 10/02/18     FL2 transmitted to all facilities within larger geographic area on       Patient informed that his/her managed care company has contracts with or will negotiate with certain facilities, including the following:            Patient/family informed of bed offers received.  Patient chooses bed at       Physician recommends and patient chooses bed at      Patient to be transferred to   on  .  Patient to be transferred to facility by       Patient family notified on   of transfer.  Name of family member notified:        PHYSICIAN Please sign FL2     Additional Comment:    _______________________________________________ Candie Chroman, LCSW 10/02/2018,  12:24 PM

## 2018-10-02 NOTE — Progress Notes (Signed)
Physical Therapy Treatment Patient Details Name: Vincent Dunlap MRN: [id number] DOB: 02-Aug-1941 Today's Date: 10/02/2018    History of Present Illness Pt is a 77 y.o. male admitted for syncope. PMH positive for HTN, CHF, CAD, COPD, PVD, neuropathy.     PT Comments    Pt admitted with above diagnosis. Pt currently with functional limitations due to the deficits listed below (see PT Problem List). 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 Initiated x1 exercises.  Pt still needs SNF as he requires mod assist to mobilize. Pt will benefit from skilled PT to increase their independence and safety with mobility to allow discharge to the venue listed below.     Follow Up Recommendations  SNF with vestibular rehab.      Equipment Recommendations  Rolling walker with 5" wheels    Recommendations for Other Services       Precautions / Restrictions Precautions Precautions: Fall Restrictions Weight Bearing Restrictions: No    Mobility  Bed Mobility Overal bed mobility: Needs Assistance Bed Mobility: Supine to Sit     Supine to sit: Min assist     General bed mobility comments: incr time as pt using momentum.  Transfers Overall transfer level: Needs assistance Equipment used: 2 person hand held assist Transfers: Sit to/from [facility] Sit to Stand: Mod assist Stand pivot transfers: Mod assist       General transfer comment: unsteady with pt leaning posteriorly and unsteady on feet  Ambulation/Gait                 Stairs             Wheelchair Mobility    Modified Rankin (Stroke Patients Only)       Balance Overall balance assessment: Needs assistance Sitting-balance support: Bilateral upper extremity supported;Single extremity supported;Feet supported Sitting balance-Leahy Scale: Poor Sitting balance - Comments: Can sit on his own with UE support   Standing balance support: Bilateral upper extremity supported;Single extremity  supported Standing balance-Leahy Scale: Poor Standing balance comment: RW for support in standing, mod assist due to lean                            Cognition Arousal/Alertness: Awake/alert Behavior During Therapy: WFL for tasks assessed/performed Overall Cognitive Status: Impaired/Different from baseline Area of Impairment: Memory;Problem solving;Safety/judgement;Attention(higher level processing)                   Current Attention Level: Divided;Selective Memory: Decreased short-term memory   Safety/Judgement: Decreased awareness of deficits Awareness: Intellectual Problem Solving: Requires verbal cues;Requires tactile cues General Comments: A&Ox4, pt has difficulty following commands but unsure how much is related to [facility] vs cognitive deficit. Pt appears distracted by internal thoughts.       Exercises Other Exercises Other Exercises: x1 exercises initiated.    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 condom cath came off.  Had to change all linens andn clean pt      Pertinent Vitals/Pain Pain Assessment: No/denies pain    Home Living                      Prior Function            PT Goals (current goals can now be found in the care plan section) Acute Rehab PT Goals Patient Stated Goal: be less dizzy Progress towards PT goals: Progressing toward goals  Frequency    Min 2X/week      PT Plan Current plan remains appropriate    Co-evaluation              AM-PAC PT "6 Clicks" Mobility   Outcome Measure  Help needed turning from your back to your side while in a flat bed without using bedrails?: A Little Help needed moving from lying on your back to sitting on the side of a flat bed without using bedrails?: A Little Help needed moving to and from a bed to a chair (including a wheelchair)?: A Lot Help needed standing up from a chair using your arms (e.g.,  wheelchair or bedside chair)?: A Lot Help needed to walk in hospital room?: A Lot Help needed climbing 3-5 steps with a railing? : Total 6 Click Score: 13    End of Session Equipment Utilized During Treatment: Gait belt Activity Tolerance: Patient tolerated treatment well Patient left: in bed;with bed alarm set;with call bell/phone within reach Nurse Communication: Mobility status(condom cath needed to be replaced - notified NT) PT Visit Diagnosis: Dizziness and giddiness (R42);Muscle weakness (generalized) (M62.81);Unsteadiness on feet (R26.81)     Time: 1100-1140 PT Time Calculation (min) (ACUTE ONLY): 40 min  Charges:  $Therapeutic Exercise: 8-22 mins $Therapeutic Activity: 23-37 mins                     [place] Pager:  [phone]  Office:  [place] 10/02/2018, 1:40 PM
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
## 2018-10-02 NOTE — Progress Notes (Signed)
PROGRESS NOTE    Vincent Dunlap  ZOX:[id number] DOB: 1940-12-05 DOA: 09/28/2018 PCP: Hendricks Limes, MD    Brief Narrative:  77 year old male who presented after syncope episode.  He does have significant past medical history coronary artery disease, advanced COPD, peripheral vascular disease, protein calorie malnutrition, and alcohol abuse.  Patient suffered syncope episode, preceded by orthostatic symptoms, felt dizzy, lightheaded after standing from a supine position.  He does have ambulatory  dysfunction, uses a walker.  On his initial physical examination blood pressure 153/87, heart rate 90, temperature 97.8, respiratory rate 18, oxygen saturation 98%.  Dry mucous membranes, lungs clear to auscultation bilaterally, heart S1-S2 present rhythmic, abdomen soft nontender, lower extremity edema, neurologically patient was nonfocal.  Sodium 132, potassium 3.7, chloride 97, bicarb 24, glucose 132, BUN 14, creatinine 1.24, AST 42, ALT 51, troponin less than 0.03, white count 8.4, hemoglobin 14.2, hematocrit 42.4, platelets 48, also is negative for infection.  Drug screen negative.  CT head with no acute changes, positive cerebral atrophy, ventriculomegaly and periventricular white matter disease.  No acute cervical spine changes.  CT of the pelvis negative.  EKG rhythm, normal axis, inferior- lateral T wave inversions with ST depressions (V4-V6) (new), positive LVH.   Patient was admitted to the hospital with working diagnosis of orthostatic syncope.   Assessment & Plan:   Principal Problem:   Syncope Active Problems:   Thrombocytopenia ([place])   CAD (coronary artery disease), native coronary artery   Chronic systolic CHF (congestive heart failure) (HCC)   COPD GOLD II   Severe protein-calorie malnutrition (HCC)   B12 deficiency   Acute metabolic encephalopathy   Fall   Acute kidney injury ([place])   Alcohol use   1. Orthostatic syncope with metabolic encephalopathy/ head contussion. His  resting blood pressure has been stable at 121/77 mmHg this am. Continue physical therapy evaluation, pending placement at SNF.Continue to report double vision and hallucinations.   2. Diastolic heart failure. Continue with carvedilol and isosorbide.   3. Coronary artery disease. On dual antiplatelet therapy with asa and clopidogrel. Continue atorvastatin.  4. Dehydration with hypokalemia and hyponatremia. No further blood work, patient is tolerating po well, no nausea or vomiting.   5. COPD. No active clinical signs of exacerbation. As needed bronchodilators and supplemental 02 per [employer].   6. Severe calorie protein malnutrition. On nutritional supplements.   7. Hx of alcohol abuse. Will continue thiamine and multivitamins, continue to roport hallucinations, but no tremors or agitation. No apparent severe symptoms.   8. Depression. On venlafaxine.   DVT prophylaxis: enoxaparin   Code Status:  full Family Communication: no family at the bedside  Disposition Plan/ discharge barriers: pending placement at SNF  Body mass index is 24.76 kg/m. Malnutrition Type:      Malnutrition Characteristics:      Nutrition Interventions:     RN Pressure Injury Documentation: Pressure Injury 07/17/17 Stage II -  Partial thickness loss of dermis presenting as a shallow open ulcer with a red, pink wound bed without slough. (Active)  07/17/17 0215   Location: Sacrum  Location Orientation: Mid  Staging: Stage II -  Partial thickness loss of dermis presenting as a shallow open ulcer with a red, pink wound bed without slough.  Wound Description (Comments):   Present on Admission: Yes     Pressure Ulcer (Active)      Location: Ankle  Location Orientation: Left  Staging:   Wound Description (Comments):   Present on Admission:  Pressure Injury 07/17/17 Unstageable - Full thickness tissue loss in which the base of the ulcer is covered by slough (yellow, tan, gray, green or brown)  and/or eschar (tan, brown or black) in the wound bed. 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  Wound Description (Comments): R 3rd toe arterial/venous ulcer that is gangrenous  Present on Admission: Yes     Pressure Injury 07/18/17 (Active)  07/18/17 0859   Location: Arm  Location Orientation: Right  Staging:   Wound Description (Comments):   Present on Admission:      Consultants:     Procedures:     Antimicrobials:       Subjective: Patient continue to have double vision and hallucinations, no agitation or confusion, reported decreased memory. No nausea or vomiting, no chest pain or dyspnea.   Objective: Vitals:   10/01/18 1240 10/01/18 1700 10/01/18 2027 10/02/18 0549  BP: (!) 93/58 117/78 110/71 (!) 156/97  Pulse: 63 71 70 80  Resp: 15  20 20   Temp: (!) 97.5 F (36.4 C)  97.6 F (36.4 C) (!) 97.5 F (36.4 C)  TempSrc: Oral  Oral Oral  SpO2: 98%  98% 96%  Weight:    81.6 kg  Height:        Intake/Output Summary (Last 24 hours) at 10/02/2018 0942 Last data filed at 10/02/2018 0200 Gross per 24 hour  Intake 720 ml  Output 450 ml  Net 270 ml   Filed Weights   09/30/18 0520 10/01/18 0458 10/02/18 0549  Weight: 75.1 kg 76.1 kg 81.6 kg    Examination:   General: deconditioned  Neurology: Awake and alert, non focal  E ENT: mild pallor, no icterus, oral mucosa moist Cardiovascular: No JVD. S1-S2 present, rhythmic, no gallops, rubs, or murmurs. No lower extremity edema. Pulmonary: vesicular breath sounds bilaterally, adequate air movement, no wheezing, rhonchi or rales. Gastrointestinal. Abdomen with no organomegaly, non tender, no rebound or guarding Skin. No  rashes Musculoskeletal: no joint deformities     Data Reviewed: I have personally reviewed following labs and imaging studies  CBC: Recent Labs  Lab 09/28/18 1257 09/29/18 0941  WBC 8.4 6.1  NEUTROABS 6.3  --   HGB 14.2 11.6*  HCT 42.4 35.4*  MCV 86.7 87.4  PLT 48* 50*   Basic Metabolic Panel: Recent Labs  Lab 09/28/18 1257 09/29/18 0941  NA 132* 130*  K 3.7 3.4*  CL 97* 99  CO2 24 22  GLUCOSE 132* 152*  BUN 14 13  CREATININE 1.24 0.95  CALCIUM 9.3 8.4*   GFR: Estimated Creatinine Clearance: 70.5 mL/min (by C-G formula based on SCr of 0.95 mg/dL). Liver Function Tests: Recent Labs  Lab 09/28/18 1257  AST 42*  ALT 51*  ALKPHOS 135*  BILITOT 1.0  PROT 6.9  ALBUMIN 3.7   No results for input(s): LIPASE, AMYLASE in the last 168 hours. Recent Labs  Lab 09/28/18 1751  AMMONIA <9*   Coagulation Profile: No results for input(s): INR, PROTIME in the last 168 hours. Cardiac Enzymes: Recent Labs  Lab 09/28/18 1257 09/28/18 2118 09/29/18 0220 09/29/18 0941  CKTOTAL 82  --   --   --   TROPONINI  --  <  0.03 <0.03 <0.03   BNP (last 3 results) No results for input(s): PROBNP in the last 8760 hours. HbA1C: No results for input(s): HGBA1C in the last 72 hours. CBG: Recent Labs  Lab 09/30/18 0747 09/30/18 1151 09/30/18 1734 10/01/18 0741 10/02/18 0740  GLUCAP 86 124* 100* 86 82   Lipid Profile: No results for input(s): CHOL, HDL, LDLCALC, TRIG, CHOLHDL, LDLDIRECT in the last 72 hours. Thyroid Function Tests: No results for input(s): TSH, T4TOTAL, FREET4, T3FREE, THYROIDAB in the last 72 hours. Anemia Panel: No results for input(s): VITAMINB12, FOLATE, FERRITIN, TIBC, IRON, RETICCTPCT in the last 72 hours.    Radiology Studies: I have reviewed all of the imaging during this hospital visit personally     Scheduled Meds: . aspirin  81 mg Oral Daily  . atorvastatin  80 mg Oral q1800  . carvedilol  3.125 mg Oral BID WC  . cholecalciferol  1,500  Units Oral Daily  . clopidogrel  75 mg Oral Q breakfast  . folic acid  1 mg Oral Daily  . gabapentin  200 mg Oral TID  . [START ON 10/03/2018] Influenza vac split quadrivalent PF  0.5 mL Intramuscular Tomorrow-1000  . multivitamin with minerals  1 tablet Oral Daily  . pantoprazole  40 mg Oral Daily  . polyethylene glycol  17 g Oral BID  . senna-docusate  2 tablet Oral QHS  . sodium chloride flush  3 mL Intravenous Q12H  . thiamine  100 mg Oral Daily  . venlafaxine XR  75 mg Oral Q breakfast  . vitamin B-12  1,000 mcg Oral Daily   Continuous Infusions:   LOS: 3 days        Mauricio Gerome Apley, MD [facility] Pager [phone]
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
## 2018-10-02 NOTE — Plan of Care (Signed)
  Problem: Clinical Measurements: Goal: Diagnostic test results will improve Outcome: Progressing Goal: Respiratory complications will improve Outcome: Progressing   Problem: Activity: Goal: Risk for activity intolerance will decrease Outcome: Progressing   Problem: Nutrition: Goal: Adequate nutrition will be maintained Outcome: Progressing   Problem: Coping: Goal: Level of anxiety will decrease Outcome: Progressing   Problem: Pain Managment: Goal: General experience of comfort will improve Outcome: Progressing   Problem: Safety: Goal: Ability to remain free from injury will improve Outcome: Progressing

## 2018-10-03 DIAGNOSIS — I5032 Chronic diastolic (congestive) heart failure: Secondary | ICD-10-CM

## 2018-10-03 DIAGNOSIS — E43 Unspecified severe protein-calorie malnutrition: Secondary | ICD-10-CM

## 2018-10-03 DIAGNOSIS — G9340 Encephalopathy, unspecified: Secondary | ICD-10-CM

## 2018-10-03 LAB — GLUCOSE, CAPILLARY: GLUCOSE-CAPILLARY: 73 mg/dL (ref 70–99)

## 2018-10-03 LAB — VITAMIN B1: VITAMIN B1 (THIAMINE): 86.2 nmol/L (ref 66.5–200.0)

## 2018-10-03 MED ORDER — FUROSEMIDE 20 MG PO TABS: 20.0000 mg | ORAL_TABLET | Freq: Every day | ORAL | Status: AC | PRN

## 2018-10-03 NOTE — Progress Notes (Signed)
Second attempt to give report.

## 2018-10-03 NOTE — Discharge Summary (Signed)
Physician Discharge Summary  Vincent Dunlap GBT:[id number] DOB: 1941/09/05 DOA: 09/28/2018  PCP: Hendricks Limes, MD  Admit date: 09/28/2018 Discharge date: 10/03/2018  Admitted From: Home  Disposition:  SNF   Recommendations for Outpatient Follow-up and new medication changes:  1. Follow up with Dr. Linna Darner in 7 days 2. Use furosemide only as needed edema, or weight gain 3 lbs in 24 hours or 5 lbs in 7 days. 3. Holding losartan and spironolactone to prevent hypotension.   Home Health: na   Equipment/Devices: na    Discharge Condition: stable  CODE STATUS: full   Diet recommendation: heart healthy   Brief/Interim Summary: 77 year old male who presented aftersyncopeepisode. He does have significant past medical history coronary arterydisease, advanced COPD, peripheral vascular disease, protein calorie malnutrition,andalcohol abuse.Patient suffered syncope episode, preceded by orthostatic symptoms, felt dizzy, lightheaded after standingfrom a supine position. He does have ambulatory dysfunction,usesa walker. On his initial physical examination blood pressure 153/87, heart rate 90, temperature 97.8, respiratory rate18, oxygen saturation 98%.Dry mucous membranes, lungs clear to auscultation bilaterally, heart S1-S2 present and rhythmic, abdomen soft nontender, no lower extremity edema,neurologically patient was nonfocal.Sodium 132, potassium 3.7, chloride 97, bicarb 24, glucose 132, BUN 14, creatinine 1.24, AST 42, ALT 51, troponinless than 0.03,white count 8.4, hemoglobin 14.2, hematocrit 42.4, platelets 48,urine analysis negative for infection. Drug screen negative. CT head with no acute changes, positive cerebral atrophy, ventriculomegaly and periventricular white matter disease.No acutecervical spine changes. CT of thepelvis negative. EKG sinus rhythm, normal axis, inferior-lateral T wave inversions with ST depressions (V4-V6) (new),positive LVH.  Patient was  admitted to the hospital with working diagnosis of orthostatic syncope  1.  Orthostatic syncope, complicated by metabolic encephalopathy and head contusion.  Patient was admitted to the medical ward, he was placed on remote telemetry monitor, received IV fluids with good toleration, his antihypertensive agents and diuretics were held.  Further work-up with echocardiography showed ejection fraction left ventricle 45 to 50% with inferior lateral hypokinesis.  Discharge systolic blood pressure 710 mmHg. He continued to have orthostatic hypotension, supine 127/91 HR 91, sitting 121/93 HR 93, standing 115/93 HR 90.  Continue carvedilol and isosorbide, diuretics only as needed, continue holding ACE inhibitors.  Will need follow-up as an outpatient.  2.  Diastolic heart failure.  No signs of exacerbation, patient will continue carvedilol and isosorbide, for now will use diuretics only as needed, hold on ACE inhibitor's or ARB to prevent hypotension.   3.  Coronary artery disease.  Patient remained chest pain-free, continue dual therapy with aspirin, clopidogrel along with atorvastatin.  4.  Dehydration with hypokalemia and hyponatremia.  Patient received isotonic saline intravenously, improvement of electrolytes, discharge sodium 130, potassium 3.4, bicarb 22, serum creatinine 0.95.  Diuretics will be prescribed only as needed for signs of volume overload.   5.  COPD.  No signs of acute exacerbation.  Continue as needed bronchodilators.  6.  Severe calorie protein malnutrition.  Continue nutritional supplements.  7.  History of alcohol abuse. No signs of acute withdrawal, continue thiamine and multivitamins.  8.  Depression.  Continue Venlafaxine.    Discharge Diagnoses:  Principal Problem:   Syncope Active Problems:   Thrombocytopenia ([place])   CAD (coronary artery disease), native coronary artery   Chronic systolic CHF (congestive heart failure) (HCC)   COPD GOLD II   Severe protein-calorie  malnutrition (HCC)   B12 deficiency   Acute metabolic encephalopathy   Fall   Acute kidney injury ([place])   Alcohol use   Encephalopathy  Discharge Instructions   Allergies as of 10/03/2018      Reactions   Chantix [varenicline] Other (See Comments)   Pt states it made him "crazy"      Medication List    STOP taking these medications   losartan 50 MG tablet Commonly known as:  COZAAR   oxycodone 5 MG capsule Commonly known as:  OXY-IR   spironolactone 25 MG tablet Commonly known as:  ALDACTONE     TAKE these medications   acetaminophen 500 MG tablet Commonly known as:  TYLENOL Take 500 mg by mouth every 4 (four) hours as needed for mild pain or moderate pain.   aspirin 81 MG chewable tablet Chew 1 tablet (81 mg total) by mouth daily.   atorvastatin 80 MG tablet Commonly known as:  LIPITOR Take 1 tablet (80 mg total) by mouth daily at 6 PM.   bisacodyl 10 MG suppository Commonly known as:  DULCOLAX Place 1 suppository (10 mg total) rectally daily as needed for severe constipation.   carvedilol 6.25 MG tablet Commonly known as:  COREG Take 1 tablet (6.25 mg total) by mouth 2 (two) times daily with a meal.   clopidogrel 75 MG tablet Commonly known as:  PLAVIX Take 1 tablet (75 mg total) by mouth daily with breakfast.   desvenlafaxine 50 MG 24 hr tablet Commonly known as:  PRISTIQ Take 50 mg by mouth daily.   DIALYVITE VITAMIN D3 MAX 1.25 MG (50000 UT) Tabs Generic drug:  Cholecalciferol Take 1 tablet by mouth every 30 (thirty) days.   ENSURE ACTIVE Liqd Take 237 mLs by mouth 2 (two) times daily between meals.   NUTRITIONAL SUPPLEMENT Liqd Take 120 mLs by mouth 2 (two) times daily. MedPass   esomeprazole 40 MG capsule Commonly known as:  NEXIUM Take 40 mg by mouth daily at 12 noon.   folic acid 1 MG tablet Commonly known as:  FOLVITE Take 1 tablet (1 mg total) by mouth daily.   furosemide 20 MG tablet Commonly known as:  LASIX Take 1 tablet  (20 mg total) by mouth daily as needed for fluid or edema (weight gain 3 lbs in 24 hours or 5 lbs in 7 days.). What changed:    when to take this  reasons to take this   gabapentin 100 MG capsule Commonly known as:  NEURONTIN Take 200 mg by mouth 3 (three) times daily.   ipratropium-albuterol 0.5-2.5 (3) MG/3ML Soln Commonly known as:  DUONEB Take 3 mLs by nebulization 4 (four) times daily as needed (SOB).   isosorbide mononitrate 30 MG 24 hr tablet Commonly known as:  IMDUR Take 1 tablet (30 mg total) by mouth daily.   nitroGLYCERIN 0.4 MG SL tablet Commonly known as:  NITROSTAT Place 1 tablet (0.4 mg total) under the tongue every 5 (five) minutes as needed for chest pain.   polyethylene glycol packet Commonly known as:  MIRALAX / GLYCOLAX Take 17 g by mouth 2 (two) times daily.   senna-docusate 8.6-50 MG tablet Commonly known as:  Senokot-S Take 2 tablets by mouth at bedtime.   thiamine 100 MG tablet Take 1 tablet (100 mg total) by mouth daily.   vitamin B-12 1000 MCG tablet Commonly known as:  CYANOCOBALAMIN Take 1,000 mcg by mouth daily.      Contact information for after-discharge care    Destination    [facility] .   Service:  Skilled Nursing Contact information: [address] [phone]  Allergies  Allergen Reactions  . Chantix [Varenicline] Other (See Comments)    Pt states it made him "crazy"    Consultations:     Procedures/Studies: Dg Lumbar Spine Complete  Result Date: 09/28/2018 CLINICAL DATA:  [place] last night.  Back pain. EXAM: LUMBAR SPINE - COMPLETE 4+ VIEW COMPARISON:  10/10/2015 FINDINGS: Normal alignment. Chronic degenerative disc disease and degenerative facet disease. No evidence of fracture. No acute finding by radiography. IMPRESSION: Chronic degenerative changes.  No acute or traumatic finding. Electronically Signed   By: Nelson Chimes M.D.   On:  09/28/2018 13:47   Ct Head Wo Contrast  Result Date: 09/28/2018 CLINICAL DATA:  [place].  Hit head. EXAM: CT HEAD WITHOUT CONTRAST CT CERVICAL SPINE WITHOUT CONTRAST TECHNIQUE: Multidetector CT imaging of the head and cervical spine was performed following the standard protocol without intravenous contrast. Multiplanar CT image reconstructions of the cervical spine were also generated. COMPARISON:  CT scan 07/29/2009 FINDINGS: CT HEAD FINDINGS Brain: Progressive, age related, cerebral atrophy, ventriculomegaly and periventricular white matter disease. No extra-axial fluid collections are identified. No CT findings for acute hemispheric infarction or intracranial hemorrhage. No mass lesions. The brainstem and cerebellum are normal. Vascular: Atherosclerotic calcifications but no definite aneurysm or hyperdense vessels. Skull: No skull fracture or bone lesion. Remote nasal bone fractures are noted. Sinuses/Orbits: The paranasal sinuses and mastoid air cells are clear. The globes are intact. Other: Right frontal scalp hematoma and laceration but no radiopaque foreign body or underlying skull fracture. CT CERVICAL SPINE FINDINGS Alignment: Normal overall alignment. Moderate to advanced degenerative cervical spondylosis with multilevel disc disease and facet disease. Minimal multilevel degenerative subluxations. Skull base and vertebrae: The skull base C1 and C1-2 articulations are maintained. Moderate C1-2 degenerative changes with mild pannus formation. No acute cervical spine fracture. The facets are normally aligned. Soft tissues and spinal canal: No prevertebral fluid or swelling. No visible canal hematoma. Disc levels: No acute large disc protrusions. There is multilevel spinal and foraminal stenosis due to facet disease and uncinate spurring. Upper chest: Emphysematous changes but no apical lung lesions. Other: Advanced atherosclerotic calcifications involving the carotid arteries. No neck mass or adenopathy.  IMPRESSION: 1. Progressive age related cerebral atrophy, ventriculomegaly and periventricular white matter disease. 2. No acute intracranial findings or skull fracture. 3. Right frontal scalp hematoma near the vertex along with a laceration but no foreign body or underlying fracture. 4. Advanced degenerative cervical spondylosis with multilevel disc disease and facet disease but no acute cervical spine fracture. Electronically Signed   By: Marijo Sanes M.D.   On: 09/28/2018 14:28   Ct Cervical Spine Wo Contrast  Result Date: 09/28/2018 CLINICAL DATA:  [place].  Hit head. EXAM: CT HEAD WITHOUT CONTRAST CT CERVICAL SPINE WITHOUT CONTRAST TECHNIQUE: Multidetector CT imaging of the head and cervical spine was performed following the standard protocol without intravenous contrast. Multiplanar CT image reconstructions of the cervical spine were also generated. COMPARISON:  CT scan 07/29/2009 FINDINGS: CT HEAD FINDINGS Brain: Progressive, age related, cerebral atrophy, ventriculomegaly and periventricular white matter disease. No extra-axial fluid collections are identified. No CT findings for acute hemispheric infarction or intracranial hemorrhage. No mass lesions. The brainstem and cerebellum are normal. Vascular: Atherosclerotic calcifications but no definite aneurysm or hyperdense vessels. Skull: No skull fracture or bone lesion. Remote nasal bone fractures are noted. Sinuses/Orbits: The paranasal sinuses and mastoid air cells are clear. The globes are intact. Other: Right frontal scalp hematoma and laceration but no radiopaque foreign body or underlying skull fracture.  CT CERVICAL SPINE FINDINGS Alignment: Normal overall alignment. Moderate to advanced degenerative cervical spondylosis with multilevel disc disease and facet disease. Minimal multilevel degenerative subluxations. Skull base and vertebrae: The skull base C1 and C1-2 articulations are maintained. Moderate C1-2 degenerative changes with mild pannus  formation. No acute cervical spine fracture. The facets are normally aligned. Soft tissues and spinal canal: No prevertebral fluid or swelling. No visible canal hematoma. Disc levels: No acute large disc protrusions. There is multilevel spinal and foraminal stenosis due to facet disease and uncinate spurring. Upper chest: Emphysematous changes but no apical lung lesions. Other: Advanced atherosclerotic calcifications involving the carotid arteries. No neck mass or adenopathy. IMPRESSION: 1. Progressive age related cerebral atrophy, ventriculomegaly and periventricular white matter disease. 2. No acute intracranial findings or skull fracture. 3. Right frontal scalp hematoma near the vertex along with a laceration but no foreign body or underlying fracture. 4. Advanced degenerative cervical spondylosis with multilevel disc disease and facet disease but no acute cervical spine fracture. Electronically Signed   By: Marijo Sanes M.D.   On: 09/28/2018 14:28   Ct Pelvis Wo Contrast  Result Date: 09/28/2018 CLINICAL DATA:  Right hip and groin pain after a fall. EXAM: CT PELVIS WITHOUT CONTRAST TECHNIQUE: Multidetector CT imaging of the pelvis was performed following the standard protocol without intravenous contrast. COMPARISON:  Radiographs 09/28/2018 FINDINGS: Urinary Tract: Diffuse bladder wall thickening with cellule formation and trabeculation likely representing outlet obstruction. No intraluminal filling defects or stones period Bowel: Visualized small and large bowel are not abnormally distended. No inflammatory changes are appreciated. Vascular/Lymphatic: Diffuse vascular calcifications.  No aneurysms. Reproductive: Prostate gland is mildly enlarged, measuring 4.4 cm diameter. Other:  No free air or free fluid in the pelvis. Musculoskeletal: Degenerative changes in the lower lumbar spine. Degenerative changes in both hips. SI joints and symphysis pubis are not displaced. Pelvis, sacrum, and hips appear  intact. No acute displaced fractures are identified. There is a small focal cortical erosion along the inner table of the right iliac bone along the anterior inferior iliac spine region. There is no evidence of associated soft tissue mass. Etiology is uncertain. This could represent a focal bone lesions such as metastasis or myeloma. Cortical destruction due to infection or osteomyelitis is also possible. No associated abscess or stranding. Correlation with clinical history is recommended. No additional lesions are identified. IMPRESSION: 1. No evidence of acute fracture or dislocation involving the pelvis or hips. 2. Degenerative changes in the lower lumbar spine and hips. 3. Small indeterminate area of cortical erosion in the right iliac bone. This could represent a focal bone lesion or osteomyelitis although there is no associated fluid collection or soft tissue mass. Correlate with clinical history. 4. Bladder wall thickening, trabeculation, and cellule formation likely representing chronic outlet obstruction change. 5. Mild prostate enlargement. 6. Diffuse vascular calcifications. Electronically Signed   By: Lucienne Capers M.D.   On: 09/28/2018 22:23   Dg Shoulder Left  Result Date: 09/28/2018 CLINICAL DATA:  [place] last night. EXAM: LEFT SHOULDER - 2+ VIEW COMPARISON:  None. FINDINGS: No evidence of fracture or dislocation. Narrowed humeral acromial distance can be seen with acute or chronic rotator cuff tear. IMPRESSION: No acute bone finding. Narrowed humeral acromial distance could indicate rotator cuff tear of unknown chronicity. Electronically Signed   By: Nelson Chimes M.D.   On: 09/28/2018 13:46   Dg Hip Unilat W Or Wo Pelvis 2-3 Views Right  Result Date: 09/28/2018 CLINICAL DATA:  [place] last night.  Pain. EXAM: DG HIP (WITH OR WITHOUT PELVIS) 2-3V RIGHT COMPARISON:  None. FINDINGS: No evidence of pelvic or hip fracture. Regional atherosclerosis and stents. Mild osteoarthritis of both hips.  IMPRESSION: No acute or traumatic finding.  Mild osteoarthritis of the hips. Electronically Signed   By: Nelson Chimes M.D.   On: 09/28/2018 13:48   Vas US Carotid  Result Date: 10/01/2018 Carotid Arterial Duplex Study Indications: Syncope. Performing Technologist: Maudry Mayhew RDMS, RVT, RDCS  Examination Guidelines: A complete evaluation includes B-mode imaging, spectral Doppler, color Doppler, and power Doppler as needed of all accessible portions of each vessel. Bilateral testing is considered an integral part of a complete examination. Limited examinations for reoccurring indications may be performed as noted.  Right Carotid Findings: +----------+-------+-------+--------+---------------------------------+--------+           PSV    EDV    StenosisDescribe                         Comments           cm/s   cm/s                                                     +----------+-------+-------+--------+---------------------------------+--------+ CCA Prox  50     8              irregular and heterogenous                +----------+-------+-------+--------+---------------------------------+--------+ CCA Distal48     9              irregular and heterogenous                +----------+-------+-------+--------+---------------------------------+--------+ ICA Prox  63     14             irregular, heterogenous and                                               calcific                                  +----------+-------+-------+--------+---------------------------------+--------+ ICA Distal64     19                                                       +----------+-------+-------+--------+---------------------------------+--------+ ECA       224    18             calcific, irregular and                                                   heterogenous                               +----------+-------+-------+--------+---------------------------------+--------+ +----------+--------+-------+----------------+-------------------+  PSV cm/sEDV cmsDescribe        Arm Pressure (mmHG) +----------+--------+-------+----------------+-------------------+ [id number]            Multiphasic, WNL                    +----------+--------+-------+----------------+-------------------+ +---------+--------+--+--------+-+---------+ VertebralPSV cm/s30EDV cm/s9Antegrade +---------+--------+--+--------+-+---------+  Left Carotid Findings: +----------+-------+-------+--------+---------------------------------+--------+           PSV    EDV    StenosisDescribe                         Comments           cm/s   cm/s                                                     +----------+-------+-------+--------+---------------------------------+--------+ CCA Prox  55     12                                                       +----------+-------+-------+--------+---------------------------------+--------+ CCA Distal66     13             heterogenous, irregular and                                               calcific                                  +----------+-------+-------+--------+---------------------------------+--------+ ICA Prox  110    22             homogeneous, smooth and calcific          +----------+-------+-------+--------+---------------------------------+--------+ ICA Distal72     23                                                       +----------+-------+-------+--------+---------------------------------+--------+ ECA       182    20             smooth, heterogenous and calcific         +----------+-------+-------+--------+---------------------------------+--------+ +----------+--------+--------+----------------+-------------------+ SubclavianPSV cm/sEDV cm/sDescribe        Arm Pressure (mmHG)  +----------+--------+--------+----------------+-------------------+           145             Multiphasic, WNL                    +----------+--------+--------+----------------+-------------------+ +---------+--------+--+--------+-+---------+ VertebralPSV cm/s30EDV cm/s6Antegrade +---------+--------+--+--------+-+---------+  Summary: Right Carotid: Velocities in the right ICA are consistent with a 1-39% stenosis. Left Carotid: Velocities in the left ICA are consistent with a 1-39% stenosis. Vertebrals:  Bilateral vertebral arteries demonstrate antegrade flow. Subclavians: Normal flow hemodynamics were seen in bilateral subclavian              arteries. *See table(s) above for measurements and  observations.  Electronically signed by Harold Barban MD on 10/01/2018 at 5:32:51 PM.    Final        Subjective: Patient feeling better, no confusion or agitation, continue to very weak, no nausea or vomiting.   Discharge Exam: Vitals:   10/03/18 0937 10/03/18 1147  BP: (!) 132/99 (!) 115/93  Pulse: 81 74  Resp:  20  Temp:  (!) 97.4 F (36.3 C)  SpO2: 94% 98%   Vitals:   10/02/18 1940 10/03/18 0532 10/03/18 0937 10/03/18 1147  BP: 97/71 128/81 (!) 132/99 (!) 115/93  Pulse: 71 70 81 74  Resp: 20 20  20   Temp: 98 F (36.7 C) (!) 97.4 F (36.3 C)  (!) 97.4 F (36.3 C)  TempSrc: Oral Oral  Oral  SpO2: 97% 98% 94% 98%  Weight:  78 kg    Height:       General: Not in pain or dyspnea, deconditioned  Neurology: Awake and alert, non focal  E ENT: no pallor, no icterus, oral mucosa moist Cardiovascular: No JVD. S1-S2 present, rhythmic, no gallops, rubs, or murmurs. No lower extremity edema. Pulmonary: vesicular breath sounds bilaterally, adequate air movement, no wheezing, rhonchi or rales. Gastrointestinal. Abdomen with no organomegaly, non tender, no rebound or guarding Skin. No rashes Musculoskeletal: hypertrophic distal MCP joints bilateral.    The results of significant  diagnostics from this hospitalization (including imaging, microbiology, ancillary and laboratory) are listed below for reference.     Microbiology: No results found for this or any previous visit (from the past 240 hour(s)).   Labs: BNP (last 3 results) Recent Labs    10/14/17 1845  BNP 440.1*   Basic Metabolic Panel: Recent Labs  Lab 09/28/18 1257 09/29/18 0941  NA 132* 130*  K 3.7 3.4*  CL 97* 99  CO2 24 22  GLUCOSE 132* 152*  BUN 14 13  CREATININE 1.24 0.95  CALCIUM 9.3 8.4*   Liver Function Tests: Recent Labs  Lab 09/28/18 1257  AST 42*  ALT 51*  ALKPHOS 135*  BILITOT 1.0  PROT 6.9  ALBUMIN 3.7   No results for input(s): LIPASE, AMYLASE in the last 168 hours. Recent Labs  Lab 09/28/18 1751  AMMONIA <9*   CBC: Recent Labs  Lab 09/28/18 1257 09/29/18 0941  WBC 8.4 6.1  NEUTROABS 6.3  --   HGB 14.2 11.6*  HCT 42.4 35.4*  MCV 86.7 87.4  PLT 48* 50*   Cardiac Enzymes: Recent Labs  Lab 09/28/18 1257 09/28/18 2118 09/29/18 0220 09/29/18 0941  CKTOTAL 82  --   --   --   TROPONINI  --  <0.03 <0.03 <0.03   BNP: Invalid input(s): POCBNP CBG: Recent Labs  Lab 09/30/18 1151 09/30/18 1734 10/01/18 0741 10/02/18 0740 10/03/18 0810  GLUCAP 124* 100* 86 82 73   D-Dimer No results for input(s): DDIMER in the last 72 hours. Hgb A1c No results for input(s): HGBA1C in the last 72 hours. Lipid Profile No results for input(s): CHOL, HDL, LDLCALC, TRIG, CHOLHDL, LDLDIRECT in the last 72 hours. Thyroid function studies No results for input(s): TSH, T4TOTAL, T3FREE, THYROIDAB in the last 72 hours.  Invalid input(s): FREET3 Anemia work up No results for input(s): VITAMINB12, FOLATE, FERRITIN, TIBC, IRON, RETICCTPCT in the last 72 hours. Urinalysis    Component Value Date/Time   COLORURINE AMBER (A) 09/28/2018 1251   APPEARANCEUR HAZY (A) 09/28/2018 1251   LABSPEC 1.021 09/28/2018 1251   PHURINE 6.0 09/28/2018 1251   GLUCOSEU NEGATIVE  09/28/2018 1251   HGBUR LARGE (A) 09/28/2018 1251   BILIRUBINUR NEGATIVE 09/28/2018 1251   KETONESUR NEGATIVE 09/28/2018 1251   PROTEINUR 30 (A) 09/28/2018 1251   UROBILINOGEN 1.0 07/29/2009 0002   NITRITE NEGATIVE 09/28/2018 1251   LEUKOCYTESUR TRACE (A) 09/28/2018 1251   Sepsis Labs Invalid input(s): PROCALCITONIN,  WBC,  LACTICIDVEN Microbiology No results found for this or any previous visit (from the past 240 hour(s)).   Time coordinating discharge: 45 minutes  SIGNED:   Tawni Millers, MD  [facility] 10/03/2018, 2:13 PM Pager [phone]  If 7PM-7AM, please contact night-coverage [email or web address] Password [email or web address]

## 2018-10-03 NOTE — Clinical Social Work Note (Signed)
CSW facilitated patient discharge including contacting patient family and facility to confirm patient discharge plans. Clinical information faxed to facility and family agreeable with plan. CSW arranged ambulance transport via [facility] to [place] at 4:00. RN to call report prior to discharge [phone] Room 113B).  CSW will sign off for now as social work intervention is no longer needed. Please consult [place] again if new needs arise.  [place]ton Scrape, [place]

## 2018-10-03 NOTE — Care Management Important Message (Signed)
Important Message  Patient Details  Name: Vincent Dunlap MRN: [id number] Date of Birth: 11-20-1940   Medicare Important Message Given:  Yes    Erenest Rasher, RN 10/03/2018, 3:04 PM

## 2018-10-03 NOTE — Progress Notes (Signed)
PROGRESS NOTE    Vincent Dunlap  LKG:[id number] DOB: Dec 01, 1940 DOA: 09/28/2018 PCP: Hendricks Limes, MD    Brief Narrative:  77 year old male who presented aftersyncopeepisode. He does have significant past medical history coronary arterydisease, advanced COPD, peripheral vascular disease, protein calorie malnutrition,andalcohol abuse.Patient suffered syncope episode, preceded by orthostatic symptoms, felt dizzy, lightheaded after standingfrom a supine position. He does have ambulatory dysfunction,usesa walker. On his initial physical examination blood pressure 153/87, heart rate 90, temperature 97.8, respiratory rate18, oxygen saturation 98%.Dry mucous membranes, lungs clear to auscultation bilaterally, heart S1-S2 present rhythmic, abdomen soft nontender, lower extremity edema,neurologically patient was nonfocal.Sodium 132, potassium 3.7, chloride 97, bicarb 24, glucose 132, BUN 14, creatinine 1.24, AST 42, ALT 51, troponinless than 0.03,white count 8.4, hemoglobin 14.2, hematocrit 42.4, platelets 48,also is negative for infection. Drug screen negative. CT head with no acute changes, positive cerebral atrophy, ventriculomegaly and periventricular white matter disease.No acutecervical spine changes. CT of thepelvis negative. EKG rhythm, normal axis, inferior-lateral T wave inversions with ST depressions (V4-V6) (new),positive LVH.  Patient was admitted to the hospital with working diagnosis of orthostatic syncope   Assessment & Plan:   Principal Problem:   Syncope Active Problems:   Thrombocytopenia ([place])   CAD (coronary artery disease), native coronary artery   Chronic systolic CHF (congestive heart failure) (HCC)   COPD GOLD II   Severe protein-calorie malnutrition (HCC)   B12 deficiency   Acute metabolic encephalopathy   Fall   Acute kidney injury ([place])   Alcohol use  1. Orthostatic syncope with metabolic encephalopathy/ head contussion. Continue  physical therapy evaluation, waiting placement at the SNF.  2. Diastolic heart failure. Tolerating well carvedilol and isosorbide.   3. Coronary artery disease. Continue dual antiplatelet therapy with asa and clopidogrel. Continue atorvastatin. No angina.   4. Dehydration with hypokalemia and hyponatremia. Patient tolerating po well, no nausea or vomiting.   5. COPD. No current signs of active exacerbation. Continue as needed bronchodilators and supplemental 02 per [place].   6. Severe calorie protein malnutrition. continue with nutritional supplements.   7. Hx of alcohol abuse. On thiamine and multivitamins. No clinical signs of active withdrawal.   8. Depression. Continue with venlafaxine.  DVT prophylaxis:enoxaparin Code Status:full Family Communication:no family at the bedside Disposition Plan/ discharge barriers:pending placement at SNF   Body mass index is 23.65 kg/m. Malnutrition Type:      Malnutrition Characteristics:      Nutrition Interventions:     RN Pressure Injury Documentation: Pressure Injury 07/17/17 Stage II -  Partial thickness loss of dermis presenting as a shallow open ulcer with a red, pink wound bed without slough. (Active)  07/17/17 0215   Location: Sacrum  Location Orientation: Mid  Staging: Stage II -  Partial thickness loss of dermis presenting as a shallow open ulcer with a red, pink wound bed without slough.  Wound Description (Comments):   Present on Admission: Yes     Pressure Ulcer (Active)      Location: Ankle  Location Orientation: Left  Staging:   Wound Description (Comments):   Present on Admission:      Pressure Injury 07/17/17 Unstageable - Full thickness tissue loss in which the base of the ulcer is covered by slough (yellow, tan, gray, green or brown) and/or eschar (tan, brown or black) in the wound bed. R 3rd toe arterial/venous ulcer that is gangre (Active)  07/17/17 0215   Location: Toe (Comment  which  one)  Location Orientation: Right  Staging: Unstageable -  Full thickness tissue loss in which the base of the ulcer is covered by slough (yellow, tan, gray, green or brown) and/or eschar (tan, brown or black) in the wound bed.  Wound Description (Comments): R 3rd toe arterial/venous ulcer that is gangrenous  Present on Admission: Yes     Pressure Injury 07/18/17 (Active)  07/18/17 0859   Location: Arm  Location Orientation: Right  Staging:   Wound Description (Comments):   Present on Admission:      Consultants:     Procedures:     Antimicrobials:       Subjective: Patient feeling better, no confusion or agitation, continue to very weak, no nausea or vomiting.   Objective: Vitals:   10/02/18 1629 10/02/18 1940 10/03/18 0532 10/03/18 0937  BP: 118/88 97/71 128/81 (!) 132/99  Pulse: 73 71 70 81  Resp: 18 20 20    Temp:  98 F (36.7 C) (!) 97.4 F (36.3 C)   TempSrc:  Oral Oral   SpO2: 98% 97% 98% 94%  Weight:   78 kg   Height:        Intake/Output Summary (Last 24 hours) at 10/03/2018 1022 Last data filed at 10/03/2018 0942 Gross per 24 hour  Intake 946 ml  Output 2125 ml  Net -1179 ml   Filed Weights   10/01/18 0458 10/02/18 0549 10/03/18 0532  Weight: 76.1 kg 81.6 kg 78 kg    Examination:   General: Not in pain or dyspnea, deconditioned  Neurology: Awake and alert, non focal  E ENT: no pallor, no icterus, oral mucosa moist Cardiovascular: No JVD. S1-S2 present, rhythmic, no gallops, rubs, or murmurs. No lower extremity edema. Pulmonary: vesicular breath sounds bilaterally, adequate air movement, no wheezing, rhonchi or rales. Gastrointestinal. Abdomen with no organomegaly, non tender, no rebound or guarding Skin. No rashes Musculoskeletal: hypertrophic distal MCP joints bilateral.      Data Reviewed: I have personally reviewed following labs and imaging studies  CBC: Recent Labs  Lab 09/28/18 1257 09/29/18 0941  WBC 8.4 6.1  NEUTROABS  6.3  --   HGB 14.2 11.6*  HCT 42.4 35.4*  MCV 86.7 87.4  PLT 48* 50*   Basic Metabolic Panel: Recent Labs  Lab 09/28/18 1257 09/29/18 0941  NA 132* 130*  K 3.7 3.4*  CL 97* 99  CO2 24 22  GLUCOSE 132* 152*  BUN 14 13  CREATININE 1.24 0.95  CALCIUM 9.3 8.4*   GFR: Estimated Creatinine Clearance: 70.5 mL/min (by C-G formula based on SCr of 0.95 mg/dL). Liver Function Tests: Recent Labs  Lab 09/28/18 1257  AST 42*  ALT 51*  ALKPHOS 135*  BILITOT 1.0  PROT 6.9  ALBUMIN 3.7   No results for input(s): LIPASE, AMYLASE in the last 168 hours. Recent Labs  Lab 09/28/18 1751  AMMONIA <9*   Coagulation Profile: No results for input(s): INR, PROTIME in the last 168 hours. Cardiac Enzymes: Recent Labs  Lab 09/28/18 1257 09/28/18 2118 09/29/18 0220 09/29/18 0941  CKTOTAL 82  --   --   --   TROPONINI  --  <0.03 <0.03 <0.03   BNP (last 3 results) No results for input(s): PROBNP in the last 8760 hours. HbA1C: No results for input(s): HGBA1C in the last 72 hours. CBG: Recent Labs  Lab 09/30/18 1151 09/30/18 1734 10/01/18 0741 10/02/18 0740 10/03/18 0810  GLUCAP 124* 100* 86 82 73   Lipid Profile: No results for input(s): CHOL, HDL, LDLCALC, TRIG, CHOLHDL, LDLDIRECT in the last 72 hours.  Thyroid Function Tests: No results for input(s): TSH, T4TOTAL, FREET4, T3FREE, THYROIDAB in the last 72 hours. Anemia Panel: No results for input(s): VITAMINB12, FOLATE, FERRITIN, TIBC, IRON, RETICCTPCT in the last 72 hours.    Radiology Studies: I have reviewed all of the imaging during this hospital visit personally     Scheduled Meds: . aspirin  81 mg Oral Daily  . atorvastatin  80 mg Oral q1800  . carvedilol  3.125 mg Oral BID WC  . cholecalciferol  1,500 Units Oral Daily  . clopidogrel  75 mg Oral Q breakfast  . folic acid  1 mg Oral Daily  . gabapentin  200 mg Oral TID  . Influenza vac split quadrivalent PF  0.5 mL Intramuscular Tomorrow-1000  . multivitamin  with minerals  1 tablet Oral Daily  . pantoprazole  40 mg Oral Daily  . polyethylene glycol  17 g Oral BID  . senna-docusate  2 tablet Oral QHS  . sodium chloride flush  3 mL Intravenous Q12H  . thiamine  100 mg Oral Daily  . venlafaxine XR  75 mg Oral Q breakfast  . vitamin B-12  1,000 mcg Oral Daily   Continuous Infusions:   LOS: 4 days        Vincent Karnes Gerome Apley, MD [facility] Pager [phone] '

## 2018-10-03 NOTE — Clinical Social Work Placement (Signed)
   CLINICAL SOCIAL WORK PLACEMENT  NOTE  Date:  10/03/2018  Patient Details  Name: Vincent Dunlap MRN: [id number] Date of Birth: 25-Oct-1941  Clinical Social Work is seeking post-discharge placement for this patient at the [place] level of care (*CSW will initial, date and re-position this form in  chart as items are completed):  Yes   Patient/family provided with [place] Work Department's list of facilities offering this level of care within the geographic area requested by the patient (or if unable, by the patient's family).  Yes   Patient/family informed of their freedom to choose among providers that offer the needed level of care, that participate in Medicare, Medicaid or managed care program needed by the patient, have an available bed and are willing to accept the patient.  Yes   Patient/family informed of [place]'s ownership interest in [facility] and [facility], as well as of the fact that they are under no obligation to receive care at these facilities.  PASRR submitted to EDS on 10/02/18     PASRR number received on       Existing PASRR number confirmed on 10/02/18     FL2 transmitted to all facilities in geographic area requested by pt/family on 10/02/18     FL2 transmitted to all facilities within larger geographic area on       Patient informed that his/her managed care company has contracts with or will negotiate with certain facilities, including the following:        Yes   Patient/family informed of bed offers received.  Patient chooses bed at [facility])     Physician recommends and patient chooses bed at      Patient to be transferred to [facility]) on 10/03/18.  Patient to be transferred to facility by [facility]     Patient family notified on 10/03/18 of transfer.  Name of family member notified:        PHYSICIAN Please prepare prescriptions     Additional Comment:     _______________________________________________ Candie Chroman, LCSW 10/03/2018, 3:12 PM

## 2018-10-03 NOTE — Progress Notes (Signed)
patient stated that he had a flu vaccine three months ago at [facility] upon asking if he wanted to take his flu vaccine at this time.

## 2018-10-03 NOTE — Clinical Social Work Note (Addendum)
Provided patient with list of bed offers. He will make some phone calls to get input on which one he should choose. CSW will follow up later for decision.  Dayton Scrape, [place] [phone]  12:28 pm Patient has not made facility decision yet because he is not familiar with them. CSW provided more detail on locations. He said out of the four options, he has only heard of [place]. Their clinical liaison will be by in about an hour to provide information on the facility and answer questions.  Dayton Scrape, CSW [phone]  1:32 pm [facility] has spoken with patient. Patient apparently plans to stay there long-term. Facility is agreeable. Liaison will go ahead and complete paperwork with patient.  Dayton Scrape, [place]  1:58 pm [facility] SNF has a bed for patient today if stable. Sent message to MD to notify. Liaison spoke with patient's emergency contact, [name].  Dayton Scrape, [employer]

## 2018-10-04 ENCOUNTER — Telehealth: Payer: Self-pay

## 2018-10-04 NOTE — Telephone Encounter (Signed)
Possible re-admission to facility. This is a patient you were seeing at [facility]. [facility] F/U is needed if patient was re-admitted to facility upon discharge. Hospital discharge from 11/26 from [facility]

## 2019-11-04 IMAGING — CT CT CHEST W/ CM
2 of 4 series · 13 of 36 positions shown, 16 images · IV contrast (APPLIED)
Comparison: Chest CT March 01, 2007; chest radiograph October 14, 2017

CLINICAL DATA: Chronic chest pain with hypotension.  Cough.

EXAM:
CT CHEST WITH CONTRAST
TECHNIQUE: Multidetector CT imaging of the chest was performed during
intravenous contrast administration.
CONTRAST:  75 mL [id number] IOPAMIDOL ([id number]) INJECTION 61%

[Series 3: chest wo · axial · 0.76mm/px · z∈[+964,+1224]mm · 10 of 154 slices shown, 13 images]
[im 12/154  mediastinal]
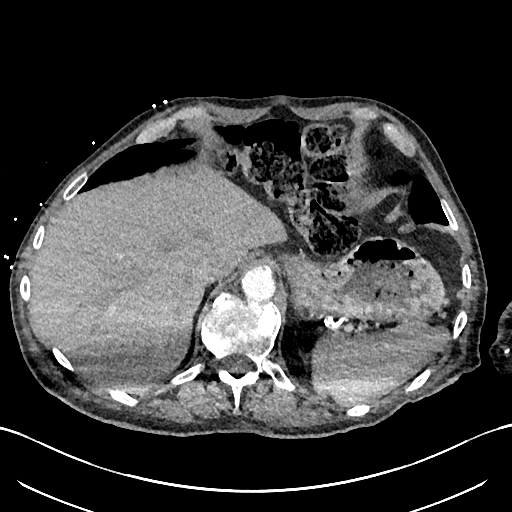
[im 12/154  lung]
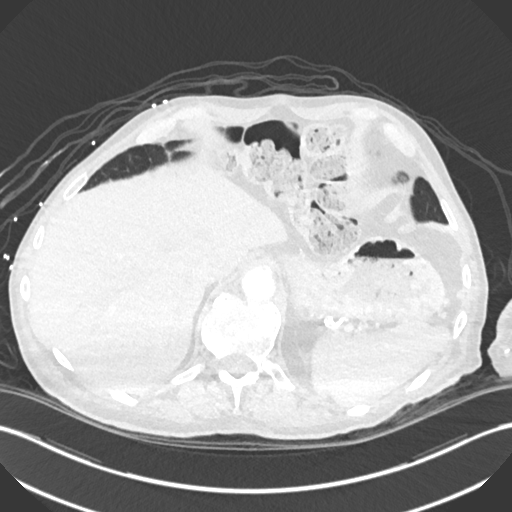
[im 24/154  lung]
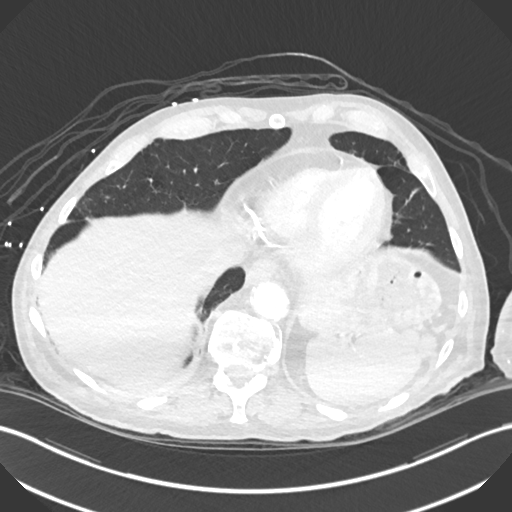
[im 48/154  lung]
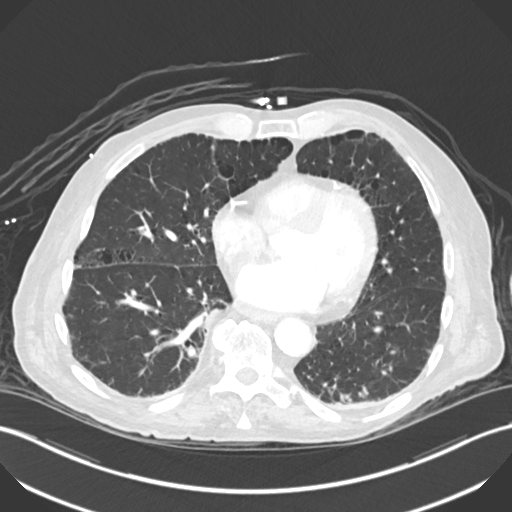
[im 59/154  lung]
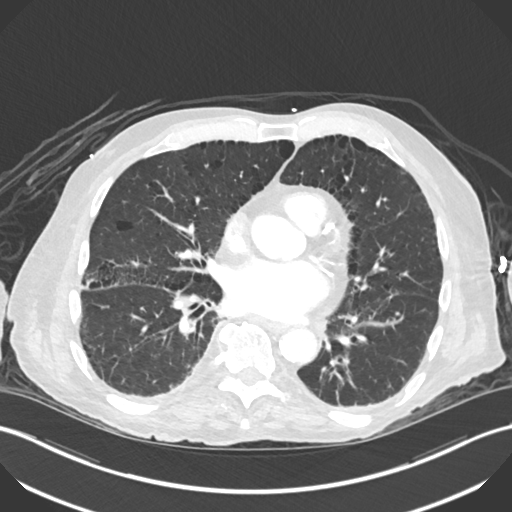
[im 71/154  mediastinal]
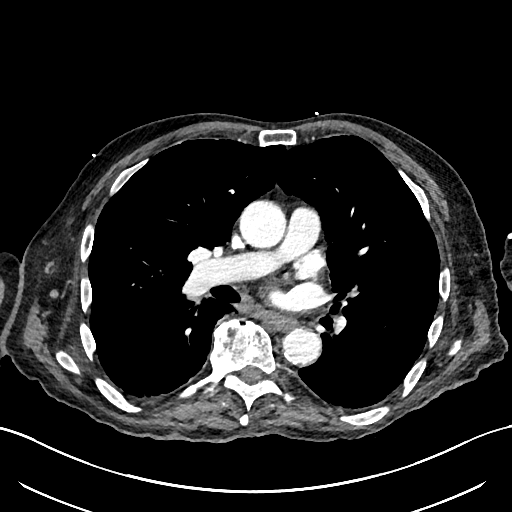
[im 71/154  lung]
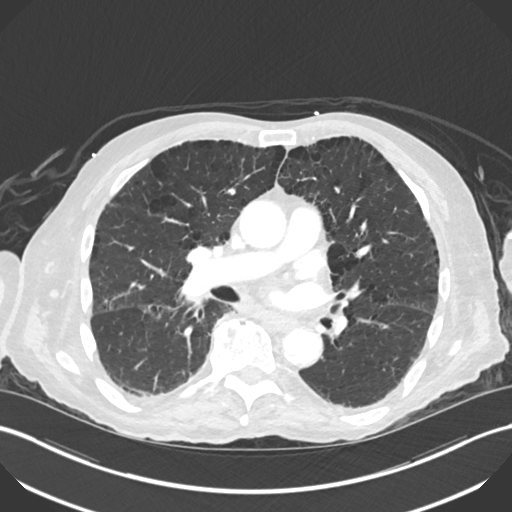
[im 83/154  lung]
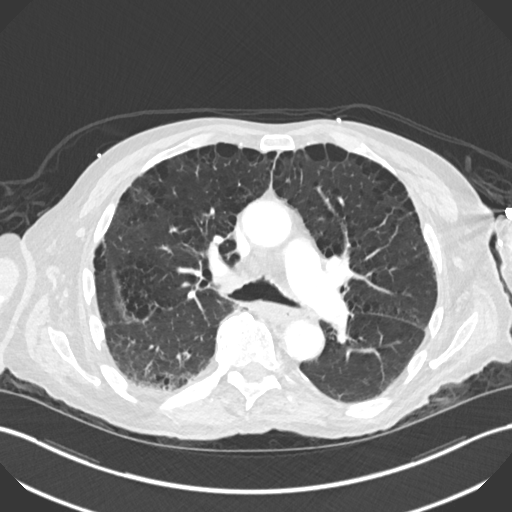
[im 95/154  lung]
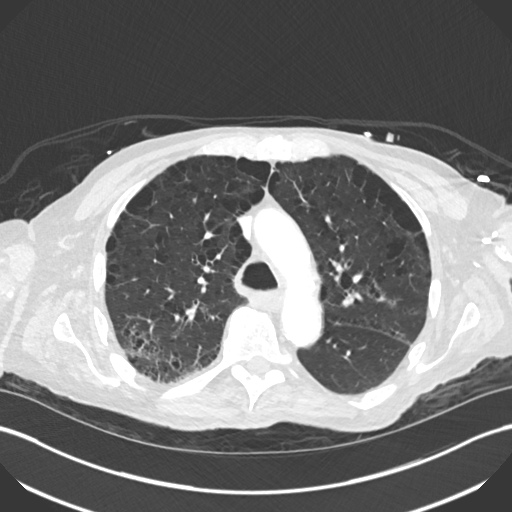
[im 118/154  lung]
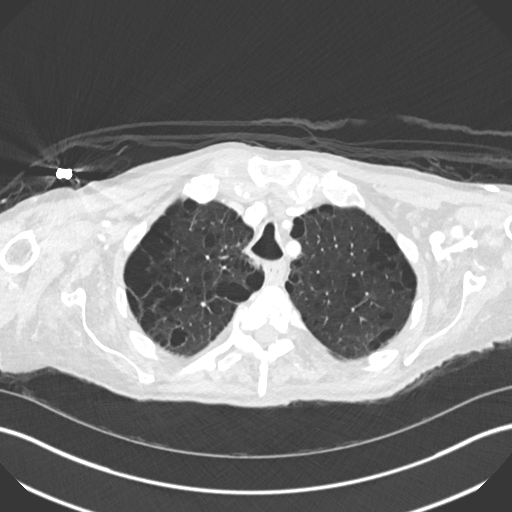
[im 130/154  mediastinal]
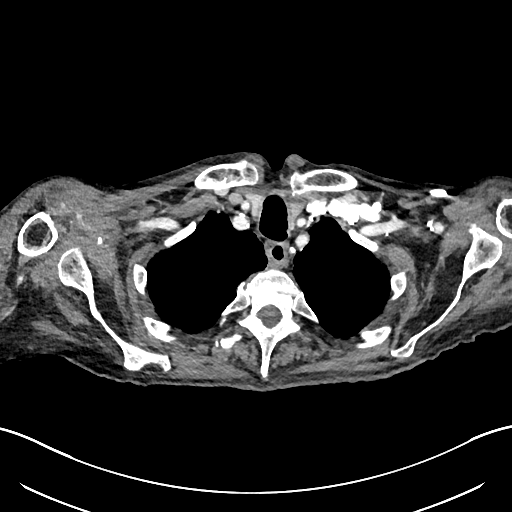
[im 130/154  lung]
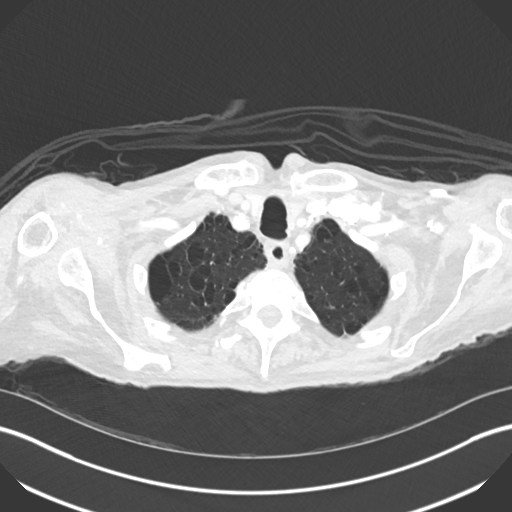
[im 142/154  lung]
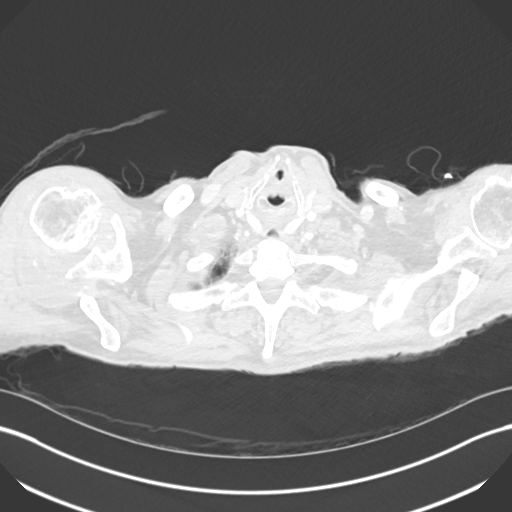

[Series 6: cor · coronal · 0.61mm/px · 3 of 151 slices shown]
[im 31/151  lung]
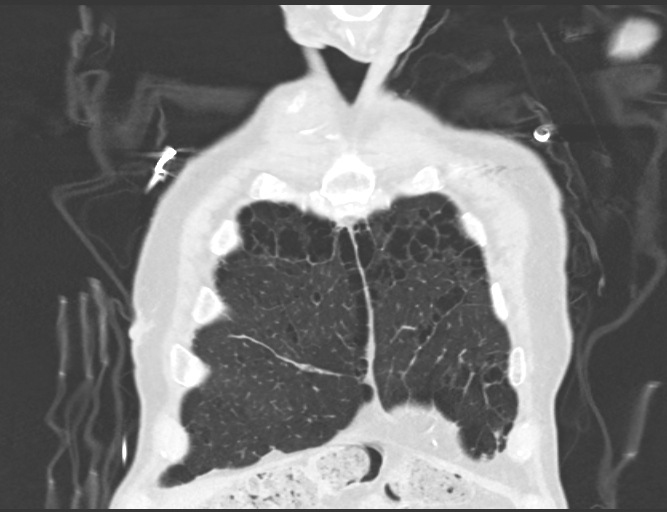
[im 61/151  lung]
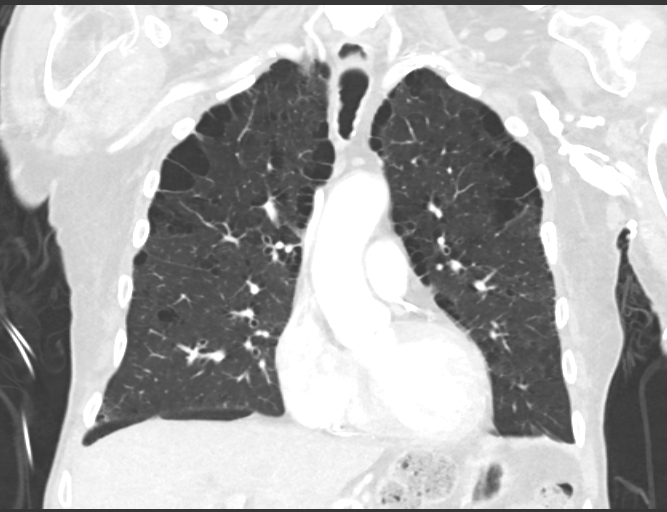
[im 91/151  lung]
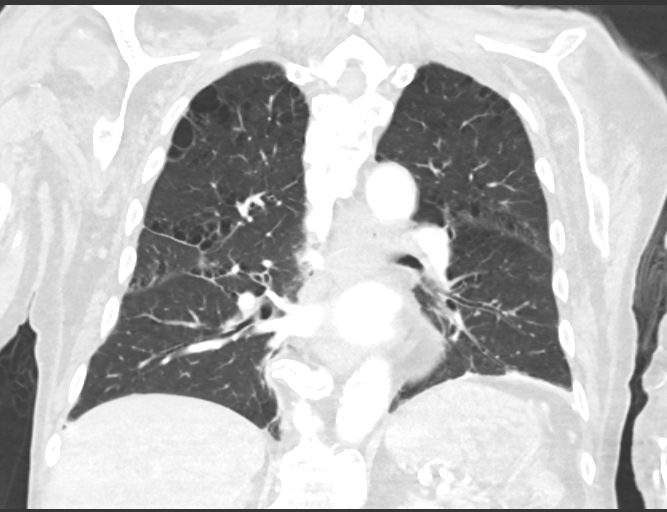

[13 of 36 positions shown; findings below may reference images not displayed]

FINDINGS: Cardiovascular: There is no thoracic aortic aneurysm or dissection.
There are scattered foci of calcification in the visualized great
vessels, most notably in the proximal left subclavian artery. There
are foci of calcification in the thoracic aorta. There are multiple
foci of coronary artery calcification. There is no appreciable
pericardial effusion or thickening. There is no major vessel
pulmonary embolus.

Mediastinum/Nodes: Thyroid appears normal. There are scattered
subcentimeter axillary and mediastinal lymph nodes. There is a lymph
node in the right hilum measuring 1.4 x 1.3 cm. There is a sub-
carinal lymph node measuring 1.8 x 1.1 cm. No esophageal lesions are
appreciable.

Lungs/Pleura: There is extensive underlying centrilobular
emphysematous change. There are multiple bullae throughout the upper
lobes bilaterally. There is interstitial fibrotic change in the
posterior segment of the right upper lobe as well as to a lesser
degree in the posterior segment of the left upper lobe. There is
upper lobe bronchiectatic change bilaterally, more pronounced on the
right than on the left. There is fibrosis in portions of the
superior and lateral segments of the right lower lobe. There is
lower lobe bronchiectatic change bilaterally, more severe on the
right than on the left. There is bibasilar atelectatic change. There
is mild scarring in the right middle lobe and lingular regions as
well as in both lower lobes more centrally.

On axial slice 91 series 4, there is a 5 mm nodular lesion in the
superior segment of the left lower lobe.

There is no edema or consolidation. There is no appreciable pleural
effusion.

Upper Abdomen: Visualized upper abdominal structures appear normal
except for calcification in the aorta and proximal major mesenteric
arterial vessels. There is marked calcification at the celiac axis
without frank occlusion of this vessel.

Musculoskeletal: There is degenerative change in the thoracic spine
with diffuse idiopathic skeletal hyperostosis.
IMPRESSION: 1. Extensive underlying emphysematous change with bullae in both
upper lobes, more on the right than on the left. There is
bronchiectatic change bilaterally, more severe on the right than on
the left. Areas of fibrosis noted in the posterior segments of both
upper lobes, more on the right than on the left as well as in
portions of the right lower lobe.

2.  Bibasilar atelectasis.  No edema or consolidation.

3. 5 mm nodular opacity left lower lobe. No follow-up needed if
patient is low-risk. Non-contrast chest CT can be considered in 12
months if patient is high-risk. This recommendation follows the
consensus statement: Guidelines for Management of Incidental
Pulmonary Nodules Detected on CT Images: From the [HOSPITAL]

4. Mildly prominent right hilar and sub- carinal lymph nodes.
Suspect reactive etiology given the changes in the lungs.

5. Aortic and major mesenteric/great vessel atherosclerotic
calcification. There are foci of coronary artery calcification.

Aortic Atherosclerosis (FD1HJ-JJH.H) and Emphysema (FD1HJ-1RI.I).

## 2019-11-25 ENCOUNTER — Other Ambulatory Visit: Payer: Self-pay

## 2019-11-25 ENCOUNTER — Encounter (HOSPITAL_COMMUNITY): Payer: Self-pay | Admitting: Emergency Medicine

## 2019-11-25 ENCOUNTER — Emergency Department (HOSPITAL_COMMUNITY): Payer: Medicare (Managed Care)

## 2019-11-25 ENCOUNTER — Inpatient Hospital Stay (HOSPITAL_COMMUNITY)
Admission: EM | Admit: 2019-11-25 | Discharge: 2019-11-30 | DRG: 871 | Disposition: A | Discharge status: Skilled Nursing Facility | Payer: Medicare (Managed Care) | Source: Skilled Nursing Facility | Attending: Family Medicine | Admitting: Family Medicine

## 2019-11-25 DIAGNOSIS — E861 Hypovolemia: Secondary | ICD-10-CM

## 2019-11-25 DIAGNOSIS — Z87448 Personal history of other diseases of urinary system: Secondary | ICD-10-CM

## 2019-11-25 DIAGNOSIS — C799 Secondary malignant neoplasm of unspecified site: Secondary | ICD-10-CM

## 2019-11-25 DIAGNOSIS — K921 Melena: Secondary | ICD-10-CM | POA: Diagnosis present

## 2019-11-25 DIAGNOSIS — R531 Weakness: Secondary | ICD-10-CM

## 2019-11-25 DIAGNOSIS — D72829 Elevated white blood cell count, unspecified: Secondary | ICD-10-CM | POA: Diagnosis not present

## 2019-11-25 DIAGNOSIS — I21A1 Myocardial infarction type 2: Secondary | ICD-10-CM | POA: Diagnosis present

## 2019-11-25 DIAGNOSIS — Z993 Dependence on wheelchair: Secondary | ICD-10-CM

## 2019-11-25 DIAGNOSIS — R52 Pain, unspecified: Secondary | ICD-10-CM | POA: Diagnosis not present

## 2019-11-25 DIAGNOSIS — R778 Other specified abnormalities of plasma proteins: Secondary | ICD-10-CM | POA: Diagnosis present

## 2019-11-25 DIAGNOSIS — N179 Acute kidney failure, unspecified: Secondary | ICD-10-CM | POA: Diagnosis present

## 2019-11-25 DIAGNOSIS — J1282 Pneumonia due to coronavirus disease 2019: Secondary | ICD-10-CM | POA: Diagnosis present

## 2019-11-25 DIAGNOSIS — Z7982 Long term (current) use of aspirin: Secondary | ICD-10-CM

## 2019-11-25 DIAGNOSIS — K922 Gastrointestinal hemorrhage, unspecified: Secondary | ICD-10-CM | POA: Diagnosis present

## 2019-11-25 DIAGNOSIS — Z79899 Other long term (current) drug therapy: Secondary | ICD-10-CM

## 2019-11-25 DIAGNOSIS — I110 Hypertensive heart disease with heart failure: Secondary | ICD-10-CM | POA: Diagnosis present

## 2019-11-25 DIAGNOSIS — M8448XA Pathological fracture, other site, initial encounter for fracture: Secondary | ICD-10-CM | POA: Diagnosis present

## 2019-11-25 DIAGNOSIS — G9520 Unspecified cord compression: Secondary | ICD-10-CM | POA: Diagnosis not present

## 2019-11-25 DIAGNOSIS — G894 Chronic pain syndrome: Secondary | ICD-10-CM | POA: Diagnosis present

## 2019-11-25 DIAGNOSIS — G9341 Metabolic encephalopathy: Secondary | ICD-10-CM | POA: Diagnosis present

## 2019-11-25 DIAGNOSIS — D62 Acute posthemorrhagic anemia: Secondary | ICD-10-CM

## 2019-11-25 DIAGNOSIS — C7951 Secondary malignant neoplasm of bone: Secondary | ICD-10-CM | POA: Diagnosis not present

## 2019-11-25 DIAGNOSIS — K5150 Left sided colitis without complications: Secondary | ICD-10-CM | POA: Diagnosis present

## 2019-11-25 DIAGNOSIS — Z87891 Personal history of nicotine dependence: Secondary | ICD-10-CM

## 2019-11-25 DIAGNOSIS — I255 Ischemic cardiomyopathy: Secondary | ICD-10-CM | POA: Diagnosis present

## 2019-11-25 DIAGNOSIS — D500 Iron deficiency anemia secondary to blood loss (chronic): Secondary | ICD-10-CM | POA: Diagnosis not present

## 2019-11-25 DIAGNOSIS — R791 Abnormal coagulation profile: Secondary | ICD-10-CM | POA: Diagnosis not present

## 2019-11-25 DIAGNOSIS — R6520 Severe sepsis without septic shock: Secondary | ICD-10-CM | POA: Diagnosis not present

## 2019-11-25 DIAGNOSIS — I10 Essential (primary) hypertension: Secondary | ICD-10-CM | POA: Diagnosis present

## 2019-11-25 DIAGNOSIS — A4189 Other specified sepsis: Principal | ICD-10-CM | POA: Diagnosis present

## 2019-11-25 DIAGNOSIS — M8458XA Pathological fracture in neoplastic disease, other specified site, initial encounter for fracture: Secondary | ICD-10-CM

## 2019-11-25 DIAGNOSIS — Z833 Family history of diabetes mellitus: Secondary | ICD-10-CM

## 2019-11-25 DIAGNOSIS — Z820 Family history of epilepsy and other diseases of the nervous system: Secondary | ICD-10-CM

## 2019-11-25 DIAGNOSIS — Z515 Encounter for palliative care: Secondary | ICD-10-CM | POA: Diagnosis not present

## 2019-11-25 DIAGNOSIS — I25118 Atherosclerotic heart disease of native coronary artery with other forms of angina pectoris: Secondary | ICD-10-CM | POA: Diagnosis not present

## 2019-11-25 DIAGNOSIS — G6289 Other specified polyneuropathies: Secondary | ICD-10-CM | POA: Diagnosis present

## 2019-11-25 DIAGNOSIS — J449 Chronic obstructive pulmonary disease, unspecified: Secondary | ICD-10-CM | POA: Diagnosis not present

## 2019-11-25 DIAGNOSIS — H9190 Unspecified hearing loss, unspecified ear: Secondary | ICD-10-CM | POA: Diagnosis present

## 2019-11-25 DIAGNOSIS — I219 Acute myocardial infarction, unspecified: Secondary | ICD-10-CM | POA: Diagnosis not present

## 2019-11-25 DIAGNOSIS — D509 Iron deficiency anemia, unspecified: Secondary | ICD-10-CM | POA: Diagnosis present

## 2019-11-25 DIAGNOSIS — R6510 Systemic inflammatory response syndrome (SIRS) of non-infectious origin without acute organ dysfunction: Secondary | ICD-10-CM

## 2019-11-25 DIAGNOSIS — I9589 Other hypotension: Secondary | ICD-10-CM | POA: Diagnosis not present

## 2019-11-25 DIAGNOSIS — E785 Hyperlipidemia, unspecified: Secondary | ICD-10-CM | POA: Diagnosis present

## 2019-11-25 DIAGNOSIS — M899 Disorder of bone, unspecified: Secondary | ICD-10-CM | POA: Diagnosis not present

## 2019-11-25 DIAGNOSIS — U071 COVID-19: Secondary | ICD-10-CM

## 2019-11-25 DIAGNOSIS — Z66 Do not resuscitate: Secondary | ICD-10-CM | POA: Diagnosis present

## 2019-11-25 DIAGNOSIS — I2510 Atherosclerotic heart disease of native coronary artery without angina pectoris: Secondary | ICD-10-CM | POA: Diagnosis present

## 2019-11-25 DIAGNOSIS — J440 Chronic obstructive pulmonary disease with acute lower respiratory infection: Secondary | ICD-10-CM | POA: Diagnosis present

## 2019-11-25 DIAGNOSIS — A419 Sepsis, unspecified organism: Secondary | ICD-10-CM | POA: Diagnosis present

## 2019-11-25 DIAGNOSIS — I5022 Chronic systolic (congestive) heart failure: Secondary | ICD-10-CM | POA: Diagnosis present

## 2019-11-25 DIAGNOSIS — E538 Deficiency of other specified B group vitamins: Secondary | ICD-10-CM | POA: Diagnosis present

## 2019-11-25 DIAGNOSIS — E222 Syndrome of inappropriate secretion of antidiuretic hormone: Secondary | ICD-10-CM | POA: Diagnosis present

## 2019-11-25 DIAGNOSIS — Z89421 Acquired absence of other right toe(s): Secondary | ICD-10-CM

## 2019-11-25 DIAGNOSIS — I472 Ventricular tachycardia: Secondary | ICD-10-CM | POA: Diagnosis not present

## 2019-11-25 DIAGNOSIS — D649 Anemia, unspecified: Secondary | ICD-10-CM

## 2019-11-25 DIAGNOSIS — I739 Peripheral vascular disease, unspecified: Secondary | ICD-10-CM | POA: Diagnosis present

## 2019-11-25 DIAGNOSIS — I959 Hypotension, unspecified: Secondary | ICD-10-CM | POA: Diagnosis present

## 2019-11-25 DIAGNOSIS — F0390 Unspecified dementia without behavioral disturbance: Secondary | ICD-10-CM | POA: Diagnosis present

## 2019-11-25 DIAGNOSIS — Z888 Allergy status to other drugs, medicaments and biological substances status: Secondary | ICD-10-CM

## 2019-11-25 DIAGNOSIS — R7989 Other specified abnormal findings of blood chemistry: Secondary | ICD-10-CM | POA: Diagnosis present

## 2019-11-25 HISTORY — DX: Hyperlipidemia, unspecified: E78.5

## 2019-11-25 HISTORY — DX: Chronic systolic (congestive) heart failure: I50.22

## 2019-11-25 LAB — COMPREHENSIVE METABOLIC PANEL
ALT: 10 U/L (ref 0–44)
AST: 18 U/L (ref 15–41)
Albumin: 2.4 g/dL — ABNORMAL LOW (ref 3.5–5.0)
Alkaline Phosphatase: 102 U/L (ref 38–126)
Anion gap: 14 (ref 5–15)
BUN: 91 mg/dL — ABNORMAL HIGH (ref 8–23)
CO2: 16 mmol/L — ABNORMAL LOW (ref 22–32)
Calcium: 9 mg/dL (ref 8.9–10.3)
Chloride: 108 mmol/L (ref 98–111)
Creatinine, Ser: 1.15 mg/dL (ref 0.61–1.24)
GFR calc Af Amer: >60 mL/min (ref >60)
GFR calc non Af Amer: >60 mL/min (ref >60)
Glucose, Bld: 126 mg/dL — ABNORMAL HIGH (ref 70–99)
Potassium: 4.4 mmol/L (ref 3.5–5.1)
Sodium: 138 mmol/L (ref 135–145)
Total Bilirubin: 0.7 mg/dL (ref 0.3–1.2)
Total Protein: 5.4 g/dL — ABNORMAL LOW (ref 6.5–8.1)

## 2019-11-25 LAB — LACTIC ACID, PLASMA
Lactic Acid, Venous: 4.4 mmol/L (ref 0.5–1.9)
Lactic Acid, Venous: 2.9 mmol/L (ref 0.5–1.9)

## 2019-11-25 LAB — RESPIRATORY PANEL BY RT PCR (FLU A&B, COVID)
Influenza A by PCR: NEGATIVE
Influenza B by PCR: NEGATIVE
SARS Coronavirus 2 by RT PCR: POSITIVE — AB

## 2019-11-25 LAB — TROPONIN I (HIGH SENSITIVITY)
Troponin I (High Sensitivity): 134 ng/L (ref <18)
Troponin I (High Sensitivity): 136 ng/L (ref <18)

## 2019-11-25 LAB — PROTIME-INR
INR: 2.1 — ABNORMAL HIGH (ref 0.8–1.2)
Prothrombin Time: 23.6 seconds — ABNORMAL HIGH (ref 11.4–15.2)

## 2019-11-25 LAB — CBC
HCT: 20.4 % — ABNORMAL LOW (ref 39.0–52.0)
Hemoglobin: 6.3 g/dL — CL (ref 13.0–17.0)
MCH: 27.6 pg (ref 26.0–34.0)
MCHC: 30.9 g/dL (ref 30.0–36.0)
MCV: 89.5 fL (ref 80.0–100.0)
Platelets: 338 10*3/uL (ref 150–400)
RBC: 2.28 MIL/uL — ABNORMAL LOW (ref 4.22–5.81)
RDW: 24.6 % — ABNORMAL HIGH (ref 11.5–15.5)
WBC: 28.3 10*3/uL — ABNORMAL HIGH (ref 4.0–10.5)
nRBC: 0.2 % (ref 0.0–0.2)

## 2019-11-25 LAB — PREPARE RBC (CROSSMATCH)

## 2019-11-25 LAB — POC OCCULT BLOOD, ED: Fecal Occult Bld: POSITIVE — AB

## 2019-11-25 LAB — CBG MONITORING, ED: Glucose-Capillary: 116 mg/dL — ABNORMAL HIGH (ref 70–99)

## 2019-11-25 MED ORDER — SODIUM CHLORIDE 0.9 % IV SOLN
2.0000 g | Freq: Three times a day (TID) | INTRAVENOUS | Status: DC
  Administered 2019-11-26 – 2019-11-28 (×7): 2 g via INTRAVENOUS
  Filled 2019-11-25 (×10): qty 2

## 2019-11-25 MED ORDER — SODIUM CHLORIDE 0.9 % IV BOLUS
1000.0000 mL | Freq: Once | INTRAVENOUS | Status: AC
  Administered 2019-11-25: 1000 mL via INTRAVENOUS

## 2019-11-25 MED ORDER — SODIUM CHLORIDE 0.9% FLUSH: 3.0000 mL | Freq: Once | INTRAVENOUS | Status: DC

## 2019-11-25 MED ORDER — SODIUM CHLORIDE 0.9 % IV SOLN
10.0000 mL/h | Freq: Once | INTRAVENOUS | Status: AC
  Administered 2019-11-25: 23:00:00 10 mL/h via INTRAVENOUS

## 2019-11-25 MED ORDER — METRONIDAZOLE IN NACL 5-0.79 MG/ML-% IV SOLN
500.0000 mg | Freq: Three times a day (TID) | INTRAVENOUS | Status: DC
  Administered 2019-11-26 – 2019-11-28 (×7): 500 mg via INTRAVENOUS
  Filled 2019-11-25 (×8): qty 100

## 2019-11-25 MED ORDER — SODIUM CHLORIDE 0.9 % IV SOLN
2.0000 g | Freq: Once | INTRAVENOUS | Status: AC
  Administered 2019-11-26: 2 g via INTRAVENOUS
  Filled 2019-11-25: qty 2

## 2019-11-25 MED ORDER — VANCOMYCIN HCL 1500 MG/300ML IV SOLN
1500.0000 mg | Freq: Once | INTRAVENOUS | Status: AC
  Administered 2019-11-26: 1500 mg via INTRAVENOUS
  Filled 2019-11-25: qty 300

## 2019-11-25 MED ORDER — PANTOPRAZOLE SODIUM 40 MG IV SOLR
40.0000 mg | Freq: Once | INTRAVENOUS | Status: AC
  Administered 2019-11-25: 22:00:00 40 mg via INTRAVENOUS
  Filled 2019-11-25: qty 40

## 2019-11-25 MED ORDER — VANCOMYCIN HCL 1500 MG/300ML IV SOLN
1500.0000 mg | INTRAVENOUS | Status: DC
  Administered 2019-11-26 – 2019-11-27 (×2): 1500 mg via INTRAVENOUS
  Filled 2019-11-25 (×3): qty 300

## 2019-11-25 NOTE — ED Provider Notes (Signed)
[facility] EMERGENCY DEPARTMENT Provider Note   CSN: [id number] Arrival date & time: 11/25/19  2035     History Chief Complaint  Patient presents with  . Altered Mental Status  . Hypotension    Vincent Dunlap is a 79 y.o. male.  He is a resident at [facility].  Reportedly tonight he was unable to carry on a conversation with staff.  EMS was called and his blood pressure was 60 palpated on arrival.  They gave him some IV fluids with improvement in his mental status and blood pressure.  Currently the patient denies any complaints.  No chest pain shortness of breath abdominal pain headache.  He says he does not walk because his legs are weak and do not hold him up.  Denies any recent falls.  The history is provided by the patient and the EMS personnel.  Altered Mental Status Presenting symptoms: partial responsiveness   Severity:  Moderate Most recent episode:  Today Episode history:  Single Timing:  Unable to specify Progression:  Resolved Chronicity:  New Context: nursing home resident   Context: not head injury and not recent illness   Associated symptoms: no abdominal pain, no difficulty breathing, no fever, no headaches, no nausea, no rash, no seizures, no visual change and no vomiting        Past Medical History:  Diagnosis Date  . CAD (coronary artery disease) 07/2016   3 vessel disease,  evaluated by Dr Servando Snare and felt to be a poor surgical candidate  . CHF (congestive heart failure) ([place])   . Hypertension   . Ischemic cardiomyopathy 2017   EF 30%  . Tobacco abuse     Patient Active Problem List   Diagnosis Date Noted  . Encephalopathy   . Acute metabolic encephalopathy 78/29/5621  . Fall 09/28/2018  . Acute kidney injury ([place]) 09/28/2018  . Syncope 09/28/2018  . Alcohol use 09/28/2018  . 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 PAD (peripheral artery disease) ([place]) 04/20/2018  . B12 deficiency 02/28/2018  . Upper airway cough  syndrome 12/31/2017  . Solitary pulmonary nodule on lung CT 12/31/2017  . Bronchiectasis without complication ([place]) 30/86/5784  . Adult failure to thrive 10/25/2017  . Microcytic anemia 10/25/2017  . Abnormal CT of the chest 10/25/2017  . Peripheral neuropathy 10/25/2017  . Hyponatremia 10/15/2017  . Severe protein-calorie malnutrition ([place])   . UTI (urinary tract infection) 07/17/2017  . Sepsis ([place]) 07/17/2017  . Hypotension 07/17/2017  . Chest pain 07/17/2017  . Osteomyelitis of toe of right foot ([place]) 07/17/2017  . Pressure injury of skin 07/17/2017  . Acute coronary syndrome ([place]) 07/22/2016  . COPD GOLD II   . 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 Chronic systolic CHF (congestive heart failure) ([place])   . Chronic pain syndrome   . Pressure ulcer 07/18/2016  . Acute respiratory failure with hypoxia ([place]) 07/17/2016  . Essential hypertension 07/17/2016  . Dizziness 07/17/2016  . Congestive heart failure ([place])   . CAD (coronary artery disease), native coronary artery   . NSTEMI (non-ST elevated myocardial infarction) ([place])   . At risk for falling 05/20/2014  . Thrombocytopenia ([place]) 08/30/2012    Past Surgical History:  Procedure Laterality Date  . ABDOMINAL AORTOGRAM W/LOWER EXTREMITY Right 07/26/2017   Procedure: ABDOMINAL AORTOGRAM W/LOWER EXTREMITY;  Surgeon: Serafina Mitchell, MD;  Location: [place] [facility];  Service: Cardiovascular;  Laterality: Right;  . AMPUTATION Right 07/28/2017   Procedure: AMPUTATION RIGHT Second TOE;  Surgeon: Waynetta Sandy, MD;  Location: [place];  Service: Vascular;  Laterality: Right;  . CARDIAC CATHETERIZATION N/A 07/19/2016   Procedure: Right/Left Heart Cath and Coronary Angiography;  Surgeon: Troy Sine, MD;  Location: [place] [facility];  Service: Cardiovascular;  Laterality: N/A;  . INGUINAL HERNIA REPAIR Bilateral   . PERIPHERAL VASCULAR INTERVENTION Right 07/26/2017   Procedure: PERIPHERAL VASCULAR INTERVENTION;   Surgeon: Serafina Mitchell, MD;  Location: [place] [facility];  Service: Cardiovascular;  Laterality: Right;       Family History  Problem Relation Age of Onset  . Alzheimer's disease Mother   . Diabetes Mother        Diabetes in the maternal side of the family but not in mother  . Heart disease Neg Hx   . Cancer Neg Hx     Social History   Tobacco Use  . Smoking status: Former Smoker    Packs/day: 1.00    Types: Cigarettes    Quit date: 09/08/2017    Years since quitting: 2.2  . Smokeless tobacco: Former Network engineer Use Topics  . Alcohol use: No    Comment: Former heavy binge drinker until 2014  . Drug use: No    Home Medications Prior to Admission medications   Medication Sig Start Date End Date Taking? Authorizing Provider  acetaminophen (TYLENOL) 500 MG tablet Take 500 mg by mouth every 4 (four) hours as needed for mild pain or moderate pain.    [provider]  aspirin 81 MG chewable tablet Chew 1 tablet (81 mg total) by mouth daily. 07/31/17   Barton Dubois, MD  atorvastatin (LIPITOR) 80 MG tablet Take 1 tablet (80 mg total) by mouth daily at 6 PM. 07/25/16   Regalado, Belkys A, MD  bisacodyl (DULCOLAX) 10 MG suppository Place 1 suppository (10 mg total) rectally daily as needed for severe constipation. 07/30/17   Barton Dubois, MD  carvedilol (COREG) 6.25 MG tablet Take 1 tablet (6.25 mg total) by mouth 2 (two) times daily with a meal. 07/25/16   Regalado, Belkys A, MD  Cholecalciferol (DIALYVITE VITAMIN D3 MAX) 22297 units TABS Take 1 tablet by mouth every 30 (thirty) days. 05/28/18   [provider]  clopidogrel (PLAVIX) 75 MG tablet Take 1 tablet (75 mg total) by mouth daily with breakfast. 07/31/17   Barton Dubois, MD  desvenlafaxine (PRISTIQ) 50 MG 24 hr tablet Take 50 mg by mouth daily.    [provider]  esomeprazole (NEXIUM) 40 MG capsule Take 40 mg by mouth daily at 12 noon.     [provider]  folic acid (FOLVITE) 1 MG  tablet Take 1 tablet (1 mg total) by mouth daily. 10/21/17   Caren Griffins, MD  furosemide (LASIX) 20 MG tablet Take 1 tablet (20 mg total) by mouth daily as needed for fluid or edema (weight gain 3 lbs in 24 hours or 5 lbs in 7 days.). 10/03/18   Arrien, Jimmy Picket, MD  gabapentin (NEURONTIN) 100 MG capsule Take 200 mg by mouth 3 (three) times daily.     [provider]  ipratropium-albuterol (DUONEB) 0.5-2.5 (3) MG/3ML SOLN Take 3 mLs by nebulization 4 (four) times daily as needed (SOB).     [provider]  isosorbide mononitrate (IMDUR) 30 MG 24 hr tablet Take 1 tablet (30 mg total) by mouth daily. 07/31/17   Barton Dubois, MD  nitroGLYCERIN (NITROSTAT) 0.4 MG SL tablet Place 1 tablet (0.4 mg total) under the  tongue every 5 (five) minutes as needed for chest pain. 07/25/16   Regalado, Belkys A, MD  NUTRITIONAL SUPPLEMENT LIQD Take 120 mLs by mouth 2 (two) times daily. [employer]    [provider]  Nutritional Supplements (ENSURE ACTIVE) LIQD Take 237 mLs by mouth 2 (two) times daily between meals.    [provider]  polyethylene glycol (MIRALAX / GLYCOLAX) packet Take 17 g by mouth 2 (two) times daily. 07/30/17   Barton Dubois, MD  senna-docusate (SENOKOT-S) 8.6-50 MG tablet Take 2 tablets by mouth at bedtime.    [provider]  thiamine 100 MG tablet Take 1 tablet (100 mg total) by mouth daily. 10/21/17   Caren Griffins, MD  vitamin B-12 (CYANOCOBALAMIN) 1000 MCG tablet Take 1,000 mcg by mouth daily.    [provider]    Allergies    Chantix [varenicline]  Review of Systems   Review of Systems  Constitutional: Negative for fever.  HENT: Negative for sore throat.   Eyes: Negative for visual disturbance.  Respiratory: Negative for shortness of breath.   Cardiovascular: Negative for chest pain.  Gastrointestinal: Negative for abdominal pain, nausea and vomiting.  Genitourinary: Negative for dysuria.  Musculoskeletal:  Positive for gait problem.  Skin: Negative for rash.  Neurological: Negative for seizures and headaches.    Physical Exam Updated Vital Signs BP (!) 94/57   Pulse (!) 103   Temp (!) 97.5 F (36.4 C) (Temporal)   Resp 17   SpO2 97%   Physical Exam Vitals and nursing note reviewed.  Constitutional:      Appearance: He is well-developed.  HENT:     Head: Normocephalic and atraumatic.  Eyes:     Conjunctiva/sclera: Conjunctivae normal.  Cardiovascular:     Rate and Rhythm: Normal rate and regular rhythm.     Heart sounds: No murmur.  Pulmonary:     Effort: Pulmonary effort is normal. No respiratory distress.     Breath sounds: Normal breath sounds.  Abdominal:     Palpations: Abdomen is soft.     Tenderness: There is no abdominal tenderness.  Musculoskeletal:        General: No deformity or signs of injury. Normal range of motion.     Cervical back: Neck supple.  Skin:    General: Skin is warm and dry.     Capillary Refill: Capillary refill takes less than 2 seconds.  Neurological:     General: No focal deficit present.     Mental Status: He is alert. Mental status is at baseline.     Comments: Patient is barely able to lift both of his legs off the bed.  Upper extremity strength better.  Awake and alert.  No facial droop.     ED Results / Procedures / Treatments   Labs (all labs ordered are listed, but only abnormal results are displayed) Labs Reviewed  RESPIRATORY PANEL BY RT PCR (FLU A&B, COVID) - Abnormal; Notable for the following components:      Result Value   SARS Coronavirus 2 by RT PCR POSITIVE (*)    All other components within normal limits  COMPREHENSIVE METABOLIC PANEL - Abnormal; Notable for the following components:   CO2 16 (*)    Glucose, Bld 126 (*)    BUN 91 (*)    Total Protein 5.4 (*)    Albumin 2.4 (*)    All other components within normal limits  CBC - Abnormal; Notable for the following components:   WBC  28.3 (*)    RBC 2.28 (*)     Hemoglobin 6.3 (*)    HCT 20.4 (*)    RDW 24.6 (*)    All other components within normal limits  LACTIC ACID, PLASMA - Abnormal; Notable for the following components:   Lactic Acid, Venous 4.4 (*)    All other components within normal limits  LACTIC ACID, PLASMA - Abnormal; Notable for the following components:   Lactic Acid, Venous 2.9 (*)    All other components within normal limits  PROTIME-INR - Abnormal; Notable for the following components:   Prothrombin Time 23.6 (*)    INR 2.1 (*)    All other components within normal limits  URINALYSIS, ROUTINE W REFLEX MICROSCOPIC - Abnormal; Notable for the following components:   Specific Gravity, Urine 1.032 (*)    All other components within normal limits  LACTIC ACID, PLASMA - Abnormal; Notable for the following components:   Lactic Acid, Venous 2.2 (*)    All other components within normal limits  LACTIC ACID, PLASMA - Abnormal; Notable for the following components:   Lactic Acid, Venous 2.5 (*)    All other components within normal limits  CBC - Abnormal; Notable for the following components:   WBC 19.3 (*)    RBC 2.84 (*)    Hemoglobin 8.2 (*)    HCT 25.4 (*)    RDW 19.9 (*)    All other components within normal limits  PHOSPHORUS - Abnormal; Notable for the following components:   Phosphorus 4.7 (*)    All other components within normal limits  COMPREHENSIVE METABOLIC PANEL - Abnormal; Notable for the following components:   CO2 17 (*)    Glucose, Bld 103 (*)    BUN 76 (*)    Calcium 8.4 (*)    Total Protein 5.0 (*)    Albumin 2.3 (*)    All other components within normal limits  CBC - Abnormal; Notable for the following components:   WBC 21.3 (*)    RBC 3.02 (*)    Hemoglobin 8.6 (*)    HCT 27.1 (*)    RDW 19.9 (*)    All other components within normal limits  CBG MONITORING, ED - Abnormal; Notable for the following components:   Glucose-Capillary 116 (*)    All other components within normal limits  POC OCCULT  BLOOD, ED - Abnormal; Notable for the following components:   Fecal Occult Bld POSITIVE (*)    All other components within normal limits  TROPONIN I (HIGH SENSITIVITY) - Abnormal; Notable for the following components:   Troponin I (High Sensitivity) 134 (*)    All other components within normal limits  TROPONIN I (HIGH SENSITIVITY) - Abnormal; Notable for the following components:   Troponin I (High Sensitivity) 136 (*)    All other components within normal limits  TROPONIN I (HIGH SENSITIVITY) - Abnormal; Notable for the following components:   Troponin I (High Sensitivity) 1,767 (*)    All other components within normal limits  TROPONIN I (HIGH SENSITIVITY) - Abnormal; Notable for the following components:   Troponin I (High Sensitivity) 2,693 (*)    All other components within normal limits  CULTURE, BLOOD (ROUTINE X 2)  CULTURE, BLOOD (ROUTINE X 2)  URINE CULTURE  MAGNESIUM  TSH  VITAMIN B12  FOLATE  IRON AND TIBC  FERRITIN  RETICULOCYTES  PROCALCITONIN  C-REACTIVE PROTEIN  D-DIMER, QUANTITATIVE (NOT AT [facility])  FIBRINOGEN  LACTATE DEHYDROGENASE  CBC WITH DIFFERENTIAL/PLATELET  CBC  CBC  PROTEIN ELECTROPHORESIS, SERUM  TYPE AND SCREEN  PREPARE RBC (CROSSMATCH)    EKG EKG Interpretation  Date/Time:  Monday November 26 2019 10:20:13 EST Ventricular Rate:  99 PR Interval:    QRS Duration: 105 QT Interval:  320 QTC Calculation: 411 R Axis:   69 Text Interpretation: Sinus tachycardia Ventricular premature complex Repol abnrm suggests ischemia, diffuse leads occasional PVC new but otherwise similar to previous Confirmed by Theotis Burrow [phone]) on 11/26/2019 10:26:37 AM   Radiology CT ANGIO CHEST PE W OR WO CONTRAST  Result Date: 11/26/2019 CLINICAL DATA:  Dyspnea, chronic, unclear etiology COVID positive. EXAM: CT ANGIOGRAPHY CHEST WITH CONTRAST TECHNIQUE: Multidetector CT imaging of the chest was performed using the standard protocol during bolus administration of  intravenous contrast. Multiplanar CT image reconstructions and MIPs were obtained to evaluate the vascular anatomy. CONTRAST:  184m OMNIPAQUE IOHEXOL 350 MG/ML SOLN COMPARISON:  Chest radiograph yesterday. Chest CT 10/18/2017 FINDINGS: Cardiovascular: There are no filling defects within the pulmonary arteries to suggest pulmonary embolus. Subsegmental branches are not well assessed. Aortic atherosclerosis. Cannot assess for dissection given phase of contrast bolus timing tailored to pulmonary artery evaluation. Coronary artery calcifications versus stents. Heart is normal in size. No pericardial effusion. Mediastinum/Nodes: Small mediastinal lymph nodes, all subcentimeter short axis. Largest node is prevascular measuring 9 mm. No hilar adenopathy. No esophageal wall thickening. No visualized thyroid nodule. 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 Minimal dependent atelectasis in the left lower lobe. Calcified granuloma in the right middle lobe. No noncalcified nodules. No pulmonary mass. No focal airspace disease. No ground-glass opacity typical of COVID pneumonia. No pleural fluid. Upper Abdomen: Assessed on abdominal CT yesterday. Musculoskeletal: Expansile lytic lesion involving the right posterolateral seventh rib with bony destruction. Lytic lesion involving posterior elements of T7 extend into and may obliterate spinal canal. Lesion extends to involve the inferior aspect of T6 spinous process. L1 vertebral body lesion was assessed on abdominal CT yesterday. Review of the MIP images confirms the above findings. IMPRESSION: 1. No pulmonary embolus. 2. 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 No evidence of acute pneumonia or primary bronchogenic malignancy. 3. Expansile lytic lesions involving T7 vertebral body which likely extends to and likely obliterates the spinal canal. Destructive lytic lesion involving right posterolateral seventh rib. In conjunction with bone lesions in the lumbar spine and pelvis,  findings may represent metastatic disease or multiple myeloma. 4. Aortic atherosclerosis. Coronary artery calcifications versus stents. Aortic Atherosclerosis (ICD10-I70.0) and Emphysema (ICD10-J43.9). Electronically Signed   By: MKeith RakeM.D.   On: 11/26/2019 03:59   CT Abdomen Pelvis W Contrast  Result Date: 11/26/2019 CLINICAL DATA:  GI bleed. EXAM: CT ABDOMEN AND PELVIS WITH CONTRAST TECHNIQUE: Multidetector CT imaging of the abdomen and pelvis was performed using the standard protocol following bolus administration of intravenous contrast. CONTRAST:  1056mOMNIPAQUE IOHEXOL 300 MG/ML  SOLN COMPARISON:  Pelvis CT 09/28/2018. No prior abdominal imaging. FINDINGS: Lower chest: Breathing motion artifact. No pleural fluid. Heart is normal in size. There are coronary artery calcifications. Hepatobiliary: Small subcentimeter low-density in the central liver is too small to characterize but likely small cyst or hemangioma. Elongated gallbladder. No pericholecystic inflammation. No calcified gallstone. No biliary dilatation. Pancreas: Parenchymal atrophy. No ductal dilatation or inflammation. No evidence pancreatic mass. Spleen: Normal in size without focal abnormality. Trace perisplenic fluid. Adrenals/Urinary Tract: No adrenal nodule. Cortical scarring in the upper left kidney. No hydronephrosis. Low-density lesions in the left kidney are too small to characterize but likely cysts. No  evidence of solid renal mass. Absent excretion on delayed phase imaging. Urinary bladder is distended. Mild bladder wall thickening at the base which appears similar to prior exam. The bladder appears slightly trabeculated. Stomach/Bowel: Motion and lack of enteric contrast limits gastric evaluation. There is equivocal gastric wall thickening about the proximal stomach. Small bowel is decompressed. No bowel obstruction or small bowel inflammation. Sigmoid colonic wall thickening with pericolonic edema, moderate length  segment. Colonic diverticulosis without evidence of focal diverticulitis. No obvious colonic mass. The appendix is not confidently visualized. Vascular/Lymphatic: Severe aortic atherosclerosis. Advanced branch and peripheral atherosclerosis. Dense calcification at the origin of the celiac artery. Proximal mesenteric vessels are patent. Portal vein is patent. No enlarged lymph nodes in the abdomen or pelvis. Reproductive: Prostate gland not enlarged. Other: Trace perisplenic free fluid. No free air. Musculoskeletal: Multiple lytic lesions involving the lumbar spine and bony pelvis. L1 lesion involves the vertebral body, right pedicle and lamina with extraosseous extension, there is mass effect and invasion of the spinal canal. Pathologic fracture with infiltrated lesion of L3 vertebral body with soft tissue extension peripherally into the bilateral psoas muscles. Posterior extension into the spinal canal. Expansile lucent lesion involving the right anterior iliac bone, this is at site of previous cortical irregularity. Permeative expansile lesion involving the left posterior acetabulum and ischium. Lytic lesion involving the left pubic body. IMPRESSION: 1. Sigmoid colonic wall thickening with pericolonic edema, suspicious for colitis. This may be infectious or inflammatory, and likely cause of patient's GI bleed. Neoplastic involvement is felt less likely given length of involvement but not entirely excluded. 2. Multiple lytic lesions involving the lumbar spine and bony pelvis. Findings may represent metastatic disease or multiple myeloma. There is extraosseous extension at L1 and L3 with mass effect and invasion of the spinal canal. Expansile lytic lesions in the right iliac bone is at site of previous cortical irregularity. Permeative lytic lesion involving the posterior left acetabulum and left pubic body. Given history of emphysema, consider further evaluation with chest CT to evaluate for pulmonary mass  (preferably with IV contrast to assess for hilar regions). 3. Distended urinary bladder with mild bladder wall thickening at the base and suggestion of trabeculation, similar to prior exam. 4. Equivocal gastric wall thickening about the proximal stomach. 5. Advanced aortic and branch atherosclerosis. Severe peripheral vascular disease. Aortic Atherosclerosis (ICD10-I70.0). Electronically Signed   By: Keith Rake M.D.   On: 11/26/2019 00:57   DG Chest Port 1 View  Result Date: 11/25/2019 CLINICAL DATA:  Weakness EXAM: PORTABLE CHEST 1 VIEW COMPARISON:  Radiograph 02/10/2018, CT 10/18/2017 FINDINGS: Background of diffusely coarsened interstitial changes in the lungs which may be accentuated by low lung volumes. No focal consolidative opacity. No convincing features of edema. The aorta is calcified. The remaining cardiomediastinal contours are unremarkable. No acute osseous or soft tissue abnormality. Degenerative changes are present in the imaged spine and shoulders. The osseous structures appear diffusely demineralized which may limit detection of small or nondisplaced fractures. IMPRESSION: Background of chronic interstitial changes and emphysema likely accentuated by low lung volumes. No definite acute cardiopulmonary abnormality. Aortic Atherosclerosis (ICD10-I70.0). Electronically Signed   By: Lovena Le M.D.   On: 11/25/2019 21:31    Procedures .Critical Care Performed by: Hayden Rasmussen, MD Authorized by: Hayden Rasmussen, MD   Critical care provider statement:    Critical care time (minutes):  45   Critical care time was exclusive of:  Separately billable procedures and treating other patients   Critical  care was necessary to treat or prevent imminent or life-threatening deterioration of the following conditions:  Shock and circulatory failure   Critical care was time spent personally by me on the following activities:  Discussions with consultants, evaluation of patient's response  to treatment, examination of patient, ordering and performing treatments and interventions, ordering and review of laboratory studies, ordering and review of radiographic studies, pulse oximetry, re-evaluation of patient's condition, obtaining history from patient or surrogate, review of old charts and development of treatment plan with patient or surrogate   I assumed direction of critical care for this patient from another provider in my specialty: no     (including critical care time)  Medications Ordered in ED Medications  sodium chloride flush (NS) 0.9 % injection 3 mL (3 mLs Intravenous Not Given 11/25/19 2043)  0.9 %  sodium chloride infusion (has no administration in time range)  sodium chloride 0.9 % bolus 1,000 mL (0 mLs Intravenous Stopped 11/25/19 2228)  pantoprazole (PROTONIX) injection 40 mg (40 mg Intravenous Given 11/25/19 2228)    ED Course  I have reviewed the triage vital signs and the nursing notes.  Pertinent labs & imaging results that were available during my care of the patient were reviewed by me and considered in my medical decision making (see chart for details).  Clinical Course as of Nov 24 2298  Sun Nov 25, 3087  6258 79 year old male brought in by EMS for altered mental status and hypotension.  Patient blood pressure improved here to 90/57.  Denies complaints.  Differential includes sepsis, dehydration, medication reaction, metabolic derangement, anemia.  Getting chest x-ray EKG lab work urinalysis and giving some IV fluids.   [MB]  2121 Critical hemoglobin back at 6.3.  Have added on additional labs.   [MB]  2125 Chest x-ray interpreted by me as no pneumothorax no gross infiltrates.   [MB]  2139 Rectal exam done with nurse as chaperone.  Decreased sphincter tone dark stool in vault sent to lab for guaiac.   [MB]  2214 Discussed with Dr. Fuller Plan from of our GI.  He said that they would see him tomorrow but to contact him if he becomes unstable.   [MB]  2214  Discussed with Dr. Roel Cluck from [facility] hospitalist who will evaluate the patient for admission.  She is asking if we get a CT abdomen and pelvis which I have ordered.   [MB]    Clinical Course User Index [MB] Hayden Rasmussen, MD   MDM Rules/Calculators/A&P                      Final Clinical Impression(s) / ED Diagnoses Final diagnoses:  Gastrointestinal hemorrhage, unspecified gastrointestinal hemorrhage type  Symptomatic anemia  Hypotension due to hypovolemia  SIRS (systemic inflammatory response syndrome) ([place])    Rx / DC Orders ED Discharge Orders    None       Hayden Rasmussen, MD 11/26/19 1121

## 2019-11-25 NOTE — ED Notes (Signed)
Care giver - [name] [phone]

## 2019-11-25 NOTE — ED Notes (Signed)
Pt getting Blood labs can not be drawn at this time.

## 2019-11-25 NOTE — Progress Notes (Signed)
Pharmacy Antibiotic Note  Vincent Dunlap is a 79 y.o. male admitted on 11/25/2019 with AMS/hypotension, possible sepsis.  Pharmacy has been consulted for Vancomycin and Cefepime dosing.  Plan: Vancomycin 1500 mg IV q24h Est AUC 457  Cefepime 2 g IV q8h     Temp (24hrs), Avg:98 F (36.7 C), Min:97.5 F (36.4 C), Max:98.4 F (36.9 C)  Recent Labs  Lab 11/25/19 2044 11/25/19 2109  WBC 28.3*  --   CREATININE 1.15  --   LATICACIDVEN  --  4.4*    CrCl cannot be calculated (Unknown ideal weight.).    Allergies  Allergen Reactions  . Chantix [Varenicline] Other (See Comments)    Pt states it made him "crazy"     Caryl Pina 11/25/2019 11:48 PM

## 2019-11-25 NOTE — ED Triage Notes (Signed)
Pt BIB [facility] from [facility] d/t AMS and hypotension. Per EMS, staff reports pt was unable to carry on conversation. On EMS arrival pt BP 60 palpated. 500 mL bolus given BP increased to 80.   On arrival pt A&Ox3, disoriented to situation.

## 2019-11-25 NOTE — ED Notes (Signed)
RN contacted pt emergency contact and obtained verbal over the phone consent for the patient with two RNs verifying consent.

## 2019-11-25 NOTE — H&P (Signed)
LITTLETON HAUB FXJ:[id number] DOB: [birth year] DOA: 11/25/2019    PCP: Hendricks Limes, MD   Outpatient Specialists:   CARDS:  Dr. Claiborne Billings   Pulmonary   Dr. Melvyn Novas   Patient arrived to ER on 11/25/19 at 2035  Patient coming from:  From facility [facility]    Chief Complaint:   Chief Complaint  Patient presents with  . Altered Mental Status  . Hypotension    HPI: Vincent Dunlap is a 79 y.o. 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 She could not provide detailed history but denied any chest pain shortness of breath abdominal pain patient at baseline unable to ambulate  Admitted to facility from hospitalization from December 2018 May for chest pain evaluation found not to be a surgical candidate due to multiple medical comorbidities despite triple-vessel coronary artery disease.  She has advanced COPD and continue to be an active smoker until she was admitted to SNF. Prior to admission to SNF patient was drinking 6-7 beers a day Patient has known history of anemia and been getting B12 and iron supplements. 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  She has known history of chronic elbow wound  Infectious risk factors:  Reports none    In  ER   COVID TEST    POSITIVE,    Lab Results  Component Value Date   SARSCOV2NAA POSITIVE (A) 11/25/2019     Regarding pertinent Chronic problems:    Hyperlipidemia -  on statins Lipitor   HTN on coreg,  imdur   chronic CHF combined diastolic/ Systolic  - last echo 15/83/0940 LV EF: 45% -   50%  (grade 1 diastolic dysfunction   CAD  - On Aspirin, statin, betablocker, Plavix                  Not a surgical candidate      COPD -  followed by pulmonology on [place]     CKD stage III - baseline Cr 1.2 Lab Results  Component Value Date   CREATININE 1.15 11/25/2019   CREATININE 0.95 09/29/2018   CREATININE 1.24 09/28/2018      While in ER: Chest x-ray showing COPD Noted to have elevated lactic acid Urine cultures and blood cultures obtained Covid testing obtained Found to have Hemoccult positive stool BUN elevated at 91 Troponin elevated at 134 Lactic acid up to 4.4 hemoglobin down to 6.3  GI has been consulted  protonix and transfusion   ER provider Spoke to DR. Fuller Plan   The following Work up has been ordered so far:  Orders Placed This Encounter  Procedures  . Urine culture  . Culture, blood (routine x 2)  . Respiratory Panel by RT PCR (Flu A&B, Covid) - Nasopharyngeal Swab  . DG Chest Port 1 View  . Comprehensive metabolic panel  . CBC  . Lactic acid, plasma  . Urinalysis, Routine w reflex microscopic  . Protime-INR  . Diet NPO time specified  .  Cardiac monitoring  . Saline Lock IV, Maintain IV access  . Neuro checks q 2 hours x12 hours  . Complete patient signature process for consent form  . Practitioner attestation of consent  . Consult to gastroenterology  ALL PATIENTS BEING ADMITTED/HAVING PROCEDURES NEED COVID-19 SCREENING  . Consult to hospitalist  ALL PATIENTS BEING ADMITTED/HAVING PROCEDURES NEED COVID-19 SCREENING  . Pulse oximetry, continuous  . CBG monitoring, ED  . POC occult blood, ED  . ED EKG  . EKG 12-Lead  . Type and screen [place]  . Prepare RBC    Following Medications were ordered in ER: Medications  sodium chloride flush (NS) 0.9 % injection 3 mL (3 mLs Intravenous Not Given 11/25/19 2043)  0.9 %  sodium chloride  infusion (has no administration in time range)  pantoprazole (PROTONIX) injection 40 mg (has no administration in time range)  sodium chloride 0.9 % bolus 1,000 mL (1,000 mLs Intravenous New Bag/Given 11/25/19 2101)        Consult Orders  (From admission, onward)         Start     Ordered   11/25/19 2215  Consult to hospitalist  ALL PATIENTS BEING ADMITTED/HAVING PROCEDURES NEED COVID-19 SCREENING  Once    Comments: ALL PATIENTS BEING ADMITTED/HAVING PROCEDURES NEED COVID-19 SCREENING  Provider:  (Not yet assigned)  Question Answer Comment  Place call to: [facility]   Reason for Consult Admit      11/25/19 2214           Significant initial  Findings: Abnormal Labs Reviewed  COMPREHENSIVE METABOLIC PANEL - Abnormal; Notable for the following components:      Result Value   CO2 16 (*)    Glucose, Bld 126 (*)    BUN 91 (*)    Total Protein 5.4 (*)    Albumin 2.4 (*)    All other components within normal limits  CBC - Abnormal; Notable for the following components:   WBC 28.3 (*)    RBC 2.28 (*)    Hemoglobin 6.3 (*)    HCT 20.4 (*)    RDW 24.6 (*)    All other components within normal limits  LACTIC ACID, PLASMA - Abnormal; Notable for the following components:   Lactic Acid, Venous 4.4 (*)    All other components within normal limits  PROTIME-INR - Abnormal; Notable for the following components:   Prothrombin Time 23.6 (*)    INR 2.1 (*)    All other components within normal limits  CBG MONITORING, ED - Abnormal; Notable for the following components:   Glucose-Capillary 116 (*)    All other components within normal limits  POC OCCULT BLOOD, ED - Abnormal; Notable for the following components:   Fecal Occult Bld POSITIVE (*)    All other components within normal limits  TROPONIN I (HIGH SENSITIVITY) - Abnormal; Notable for the following components:   Troponin I (High Sensitivity) 134 (*)    All other components within normal limits    Otherwise labs  showing:    Recent Labs  Lab 11/25/19 2044  NA 138  K 4.4  CO2 16*  GLUCOSE 126*  BUN 91*  CREATININE 1.15  CALCIUM 9.0    Cr   stable,    Lab Results  Component Value Date   CREATININE 1.15 11/25/2019   CREATININE 0.95 09/29/2018   CREATININE 1.24 09/28/2018    Recent Labs  Lab 11/25/19 2044  AST 18  ALT 10  ALKPHOS  102  BILITOT 0.7  PROT 5.4*  ALBUMIN 2.4*   Lab Results  Component Value Date   CALCIUM 9.0 11/25/2019   PHOS 3.8 10/20/2017     WBC      Component Value Date/Time   WBC 28.3 (H) 11/25/2019 2044   ANC    Component Value Date/Time   NEUTROABS 6.3 09/28/2018 1257   NEUTROABS 6 04/04/2018 0000   NEUTROABS 4.6 08/30/2012 1348   ALC No components found for: LYMPHAB    Plt: Lab Results  Component Value Date   PLT 338 11/25/2019    Lactic Acid, Venous    Component Value Date/Time   LATICACIDVEN 4.4 ([place]) 11/25/2019 2109    Procalcitonin  Ordered   COVID-19 Labs  No results for input(s): DDIMER, FERRITIN, LDH, CRP in the last 72 hours.  No results found for: SARSCOV2NAA     HG/HCT   Down *Up from baseline see below    Component Value Date/Time   HGB 6.3 (LL) 11/25/2019 2044   HGB 12.5 (L) 08/30/2012 1348   HCT 20.4 (L) 11/25/2019 2044   HCT 37.4 (L) 08/30/2012 1348    No results for input(s): LIPASE, AMYLASE in the last 168 hours. No results for input(s): AMMONIA in the last 168 hours.  No components found for: LABALBU   Troponin  134 Cardiac Panel (last 3 results) No results for input(s): CKTOTAL, CKMB, TROPONINI, RELINDX in the last 72 hours.     ECG: Ordered Personally reviewed by me showing: HR : 106 Rhythm:  Sinus tachycardia   nonspecific changes,   QTC 433   CBG (last 3)  Recent Labs    11/25/19 2048  GLUCAP 116*     UA  ordered         CXR - COPD  CTabd/pelvis - showed colitis, prominent bony lesions with lytic lesions throughout L3 vertebral body with soft tissue extension into spinal canal       ED Triage Vitals  Enc Vitals Group     BP 11/25/19 2038 (!) 94/57     Pulse Rate 11/25/19 2038 (!) 103     Resp 11/25/19 2038 17     Temp 11/25/19 2039 (!) 97.5 F (36.4 C)     Temp Source 11/25/19 2039 Temporal     SpO2 11/25/19 2038 97 %     Weight --      Height --      Head Circumference --      Peak Flow --      Pain Score --      Pain Loc --      Pain Edu? --      Excl. in [place]? --   TMAX(24)@       Latest  Blood pressure (!) 114/59, pulse (!) 107, temperature (!) 97.5 F (36.4 C), temperature source Temporal, resp. rate 16, SpO2 100 %.    Hospitalist was called for admission for Upper GI bleed, colitis, multiple bony lesions.   Review of Systems:    Pertinent positives include: fatigue,  Constitutional:  No weight loss, night sweats, Fevers, chills,  weight loss  HEENT:  No headaches, Difficulty swallowing,Tooth/dental problems,Sore throat,  No sneezing, itching, ear ache, nasal congestion, post nasal drip,  Cardio-vascular:  No chest pain, Orthopnea, PND, anasarca, dizziness, palpitations.no Bilateral lower extremity swelling  GI:  No heartburn, indigestion, abdominal pain, nausea, vomiting, diarrhea, change in bowel habits, loss of appetite, melena, blood in stool, hematemesis Resp:  no shortness of breath at  rest. No dyspnea on exertion, No excess mucus, no productive cough, No non-productive cough, No coughing up of blood.No change in color of mucus.No wheezing. Skin:  no rash or lesions. No jaundice GU:  no dysuria, change in color of urine, no urgency or frequency. No straining to urinate.  No flank pain.  Musculoskeletal:  No joint pain or no joint swelling. No decreased range of motion. No back pain.  Psych:  No change in mood or affect. No depression or anxiety. No memory loss.  Neuro: no localizing neurological complaints, no tingling, no weakness, no double vision, no gait abnormality, no slurred speech, no confusion  All systems reviewed and  apart from [place] all are negative  Past Medical History:   Past Medical History:  Diagnosis Date  . CAD (coronary artery disease) 07/2016   3 vessel disease,  evaluated by Dr Servando Snare and felt to be a poor surgical candidate  . CHF (congestive heart failure) ([place])   . Hypertension   . Ischemic cardiomyopathy 2017   EF 30%  . Tobacco abuse      Past Surgical History:  Procedure Laterality Date  . ABDOMINAL AORTOGRAM W/LOWER EXTREMITY Right 07/26/2017   Procedure: ABDOMINAL AORTOGRAM W/LOWER EXTREMITY;  Surgeon: Serafina Mitchell, MD;  Location: [place] [facility];  Service: Cardiovascular;  Laterality: Right;  . AMPUTATION Right 07/28/2017   Procedure: AMPUTATION RIGHT Second TOE;  Surgeon: Waynetta Sandy, MD;  Location: [place];  Service: Vascular;  Laterality: Right;  . CARDIAC CATHETERIZATION N/A 07/19/2016   Procedure: Right/Left Heart Cath and Coronary Angiography;  Surgeon: Troy Sine, MD;  Location: [place] [facility];  Service: Cardiovascular;  Laterality: N/A;  . INGUINAL HERNIA REPAIR Bilateral   . PERIPHERAL VASCULAR INTERVENTION Right 07/26/2017   Procedure: PERIPHERAL VASCULAR INTERVENTION;  Surgeon: Serafina Mitchell, MD;  Location: [place] [facility];  Service: Cardiovascular;  Laterality: Right;    Social History:  Ambulatory  wheelchair bound,      reports that he quit smoking about 2 years ago. His smoking use included cigarettes. He smoked 1.00 pack per day. He has quit using smokeless tobacco. He reports that he does not drink alcohol or use drugs.     Family History:   Family History  Problem Relation Age of Onset  . Alzheimer's disease Mother   . Diabetes Mother        Diabetes in the maternal side of the family but not in mother  . Heart disease Neg Hx   . Cancer Neg Hx     Allergies: Allergies  Allergen Reactions  . Chantix [Varenicline] Other (See Comments)    Pt states it made him "crazy"     Prior to Admission medications     Medication Sig Start Date End Date Taking? Authorizing Provider  acetaminophen (TYLENOL) 500 MG tablet Take 500 mg by mouth every 4 (four) hours as needed for mild pain or moderate pain.    [provider]  aspirin 81 MG chewable tablet Chew 1 tablet (81 mg total) by mouth daily. 07/31/17   Barton Dubois, MD  atorvastatin (LIPITOR) 80 MG tablet Take 1 tablet (80 mg total) by mouth daily at 6 PM. 07/25/16   Regalado, Belkys A, MD  bisacodyl (DULCOLAX) 10 MG suppository Place 1 suppository (10 mg total) rectally daily as needed for severe constipation. 07/30/17   Barton Dubois, MD  carvedilol (COREG) 6.25 MG tablet Take 1 tablet (6.25 mg total) by mouth 2 (two) times daily  with a meal. 07/25/16   Regalado, Belkys A, MD  Cholecalciferol (DIALYVITE VITAMIN D3 MAX) 61607 units TABS Take 1 tablet by mouth every 30 (thirty) days. 05/28/18   [provider]  clopidogrel (PLAVIX) 75 MG tablet Take 1 tablet (75 mg total) by mouth daily with breakfast. 07/31/17   Barton Dubois, MD  desvenlafaxine (PRISTIQ) 50 MG 24 hr tablet Take 50 mg by mouth daily.    [provider]  esomeprazole (NEXIUM) 40 MG capsule Take 40 mg by mouth daily at 12 noon.     [provider]  folic acid (FOLVITE) 1 MG tablet Take 1 tablet (1 mg total) by mouth daily. 10/21/17   Caren Griffins, MD  furosemide (LASIX) 20 MG tablet Take 1 tablet (20 mg total) by mouth daily as needed for fluid or edema (weight gain 3 lbs in 24 hours or 5 lbs in 7 days.). 10/03/18   Arrien, Jimmy Picket, MD  gabapentin (NEURONTIN) 100 MG capsule Take 200 mg by mouth 3 (three) times daily.     [provider]  ipratropium-albuterol (DUONEB) 0.5-2.5 (3) MG/3ML SOLN Take 3 mLs by nebulization 4 (four) times daily as needed (SOB).     [provider]  isosorbide mononitrate (IMDUR) 30 MG 24 hr tablet Take 1 tablet (30 mg total) by mouth daily. 07/31/17   Barton Dubois, MD  nitroGLYCERIN (NITROSTAT) 0.4 MG  SL tablet Place 1 tablet (0.4 mg total) under the tongue every 5 (five) minutes as needed for chest pain. 07/25/16   Regalado, Belkys A, MD  NUTRITIONAL SUPPLEMENT LIQD Take 120 mLs by mouth 2 (two) times daily. [employer]    [provider]  Nutritional Supplements (ENSURE ACTIVE) LIQD Take 237 mLs by mouth 2 (two) times daily between meals.    [provider]  polyethylene glycol (MIRALAX / GLYCOLAX) packet Take 17 g by mouth 2 (two) times daily. 07/30/17   Barton Dubois, MD  senna-docusate (SENOKOT-S) 8.6-50 MG tablet Take 2 tablets by mouth at bedtime.    [provider]  thiamine 100 MG tablet Take 1 tablet (100 mg total) by mouth daily. 10/21/17   Caren Griffins, MD  vitamin B-12 (CYANOCOBALAMIN) 1000 MCG tablet Take 1,000 mcg by mouth daily.    [provider]   Physical Exam: Blood pressure (!) 114/59, pulse (!) 107, temperature (!) 97.5 F (36.4 C), temperature source Temporal, resp. rate 16, SpO2 100 %. 1. General:  in No  Acute distress    Chronically ill -appearing 2. Psychological: Alert and   Oriented 3. Head/ENT:    Dry Mucous Membranes                          Head Non traumatic, neck supple                        Poor Dentition 4. SKIN:  decreased Skin turgor,  Skin clean Dry and intact no rash 5. Heart: Regular rate and rhythm no  Murmur, no Rub or gallop 6. Lungs:  Clear to auscultation bilaterally, no wheezes or crackles   7. Abdomen: Soft,  non-tender, Non distended  bowel sounds present 8. Lower extremities: no clubbing, cyanosis, no  edema 9. Neurologically patient unable to cooperate fully with physical exam not alert and oriented to situation  10. MSK: Normal range of motion   All other LABS:     Recent Labs  Lab 11/25/19 2044  WBC 28.3*  HGB 6.3*  HCT 20.4*  MCV 89.5  PLT 338     Recent Labs  Lab 11/25/19 2044  NA 138  K 4.4  CL 108  CO2 16*  GLUCOSE 126*  BUN 91*  CREATININE 1.15  CALCIUM 9.0     Recent  Labs  Lab 11/25/19 2044  AST 18  ALT 10  ALKPHOS 102  BILITOT 0.7  PROT 5.4*  ALBUMIN 2.4*      Cultures:    Component Value Date/Time   SDES BLOOD LEFT FOREARM 07/17/2017 0130   SPECREQUEST  07/17/2017 0130    BOTTLES DRAWN AEROBIC AND ANAEROBIC Blood Culture adequate volume   CULT NO GROWTH 5 DAYS 07/17/2017 0130   REPTSTATUS 07/22/2017 FINAL 07/17/2017 0130     Radiological Exams on Admission: DG Chest Port 1 View  Result Date: 11/25/2019 CLINICAL DATA:  Weakness EXAM: PORTABLE CHEST 1 VIEW COMPARISON:  Radiograph 02/10/2018, CT 10/18/2017 FINDINGS: Background of diffusely coarsened interstitial changes in the lungs which may be accentuated by low lung volumes. No focal consolidative opacity. No convincing features of edema. The aorta is calcified. The remaining cardiomediastinal contours are unremarkable. No acute osseous or soft tissue abnormality. Degenerative changes are present in the imaged spine and shoulders. The osseous structures appear diffusely demineralized which may limit detection of small or nondisplaced fractures. IMPRESSION: Background of chronic interstitial changes and emphysema likely accentuated by low lung volumes. No definite acute cardiopulmonary abnormality. Aortic Atherosclerosis (ICD10-I70.0). Electronically Signed   By: Lovena Le M.D.   On: 11/25/2019 21:31    Chart has been reviewed   Assessment/Plan  79 y.o. 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 Upper GI bleed -  - Glasgow Blatch[place] score BUN >18.2  , Hg <22 , systolic BP <297   , melena    CHF   >1 Justifies admission and aggressive management      Modifying risk factors include:  alcohol abuse  On plavix        -    AIMS 65 = Alb <3,  INR >1.5 Mental status change, SBP <90 (0-4) TOTAL of 4              Worrisome       -   hemodynamic instability present      - Admit to stepdown given above    -  ER  Provider spoke to gastroenterology ( [facility]) they will see patient in a.m. appreciate their consult   - serial CBC.    - Monitor for any recurrence,  evidence of hemodynamic instability or significant blood loss  - Transfuse  for hemoglobin below 7 or evidence of life-threatening bleeding  - Establish at least 2 PIV and fluid resuscitate   - clear liquids for tonight keep nothing by mouth post midnight,   -  administer Protonix drip    . Essential hypertension - hold home meds, given initial hypotension  . CAD (coronary artery disease), native coronary artery hold plavix she may need further work-up will need to discuss with medical power of attorney in a.m.  Marland Kitchen Chronic pain syndrome -pain management as able to tolerate  . COPD GOLD II -chronic continue home medications  . Sepsis ([place]) -most  likely colitis is etiology given elevated lactic acid white blood cell count and hypotension Obtain blood cultures for now, broad-spectrum antibiotics given severity of illness   Covid infection -obtain inflammatory markers.  Patient at this point not hypoxic. 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 -Patient has been long-term wheelchair-bound suspect this is not an acute change Will need discuss in a.m. with radiation oncology to see if would be able to treat if palliative radiation. Check SPEP for evidence of multiple myeloma May benefit from steroids but given possible upper GI bleed would need to discuss with GI in a.m. -Would benefit from further staging with CT of the chest patient risk for lung carcinoma given history of severe COPD     Colitis treat  with  antibiotics appreciate GI input   . Hypotension -in the setting of possible upper GI blood loss admit to stepdown transfuse rehydrate   . Microcytic anemia -chronic but currently worse from baseline   . Acute metabolic encephalopathy -initially in the setting of hypertension currently improved after blood pressure is improved.  Would obtain MRI to evaluate for any metastatic spread to the brain   . Acute kidney injury ([place]) -we will gently rehydrate and follow    . Elevated troponin-  -no chest pain no EKG changes in the setting of anemia and hypotension  likely due to demand ischemia and poor clearance, monitor on telemetry and cycle cardiac enzymes to trend.  if continues to rise will need further work-up  . Leukocytosis-unclear source evidence of colitis on CT, obtain CBC with differential    Other plan as per orders.  DVT prophylaxis:  SCD    Code Status:    DNR/DNI as per paperwork from SNF,     Family Communication:   Family not at  Bedside, will need to discuss with medical power of attorney in a.m. given extensive new findings and need for further work-up patient at this point altered unable to provide his own history    Disposition Plan:                              Back to current facility when stable                                                 Would benefit from PT/OT eval prior to DC  Ordered                                       Consults called: [facility]  Admission status:  ED Disposition    None         inpatient     Expect 2 midnight stay secondary to severity of patient's current illness including   hemodynamic instability despite optimal treatment ( hypotension )   Severe lab/radiological/exam abnormalities including:  leukocytosis   and extensive comorbidities including:  CHF   CAD  COPD/asthma  CKD     That are currently affecting medical management.   I expect  patient to be hospitalized for 2 midnights requiring inpatient medical  care.  Patient is at high risk  for adverse outcome (such as loss of life or disability) if not treated.  Indication for inpatient stay as follows:  Severe change from baseline regarding mental status Hemodynamic instability despite maximal medical therapy,    inability to maintain oral hydration    Need for operative/procedural  intervention    Need for IV antibiotics, IV fluids,transfusion, IV PPI     Level of care         SDU tele indefinitely please discontinue once patient no longer qualifies   Precautions: admitted as   PUI  No active isolations   If Covid PCR is negative  - please DC precautions    PPE: Used by the provider:   P100  eye Goggles,  Gloves    Michaeleen Down 11/26/2019, 2:14 AM    [facility]     after 2 AM please page floor coverage PA If 7AM-7PM, please contact the day team taking care of the patient using [email or web address]   Patient was evaluated in the context of the global COVID-19 pandemic, which necessitated consideration that the patient might be at risk for infection with the SARS-CoV-2 virus that causes COVID-19. Institutional protocols and algorithms that pertain to the evaluation of patients at risk for COVID-19 are in a state of rapid change based on information released by regulatory bodies including the CDC and federal and state organizations. These policies and algorithms were followed during the patient's care.

## 2019-11-26 ENCOUNTER — Inpatient Hospital Stay (HOSPITAL_COMMUNITY): Payer: Medicare (Managed Care)

## 2019-11-26 ENCOUNTER — Ambulatory Visit
Admit: 2019-11-26 | Discharge: 2019-11-26 | Disposition: A | Discharge status: Home / Self Care | Payer: Medicare (Managed Care) | Attending: Radiation Oncology | Admitting: Radiation Oncology

## 2019-11-26 ENCOUNTER — Encounter (HOSPITAL_COMMUNITY): Payer: Self-pay | Admitting: Internal Medicine

## 2019-11-26 DIAGNOSIS — G894 Chronic pain syndrome: Secondary | ICD-10-CM

## 2019-11-26 DIAGNOSIS — I9589 Other hypotension: Secondary | ICD-10-CM

## 2019-11-26 DIAGNOSIS — A419 Sepsis, unspecified organism: Secondary | ICD-10-CM

## 2019-11-26 DIAGNOSIS — I2510 Atherosclerotic heart disease of native coronary artery without angina pectoris: Secondary | ICD-10-CM

## 2019-11-26 DIAGNOSIS — J449 Chronic obstructive pulmonary disease, unspecified: Secondary | ICD-10-CM

## 2019-11-26 DIAGNOSIS — K922 Gastrointestinal hemorrhage, unspecified: Secondary | ICD-10-CM

## 2019-11-26 DIAGNOSIS — I472 Ventricular tachycardia: Secondary | ICD-10-CM

## 2019-11-26 DIAGNOSIS — D500 Iron deficiency anemia secondary to blood loss (chronic): Secondary | ICD-10-CM

## 2019-11-26 DIAGNOSIS — M899 Disorder of bone, unspecified: Secondary | ICD-10-CM

## 2019-11-26 DIAGNOSIS — I219 Acute myocardial infarction, unspecified: Secondary | ICD-10-CM

## 2019-11-26 DIAGNOSIS — R6520 Severe sepsis without septic shock: Secondary | ICD-10-CM

## 2019-11-26 DIAGNOSIS — N179 Acute kidney failure, unspecified: Secondary | ICD-10-CM

## 2019-11-26 DIAGNOSIS — I25118 Atherosclerotic heart disease of native coronary artery with other forms of angina pectoris: Secondary | ICD-10-CM

## 2019-11-26 DIAGNOSIS — C7951 Secondary malignant neoplasm of bone: Secondary | ICD-10-CM | POA: Insufficient documentation

## 2019-11-26 DIAGNOSIS — G9520 Unspecified cord compression: Secondary | ICD-10-CM

## 2019-11-26 DIAGNOSIS — U071 COVID-19: Secondary | ICD-10-CM

## 2019-11-26 DIAGNOSIS — D72829 Elevated white blood cell count, unspecified: Secondary | ICD-10-CM

## 2019-11-26 DIAGNOSIS — R778 Other specified abnormalities of plasma proteins: Secondary | ICD-10-CM

## 2019-11-26 DIAGNOSIS — Z87891 Personal history of nicotine dependence: Secondary | ICD-10-CM

## 2019-11-26 DIAGNOSIS — G9340 Encephalopathy, unspecified: Secondary | ICD-10-CM

## 2019-11-26 DIAGNOSIS — G9341 Metabolic encephalopathy: Secondary | ICD-10-CM

## 2019-11-26 DIAGNOSIS — I10 Essential (primary) hypertension: Secondary | ICD-10-CM

## 2019-11-26 LAB — CBC
HCT: 27.1 % — ABNORMAL LOW (ref 39.0–52.0)
HCT: 25.4 % — ABNORMAL LOW (ref 39.0–52.0)
HCT: 25.7 % — ABNORMAL LOW (ref 39.0–52.0)
HCT: 26.8 % — ABNORMAL LOW (ref 39.0–52.0)
Hemoglobin: 8.6 g/dL — ABNORMAL LOW (ref 13.0–17.0)
Hemoglobin: 8.2 g/dL — ABNORMAL LOW (ref 13.0–17.0)
Hemoglobin: 8.3 g/dL — ABNORMAL LOW (ref 13.0–17.0)
Hemoglobin: 8.8 g/dL — ABNORMAL LOW (ref 13.0–17.0)
MCH: 28.5 pg (ref 26.0–34.0)
MCH: 28.9 pg (ref 26.0–34.0)
MCH: 28.5 pg (ref 26.0–34.0)
MCH: 29 pg (ref 26.0–34.0)
MCHC: 31.7 g/dL (ref 30.0–36.0)
MCHC: 32.3 g/dL (ref 30.0–36.0)
MCHC: 32.3 g/dL (ref 30.0–36.0)
MCHC: 32.8 g/dL (ref 30.0–36.0)
MCV: 89.7 fL (ref 80.0–100.0)
MCV: 89.4 fL (ref 80.0–100.0)
MCV: 88.3 fL (ref 80.0–100.0)
MCV: 88.4 fL (ref 80.0–100.0)
Platelets: 336 10*3/uL (ref 150–400)
Platelets: 320 10*3/uL (ref 150–400)
Platelets: 317 10*3/uL (ref 150–400)
Platelets: 256 10*3/uL (ref 150–400)
RBC: 3.02 MIL/uL — ABNORMAL LOW (ref 4.22–5.81)
RBC: 2.84 MIL/uL — ABNORMAL LOW (ref 4.22–5.81)
RBC: 2.91 MIL/uL — ABNORMAL LOW (ref 4.22–5.81)
RBC: 3.03 MIL/uL — ABNORMAL LOW (ref 4.22–5.81)
RDW: 19.9 % — ABNORMAL HIGH (ref 11.5–15.5)
RDW: 19.9 % — ABNORMAL HIGH (ref 11.5–15.5)
RDW: 20.3 % — ABNORMAL HIGH (ref 11.5–15.5)
RDW: 20.8 % — ABNORMAL HIGH (ref 11.5–15.5)
WBC: 21.3 10*3/uL — ABNORMAL HIGH (ref 4.0–10.5)
WBC: 19.3 10*3/uL — ABNORMAL HIGH (ref 4.0–10.5)
WBC: 18.3 10*3/uL — ABNORMAL HIGH (ref 4.0–10.5)
WBC: 24.2 10*3/uL — ABNORMAL HIGH (ref 4.0–10.5)
nRBC: 0 % (ref 0.0–0.2)
nRBC: 0.1 % (ref 0.0–0.2)
nRBC: 0.2 % (ref 0.0–0.2)
nRBC: 0.2 % (ref 0.0–0.2)

## 2019-11-26 LAB — URINALYSIS, ROUTINE W REFLEX MICROSCOPIC
Bilirubin Urine: NEGATIVE
Glucose, UA: NEGATIVE mg/dL
Hgb urine dipstick: NEGATIVE
Ketones, ur: NEGATIVE mg/dL
Leukocytes,Ua: NEGATIVE
Nitrite: NEGATIVE
Protein, ur: NEGATIVE mg/dL
Specific Gravity, Urine: 1.032 — ABNORMAL HIGH (ref 1.005–1.030)
pH: 5 (ref 5.0–8.0)

## 2019-11-26 LAB — COMPREHENSIVE METABOLIC PANEL
ALT: 10 U/L (ref 0–44)
AST: 26 U/L (ref 15–41)
Albumin: 2.3 g/dL — ABNORMAL LOW (ref 3.5–5.0)
Alkaline Phosphatase: 99 U/L (ref 38–126)
Anion gap: 13 (ref 5–15)
BUN: 76 mg/dL — ABNORMAL HIGH (ref 8–23)
CO2: 17 mmol/L — ABNORMAL LOW (ref 22–32)
Calcium: 8.4 mg/dL — ABNORMAL LOW (ref 8.9–10.3)
Chloride: 111 mmol/L (ref 98–111)
Creatinine, Ser: 0.98 mg/dL (ref 0.61–1.24)
GFR calc Af Amer: >60 mL/min (ref >60)
GFR calc non Af Amer: >60 mL/min (ref >60)
Glucose, Bld: 103 mg/dL — ABNORMAL HIGH (ref 70–99)
Potassium: 3.9 mmol/L (ref 3.5–5.1)
Sodium: 141 mmol/L (ref 135–145)
Total Bilirubin: 0.8 mg/dL (ref 0.3–1.2)
Total Protein: 5 g/dL — ABNORMAL LOW (ref 6.5–8.1)

## 2019-11-26 LAB — TROPONIN I (HIGH SENSITIVITY)
Troponin I (High Sensitivity): 1767 ng/L (ref <18)
Troponin I (High Sensitivity): 2693 ng/L (ref <18)

## 2019-11-26 LAB — PROTIME-INR
INR: 1.8 — ABNORMAL HIGH (ref 0.8–1.2)
Prothrombin Time: 20.4 seconds — ABNORMAL HIGH (ref 11.4–15.2)

## 2019-11-26 LAB — MAGNESIUM: Magnesium: 1.7 mg/dL (ref 1.7–2.4)

## 2019-11-26 LAB — LACTIC ACID, PLASMA
Lactic Acid, Venous: 2.5 mmol/L (ref 0.5–1.9)
Lactic Acid, Venous: 2.2 mmol/L (ref 0.5–1.9)

## 2019-11-26 LAB — PHOSPHORUS: Phosphorus: 4.7 mg/dL — ABNORMAL HIGH (ref 2.5–4.6)

## 2019-11-26 LAB — ECHOCARDIOGRAM LIMITED

## 2019-11-26 LAB — TSH: TSH: 3.195 u[IU]/mL (ref 0.350–4.500)

## 2019-11-26 MED ORDER — IOHEXOL 350 MG/ML SOLN
100.0000 mL | Freq: Once | INTRAVENOUS | Status: AC | PRN
  Administered 2019-11-26: 100 mL via INTRAVENOUS

## 2019-11-26 MED ORDER — VITAMIN K1 10 MG/ML IJ SOLN
5.0000 mg | Freq: Once | INTRAMUSCULAR | Status: AC
  Administered 2019-11-26: 16:00:00 5 mg via SUBCUTANEOUS
  Filled 2019-11-26: qty 0.5

## 2019-11-26 MED ORDER — SODIUM CHLORIDE 0.9 % IV SOLN
200.0000 mg | Freq: Once | INTRAVENOUS | Status: AC
  Administered 2019-11-26: 200 mg via INTRAVENOUS
  Filled 2019-11-26: qty 200

## 2019-11-26 MED ORDER — AMITRIPTYLINE HCL 50 MG PO TABS
75.0000 mg | ORAL_TABLET | Freq: Every day | ORAL | Status: DC
  Administered 2019-11-26 – 2019-11-29 (×5): 75 mg via ORAL
  Filled 2019-11-26: qty 2
  Filled 2019-11-26: qty 3
  Filled 2019-11-26 (×3): qty 2

## 2019-11-26 MED ORDER — IOHEXOL 300 MG/ML  SOLN
100.0000 mL | Freq: Once | INTRAMUSCULAR | Status: AC | PRN
  Administered 2019-11-26: 100 mL via INTRAVENOUS

## 2019-11-26 MED ORDER — ACETAMINOPHEN 650 MG RE SUPP: 650.0000 mg | Freq: Four times a day (QID) | RECTAL | Status: DC | PRN

## 2019-11-26 MED ORDER — DEXAMETHASONE 4 MG PO TABS
4.0000 mg | ORAL_TABLET | Freq: Two times a day (BID) | ORAL | Status: DC
  Administered 2019-11-26 – 2019-11-28 (×5): 4 mg via ORAL
  Filled 2019-11-26 (×5): qty 1

## 2019-11-26 MED ORDER — PANTOPRAZOLE SODIUM 40 MG IV SOLR
40.0000 mg | Freq: Once | INTRAVENOUS | Status: AC
  Administered 2019-11-26: 02:00:00 40 mg via INTRAVENOUS
  Filled 2019-11-26: qty 40

## 2019-11-26 MED ORDER — ATORVASTATIN CALCIUM 80 MG PO TABS
80.0000 mg | ORAL_TABLET | Freq: Every day | ORAL | Status: DC
  Administered 2019-11-26 – 2019-11-27 (×2): 80 mg via ORAL
  Filled 2019-11-26 (×2): qty 1

## 2019-11-26 MED ORDER — IPRATROPIUM-ALBUTEROL 0.5-2.5 (3) MG/3ML IN SOLN: 3.0000 mL | Freq: Four times a day (QID) | RESPIRATORY_TRACT | Status: DC | PRN

## 2019-11-26 MED ORDER — ACETAMINOPHEN 325 MG PO TABS
650.0000 mg | ORAL_TABLET | Freq: Four times a day (QID) | ORAL | Status: DC | PRN
  Filled 2019-11-26: qty 2

## 2019-11-26 MED ORDER — THIAMINE HCL 100 MG PO TABS
100.0000 mg | ORAL_TABLET | Freq: Every day | ORAL | Status: DC
  Administered 2019-11-27 – 2019-11-28 (×2): 100 mg via ORAL
  Filled 2019-11-26 (×2): qty 1

## 2019-11-26 MED ORDER — SODIUM CHLORIDE 0.9 % IV SOLN
8.0000 mg/h | INTRAVENOUS | Status: DC
  Administered 2019-11-26 – 2019-11-28 (×4): 8 mg/h via INTRAVENOUS
  Filled 2019-11-26 (×7): qty 80

## 2019-11-26 MED ORDER — ONDANSETRON HCL 4 MG/2ML IJ SOLN: 4.0000 mg | Freq: Four times a day (QID) | INTRAMUSCULAR | Status: DC | PRN

## 2019-11-26 MED ORDER — SODIUM CHLORIDE 0.9 % IV SOLN: INTRAVENOUS | Status: AC

## 2019-11-26 MED ORDER — PANTOPRAZOLE SODIUM 40 MG IV SOLR: 40.0000 mg | Freq: Two times a day (BID) | INTRAVENOUS | Status: DC

## 2019-11-26 MED ORDER — HYDROCODONE-ACETAMINOPHEN 5-325 MG PO TABS: 1.0000 | ORAL_TABLET | ORAL | Status: DC | PRN

## 2019-11-26 MED ORDER — SODIUM CHLORIDE 0.9 % IV SOLN: INTRAVENOUS | Status: DC

## 2019-11-26 MED ORDER — ONDANSETRON HCL 4 MG PO TABS: 4.0000 mg | ORAL_TABLET | Freq: Four times a day (QID) | ORAL | Status: DC | PRN

## 2019-11-26 MED ORDER — CHLORHEXIDINE GLUCONATE CLOTH 2 % EX PADS
6.0000 | MEDICATED_PAD | Freq: Every day | CUTANEOUS | Status: DC
  Administered 2019-11-27 – 2019-11-29 (×4): 6 via TOPICAL

## 2019-11-26 MED ORDER — SODIUM CHLORIDE 0.9 % IV SOLN
100.0000 mg | Freq: Every day | INTRAVENOUS | Status: DC
  Administered 2019-11-27: 100 mg via INTRAVENOUS
  Filled 2019-11-26: qty 100
  Filled 2019-11-26: qty 20

## 2019-11-26 NOTE — Progress Notes (Signed)
  Echocardiogram 2D Echocardiogram has been performed.  Vincent Dunlap 11/26/2019, 10:12 AM

## 2019-11-26 NOTE — ED Notes (Signed)
Dr. Carlean Purl of GI at bedside

## 2019-11-26 NOTE — ED Notes (Signed)
Dr. Louanne Belton notified of elevated troponin

## 2019-11-26 NOTE — Progress Notes (Addendum)
Radiation Oncology         [phone]) [phone] ________________________________  Name: TYJAI MATUSZAK        MRN: [id number]  Date of Service:11/26/19  DOB: 09-08-1941  CC:Hendricks Limes, MD  No ref. provider found     REFERRING PHYSICIAN: No ref. provider found   DIAGNOSIS: The primary encounter diagnosis was Gastrointestinal hemorrhage, unspecified gastrointestinal hemorrhage type. 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   HISTORY OF PRESENT ILLNESS: RICHARD RITCHEY is a 79 y.o. male  male who is currently hospitalized within the [facility] ED. apparently the patient had previously been living independently but with a home health nurse named [name] who is known him for many years.  She provides the history as the patient has dementia and is currently also having an MRI in the emergency department.  He was hospitalized between 09/28/2018 and 10/03/2018 after an episode of orthostatic syncope complicated by metabolic encephalopathy.  Fortunately he was not in heart failure or having any ischemic issues he was discharged to the [place] facility where he has been residing since.  Due to the Covid pandemic Ms. Wynetta Emery has only been able to see him on rare occasion.  She saw him 4 months ago per report, she was also supposed to see him at Christmas time but was not able to because of some paperwork that have been filled out.  She talks to him regularly and states that she spoke to him about 2 weeks ago.  Apparently prior to his admission last night, he was experiencing hypotension and confusion.  He was brought to [facility] emergency department where he was found to have a troponin level of 134, this has gone up to 136, this morning 1767, and 7:00 2693.  Cardiology has waited and is under the impression that this is not from ischemia as the  patient is otherwise asymptomatic.  He was found to have COVID-19 infection, hemoglobin of 6.3, WBC of 28.3, and a positive Hemoccult.  GI is consulted as well.  Imaging studies of the chest with CT angio reveals no evidence of embolism, moderate to advanced emphysematous changes without evidence of pneumonia, expansile lytic lesions involving T7 vertebral body concerning for direct extension into the spinal canal destructive lytic lesion involving the right posterior lateral seventh rib.  CT of the abdomen and pelvis also revealed concerns for multiple lytic lesions involving the lumbar spine and bony pelvis the L1 lesion involves the vertebral body right pedicle and lamina with extraosseous extension and mass-effect and invasion of the spinal canal pathologic fracture and infiltration of L3 vertebral body also was seen with soft tissue extension peripherally into the bilateral psoas muscles, posterior extension into the spinal canal was noted, an expansile lucent lesion involving the right anterior lip iliac bone, left posterior acetabulum and ischium and left pubic body.  He has sigmoid colon thickening and pericolonic edema suspicious for colitis a distended urinary bladder with mild bladder thickening and equivocal gastric wall thickening about the proximal stomach.  Advanced aortic and branch atherosclerosis and severe peripheral vascular disease was also seen.  Given the concerns for possible cord compression, we have been asked to consult on this patient.  I have reached out to his ER nurse and he is currently an MRI, but spoke with oncology who was unable to speak with him this morning as well.  I was successful at reaching [name] his surrogate healthcare decision maker.     PREVIOUS RADIATION THERAPY: No   PAST MEDICAL HISTORY:  Past Medical History:  Diagnosis Date  . CAD (coronary artery disease) 07/2016   3 vessel disease,  evaluated by Dr Servando Snare and felt to be a poor surgical  candidate  . Chronic systolic CHF (congestive heart failure) ([place])   . Hyperlipidemia LDL goal <70   . Hypertension   . Ischemic cardiomyopathy 2017   EF 30%  . Tobacco abuse        PAST SURGICAL HISTORY: Past Surgical History:  Procedure Laterality Date  . ABDOMINAL AORTOGRAM W/LOWER EXTREMITY Right 07/26/2017   Procedure: ABDOMINAL AORTOGRAM W/LOWER EXTREMITY;  Surgeon: Serafina Mitchell, MD;  Location: [place] [facility];  Service: Cardiovascular;  Laterality: Right;  . AMPUTATION Right 07/28/2017   Procedure: AMPUTATION RIGHT Second TOE;  Surgeon: Waynetta Sandy, MD;  Location: [place];  Service: Vascular;  Laterality: Right;  . CARDIAC CATHETERIZATION N/A 07/19/2016   Procedure: Right/Left Heart Cath and Coronary Angiography;  Surgeon: Troy Sine, MD;  Location: [place] [facility];  Service: Cardiovascular;  Laterality: N/A;  . INGUINAL HERNIA REPAIR Bilateral   . PERIPHERAL VASCULAR INTERVENTION Right 07/26/2017   Procedure: PERIPHERAL VASCULAR INTERVENTION;  Surgeon: Serafina Mitchell, MD;  Location: [place] [facility];  Service: Cardiovascular;  Laterality: Right;     FAMILY HISTORY:  Family History  Problem Relation Age of Onset  . Alzheimer's disease Mother   . Diabetes Mother        Diabetes in the maternal side of the family but not in mother  . Heart disease Neg Hx   . Cancer Neg Hx      SOCIAL HISTORY:  reports that he quit smoking about 2 years ago. His smoking use included cigarettes. He smoked 1.00 pack per day. He has quit using smokeless tobacco. He reports that he does not drink alcohol or use drugs.  The patient is single and prior to residing at [facility] facility he has been in another nursing facility for short time after hospitalization.  Approximately 7 months ago however and prior to that he was living at home, [name] has been his home health nurse for almost 13 years, she states that he has son but they are not involved in  any of his care and that they do not have a relationship.  She also states that the others listed in his chart Octavia Bruckner may not be alive any longer, and she is not sure who Neoma Laming is.  She states that the patient is hard of hearing, he does get confused and relies on her to help him make decisions.  She also states that if he does have an advanced malignancy he may not want additional treatment, though she feels that this needs to be more clearly laid out once we have this information.  She requests being a part of conversations when new information is identified.   ALLERGIES: Chantix [varenicline]   MEDICATIONS:  Current Facility-Administered Medications  Medication Dose Route Frequency Provider Last Rate Last Admin  . acetaminophen (TYLENOL) tablet 650 mg  650 mg Oral Q6H PRN Doutova, Anastassia, MD       Or  . acetaminophen (TYLENOL) suppository 650 mg  650 mg Rectal Q6H PRN Doutova, Anastassia, MD      . amitriptyline (ELAVIL) tablet 75 mg  75 mg Oral QHS Toy Baker, MD   75  mg at 11/26/19 0142  . atorvastatin (LIPITOR) tablet 80 mg  80 mg Oral q1800 Doutova, Anastassia, MD      . ceFEPIme (MAXIPIME) 2 g in sodium chloride 0.9 % 100 mL IVPB  2 g Intravenous Q8H Toy Baker, MD   Stopped at 11/26/19 8338  . dexamethasone (DECADRON) tablet 4 mg  4 mg Oral Q12H Hayden Pedro, [place]      . HYDROcodone-acetaminophen (NORCO/VICODIN) 5-325 MG per tablet 1-2 tablet  1-2 tablet Oral Q4H PRN Doutova, Anastassia, MD      . ipratropium-albuterol (DUONEB) 0.5-2.5 (3) MG/3ML nebulizer solution 3 mL  3 mL Nebulization QID PRN Doutova, Anastassia, MD      . metroNIDAZOLE (FLAGYL) IVPB 500 mg  500 mg Intravenous Q8H Toy Baker, MD   Stopped at 11/26/19 0612  . ondansetron (ZOFRAN) tablet 4 mg  4 mg Oral Q6H PRN Toy Baker, MD       Or  . ondansetron (ZOFRAN) injection 4 mg  4 mg Intravenous Q6H PRN Doutova, Anastassia, MD      . pantoprazole (PROTONIX) 80 mg in sodium  chloride 0.9 % 250 mL (0.32 mg/mL) infusion  8 mg/hr Intravenous Continuous Toy Baker, MD   Stopped at 11/26/19 1052  . [START ON 11/29/2019] pantoprazole (PROTONIX) injection 40 mg  40 mg Intravenous Q12H Toy Baker, MD      . Derrill Memo ON 11/27/2019] remdesivir 100 mg in sodium chloride 0.9 % 100 mL IVPB  100 mg Intravenous Daily Doutova, Anastassia, MD      . sodium chloride flush (NS) 0.9 % injection 3 mL  3 mL Intravenous Once Doutova, Anastassia, MD      . thiamine tablet 100 mg  100 mg Oral Daily Doutova, Anastassia, MD      . vancomycin (VANCOREADY) IVPB 1500 mg/300 mL  1,500 mg Intravenous Q24H Toy Baker, MD       Current Outpatient Medications  Medication Sig Dispense Refill  . acetaminophen (TYLENOL) 500 MG tablet Take 500 mg by mouth every 4 (four) hours as needed for mild pain or moderate pain.    Marland Kitchen amitriptyline (ELAVIL) 75 MG tablet Take 75 mg by mouth at bedtime.    Marland Kitchen aspirin 81 MG chewable tablet Chew 1 tablet (81 mg total) by mouth daily. 30 tablet 1  . atorvastatin (LIPITOR) 80 MG tablet Take 1 tablet (80 mg total) by mouth daily at 6 PM. 30 tablet 0  . bisacodyl (DULCOLAX) 10 MG suppository Place 1 suppository (10 mg total) rectally daily as needed for severe constipation. 20 suppository 0  . carvedilol (COREG) 6.25 MG tablet Take 1 tablet (6.25 mg total) by mouth 2 (two) times daily with a meal. 60 tablet 0  . Cholecalciferol (DIALYVITE VITAMIN D3 MAX) 25053 units TABS Take 1 tablet by mouth every 30 (thirty) days.    . clopidogrel (PLAVIX) 75 MG tablet Take 1 tablet (75 mg total) by mouth daily with breakfast. 30 tablet 2  . desvenlafaxine (PRISTIQ) 50 MG 24 hr tablet Take 50 mg by mouth daily.    Marland Kitchen gabapentin (NEURONTIN) 100 MG capsule Take 400 mg by mouth 3 (three) times daily.     Marland Kitchen ipratropium-albuterol (DUONEB) 0.5-2.5 (3) MG/3ML SOLN Take 3 mLs by nebulization 4 (four) times daily as needed (SOB).     . isosorbide mononitrate (IMDUR) 30 MG 24 hr  tablet Take 1 tablet (30 mg total) by mouth daily. 30 tablet 1  . loratadine (CLARITIN) 10 MG tablet Take 10 mg by  mouth daily.    . nitroGLYCERIN (NITROSTAT) 0.4 MG SL tablet Place 1 tablet (0.4 mg total) under the tongue every 5 (five) minutes as needed for chest pain. 30 tablet 0  . senna-docusate (SENOKOT-S) 8.6-50 MG tablet Take 2 tablets by mouth at bedtime as needed for mild constipation.     . thiamine 100 MG tablet Take 1 tablet (100 mg total) by mouth daily.    . furosemide (LASIX) 20 MG tablet Take 1 tablet (20 mg total) by mouth daily as needed for fluid or edema (weight gain 3 lbs in 24 hours or 5 lbs in 7 days.). (Patient not taking: Reported on 11/25/2019) 20 tablet      REVIEW OF SYSTEMS: I am unable to obtain a review of systems due to patient being off the floor, and his dementia but in speaking with his surrogate decision-maker, he has not complained of any gastrointestinal disturbances within the last 3-4 weeks.  She does indicate that he has lost weight however but the patient has otherwise told her he is doing just fine.  No other complaints were verbalized.     PHYSICAL EXAM:  Wt Readings from Last 3 Encounters:  10/03/18 172 lb (78 kg)  05/23/18 178 lb 3.2 oz (80.8 kg)  04/20/18 174 lb (78.9 kg)   Temp Readings from Last 3 Encounters:  11/26/19 98.6 F (37 C) (Oral)  10/03/18 (!) 97.4 F (36.3 C) (Oral)  05/23/18 (!) 97.2 F (36.2 C) (Oral)   BP Readings from Last 3 Encounters:  11/26/19 105/63  10/03/18 (!) 115/93  05/23/18 129/85   Pulse Readings from Last 3 Encounters:  11/26/19 (!) 102  10/03/18 74  05/23/18 74   Unable to assess due to encounter type   ECOG = 4  0 - Asymptomatic (Fully active, able to carry on all predisease activities without restriction)  1 - Symptomatic but completely ambulatory (Restricted in physically strenuous activity but ambulatory and able to carry out work of a light or sedentary nature. For example, light housework,  office work)  2 - Symptomatic, <50% in bed during the day (Ambulatory and capable of all self care but unable to carry out any work activities. Up and about more than 50% of waking hours)  3 - Symptomatic, >50% in bed, but not bedbound (Capable of only limited self-care, confined to bed or chair 50% or more of waking hours)  4 - Bedbound (Completely disabled. Cannot carry on any self-care. Totally confined to bed or chair)  5 - Death   Eustace Pen MM, Creech RH, Tormey DC, et al. [phone]). "Toxicity and response criteria of the [facility]". [place] Oncol. 5 (6): 649-55    LABORATORY DATA:  Lab Results  Component Value Date   WBC 19.3 (H) 11/26/2019   HGB 8.2 (L) 11/26/2019   HCT 25.4 (L) 11/26/2019   MCV 89.4 11/26/2019   PLT 320 11/26/2019   Lab Results  Component Value Date   NA 141 11/26/2019   K 3.9 11/26/2019   CL 111 11/26/2019   CO2 17 (L) 11/26/2019   Lab Results  Component Value Date   ALT 10 11/26/2019   AST 26 11/26/2019   ALKPHOS 99 11/26/2019   BILITOT 0.8 11/26/2019      RADIOGRAPHY: CT ANGIO CHEST PE W OR WO CONTRAST  Result Date: 11/26/2019 CLINICAL DATA:  Dyspnea, chronic, unclear etiology COVID positive. EXAM: CT ANGIOGRAPHY CHEST WITH CONTRAST TECHNIQUE: Multidetector CT imaging of the chest was performed  using the standard protocol during bolus administration of intravenous contrast. Multiplanar CT image reconstructions and MIPs were obtained to evaluate the vascular anatomy. CONTRAST:  174m OMNIPAQUE IOHEXOL 350 MG/ML SOLN COMPARISON:  Chest radiograph yesterday. Chest CT 10/18/2017 FINDINGS: Cardiovascular: There are no filling defects within the pulmonary arteries to suggest pulmonary embolus. Subsegmental branches are not well assessed. Aortic atherosclerosis. Cannot assess for dissection given phase of contrast bolus timing tailored to pulmonary artery evaluation. Coronary artery calcifications versus stents. Heart is normal in  size. No pericardial effusion. Mediastinum/Nodes: Small mediastinal lymph nodes, all subcentimeter short axis. Largest node is prevascular measuring 9 mm. No hilar adenopathy. No esophageal wall thickening. No visualized thyroid nodule. 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 Minimal dependent atelectasis in the left lower lobe. Calcified granuloma in the right middle lobe. No noncalcified nodules. No pulmonary mass. No focal airspace disease. No ground-glass opacity typical of COVID pneumonia. No pleural fluid. Upper Abdomen: Assessed on abdominal CT yesterday. Musculoskeletal: Expansile lytic lesion involving the right posterolateral seventh rib with bony destruction. Lytic lesion involving posterior elements of T7 extend into and may obliterate spinal canal. Lesion extends to involve the inferior aspect of T6 spinous process. L1 vertebral body lesion was assessed on abdominal CT yesterday. Review of the MIP images confirms the above findings. IMPRESSION: 1. No pulmonary embolus. 2. 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 No evidence of acute pneumonia or primary bronchogenic malignancy. 3. Expansile lytic lesions involving T7 vertebral body which likely extends to and likely obliterates the spinal canal. Destructive lytic lesion involving right posterolateral seventh rib. In conjunction with bone lesions in the lumbar spine and pelvis, findings may represent metastatic disease or multiple myeloma. 4. Aortic atherosclerosis. Coronary artery calcifications versus stents. Aortic Atherosclerosis (ICD10-I70.0) and Emphysema (ICD10-J43.9). Electronically Signed   By: MKeith RakeM.D.   On: 11/26/2019 03:59   CT Abdomen Pelvis W Contrast  Result Date: 11/26/2019 CLINICAL DATA:  GI bleed. EXAM: CT ABDOMEN AND PELVIS WITH CONTRAST TECHNIQUE: Multidetector CT imaging of the abdomen and pelvis was performed using the standard protocol following bolus administration of intravenous contrast. CONTRAST:  1076mOMNIPAQUE  IOHEXOL 300 MG/ML  SOLN COMPARISON:  Pelvis CT 09/28/2018. No prior abdominal imaging. FINDINGS: Lower chest: Breathing motion artifact. No pleural fluid. Heart is normal in size. There are coronary artery calcifications. Hepatobiliary: Small subcentimeter low-density in the central liver is too small to characterize but likely small cyst or hemangioma. Elongated gallbladder. No pericholecystic inflammation. No calcified gallstone. No biliary dilatation. Pancreas: Parenchymal atrophy. No ductal dilatation or inflammation. No evidence pancreatic mass. Spleen: Normal in size without focal abnormality. Trace perisplenic fluid. Adrenals/Urinary Tract: No adrenal nodule. Cortical scarring in the upper left kidney. No hydronephrosis. Low-density lesions in the left kidney are too small to characterize but likely cysts. No evidence of solid renal mass. Absent excretion on delayed phase imaging. Urinary bladder is distended. Mild bladder wall thickening at the base which appears similar to prior exam. The bladder appears slightly trabeculated. Stomach/Bowel: Motion and lack of enteric contrast limits gastric evaluation. There is equivocal gastric wall thickening about the proximal stomach. Small bowel is decompressed. No bowel obstruction or small bowel inflammation. Sigmoid colonic wall thickening with pericolonic edema, moderate length segment. Colonic diverticulosis without evidence of focal diverticulitis. No obvious colonic mass. The appendix is not confidently visualized. Vascular/Lymphatic: Severe aortic atherosclerosis. Advanced branch and peripheral atherosclerosis. Dense calcification at the origin of the celiac artery. Proximal mesenteric vessels are patent. Portal vein is patent. No enlarged lymph nodes in  the abdomen or pelvis. Reproductive: Prostate gland not enlarged. Other: Trace perisplenic free fluid. No free air. Musculoskeletal: Multiple lytic lesions involving the lumbar spine and bony pelvis. L1 lesion  involves the vertebral body, right pedicle and lamina with extraosseous extension, there is mass effect and invasion of the spinal canal. Pathologic fracture with infiltrated lesion of L3 vertebral body with soft tissue extension peripherally into the bilateral psoas muscles. Posterior extension into the spinal canal. Expansile lucent lesion involving the right anterior iliac bone, this is at site of previous cortical irregularity. Permeative expansile lesion involving the left posterior acetabulum and ischium. Lytic lesion involving the left pubic body. IMPRESSION: 1. Sigmoid colonic wall thickening with pericolonic edema, suspicious for colitis. This may be infectious or inflammatory, and likely cause of patient's GI bleed. Neoplastic involvement is felt less likely given length of involvement but not entirely excluded. 2. Multiple lytic lesions involving the lumbar spine and bony pelvis. Findings may represent metastatic disease or multiple myeloma. There is extraosseous extension at L1 and L3 with mass effect and invasion of the spinal canal. Expansile lytic lesions in the right iliac bone is at site of previous cortical irregularity. Permeative lytic lesion involving the posterior left acetabulum and left pubic body. Given history of emphysema, consider further evaluation with chest CT to evaluate for pulmonary mass (preferably with IV contrast to assess for hilar regions). 3. Distended urinary bladder with mild bladder wall thickening at the base and suggestion of trabeculation, similar to prior exam. 4. Equivocal gastric wall thickening about the proximal stomach. 5. Advanced aortic and branch atherosclerosis. Severe peripheral vascular disease. Aortic Atherosclerosis (ICD10-I70.0). Electronically Signed   By: Keith Rake M.D.   On: 11/26/2019 00:57   DG Chest Port 1 View  Result Date: 11/25/2019 CLINICAL DATA:  Weakness EXAM: PORTABLE CHEST 1 VIEW COMPARISON:  Radiograph 02/10/2018, CT 10/18/2017  FINDINGS: Background of diffusely coarsened interstitial changes in the lungs which may be accentuated by low lung volumes. No focal consolidative opacity. No convincing features of edema. The aorta is calcified. The remaining cardiomediastinal contours are unremarkable. No acute osseous or soft tissue abnormality. Degenerative changes are present in the imaged spine and shoulders. The osseous structures appear diffusely demineralized which may limit detection of small or nondisplaced fractures. IMPRESSION: Background of chronic interstitial changes and emphysema likely accentuated by low lung volumes. No definite acute cardiopulmonary abnormality. Aortic Atherosclerosis (ICD10-I70.0). Electronically Signed   By: Lovena Le M.D.   On: 11/25/2019 21:31       IMPRESSION/PLAN: 1. 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  I spent time in reviewing his records, coordinating with oncology and internal medicine, and speaking with the patient surrogate decision-maker Anastasia Fiedler.  The patient's CT scans are concerning for bone metastases at multiple levels in the thoracic and lumbar spine with compromise of the canal.  His situation is quite complex because of GI bleed, coronary artery disease with elevated troponin and in the setting of COVID-19.  I spent time speaking via in basket chat with internal medicine and they are in agreement to let us proceed with giving dexamethasone 4 mg twice daily.  He is in the MRI department right now having an MRI of the brain, I spoke with the MRI techs and they are in agreement to let us proceed with a total spine med screening study which hopefully would also improve our understanding of disease burden or possibly characterize  whether this looks like it could be a plasma base malignancy.  Dr. Simeon Craft such we will formally consult once his SPEP has returned which could be several  days.  In the interim, hopefully we can avoid having to react urgently, and I feel like the steroids would give [place] a flexibility to formally find out what is the process leading to the findings on imaging.  If his SPEP is negative he will need a biopsy to document malignant process prior to radiation being given.  We also appreciate the recommendations of GI as they have been consulted, and it is possible that he may have a gastrointestinal malignancy given some of his previous social history of this significant alcohol consumption.  We will follow-up with the results of his MRI and next steps after conferring with the other providers on his team. 2. Acute gastrointestinal bleed with thickening of the sigmoid colon and stomach on CT. it appears that Dr. Carlean Purl has consulted, we appreciate his recommendations.  Hopefully endoscopic procedures can be performed to better clarify if he has a gastrointestinal malignancy.  Unfortunately we do feel that he needs to give dexamethasone because of the concerns for spinal cord compression.  I discussed with his surrogate decision-maker the risks and benefits.  I will also reach out to Dr. Carlean Purl. 3. History of coronary artery disease currently with elevated troponin.  We will follow along with cardiology's recommendations.  His current regimen will continue antihypertensive as indicated, aspirin, and lipitor 4. Dementia.  The patient has had dementia for some time, he relies on [name] to help him make medical decisions.  This certainly impact how he will be able to proceed with treatment and gives consent.  He is also having an MRI this morning to rule out metastatic disease. 5. COVID-19.  The patient is currently on the MRI table but has had a difficult time with communicating with other providers who was contacted him to see the of the ER.  From what I can tell his providers who was physically assessed him feel that he i s asymptomatic.  We will follow this  expectantly throughout the course.  This also does play a major role in terms of how we can treat him.  As an example most patients who test positive for Covid if they are in the outpatient center are not able to come in for treatment until they are 21 days post diagnosis, the situation and if he indeed does have cord compression is a different scenario.  Once we have more clarity from his MRI imaging, we will have a better sense of this and whether we need to urgently proceed. 6. Goals of care.  I spent time on the phone today with [name], the patient's home health aide who has been working with him for over 13 years.  She states that he does have confusion from his dementia, but leans on her to help make him make medical decisions.  Apparently when he was residing at [place] she was also being contacted whenever there was a change in his status.  She states that the last time she spoke with him was about 2 weeks ago, unfortunately she was denied access to come and see him at Christmas because of some paperwork issue, but saw him 4 months ago.  She is in agreement that he would benefit from a goals of care discussion with palliative medicine and she would be very interested in participating in any further decisions that  are made regarding his care.  I have placed a referral to palliative medicine for this purpose as well.   In a visit lasting 90 minutes, greater than 50% of the time was by phone with [name], the patient's surrogate decision maker discussing his current situation, and in floor time coordinating the patient's care.    Carola Rhine, PAC

## 2019-11-26 NOTE — ED Notes (Signed)
Called pt's care provider Verdene Lennert. Updated on general patient status and will attempt to have a member of the rounding team update her on the specifics of the plan of care as there are many specialties involved. Welcomed the patient to call back with any clarification questions

## 2019-11-26 NOTE — Consult Note (Addendum)
Cardiology Consultation:   Patient ID: Vincent Dunlap; [id number]; Jul 19, 1941   Admit date: 11/25/2019 Date of Consult: 11/26/2019  Primary Care Provider: Hendricks Limes, MD Primary Cardiologist: Mertie Moores, MD  Primary Electrophysiologist:  Thompson Grayer, MD   Patient Profile:   Vincent Dunlap is a 79 y.o. male with a hx of HTN, HLD, CAD w/ severe 3 v dz (not surgical candidate & no PCI 2017), ICM w/ EF 30%, remote EtOH abuse, COPD, malnutrition, who is being seen today for the evaluation of elevated troponin at the request of Dr Louanne Belton.  History of Present Illness:   Vincent Dunlap was brought to the emergency day from [place] because of altered mental status and hypotension.  Systolic blood pressure was reportedly 60 at one point.  He received IV fluids with improvement.  He was complaining of abdominal pain, and a headache.  He was also complaining of weakness.  His troponin was elevated and cardiology was asked to evaluate him.  Vincent Dunlap is awake and alert but not able to tell me much.  Information was obtained from the patient, but also from chart notes and nursing staff.  Vincent Dunlap does not remember having any chest pain.  His nurse also states he has never complained of chest pain.  He describes sharp abdominal pain that radiates up into his chest into his left shoulder.  He denies shortness of breath, is not aware of any lower extremity edema, and denies orthopnea or PND.  He complains of thirst.  He states he has had some nausea, and tried to vomit twice.  He is not currently able to say why he was brought here.  Except for being thirsty, he feels all right.  However, he is very weak and is not able to move himself around in the bed very well.  COVID +, FOB+ w/ H&H 6.3/20.4, WBC 21.3, Trop peak 2,693.   Past Medical History:  Diagnosis Date  . CAD (coronary artery disease) 07/2016   3 vessel disease,  evaluated by Dr Servando Snare and felt to be a poor surgical candidate  .  Chronic systolic CHF (congestive heart failure) ([place])   . Hyperlipidemia LDL goal <70   . Hypertension   . Ischemic cardiomyopathy 2017   EF 30%  . Tobacco abuse     Past Surgical History:  Procedure Laterality Date  . ABDOMINAL AORTOGRAM W/LOWER EXTREMITY Right 07/26/2017   Procedure: ABDOMINAL AORTOGRAM W/LOWER EXTREMITY;  Surgeon: Serafina Mitchell, MD;  Location: [facility];  Service: Cardiovascular;  Laterality: Right;  . AMPUTATION Right 07/28/2017   Procedure: AMPUTATION RIGHT Second TOE;  Surgeon: Waynetta Sandy, MD;  Location: [facility];  Service: Vascular;  Laterality: Right;  . CARDIAC CATHETERIZATION N/A 07/19/2016   Procedure: Right/Left Heart Cath and Coronary Angiography;  Surgeon: Troy Sine, MD;  Location: [facility];  Service: Cardiovascular;  Laterality: N/A;  . INGUINAL HERNIA REPAIR Bilateral   . PERIPHERAL VASCULAR INTERVENTION Right 07/26/2017   Procedure: PERIPHERAL VASCULAR INTERVENTION;  Surgeon: Serafina Mitchell, MD;  Location: [facility];  Service: Cardiovascular;  Laterality: Right;     Prior to Admission medications   Medication Sig Start Date End Date Taking? Authorizing Provider  acetaminophen (TYLENOL) 500 MG tablet Take 500 mg by mouth every 4 (four) hours as needed for mild pain or moderate pain.   Yes [provider]  amitriptyline (ELAVIL) 75 MG tablet Take 75 mg by mouth at bedtime.  Yes [provider]  aspirin 81 MG chewable tablet Chew 1 tablet (81 mg total) by mouth daily. 07/31/17  Yes Barton Dubois, MD  atorvastatin (LIPITOR) 80 MG tablet Take 1 tablet (80 mg total) by mouth daily at 6 PM. 07/25/16  Yes Regalado, Belkys A, MD  bisacodyl (DULCOLAX) 10 MG suppository Place 1 suppository (10 mg total) rectally daily as needed for severe constipation. 07/30/17  Yes Barton Dubois, MD  carvedilol (COREG) 6.25 MG tablet Take 1 tablet (6.25 mg total) by mouth 2 (two) times daily with a meal. 07/25/16  Yes  Regalado, Belkys A, MD  Cholecalciferol (DIALYVITE VITAMIN D3 MAX) 96283 units TABS Take 1 tablet by mouth every 30 (thirty) days. 05/28/18  Yes [provider]  clopidogrel (PLAVIX) 75 MG tablet Take 1 tablet (75 mg total) by mouth daily with breakfast. 07/31/17  Yes Barton Dubois, MD  desvenlafaxine (PRISTIQ) 50 MG 24 hr tablet Take 50 mg by mouth daily.   Yes [provider]  gabapentin (NEURONTIN) 100 MG capsule Take 400 mg by mouth 3 (three) times daily.    Yes [provider]  ipratropium-albuterol (DUONEB) 0.5-2.5 (3) MG/3ML SOLN Take 3 mLs by nebulization 4 (four) times daily as needed (SOB).    Yes [provider]  isosorbide mononitrate (IMDUR) 30 MG 24 hr tablet Take 1 tablet (30 mg total) by mouth daily. 07/31/17  Yes Barton Dubois, MD  loratadine (CLARITIN) 10 MG tablet Take 10 mg by mouth daily.   Yes [provider]  nitroGLYCERIN (NITROSTAT) 0.4 MG SL tablet Place 1 tablet (0.4 mg total) under the tongue every 5 (five) minutes as needed for chest pain. 07/25/16  Yes Regalado, Belkys A, MD  senna-docusate (SENOKOT-S) 8.6-50 MG tablet Take 2 tablets by mouth at bedtime as needed for mild constipation.    Yes [provider]  thiamine 100 MG tablet Take 1 tablet (100 mg total) by mouth daily. 10/21/17  Yes Gherghe, Vella Redhead, MD  furosemide (LASIX) 20 MG tablet Take 1 tablet (20 mg total) by mouth daily as needed for fluid or edema (weight gain 3 lbs in 24 hours or 5 lbs in 7 days.). Patient not taking: Reported on 11/25/2019 10/03/18   Arrien, Jimmy Picket, MD    Inpatient Medications: Scheduled Meds: . amitriptyline  75 mg Oral QHS  . atorvastatin  80 mg Oral q1800  . [START ON 11/29/2019] pantoprazole  40 mg Intravenous Q12H  . sodium chloride flush  3 mL Intravenous Once  . thiamine  100 mg Oral Daily   Continuous Infusions: . ceFEPime (MAXIPIME) IV Stopped (11/26/19 6629)  . metronidazole Stopped (11/26/19 0612)  .  pantoprozole (PROTONIX) infusion Stopped (11/26/19 1052)  . [START ON 11/27/2019] remdesivir 100 mg in NS 100 mL    . vancomycin     PRN Meds: acetaminophen **OR** acetaminophen, HYDROcodone-acetaminophen, ipratropium-albuterol, ondansetron **OR** ondansetron (ZOFRAN) IV  Allergies:    Allergies  Allergen Reactions  . Chantix [Varenicline] Other (See Comments)    Pt states it made him "crazy"    Social History:   Social History   Socioeconomic History  . Marital status: Single    Spouse name: Not on file  . Number of children: Not on file  . Years of education: Not on file  . Highest education level: Not on file  Occupational History  . Not on file  Tobacco Use  . Smoking status: Former Smoker    Packs/day: 1.00    Types: Cigarettes  Quit date: 09/08/2017    Years since quitting: 2.2  . Smokeless tobacco: Former Network engineer and Sexual Activity  . Alcohol use: No    Comment: Former heavy binge drinker until 2014  . Drug use: No  . Sexual activity: Not on file  Other Topics Concern  . Not on file  Social History Narrative   Patient is a resident at [place] [facility] Strain:   . Difficulty of Paying Living Expenses: Not on file  Food Insecurity:   . Worried About Charity fundraiser in the Last Year: Not on file  . Ran Out of Food in the Last Year: Not on file  Transportation Needs:   . Lack of Transportation (Medical): Not on file  . Lack of Transportation (Non-Medical): Not on file  Physical Activity:   . Days of Exercise per Week: Not on file  . Minutes of Exercise per Session: Not on file  Stress:   . Feeling of Stress : Not on file  Social Connections:   . Frequency of Communication with Friends and Family: Not on file  . Frequency of Social Gatherings with Friends and Family: Not on file  . Attends Religious Services: Not on file  . Active Member of Clubs or Organizations: Not on file  . Attends  Archivist Meetings: Not on file  . Marital Status: Not on file  Intimate Partner Violence:   . Fear of Current or Ex-Partner: Not on file  . Emotionally Abused: Not on file  . Physically Abused: Not on file  . Sexually Abused: Not on file    Family History:   Family History  Problem Relation Age of Onset  . Alzheimer's disease Mother   . Diabetes Mother        Diabetes in the maternal side of the family but not in mother  . Heart disease Neg Hx   . Cancer Neg Hx    Family Status:  Family Status  Relation Name Status  . Mother  (Not Specified)  . Neg Hx  (Not Specified)    ROS:  Please see the history of present illness.  All other ROS reviewed and negative.     Physical Exam/Data:   Vitals:   11/26/19 0945 11/26/19 1015 11/26/19 1030 11/26/19 1045  BP: (!) 114/58 (!) 105/58 105/63   Pulse:  94    Resp: 14 14 17 17   Temp:      TempSrc:      SpO2:  100%      Intake/Output Summary (Last 24 hours) at 11/26/2019 1100 Last data filed at 11/26/2019 1049 Gross per 24 hour  Intake 415 ml  Output 1300 ml  Net -885 ml   There were no vitals filed for this visit. There is no height or weight on file to calculate BMI.  General:  Well nourished, well developed, male in no acute distress HEENT: normal Lymph: no adenopathy Neck: JVD -not elevated Endocrine:  No thryomegaly Vascular: No carotid bruits; 4/4 extremity pulses 1-2+  Cardiac:  normal S1, S2; RRR; no murmur Lungs: Decreased breath sounds bases with a few rales bilaterally, no wheezing, rhonchi  Abd: soft, + tender, no hepatomegaly  Ext: no edema Musculoskeletal:  No deformities, BUE and BLE strength weak but equal Skin: warm and dry  Neuro:  CNs 2-12 intact, no focal abnormalities noted Psych:  Normal affect   EKG:  The EKG was personally reviewed and  demonstrates: 1/18 ECG is sinus rhythm, heart rate 99, lateral T wave changes are the same or improved from 09/2018 Telemetry:  Telemetry was  personally reviewed and demonstrates: Sinus rhythm, PVCs   CV studies:   ECHO: 11/26/2019, report pending ECHO: 09/29/2018 - Left ventricle: The cavity size was normal. Wall thickness was   increased in a pattern of mild LVH. Systolic function was mildly   reduced. The estimated ejection fraction was in the range of 45%   to 50%. There is hypokinesis of the inferolateral myocardium.   Doppler parameters are consistent with abnormal left ventricular   relaxation (grade 1 diastolic dysfunction). - Aortic valve: There was trivial regurgitation. - Mitral valve: There was trivial regurgitation.  CATH: 07/19/2016  Ost RCA lesion, 95 %stenosed.  Prox RCA lesion, 100 %stenosed.  Ost LM to LM lesion, 70 %stenosed.  Ost Cx to Prox Cx lesion, 75 %stenosed.  Ost LAD to Prox LAD lesion, 70 %stenosed.  Ost 1st Diag to 1st Diag lesion, 70 %stenosed.  Ost 2nd Diag to 2nd Diag lesion, 70 %stenosed.  Ost 1st Sept to 1st Sept lesion, 90 %stenosed.  Hemodynamic findings consistent with mild pulmonary hypertension.   Severe ischemic cardiomyopathy with diffuse hypocontractility and hypo-to akinesis in the mid- basal inferior wall with a global ejection fraction of of 25-30%.  Severe multivessel CAD with 70% distal left main stenosis, 70% proximal LAD, diagonal 1 and diagonal 2 stenoses with 90% septal perforating artery stenoses; 75% proximal left circumflex stenoses, and total occlusion of the very proximal RCA with significant left to right collaterals. Diagnostic Dominance: Right    Laboratory Data:   Chemistry Recent Labs  Lab 11/25/19 2044 11/26/19 0609  NA 138 141  K 4.4 3.9  CL 108 111  CO2 16* 17*  GLUCOSE 126* 103*  BUN 91* 76*  CREATININE 1.15 0.98  CALCIUM 9.0 8.4*  GFRNONAA >60 >60  GFRAA >60 >60  ANIONGAP 14 13    Lab Results  Component Value Date   ALT 10 11/26/2019   AST 26 11/26/2019   ALKPHOS 99 11/26/2019   BILITOT 0.8 11/26/2019   Hematology Recent  Labs  Lab 11/25/19 2044 11/26/19 0724  WBC 28.3* 21.3*  RBC 2.28* 3.02*  HGB 6.3* 8.6*  HCT 20.4* 27.1*  MCV 89.5 89.7  MCH 27.6 28.5  MCHC 30.9 31.7  RDW 24.6* 19.9*  PLT 338 336   Cardiac Enzymes High Sensitivity Troponin:   Recent Labs  Lab 11/25/19 2044 11/25/19 2251 11/26/19 0609 11/26/19 0700  TROPONINIHS 134* 136* 1,767* 2,693*      BNPNo results for input(s): BNP, PROBNP in the last 168 hours.   TSH:  Lab Results  Component Value Date   TSH 3.195 11/26/2019   Lipids: Lab Results  Component Value Date   CHOL 134 04/19/2018   HDL 39 04/19/2018   LDLCALC 75 04/19/2018   TRIG 103 04/19/2018   CHOLHDL 2.5 07/17/2017   HgbA1c: Lab Results  Component Value Date   HGBA1C 5.7 04/19/2018   Magnesium:  Magnesium  Date Value Ref Range Status  11/26/2019 1.7 1.7 - 2.4 mg/dL Final    Comment:    Performed at [facility], [place] [address], [place], [place] [address]     Radiology/Studies:  CT ANGIO CHEST PE W OR WO CONTRAST  Result Date: 11/26/2019 CLINICAL DATA:  Dyspnea, chronic, unclear etiology COVID positive. EXAM: CT ANGIOGRAPHY CHEST WITH CONTRAST TECHNIQUE: Multidetector CT imaging of the chest was performed using the  standard protocol during bolus administration of intravenous contrast. Multiplanar CT image reconstructions and MIPs were obtained to evaluate the vascular anatomy. CONTRAST:  131m OMNIPAQUE IOHEXOL 350 MG/ML SOLN COMPARISON:  Chest radiograph yesterday. Chest CT 10/18/2017 FINDINGS: Cardiovascular: There are no filling defects within the pulmonary arteries to suggest pulmonary embolus. Subsegmental branches are not well assessed. Aortic atherosclerosis. Cannot assess for dissection given phase of contrast bolus timing tailored to pulmonary artery evaluation. Coronary artery calcifications versus stents. Heart is normal in size. No pericardial effusion. Mediastinum/Nodes: Small mediastinal lymph nodes, all subcentimeter short axis.  Largest node is prevascular measuring 9 mm. No hilar adenopathy. No esophageal wall thickening. No visualized thyroid nodule. 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 Minimal dependent atelectasis in the left lower lobe. Calcified granuloma in the right middle lobe. No noncalcified nodules. No pulmonary mass. No focal airspace disease. No ground-glass opacity typical of COVID pneumonia. No pleural fluid. Upper Abdomen: Assessed on abdominal CT yesterday. Musculoskeletal: Expansile lytic lesion involving the right posterolateral seventh rib with bony destruction. Lytic lesion involving posterior elements of T7 extend into and may obliterate spinal canal. Lesion extends to involve the inferior aspect of T6 spinous process. L1 vertebral body lesion was assessed on abdominal CT yesterday. Review of the MIP images confirms the above findings. IMPRESSION: 1. No pulmonary embolus. 2. 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 No evidence of acute pneumonia or primary bronchogenic malignancy. 3. Expansile lytic lesions involving T7 vertebral body which likely extends to and likely obliterates the spinal canal. Destructive lytic lesion involving right posterolateral seventh rib. In conjunction with bone lesions in the lumbar spine and pelvis, findings may represent metastatic disease or multiple myeloma. 4. Aortic atherosclerosis. Coronary artery calcifications versus stents. Aortic Atherosclerosis (ICD10-I70.0) and Emphysema (ICD10-J43.9). Electronically Signed   By: MKeith RakeM.D.   On: 11/26/2019 03:59   CT Abdomen Pelvis W Contrast  Result Date: 11/26/2019 CLINICAL DATA:  GI bleed. EXAM: CT ABDOMEN AND PELVIS WITH CONTRAST TECHNIQUE: Multidetector CT imaging of the abdomen and pelvis was performed using the standard protocol following bolus administration of intravenous contrast. CONTRAST:  1083mOMNIPAQUE IOHEXOL 300 MG/ML  SOLN COMPARISON:  Pelvis CT 09/28/2018. No prior abdominal imaging. FINDINGS: Lower chest:  Breathing motion artifact. No pleural fluid. Heart is normal in size. There are coronary artery calcifications. Hepatobiliary: Small subcentimeter low-density in the central liver is too small to characterize but likely small cyst or hemangioma. Elongated gallbladder. No pericholecystic inflammation. No calcified gallstone. No biliary dilatation. Pancreas: Parenchymal atrophy. No ductal dilatation or inflammation. No evidence pancreatic mass. Spleen: Normal in size without focal abnormality. Trace perisplenic fluid. Adrenals/Urinary Tract: No adrenal nodule. Cortical scarring in the upper left kidney. No hydronephrosis. Low-density lesions in the left kidney are too small to characterize but likely cysts. No evidence of solid renal mass. Absent excretion on delayed phase imaging. Urinary bladder is distended. Mild bladder wall thickening at the base which appears similar to prior exam. The bladder appears slightly trabeculated. Stomach/Bowel: Motion and lack of enteric contrast limits gastric evaluation. There is equivocal gastric wall thickening about the proximal stomach. Small bowel is decompressed. No bowel obstruction or small bowel inflammation. Sigmoid colonic wall thickening with pericolonic edema, moderate length segment. Colonic diverticulosis without evidence of focal diverticulitis. No obvious colonic mass. The appendix is not confidently visualized. Vascular/Lymphatic: Severe aortic atherosclerosis. Advanced branch and peripheral atherosclerosis. Dense calcification at the origin of the celiac artery. Proximal mesenteric vessels are patent. Portal vein is patent. No enlarged lymph nodes in the abdomen  or pelvis. Reproductive: Prostate gland not enlarged. Other: Trace perisplenic free fluid. No free air. Musculoskeletal: Multiple lytic lesions involving the lumbar spine and bony pelvis. L1 lesion involves the vertebral body, right pedicle and lamina with extraosseous extension, there is mass effect and  invasion of the spinal canal. Pathologic fracture with infiltrated lesion of L3 vertebral body with soft tissue extension peripherally into the bilateral psoas muscles. Posterior extension into the spinal canal. Expansile lucent lesion involving the right anterior iliac bone, this is at site of previous cortical irregularity. Permeative expansile lesion involving the left posterior acetabulum and ischium. Lytic lesion involving the left pubic body. IMPRESSION: 1. Sigmoid colonic wall thickening with pericolonic edema, suspicious for colitis. This may be infectious or inflammatory, and likely cause of patient's GI bleed. Neoplastic involvement is felt less likely given length of involvement but not entirely excluded. 2. Multiple lytic lesions involving the lumbar spine and bony pelvis. Findings may represent metastatic disease or multiple myeloma. There is extraosseous extension at L1 and L3 with mass effect and invasion of the spinal canal. Expansile lytic lesions in the right iliac bone is at site of previous cortical irregularity. Permeative lytic lesion involving the posterior left acetabulum and left pubic body. Given history of emphysema, consider further evaluation with chest CT to evaluate for pulmonary mass (preferably with IV contrast to assess for hilar regions). 3. Distended urinary bladder with mild bladder wall thickening at the base and suggestion of trabeculation, similar to prior exam. 4. Equivocal gastric wall thickening about the proximal stomach. 5. Advanced aortic and branch atherosclerosis. Severe peripheral vascular disease. Aortic Atherosclerosis (ICD10-I70.0). Electronically Signed   By: Keith Rake M.D.   On: 11/26/2019 00:57   DG Chest Port 1 View  Result Date: 11/25/2019 CLINICAL DATA:  Weakness EXAM: PORTABLE CHEST 1 VIEW COMPARISON:  Radiograph 02/10/2018, CT 10/18/2017 FINDINGS: Background of diffusely coarsened interstitial changes in the lungs which may be accentuated by low  lung volumes. No focal consolidative opacity. No convincing features of edema. The aorta is calcified. The remaining cardiomediastinal contours are unremarkable. No acute osseous or soft tissue abnormality. Degenerative changes are present in the imaged spine and shoulders. The osseous structures appear diffusely demineralized which may limit detection of small or nondisplaced fractures. IMPRESSION: Background of chronic interstitial changes and emphysema likely accentuated by low lung volumes. No definite acute cardiopulmonary abnormality. Aortic Atherosclerosis (ICD10-I70.0). Electronically Signed   By: Lovena Le M.D.   On: 11/25/2019 21:31    Assessment and Plan:   1. Elevated troponin - although higher than expected, could be demand ischemia in a pt w/ acute illness and known 3 v CAD. - no reports of chest pain - continue CAD rx as BP will tolerate, including ASA, Lipitor, BB, Imdur - f/u on echo results - MD advise if any further eval indicated.  2.  AMS, acute illness, acute anemia, COVID and other issues - per IM  Active Problems:   Essential hypertension   CAD (coronary artery disease), native coronary artery   Chronic pain syndrome   COPD GOLD II   Sepsis (HCC)   Hypotension   Microcytic anemia   Acute metabolic encephalopathy   Acute kidney injury ([place])   Upper GI bleed   Elevated troponin   Leukocytosis   GI bleed     For questions or updates, please contact [facility] Please consult [email or web address] for contact info under Cardiology/STEMI.   Signed, Rosaria Ferries, PA-C  11/26/2019 11:00 AM  I have seen and  examined the patient along with Rosaria Ferries, PA-C .  I have reviewed the chart, notes and new data.  I agree with PA/NP's note.  Key new complaints: he is awake and appears comfortable, but is unable to provide history. Key examination changes: no signs of CHF, able to lie flat Key new findings / data: echo is uninterpretable. ECG shows chronic ST-T  changes inferolaterally. Troponin shows a sharp (but relatively small) increase from 130 to 2700. Hgb 6.3. Hemoccult positive.  PLAN: Vincent Dunlap does not report angina or CHF symptoms. He has extensive CAD, including an occluded RCA with collateral filling. It is not at all a surprise that his cardiac enzymes are elevated after an episode of profound hypotension and severe anemia. Findings are consistent with demand myocardial infarction, not a true atherothrombotic event. Evidence of GI blood loss. Anticoagulation benefit is exceeded by the risk. Resume carvedilol as BP allows. Continue statin. Resume antiplatelet therapy if no evidence of ongoing bleeding. He is not a candidate for invasive cardiac workup.  Sanda Klein, MD, [place] [phone] 11/26/2019, 11:31 AM

## 2019-11-26 NOTE — ED Notes (Signed)
Dr. Louanne Belton at bedside

## 2019-11-26 NOTE — Addendum Note (Signed)
Encounter addended by: Hayden Pedro, PA-C on: 11/26/2019 1:18 PM  Actions taken: Level of Service modified

## 2019-11-26 NOTE — Progress Notes (Signed)
PROGRESS NOTE  Vincent Dunlap ACZ:[id number] DOB: 1941/04/29 DOA: 11/25/2019 PCP: Hendricks Limes, MD   LOS: 1 day   Brief narrative: As per HPI,  EDWORD CU is a 79 y.o. 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 EMS was called noted that systolic blood pressure was down to 60s given 500 mL bolus and blood pressure went up to 80s by time patient arrived to emergency department. In ED, patient  was alert and oriented x 3 but disoriented to situation. She could not provide detailed history but denied any chest pain, shortness of breath, abdominal pain. Patient at baseline unable to ambulate. Patient has advanced COPD and continue to be an active smoker until she was admitted to SNF. Prior to admission to SNF patient was drinking 6-7 beers a day. Patient has known history of anemia and been getting B12 and iron supplements. 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 She has known history of chronic elbow wound.  Assessment/Plan:  Active Problems:   Essential hypertension   CAD (coronary artery disease), native coronary artery   Chronic pain syndrome   COPD GOLD II   Sepsis (HCC)   Hypotension   Microcytic anemia   Acute metabolic encephalopathy   Acute kidney injury ([place])   Upper GI bleed   Elevated troponin   Leukocytosis   GI bleed  Upper GI bleed.  Hemoglobin of 6.3.  Hemoccult positive.  Patient has been admitted to stepdown unit.  GI has been consulted will follow the patient.  Continue Protonix drip and will also be on steroids..  Transfuse for hemoglobin 6.3.  Plan for 2 units of PRBC transfusion.  Elevated lactate likely secondary to GI bleed and hypotension.   Will closely monitor.  Volume resuscitate.  INR elevated at 2.1 from 11/25/2019.  Essential hypertension -patient is borderline hypotensive.  Hold p.o. medications.  Elevated troponin.  History of CAD/history of coronary artery disease.  Not a candidate for surgical intervention as per previous assessment. No chest pain, no EKG changes reported. In the setting of anemia and hypotension,  likely due to demand ischemia. Monitor on telemetry. Will discuss with cardiology due to significant elevation in troponin noted . Trend [id number].  Cardiology notified from cardio master.  Check 2D echocardiogram.  Chronic pain syndrome -we will closely monitor  COPD GOLD II -continue inhalers oxygen, nebulizers.  Possible sepsis.  WBC at 19.3.  Covid positive, possibility of colitis as well.  Patient had elevated lactic acid, white blood cell count and hypotension. Follow blood cultures for now, broad-spectrum antibiotics for now.  Blood cultures negative in less than 12 hours.  Latest lactate of 2.2.   Covid infection -obtain inflammatory markers-pending.  Patient at this point not hypoxic.  Continue remdesivir.  Patient will be on steroids for possible spinal cord compression.  COVID-19 Labs  No results for input(s): DDIMER, FERRITIN, LDH, CRP in the last 72 hours.  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 -Oncology has been notified.  Might need palliative radiation.  Continue IV steroids.  Continue PPI.  Will follow oncology recommendation.  Possibility of lung cancer with metastasis.   Patient refused MRI of the spine.  SPEP pending.  Radiation oncology on board.   Possible colitis treat  with IV antibiotics, appreciate GI input   Hypotension -in the setting of GI bleed, will volume resuscitate.  Hold antihypertensives for now.     Microcytic  anemia -chronic but currently worse from baseline likely secondary to GI bleed.   Acute metabolic encephalopathy -MRI of the brain ordered to rule out metastatic disease.  Patient passed swallow screen.  We will put her on some oral diet.  Dementia.  Continue supportive care.    VTE Prophylaxis: SCD  Code Status:  DNR  Family Communication:  Spoke with the patient's caregiver Ms. Veronica on the phone and updated her about the clinical condition of the patient.  Disposition Plan:   Uncertain at this time.  Patient is from [facility]. [name], is the caretaker for the patient for over 16 years.  She is interested in healthcare power of attorney for the patient at this time.  I have expressed to her that patient might benefit from palliative care/hospice level of care.  Palliative care has been consulted at this time.  Consult transition of care.  Consultants:  GI  Oncology  Procedures:  Transfusion of packed RBC  Antibiotics:  Anti-infectives (From admission, onward)   Start     Dose/Rate Route Frequency Ordered Stop   11/27/19 1000  remdesivir 100 mg in sodium chloride 0.9 % 100 mL IVPB     100 mg 200 mL/hr over 30 Minutes Intravenous Daily 11/26/19 0035 12/01/19 0959   11/26/19 1800  vancomycin (VANCOREADY) IVPB 1500 mg/300 mL     1,500 mg 150 mL/hr over 120 Minutes Intravenous Every 24 hours 11/25/19 2352     11/26/19 0800  ceFEPIme (MAXIPIME) 2 g in sodium chloride 0.9 % 100 mL IVPB     2 g 200 mL/hr over 30 Minutes Intravenous Every 8 hours 11/25/19 2352     11/26/19 0115  remdesivir 200 mg in sodium chloride 0.9% 250 mL IVPB     200 mg 580 mL/hr over 30 Minutes Intravenous Once 11/26/19 0035 11/26/19 0214   11/25/19 2359  vancomycin (VANCOREADY) IVPB 1500 mg/300 mL     1,500 mg 150 mL/hr over 120 Minutes Intravenous  Once 11/25/19 2334 11/26/19 0435   11/25/19 2345  ceFEPIme (MAXIPIME) 2 g in sodium chloride 0.9 % 100 mL IVPB     2 g 200 mL/hr  over 30 Minutes Intravenous  Once 11/25/19 2334 11/26/19 0116   11/25/19 2345  metroNIDAZOLE (FLAGYL) IVPB 500 mg     500 mg 100 mL/hr over 60 Minutes Intravenous Every 8 hours 11/25/19 2334       Subjective: Today, patient is a poor historian.  Complains of mild epigastric discomfort.  Objective: Vitals:   11/26/19 0645 11/26/19 0700  BP: (!) 103/57 (!) 112/58  Pulse:    Resp: 15 15  Temp:    SpO2:      Intake/Output Summary (Last 24 hours) at 11/26/2019 0715 Last data filed at 11/25/2019 2307 Gross per 24 hour  Intake 315 ml  Output --  Net 315 ml   There were no vitals filed for this visit. There is no height or weight on file to calculate BMI.   Physical Exam:  GENERAL: Patient is alert awake but poor historian.  Not in obvious distress. Thinly built.  Frail appearing.  Chronically ill.  Difficulty comprehending speech.  Has baseline dementia. HENT: Scleral pallor is noted.. Pupils equally reactive to light. Oral mucosa is moist NECK: is supple, no palpable thyroid enlargement. CHEST: Clear to auscultation.  Diminished breath sounds bilaterally. CVS: S1 and S2 heard, no murmur. Regular rate and rhythm. No pericardial rub. ABDOMEN: Soft, non-tender, bowel sounds are present. EXTREMITIES: Moves all extremities. CNS: Moving all extremities.  Has baseline dementia.  Answering few questions.  Disoriented SKIN: warm and dry without rashes.  Data Review: I have personally reviewed the following laboratory data and studies,  CBC: Recent Labs  Lab 11/25/19 2044  WBC 28.3*  HGB 6.3*  HCT 20.4*  MCV 89.5  PLT 188   Basic Metabolic Panel: Recent Labs  Lab 11/25/19 2044  NA 138  K 4.4  CL 108  CO2 16*  GLUCOSE 126*  BUN 91*  CREATININE 1.15  CALCIUM 9.0   Liver Function Tests: Recent Labs  Lab 11/25/19 2044  AST 18  ALT 10  ALKPHOS 102  BILITOT 0.7  PROT 5.4*  ALBUMIN 2.4*   No results for input(s): LIPASE, AMYLASE in the last 168 hours. No results  for input(s): AMMONIA in the last 168 hours. Cardiac Enzymes: No results for input(s): CKTOTAL, CKMB, CKMBINDEX, TROPONINI in the last 168 hours. BNP (last 3 results) No results for input(s): BNP in the last 8760 hours.  ProBNP (last 3 results) No results for input(s): PROBNP in the last 8760 hours.  CBG: Recent Labs  Lab 11/25/19 2048  GLUCAP 116*   Recent Results (from the past 240 hour(s))  Respiratory Panel by RT PCR (Flu A&B, Covid) - Nasopharyngeal Swab     Status: Abnormal   Collection Time: 11/25/19 10:13 PM   Specimen: Nasopharyngeal Swab  Result Value Ref Range Status   SARS Coronavirus 2 by RT PCR POSITIVE (A) NEGATIVE Final    Comment: RESULT CALLED TO, READ BACK BY AND VERIFIED WITH: B. ORORIO,RN 2348 11/25/2019 T. TYSOR (NOTE) SARS-CoV-2 target nucleic acids are DETECTED. SARS-CoV-2 RNA is generally detectable in upper respiratory specimens  during the acute phase of infection. Positive results are indicative of the presence of the identified virus, but do not rule out bacterial infection or co-infection with other pathogens not detected by the test. Clinical correlation with patient history and other diagnostic information is necessary to determine patient infection status. The expected result is Negative. Fact Sheet for Patients:  PinkCheek.be Fact Sheet for Healthcare Providers: GravelBags.it This test is not yet approved or cleared by the [place] FDA and  has been authorized for detection and/or diagnosis of SARS-CoV-2 by FDA under an Emergency Use Authorization (EUA).  This EUA will remain in effect (meaning this test can be used)  for the duration of  the COVID-19 declaration under Section 564(b)(1) of the Act, 21 U.S.C. section 360bbb-3(b)(1), unless the authorization is terminated or revoked sooner.    Influenza A by PCR NEGATIVE NEGATIVE Final   Influenza B by PCR NEGATIVE NEGATIVE Final     Comment: (NOTE) The Xpert Xpress SARS-CoV-2/FLU/RSV assay is intended as an aid in  the diagnosis of influenza from Nasopharyngeal swab specimens and  should not be used as a sole basis for treatment. Nasal washings and  aspirates are unacceptable for Xpert Xpress SARS-CoV-2/FLU/RSV  testing. Fact Sheet for Patients: PinkCheek.be Fact Sheet for Healthcare Providers: GravelBags.it This test is not yet approved or cleared by the [place] FDA and  has been authorized for detection and/or diagnosis of SARS-CoV-2 by  FDA under an Emergency  Use Authorization (EUA). This EUA will remain  in effect (meaning this test can be used) for the duration of the  Covid-19 declaration under Section 564(b)(1) of the Act, 21  U.S.C. section 360bbb-3(b)(1), unless the authorization is  terminated or revoked. Performed at [facility], [place] [address], [place], [place] [address]      Studies: CT ANGIO CHEST PE W OR WO CONTRAST  Result Date: 11/26/2019 CLINICAL DATA:  Dyspnea, chronic, unclear etiology COVID positive. EXAM: CT ANGIOGRAPHY CHEST WITH CONTRAST TECHNIQUE: Multidetector CT imaging of the chest was performed using the standard protocol during bolus administration of intravenous contrast. Multiplanar CT image reconstructions and MIPs were obtained to evaluate the vascular anatomy. CONTRAST:  155m OMNIPAQUE IOHEXOL 350 MG/ML SOLN COMPARISON:  Chest radiograph yesterday. Chest CT 10/18/2017 FINDINGS: Cardiovascular: There are no filling defects within the pulmonary arteries to suggest pulmonary embolus. Subsegmental branches are not well assessed. Aortic atherosclerosis. Cannot assess for dissection given phase of contrast bolus timing tailored to pulmonary artery evaluation. Coronary artery calcifications versus stents. Heart is normal in size. No pericardial effusion. Mediastinum/Nodes: Small mediastinal lymph nodes, all  subcentimeter short axis. Largest node is prevascular measuring 9 mm. No hilar adenopathy. No esophageal wall thickening. No visualized thyroid nodule. 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 Minimal dependent atelectasis in the left lower lobe. Calcified granuloma in the right middle lobe. No noncalcified nodules. No pulmonary mass. No focal airspace disease. No ground-glass opacity typical of COVID pneumonia. No pleural fluid. Upper Abdomen: Assessed on abdominal CT yesterday. Musculoskeletal: Expansile lytic lesion involving the right posterolateral seventh rib with bony destruction. Lytic lesion involving posterior elements of T7 extend into and may obliterate spinal canal. Lesion extends to involve the inferior aspect of T6 spinous process. L1 vertebral body lesion was assessed on abdominal CT yesterday. Review of the MIP images confirms the above findings. IMPRESSION: 1. No pulmonary embolus. 2. 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 No evidence of acute pneumonia or primary bronchogenic malignancy. 3. Expansile lytic lesions involving T7 vertebral body which likely extends to and likely obliterates the spinal canal. Destructive lytic lesion involving right posterolateral seventh rib. In conjunction with bone lesions in the lumbar spine and pelvis, findings may represent metastatic disease or multiple myeloma. 4. Aortic atherosclerosis. Coronary artery calcifications versus stents. Aortic Atherosclerosis (ICD10-I70.0) and Emphysema (ICD10-J43.9). Electronically Signed   By: MKeith RakeM.D.   On: 11/26/2019 03:59   CT Abdomen Pelvis W Contrast  Result Date: 11/26/2019 CLINICAL DATA:  GI bleed. EXAM: CT ABDOMEN AND PELVIS WITH CONTRAST TECHNIQUE: Multidetector CT imaging of the abdomen and pelvis was performed using the standard protocol following bolus administration of intravenous contrast. CONTRAST:  1070mOMNIPAQUE IOHEXOL 300 MG/ML  SOLN COMPARISON:  Pelvis CT 09/28/2018. No prior abdominal  imaging. FINDINGS: Lower chest: Breathing motion artifact. No pleural fluid. Heart is normal in size. There are coronary artery calcifications. Hepatobiliary: Small subcentimeter low-density in the central liver is too small to characterize but likely small cyst or hemangioma. Elongated gallbladder. No pericholecystic inflammation. No calcified gallstone. No biliary dilatation. Pancreas: Parenchymal atrophy. No ductal dilatation or inflammation. No evidence pancreatic mass. Spleen: Normal in size without focal abnormality. Trace perisplenic fluid. Adrenals/Urinary Tract: No adrenal nodule. Cortical scarring in the upper left kidney. No hydronephrosis. Low-density lesions in the left kidney are too small to characterize but likely cysts. No evidence of solid renal mass. Absent excretion on delayed phase imaging. Urinary bladder is distended. Mild bladder wall thickening at the base which appears similar  to prior exam. The bladder appears slightly trabeculated. Stomach/Bowel: Motion and lack of enteric contrast limits gastric evaluation. There is equivocal gastric wall thickening about the proximal stomach. Small bowel is decompressed. No bowel obstruction or small bowel inflammation. Sigmoid colonic wall thickening with pericolonic edema, moderate length segment. Colonic diverticulosis without evidence of focal diverticulitis. No obvious colonic mass. The appendix is not confidently visualized. Vascular/Lymphatic: Severe aortic atherosclerosis. Advanced branch and peripheral atherosclerosis. Dense calcification at the origin of the celiac artery. Proximal mesenteric vessels are patent. Portal vein is patent. No enlarged lymph nodes in the abdomen or pelvis. Reproductive: Prostate gland not enlarged. Other: Trace perisplenic free fluid. No free air. Musculoskeletal: Multiple lytic lesions involving the lumbar spine and bony pelvis. L1 lesion involves the vertebral body, right pedicle and lamina with extraosseous  extension, there is mass effect and invasion of the spinal canal. Pathologic fracture with infiltrated lesion of L3 vertebral body with soft tissue extension peripherally into the bilateral psoas muscles. Posterior extension into the spinal canal. Expansile lucent lesion involving the right anterior iliac bone, this is at site of previous cortical irregularity. Permeative expansile lesion involving the left posterior acetabulum and ischium. Lytic lesion involving the left pubic body. IMPRESSION: 1. Sigmoid colonic wall thickening with pericolonic edema, suspicious for colitis. This may be infectious or inflammatory, and likely cause of patient's GI bleed. Neoplastic involvement is felt less likely given length of involvement but not entirely excluded. 2. Multiple lytic lesions involving the lumbar spine and bony pelvis. Findings may represent metastatic disease or multiple myeloma. There is extraosseous extension at L1 and L3 with mass effect and invasion of the spinal canal. Expansile lytic lesions in the right iliac bone is at site of previous cortical irregularity. Permeative lytic lesion involving the posterior left acetabulum and left pubic body. Given history of emphysema, consider further evaluation with chest CT to evaluate for pulmonary mass (preferably with IV contrast to assess for hilar regions). 3. Distended urinary bladder with mild bladder wall thickening at the base and suggestion of trabeculation, similar to prior exam. 4. Equivocal gastric wall thickening about the proximal stomach. 5. Advanced aortic and branch atherosclerosis. Severe peripheral vascular disease. Aortic Atherosclerosis (ICD10-I70.0). Electronically Signed   By: Keith Rake M.D.   On: 11/26/2019 00:57   DG Chest Port 1 View  Result Date: 11/25/2019 CLINICAL DATA:  Weakness EXAM: PORTABLE CHEST 1 VIEW COMPARISON:  Radiograph 02/10/2018, CT 10/18/2017 FINDINGS: Background of diffusely coarsened interstitial changes in the  lungs which may be accentuated by low lung volumes. No focal consolidative opacity. No convincing features of edema. The aorta is calcified. The remaining cardiomediastinal contours are unremarkable. No acute osseous or soft tissue abnormality. Degenerative changes are present in the imaged spine and shoulders. The osseous structures appear diffusely demineralized which may limit detection of small or nondisplaced fractures. IMPRESSION: Background of chronic interstitial changes and emphysema likely accentuated by low lung volumes. No definite acute cardiopulmonary abnormality. Aortic Atherosclerosis (ICD10-I70.0). Electronically Signed   By: Lovena Le M.D.   On: 11/25/2019 21:31    Scheduled Meds:  amitriptyline  75 mg Oral QHS   atorvastatin  80 mg Oral q1800   [START ON 11/29/2019] pantoprazole  40 mg Intravenous Q12H   sodium chloride flush  3 mL Intravenous Once   thiamine  100 mg Oral Daily    Continuous Infusions:  sodium chloride 50 mL/hr at 11/26/19 0518   ceFEPime (MAXIPIME) IV     metronidazole Stopped (11/26/19 0612)  pantoprozole (PROTONIX) infusion 8 mg/hr (11/26/19 0444)   [START ON 11/27/2019] remdesivir 100 mg in NS 100 mL     vancomycin       Flora Lipps, MD  [facility] 11/26/2019

## 2019-11-26 NOTE — ED Notes (Signed)
Dr. Louanne Belton aware of latest troponin value

## 2019-11-26 NOTE — ED Notes (Signed)
Paged Dr. Louanne Belton regarding elevated troponin

## 2019-11-26 NOTE — ED Notes (Signed)
Gave report to Andee Poles, RN on 2W; [employer], room is not clean yet - will call when ready

## 2019-11-26 NOTE — ED Notes (Signed)
Rosaria Ferries, PA, Cardiology, at bedside

## 2019-11-26 NOTE — Consult Note (Signed)
Consultation  Referring Provider:     [facility] Primary Care Physician:  Hendricks Limes, MD Primary Gastroenterologist:        none Reason for Consultation:     GI bleed     Impression / Plan:  IMPRESSION Melena and presumed UGI bleed Left-sided colitis by CT - suspect that would be ischemic in origin and so could the upper GI bleeding Covid + + troponin, inoperable 3 vv CAD Dementia ? 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 male admitted w/ hypotension, anemia, altered mental status  Possible sepsis and melena. 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 His hx is unreliable due to dementia.Plavix and ASA on admit  Hgb was 6.3 - we do not have a recent baseline. Since ED arrival and admission he has been fluid resuscitated, he has had CT scanning,, antibiotics and received 2 U RBC's . He is resting comfortably and denies pain.  He has been seen by cardiology, Rad Onc, and Med Onc. He has 3 vv CAD and is not an operative candidate (x years)  He will start Decadron  Cannot have XRT at this time due to Covid +    Past Medical History:  Diagnosis Date  . CAD (coronary artery disease) 07/2016   3 vessel disease,  evaluated by Dr Servando Snare and felt to be a poor surgical candidate  . Chronic systolic CHF (congestive heart failure) ([place])   . Hyperlipidemia  LDL goal <70   . Hypertension   . Ischemic cardiomyopathy 2017   EF 30%  . Tobacco abuse     Past Surgical History:  Procedure Laterality Date  . ABDOMINAL AORTOGRAM W/LOWER EXTREMITY Right 07/26/2017   Procedure: ABDOMINAL AORTOGRAM W/LOWER EXTREMITY;  Surgeon: Serafina Mitchell, MD;  Location: [place] [facility];  Service: Cardiovascular;  Laterality: Right;  . AMPUTATION Right 07/28/2017   Procedure: AMPUTATION RIGHT Second TOE;  Surgeon: Waynetta Sandy, MD;  Location: [place];  Service: Vascular;  Laterality: Right;  . CARDIAC CATHETERIZATION N/A 07/19/2016   Procedure: Right/Left Heart Cath and Coronary Angiography;  Surgeon: Troy Sine, MD;  Location: [place] [facility];  Service: Cardiovascular;  Laterality: N/A;  . INGUINAL HERNIA REPAIR Bilateral   . PERIPHERAL VASCULAR INTERVENTION Right 07/26/2017   Procedure: PERIPHERAL VASCULAR INTERVENTION;  Surgeon: Serafina Mitchell, MD;  Location: [place] [facility];  Service: Cardiovascular;  Laterality: Right;    Family History  Problem Relation Age of Onset  . Alzheimer's disease Mother   . Diabetes Mother        Diabetes in the maternal side of the family but not in mother  . Heart disease Neg Hx   . Cancer Neg Hx      Social History   Tobacco  Use  . Smoking status: Former Smoker    Packs/day: 1.00    Types: Cigarettes    Quit date: 09/08/2017    Years since quitting: 2.2  . Smokeless tobacco: Former Network engineer Use Topics  . Alcohol use: No    Comment: Former heavy binge drinker until 2014  . Drug use: No   Social History   Social History Narrative   Patient is a resident at [facility]   He is single    Prior to Admission medications   Medication Sig Start Date End Date Taking? Authorizing Provider  acetaminophen (TYLENOL) 500 MG tablet Take 500 mg by mouth every 4 (four) hours as needed for mild pain or moderate pain.   Yes [provider]  amitriptyline (ELAVIL) 75 MG tablet Take 75  mg by mouth at bedtime.   Yes [provider]  aspirin 81 MG chewable tablet Chew 1 tablet (81 mg total) by mouth daily. 07/31/17  Yes Barton Dubois, MD  atorvastatin (LIPITOR) 80 MG tablet Take 1 tablet (80 mg total) by mouth daily at 6 PM. 07/25/16  Yes Regalado, Belkys A, MD  bisacodyl (DULCOLAX) 10 MG suppository Place 1 suppository (10 mg total) rectally daily as needed for severe constipation. 07/30/17  Yes Barton Dubois, MD  carvedilol (COREG) 6.25 MG tablet Take 1 tablet (6.25 mg total) by mouth 2 (two) times daily with a meal. 07/25/16  Yes Regalado, Belkys A, MD  Cholecalciferol (DIALYVITE VITAMIN D3 MAX) 50354 units TABS Take 1 tablet by mouth every 30 (thirty) days. 05/28/18  Yes [provider]  clopidogrel (PLAVIX) 75 MG tablet Take 1 tablet (75 mg total) by mouth daily with breakfast. 07/31/17  Yes Barton Dubois, MD  desvenlafaxine (PRISTIQ) 50 MG 24 hr tablet Take 50 mg by mouth daily.   Yes [provider]  gabapentin (NEURONTIN) 100 MG capsule Take 400 mg by mouth 3 (three) times daily.    Yes [provider]  ipratropium-albuterol (DUONEB) 0.5-2.5 (3) MG/3ML SOLN Take 3 mLs by nebulization 4 (four) times daily as needed (SOB).    Yes [provider]  isosorbide mononitrate (IMDUR) 30 MG 24 hr tablet Take 1 tablet (30 mg total) by mouth daily. 07/31/17  Yes Barton Dubois, MD  loratadine (CLARITIN) 10 MG tablet Take 10 mg by mouth daily.   Yes [provider]  nitroGLYCERIN (NITROSTAT) 0.4 MG SL tablet Place 1 tablet (0.4 mg total) under the tongue every 5 (five) minutes as needed for chest pain. 07/25/16  Yes Regalado, Belkys A, MD  senna-docusate (SENOKOT-S) 8.6-50 MG tablet Take 2 tablets by mouth at bedtime as needed for mild constipation.    Yes [provider]  thiamine 100 MG tablet Take 1 tablet (100 mg total) by mouth daily. 10/21/17  Yes Gherghe, Vella Redhead, MD  furosemide (LASIX) 20 MG tablet Take 1 tablet (20 mg  total) by mouth daily as needed for fluid or edema (weight gain 3 lbs in 24 hours or 5 lbs in 7 days.). Patient not taking: Reported on 11/25/2019 10/03/18   Arrien, Jimmy Picket, MD    Current Facility-Administered Medications  Medication Dose Route Frequency Provider Last Rate Last Admin  . acetaminophen (TYLENOL) tablet 650 mg  650 mg Oral Q6H PRN Toy Baker, MD       Or  . acetaminophen (TYLENOL) suppository 650 mg  650 mg Rectal Q6H PRN Doutova, Anastassia, MD      . amitriptyline (ELAVIL) tablet 75 mg  75  mg Oral QHS Toy Baker, MD   75 mg at 11/26/19 0142  . atorvastatin (LIPITOR) tablet 80 mg  80 mg Oral q1800 Doutova, Anastassia, MD      . ceFEPIme (MAXIPIME) 2 g in sodium chloride 0.9 % 100 mL IVPB  2 g Intravenous Q8H Toy Baker, MD   Stopped at 11/26/19 9562  . HYDROcodone-acetaminophen (NORCO/VICODIN) 5-325 MG per tablet 1-2 tablet  1-2 tablet Oral Q4H PRN Doutova, Anastassia, MD      . ipratropium-albuterol (DUONEB) 0.5-2.5 (3) MG/3ML nebulizer solution 3 mL  3 mL Nebulization QID PRN Doutova, Anastassia, MD      . metroNIDAZOLE (FLAGYL) IVPB 500 mg  500 mg Intravenous Q8H Toy Baker, MD   Stopped at 11/26/19 0612  . ondansetron (ZOFRAN) tablet 4 mg  4 mg Oral Q6H PRN Toy Baker, MD       Or  . ondansetron (ZOFRAN) injection 4 mg  4 mg Intravenous Q6H PRN Doutova, Anastassia, MD      . pantoprazole (PROTONIX) 80 mg in sodium chloride 0.9 % 250 mL (0.32 mg/mL) infusion  8 mg/hr Intravenous Continuous Toy Baker, MD   Stopped at 11/26/19 1052  . [START ON 11/29/2019] pantoprazole (PROTONIX) injection 40 mg  40 mg Intravenous Q12H Toy Baker, MD      . Derrill Memo ON 11/27/2019] remdesivir 100 mg in sodium chloride 0.9 % 100 mL IVPB  100 mg Intravenous Daily Doutova, Anastassia, MD      . sodium chloride flush (NS) 0.9 % injection 3 mL  3 mL Intravenous Once Doutova, Anastassia, MD      . thiamine tablet 100 mg  100 mg Oral  Daily Doutova, Anastassia, MD      . vancomycin (VANCOREADY) IVPB 1500 mg/300 mL  1,500 mg Intravenous Q24H Toy Baker, MD       Current Outpatient Medications  Medication Sig Dispense Refill  . acetaminophen (TYLENOL) 500 MG tablet Take 500 mg by mouth every 4 (four) hours as needed for mild pain or moderate pain.    Marland Kitchen amitriptyline (ELAVIL) 75 MG tablet Take 75 mg by mouth at bedtime.    Marland Kitchen aspirin 81 MG chewable tablet Chew 1 tablet (81 mg total) by mouth daily. 30 tablet 1  . atorvastatin (LIPITOR) 80 MG tablet Take 1 tablet (80 mg total) by mouth daily at 6 PM. 30 tablet 0  . bisacodyl (DULCOLAX) 10 MG suppository Place 1 suppository (10 mg total) rectally daily as needed for severe constipation. 20 suppository 0  . carvedilol (COREG) 6.25 MG tablet Take 1 tablet (6.25 mg total) by mouth 2 (two) times daily with a meal. 60 tablet 0  . Cholecalciferol (DIALYVITE VITAMIN D3 MAX) 13086 units TABS Take 1 tablet by mouth every 30 (thirty) days.    . clopidogrel (PLAVIX) 75 MG tablet Take 1 tablet (75 mg total) by mouth daily with breakfast. 30 tablet 2  . desvenlafaxine (PRISTIQ) 50 MG 24 hr tablet Take 50 mg by mouth daily.    Marland Kitchen gabapentin (NEURONTIN) 100 MG capsule Take 400 mg by mouth 3 (three) times daily.     Marland Kitchen ipratropium-albuterol (DUONEB) 0.5-2.5 (3) MG/3ML SOLN Take 3 mLs by nebulization 4 (four) times daily as needed (SOB).     . isosorbide mononitrate (IMDUR) 30 MG 24 hr tablet Take 1 tablet (30 mg total) by mouth daily. 30 tablet 1  . loratadine (CLARITIN) 10 MG tablet Take 10 mg by mouth daily.    . nitroGLYCERIN (NITROSTAT) 0.4 MG SL tablet  Place 1 tablet (0.4 mg total) under the tongue every 5 (five) minutes as needed for chest pain. 30 tablet 0  . senna-docusate (SENOKOT-S) 8.6-50 MG tablet Take 2 tablets by mouth at bedtime as needed for mild constipation.     . thiamine 100 MG tablet Take 1 tablet (100 mg total) by mouth daily.    . furosemide (LASIX) 20 MG tablet Take  1 tablet (20 mg total) by mouth daily as needed for fluid or edema (weight gain 3 lbs in 24 hours or 5 lbs in 7 days.). (Patient not taking: Reported on 11/25/2019) 20 tablet     Allergies as of 11/25/2019 - Review Complete 11/25/2019  Allergen Reaction Noted  . Chantix [varenicline] Other (See Comments) 08/30/2012     Review of Systems:    This is positive for those things mentioned in the HPI,  All other review of systems are negative/unable to obtain due to dementa      Physical Exam:  Vital signs in last 24 hours: Temp:  [97.5 F (36.4 C)-98.6 F (37 C)] 98.6 F (37 C) (01/18 0225) Pulse Rate:  [32-122] 94 (01/18 1015) Resp:  [0-23] 17 (01/18 1045) BP: (85-118)/(47-66) 105/63 (01/18 1030) SpO2:  [92 %-100 %] 100 % (01/18 1015)    General:  Frail and chronically ill Eyes:  anicteric. ENT:   edentulous Neck:   Sunken [place] fossae Abdomen:  thin, soft, non-tender, no hepatosplenomegaly or mass .  Lymph:  no cervical or supraclavicular adenopathy. Extremities:   no edema Neuro:  Awake, alert, answers ? Somewhat appropriately but did not explicitly answer the ?'s re: orientation, etc Psych:  ? If insight intact   Data Reviewed:   LAB RESULTS: Recent Labs    11/25/19 2044 11/26/19 0724  WBC 28.3* 21.3*  HGB 6.3* 8.6*  HCT 20.4* 27.1*  PLT 338 336   BMET Recent Labs    11/25/19 2044 11/26/19 0609  NA 138 141  K 4.4 3.9  CL 108 111  CO2 16* 17*  GLUCOSE 126* 103*  BUN 91* 76*  CREATININE 1.15 0.98  CALCIUM 9.0 8.4*   LFT Recent Labs    11/26/19 0609  PROT 5.0*  ALBUMIN 2.3*  AST 26  ALT 10  ALKPHOS 99  BILITOT 0.8   PT/INR Recent Labs    11/25/19 2133  LABPROT 23.6*  INR 2.1*    STUDIES: CT ANGIO CHEST PE W OR WO CONTRAST  Result Date: 11/26/2019 CLINICAL DATA:  Dyspnea, chronic, unclear etiology COVID positive. EXAM: CT ANGIOGRAPHY CHEST WITH CONTRAST TECHNIQUE: Multidetector CT imaging of the chest was performed using the standard  protocol during bolus administration of intravenous contrast. Multiplanar CT image reconstructions and MIPs were obtained to evaluate the vascular anatomy. CONTRAST:  127m OMNIPAQUE IOHEXOL 350 MG/ML SOLN COMPARISON:  Chest radiograph yesterday. Chest CT 10/18/2017 FINDINGS: Cardiovascular: There are no filling defects within the pulmonary arteries to suggest pulmonary embolus. Subsegmental branches are not well assessed. Aortic atherosclerosis. Cannot assess for dissection given phase of contrast bolus timing tailored to pulmonary artery evaluation. Coronary artery calcifications versus stents. Heart is normal in size. No pericardial effusion. Mediastinum/Nodes: Small mediastinal lymph nodes, all subcentimeter short axis. Largest node is prevascular measuring 9 mm. No hilar adenopathy. No esophageal wall thickening. No visualized thyroid nodule. 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 Minimal dependent atelectasis in the left lower lobe. Calcified granuloma in the right middle lobe. No noncalcified nodules. No pulmonary mass. No focal airspace disease. No ground-glass opacity typical of  COVID pneumonia. No pleural fluid. Upper Abdomen: Assessed on abdominal CT yesterday. Musculoskeletal: Expansile lytic lesion involving the right posterolateral seventh rib with bony destruction. Lytic lesion involving posterior elements of T7 extend into and may obliterate spinal canal. Lesion extends to involve the inferior aspect of T6 spinous process. L1 vertebral body lesion was assessed on abdominal CT yesterday. Review of the MIP images confirms the above findings. IMPRESSION: 1. No pulmonary embolus. 2. 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 No evidence of acute pneumonia or primary bronchogenic malignancy. 3. Expansile lytic lesions involving T7 vertebral body which likely extends to and likely obliterates the spinal canal. Destructive lytic lesion involving right posterolateral seventh rib. In conjunction with bone lesions  in the lumbar spine and pelvis, findings may represent metastatic disease or multiple myeloma. 4. Aortic atherosclerosis. Coronary artery calcifications versus stents. Aortic Atherosclerosis (ICD10-I70.0) and Emphysema (ICD10-J43.9). Electronically Signed   By: Keith Rake M.D.   On: 11/26/2019 03:59   CT Abdomen Pelvis W Contrast  Result Date: 11/26/2019 CLINICAL DATA:  GI bleed. EXAM: CT ABDOMEN AND PELVIS WITH CONTRAST TECHNIQUE: Multidetector CT imaging of the abdomen and pelvis was performed using the standard protocol following bolus administration of intravenous contrast. CONTRAST:  139m OMNIPAQUE IOHEXOL 300 MG/ML  SOLN COMPARISON:  Pelvis CT 09/28/2018. No prior abdominal imaging. FINDINGS: Lower chest: Breathing motion artifact. No pleural fluid. Heart is normal in size. There are coronary artery calcifications. Hepatobiliary: Small subcentimeter low-density in the central liver is too small to characterize but likely small cyst or hemangioma. Elongated gallbladder. No pericholecystic inflammation. No calcified gallstone. No biliary dilatation. Pancreas: Parenchymal atrophy. No ductal dilatation or inflammation. No evidence pancreatic mass. Spleen: Normal in size without focal abnormality. Trace perisplenic fluid. Adrenals/Urinary Tract: No adrenal nodule. Cortical scarring in the upper left kidney. No hydronephrosis. Low-density lesions in the left kidney are too small to characterize but likely cysts. No evidence of solid renal mass. Absent excretion on delayed phase imaging. Urinary bladder is distended. Mild bladder wall thickening at the base which appears similar to prior exam. The bladder appears slightly trabeculated. Stomach/Bowel: Motion and lack of enteric contrast limits gastric evaluation. There is equivocal gastric wall thickening about the proximal stomach. Small bowel is decompressed. No bowel obstruction or small bowel inflammation. Sigmoid colonic wall thickening with  pericolonic edema, moderate length segment. Colonic diverticulosis without evidence of focal diverticulitis. No obvious colonic mass. The appendix is not confidently visualized. Vascular/Lymphatic: Severe aortic atherosclerosis. Advanced branch and peripheral atherosclerosis. Dense calcification at the origin of the celiac artery. Proximal mesenteric vessels are patent. Portal vein is patent. No enlarged lymph nodes in the abdomen or pelvis. Reproductive: Prostate gland not enlarged. Other: Trace perisplenic free fluid. No free air. Musculoskeletal: Multiple lytic lesions involving the lumbar spine and bony pelvis. L1 lesion involves the vertebral body, right pedicle and lamina with extraosseous extension, there is mass effect and invasion of the spinal canal. Pathologic fracture with infiltrated lesion of L3 vertebral body with soft tissue extension peripherally into the bilateral psoas muscles. Posterior extension into the spinal canal. Expansile lucent lesion involving the right anterior iliac bone, this is at site of previous cortical irregularity. Permeative expansile lesion involving the left posterior acetabulum and ischium. Lytic lesion involving the left pubic body. IMPRESSION: 1. Sigmoid colonic wall thickening with pericolonic edema, suspicious for colitis. This may be infectious or inflammatory, and likely cause of patient's GI bleed. Neoplastic involvement is felt less likely given length of involvement but not entirely excluded. 2. Multiple lytic  lesions involving the lumbar spine and bony pelvis. Findings may represent metastatic disease or multiple myeloma. There is extraosseous extension at L1 and L3 with mass effect and invasion of the spinal canal. Expansile lytic lesions in the right iliac bone is at site of previous cortical irregularity. Permeative lytic lesion involving the posterior left acetabulum and left pubic body. Given history of emphysema, consider further evaluation with chest CT to  evaluate for pulmonary mass (preferably with IV contrast to assess for hilar regions). 3. Distended urinary bladder with mild bladder wall thickening at the base and suggestion of trabeculation, similar to prior exam. 4. Equivocal gastric wall thickening about the proximal stomach. 5. Advanced aortic and branch atherosclerosis. Severe peripheral vascular disease. Aortic Atherosclerosis (ICD10-I70.0). Electronically Signed   By: Keith Rake M.D.   On: 11/26/2019 00:57   DG Chest Port 1 View  Result Date: 11/25/2019 CLINICAL DATA:  Weakness EXAM: PORTABLE CHEST 1 VIEW COMPARISON:  Radiograph 02/10/2018, CT 10/18/2017 FINDINGS: Background of diffusely coarsened interstitial changes in the lungs which may be accentuated by low lung volumes. No focal consolidative opacity. No convincing features of edema. The aorta is calcified. The remaining cardiomediastinal contours are unremarkable. No acute osseous or soft tissue abnormality. Degenerative changes are present in the imaged spine and shoulders. The osseous structures appear diffusely demineralized which may limit detection of small or nondisplaced fractures. IMPRESSION: Background of chronic interstitial changes and emphysema likely accentuated by low lung volumes. No definite acute cardiopulmonary abnormality. Aortic Atherosclerosis (ICD10-I70.0). Electronically Signed   By: Lovena Le M.D.   On: 11/25/2019 21:31       Thanks   LOS: 1 day   @Tahtiana Rozier  Simonne Maffucci, MD, [facility] @  11/26/2019, 10:58 AM

## 2019-11-26 NOTE — ED Notes (Signed)
Echo at bedside

## 2019-11-26 NOTE — ED Notes (Signed)
Dr. Louanne Belton made aware that orbital x ray is needed prior to pt going to MRI

## 2019-11-26 NOTE — ED Notes (Signed)
Patient transported to MRI 

## 2019-11-26 NOTE — Consult Note (Addendum)
Vincent Dunlap  [phone] [phone]  I have done extensive chart review and agree with documentation as below Vincent Dunlap  Referral MD: Dr. Toy Baker  Reason for Referral: Multiple lytic lesions  HPI: Vincent Dunlap is a 79 year old male with a past medical history significant for COPD, CAD triple-vessel disease not a candidate for surgical intervention, peripheral vascular disease, history of systolic congestive heart failure with EF of 30%.  The patient is a resident at [facility] and was sent to the ER secondary to AMS and hypotension.  Upon arrival to the ER, he could not provide a detailed history.  At baseline, he is unable to ambulate.  In the ER, his chest x-ray showed COPD and he was noted to have an elevated lactic acid.  COVID-19 testing was positive.  CBC showed a white blood cell count 28.3, hemoglobin was 6.3, and platelets were normal.  BUN was elevated at 91. He was noted to have heme positive stools.  He had a CT of the abdomen pelvis with contrast which showed sigmoid colonic wall thickening suspicious for colitis, multiple lytic lesions involving the lumbar spine and bony pelvis concerning for metastatic disease versus multiple myeloma, extraosseous extension at L1 and L3 with mass-effect and invasion of the spinal canal.  He then had a CT angiogram of the chest which did not show evidence of PE but did show expansile lytic lesions involving T7 vertebral body which likely extends to and likely obliterates the spinal canal, destructive lytic lesion involving the right posterior lateral seventh rib.  SPEP has been obtained and results are pending.  GI has been consulted for GI bleed.  Due to the patient's COVID-19 diagnosis and attempt to reduce use of PPE, I have attempted to perform an E link visit with this patient.  E link is not available in this patient's room.  I have attempted to call this patient on the  telephone, but was unsuccessful in reaching him.  Review of systems and physical exam per primary team.    Past Medical History:  Diagnosis Date   CAD (coronary artery disease) 07/2016   3 vessel disease,  evaluated by Dr Servando Snare and felt to be a poor surgical candidate   CHF (congestive heart failure) ([place])    Hypertension    Ischemic cardiomyopathy 2017   EF 30%   Tobacco abuse    :  Past Surgical History:  Procedure Laterality Date   ABDOMINAL AORTOGRAM W/LOWER EXTREMITY Right 07/26/2017   Procedure: ABDOMINAL AORTOGRAM W/LOWER EXTREMITY;  Surgeon: Serafina Mitchell, MD;  Location: [place] [facility];  Service: Cardiovascular;  Laterality: Right;   AMPUTATION Right 07/28/2017   Procedure: AMPUTATION RIGHT Second TOE;  Surgeon: Waynetta Sandy, MD;  Location: [place];  Service: Vascular;  Laterality: Right;   CARDIAC CATHETERIZATION N/A 07/19/2016   Procedure: Right/Left Heart Cath and Coronary Angiography;  Surgeon: Troy Sine, MD;  Location: [place] [facility];  Service: Cardiovascular;  Laterality: N/A;   INGUINAL HERNIA REPAIR Bilateral    PERIPHERAL VASCULAR INTERVENTION Right 07/26/2017   Procedure: PERIPHERAL VASCULAR INTERVENTION;  Surgeon: Serafina Mitchell, MD;  Location: [place] [facility];  Service: Cardiovascular;  Laterality: Right;   :  Current Facility-Administered Medications  Medication Dose Route Frequency Provider Last Rate Last Admin   0.9 %  sodium chloride infusion   Intravenous Continuous Doutova, Anastassia, MD 50 mL/hr at 11/26/19 0518 Restarted at 11/26/19 [phone]  acetaminophen (TYLENOL) tablet 650 mg  650 mg Oral Q6H PRN Toy Baker, MD       Or   acetaminophen (TYLENOL) suppository 650 mg  650 mg Rectal Q6H PRN Doutova, Anastassia, MD       amitriptyline (ELAVIL) tablet 75 mg  75 mg Oral QHS Doutova, Nyoka Lint, MD   75 mg at 11/26/19 0142   atorvastatin (LIPITOR) tablet 80 mg  80 mg Oral q1800 Doutova, Anastassia, MD       ceFEPIme  (MAXIPIME) 2 g in sodium chloride 0.9 % 100 mL IVPB  2 g Intravenous Q8H Doutova, Anastassia, MD       HYDROcodone-acetaminophen (NORCO/VICODIN) 5-325 MG per tablet 1-2 tablet  1-2 tablet Oral Q4H PRN Doutova, Anastassia, MD       ipratropium-albuterol (DUONEB) 0.5-2.5 (3) MG/3ML nebulizer solution 3 mL  3 mL Nebulization QID PRN Doutova, Anastassia, MD       metroNIDAZOLE (FLAGYL) IVPB 500 mg  500 mg Intravenous Q8H Doutova, Nyoka Lint, MD   Stopped at 11/26/19 0612   ondansetron (ZOFRAN) tablet 4 mg  4 mg Oral Q6H PRN Toy Baker, MD       Or   ondansetron (ZOFRAN) injection 4 mg  4 mg Intravenous Q6H PRN Doutova, Anastassia, MD       pantoprazole (PROTONIX) 80 mg in sodium chloride 0.9 % 250 mL (0.32 mg/mL) infusion  8 mg/hr Intravenous Continuous Doutova, Anastassia, MD 25 mL/hr at 11/26/19 0444 8 mg/hr at 11/26/19 0444   [START ON 11/29/2019] pantoprazole (PROTONIX) injection 40 mg  40 mg Intravenous Q12H Toy Baker, MD       [START ON 11/27/2019] remdesivir 100 mg in sodium chloride 0.9 % 100 mL IVPB  100 mg Intravenous Daily Doutova, Anastassia, MD       sodium chloride flush (NS) 0.9 % injection 3 mL  3 mL Intravenous Once Doutova, Anastassia, MD       thiamine tablet 100 mg  100 mg Oral Daily Doutova, Anastassia, MD       vancomycin (VANCOREADY) IVPB 1500 mg/300 mL  1,500 mg Intravenous Q24H Doutova, Anastassia, MD       Current Outpatient Medications  Medication Sig Dispense Refill   acetaminophen (TYLENOL) 500 MG tablet Take 500 mg by mouth every 4 (four) hours as needed for mild pain or moderate pain.     amitriptyline (ELAVIL) 75 MG tablet Take 75 mg by mouth at bedtime.     aspirin 81 MG chewable tablet Chew 1 tablet (81 mg total) by mouth daily. 30 tablet 1   atorvastatin (LIPITOR) 80 MG tablet Take 1 tablet (80 mg total) by mouth daily at 6 PM. 30 tablet 0   bisacodyl (DULCOLAX) 10 MG suppository Place 1 suppository (10 mg total) rectally daily as needed for severe  constipation. 20 suppository 0   carvedilol (COREG) 6.25 MG tablet Take 1 tablet (6.25 mg total) by mouth 2 (two) times daily with a meal. 60 tablet 0   Cholecalciferol (DIALYVITE VITAMIN D3 MAX) 56314 units TABS Take 1 tablet by mouth every 30 (thirty) days.     clopidogrel (PLAVIX) 75 MG tablet Take 1 tablet (75 mg total) by mouth daily with breakfast. 30 tablet 2   desvenlafaxine (PRISTIQ) 50 MG 24 hr tablet Take 50 mg by mouth daily.     gabapentin (NEURONTIN) 100 MG capsule Take 400 mg by mouth 3 (three) times daily.      ipratropium-albuterol (DUONEB) 0.5-2.5 (3) MG/3ML SOLN Take 3 mLs by nebulization 4 (four)  times daily as needed (SOB).      isosorbide mononitrate (IMDUR) 30 MG 24 hr tablet Take 1 tablet (30 mg total) by mouth daily. 30 tablet 1   loratadine (CLARITIN) 10 MG tablet Take 10 mg by mouth daily.     nitroGLYCERIN (NITROSTAT) 0.4 MG SL tablet Place 1 tablet (0.4 mg total) under the tongue every 5 (five) minutes as needed for chest pain. 30 tablet 0   senna-docusate (SENOKOT-S) 8.6-50 MG tablet Take 2 tablets by mouth at bedtime as needed for mild constipation.      thiamine 100 MG tablet Take 1 tablet (100 mg total) by mouth daily.     furosemide (LASIX) 20 MG tablet Take 1 tablet (20 mg total) by mouth daily as needed for fluid or edema (weight gain 3 lbs in 24 hours or 5 lbs in 7 days.). (Patient not taking: Reported on 11/25/2019) 20 tablet      Allergies  Allergen Reactions   Chantix [Varenicline] Other (See Comments)    Pt states it made him "crazy"   :  Family History  Problem Relation Age of Onset   Alzheimer's disease Mother    Diabetes Mother        Diabetes in the maternal side of the family but not in mother   Heart disease Neg Hx    Cancer Neg Hx    :  Social History   Socioeconomic History   Marital status: Single    Spouse name: Not on file   Number of children: Not on file   Years of education: Not on file   Highest education level: Not on  file  Occupational History   Not on file  Tobacco Use   Smoking status: Former Smoker    Packs/day: 1.00    Types: Cigarettes    Quit date: 09/08/2017    Years since quitting: 2.2   Smokeless tobacco: Former Network engineer and Sexual Activity   Alcohol use: No    Comment: Former heavy binge drinker until 2014   Drug use: No   Sexual activity: Not on file  Other Topics Concern   Not on file  Social History Narrative   Not on file   Social Determinants of Health   Financial Resource Strain:    Difficulty of Paying Living Expenses: Not on file  Food Insecurity:    Worried About Charity fundraiser in the Last Year: Not on file   [employer] of Food in the Last Year: Not on file  Transportation Needs:    Lack of Transportation (Medical): Not on file   Lack of Transportation (Non-Medical): Not on file  Physical Activity:    Days of Exercise per Week: Not on file   Minutes of Exercise per Session: Not on file  Stress:    Feeling of Stress : Not on file  Social Connections:    Frequency of Communication with Friends and Family: Not on file   Frequency of Social Gatherings with Friends and Family: Not on file   Attends Religious Services: Not on file   Active Member of Clubs or Organizations: Not on file   Attends Archivist Meetings: Not on file   Marital Status: Not on file  Intimate Partner Violence:    Fear of Current or Ex-Partner: Not on file   Emotionally Abused: Not on file   Physically Abused: Not on file   Sexually Abused: Not on file  :  Review of Systems: Per primary  team  Exam: Patient Vitals for the past 24 hrs:  BP Temp Temp src Pulse Resp SpO2  11/26/19 0715 (!) 116/56 -- -- -- 20 --  11/26/19 0700 (!) 112/58 -- -- -- 15 --  11/26/19 0645 (!) 103/57 -- -- -- 15 --  11/26/19 0630 (!) 96/51 -- -- -- 19 --  11/26/19 0615 (!) 118/55 -- -- -- 15 --  11/26/19 0600 (!) 106/55 -- -- 84 (!) 23 96 %  11/26/19 0545 (!) 100/53 -- -- -- 16 --  11/26/19  0530 (!) 95/59 -- -- -- (!) 23 --  11/26/19 0515 (!) 97/56 -- -- -- 15 --  11/26/19 0500 (!) 91/53 -- -- -- 15 --  11/26/19 0445 (!) 99/51 -- -- -- 15 --  11/26/19 0430 (!) 94/52 -- -- (!) 32 19 92 %  11/26/19 0415 113/60 -- -- -- 15 --  11/26/19 0400 (!) 114/58 -- -- -- (!) 22 --  11/26/19 0345 -- -- -- -- 13 --  11/26/19 0315 (!) 112/58 -- -- -- 13 --  11/26/19 0300 (!) 111/58 -- -- (!) 122 17 94 %  11/26/19 0245 (!) 110/57 -- -- -- 15 --  11/26/19 0230 (!) 100/47 -- -- (!) 101 11 99 %  11/26/19 0225 (!) 107/51 98.6 F (37 C) Oral (!) 107 16 100 %  11/26/19 0215 (!) 107/51 -- -- (!) 103 18 100 %  11/26/19 0206 (!) 108/48 98.1 F (36.7 C) Oral 95 16 100 %  11/26/19 0200 (!) 108/48 -- -- (!) 103 17 100 %  11/26/19 0145 98/60 -- -- (!) 106 13 100 %  11/26/19 0130 (!) 111/57 -- -- (!) 116 17 100 %  11/26/19 0115 114/66 -- -- (!) 113 17 100 %  11/26/19 0100 114/66 -- -- -- 17 --  11/26/19 0045 115/64 -- -- -- 16 --  11/26/19 0030 108/65 -- -- -- 15 --  11/25/19 2345 (!) 105/54 -- -- (!) 108 17 100 %  11/25/19 2330 (!) 110/57 -- -- (!) 106 15 100 %  11/25/19 2322 (!) 100/56 98.1 F (36.7 C) Oral (!) 104 18 100 %  11/25/19 2315 (!) 85/57 -- -- (!) 111 (!) 21 100 %  11/25/19 2300 -- -- -- (!) 111 15 100 %  11/25/19 2245 94/60 98.4 F (36.9 C) Oral (!) 110 (!) 0 100 %  11/25/19 2230 92/64 -- -- (!) 111 15 100 %  11/25/19 2200 (!) 114/59 -- -- (!) 107 16 100 %  11/25/19 2145 108/63 -- -- (!) 101 20 100 %  11/25/19 2115 -- -- -- 100 19 100 %  11/25/19 2045 (!) 93/55 -- -- -- 15 --  11/25/19 2039 -- (!) 97.5 F (36.4 C) Temporal -- -- --  11/25/19 2038 (!) 94/57 -- -- (!) 103 17 97 %    Exam per primary team.  Lab Results  Component Value Date   WBC 28.3 (H) 11/25/2019   HGB 6.3 (LL) 11/25/2019   HCT 20.4 (L) 11/25/2019   PLT 338 11/25/2019   GLUCOSE 103 (H) 11/26/2019   CHOL 134 04/19/2018   TRIG 103 04/19/2018   HDL 39 04/19/2018   LDLCALC 75 04/19/2018   ALT 10  11/26/2019   AST 26 11/26/2019   NA 141 11/26/2019   K 3.9 11/26/2019   CL 111 11/26/2019   CREATININE 0.98 11/26/2019   BUN 76 (H) 11/26/2019   CO2 17 (L) 11/26/2019   I have personally reviewed  the CT imaging and agree with the documentation  CT ANGIO CHEST PE W OR WO CONTRAST  Result Date: 11/26/2019 CLINICAL DATA:  Dyspnea, chronic, unclear etiology COVID positive. EXAM: CT ANGIOGRAPHY CHEST WITH CONTRAST TECHNIQUE: Multidetector CT imaging of the chest was performed using the standard protocol during bolus administration of intravenous contrast. Multiplanar CT image reconstructions and MIPs were obtained to evaluate the vascular anatomy. CONTRAST:  13m OMNIPAQUE IOHEXOL 350 MG/ML SOLN COMPARISON:  Chest radiograph yesterday. Chest CT 10/18/2017 FINDINGS: Cardiovascular: There are no filling defects within the pulmonary arteries to suggest pulmonary embolus. Subsegmental branches are not well assessed. Aortic atherosclerosis. Cannot assess for dissection given phase of contrast bolus timing tailored to pulmonary artery evaluation. Coronary artery calcifications versus stents. Heart is normal in size. No pericardial effusion. Mediastinum/Nodes: Small mediastinal lymph nodes, all subcentimeter short axis. Largest node is prevascular measuring 9 mm. No hilar adenopathy. No esophageal wall thickening. No visualized thyroid nodule. 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 Minimal dependent atelectasis in the left lower lobe. Calcified granuloma in the right middle lobe. No noncalcified nodules. No pulmonary mass. No focal airspace disease. No ground-glass opacity typical of COVID pneumonia. No pleural fluid. Upper Abdomen: Assessed on abdominal CT yesterday. Musculoskeletal: Expansile lytic lesion involving the right posterolateral seventh rib with bony destruction. Lytic lesion involving posterior elements of T7 extend into and may obliterate spinal canal. Lesion extends to involve the inferior  aspect of T6 spinous process. L1 vertebral body lesion was assessed on abdominal CT yesterday. Review of the MIP images confirms the above findings. IMPRESSION: 1. No pulmonary embolus. 2. 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 No evidence of acute pneumonia or primary bronchogenic malignancy. 3. Expansile lytic lesions involving T7 vertebral body which likely extends to and likely obliterates the spinal canal. Destructive lytic lesion involving right posterolateral seventh rib. In conjunction with bone lesions in the lumbar spine and pelvis, findings may represent metastatic disease or multiple myeloma. 4. Aortic atherosclerosis. Coronary artery calcifications versus stents. Aortic Atherosclerosis (ICD10-I70.0) and Emphysema (ICD10-J43.9). Electronically Signed   By: MKeith RakeM.D.   On: 11/26/2019 03:59   CT Abdomen Pelvis W Contrast  Result Date: 11/26/2019 CLINICAL DATA:  GI bleed. EXAM: CT ABDOMEN AND PELVIS WITH CONTRAST TECHNIQUE: Multidetector CT imaging of the abdomen and pelvis was performed using the standard protocol following bolus administration of intravenous contrast. CONTRAST:  1047mOMNIPAQUE IOHEXOL 300 MG/ML  SOLN COMPARISON:  Pelvis CT 09/28/2018. No prior abdominal imaging. FINDINGS: Lower chest: Breathing motion artifact. No pleural fluid. Heart is normal in size. There are coronary artery calcifications. Hepatobiliary: Small subcentimeter low-density in the central liver is too small to characterize but likely small cyst or hemangioma. Elongated gallbladder. No pericholecystic inflammation. No calcified gallstone. No biliary dilatation. Pancreas: Parenchymal atrophy. No ductal dilatation or inflammation. No evidence pancreatic mass. Spleen: Normal in size without focal abnormality. Trace perisplenic fluid. Adrenals/Urinary Tract: No adrenal nodule. Cortical scarring in the upper left kidney. No hydronephrosis. Low-density lesions in the left kidney are too small to characterize but likely  cysts. No evidence of solid renal mass. Absent excretion on delayed phase imaging. Urinary bladder is distended. Mild bladder wall thickening at the base which appears similar to prior exam. The bladder appears slightly trabeculated. Stomach/Bowel: Motion and lack of enteric contrast limits gastric evaluation. There is equivocal gastric wall thickening about the proximal stomach. Small bowel is decompressed. No bowel obstruction or small bowel inflammation. Sigmoid colonic wall thickening with pericolonic edema, moderate length segment. Colonic diverticulosis without evidence  of focal diverticulitis. No obvious colonic mass. The appendix is not confidently visualized. Vascular/Lymphatic: Severe aortic atherosclerosis. Advanced branch and peripheral atherosclerosis. Dense calcification at the origin of the celiac artery. Proximal mesenteric vessels are patent. Portal vein is patent. No enlarged lymph nodes in the abdomen or pelvis. Reproductive: Prostate gland not enlarged. Other: Trace perisplenic free fluid. No free air. Musculoskeletal: Multiple lytic lesions involving the lumbar spine and bony pelvis. L1 lesion involves the vertebral body, right pedicle and lamina with extraosseous extension, there is mass effect and invasion of the spinal canal. Pathologic fracture with infiltrated lesion of L3 vertebral body with soft tissue extension peripherally into the bilateral psoas muscles. Posterior extension into the spinal canal. Expansile lucent lesion involving the right anterior iliac bone, this is at site of previous cortical irregularity. Permeative expansile lesion involving the left posterior acetabulum and ischium. Lytic lesion involving the left pubic body. IMPRESSION: 1. Sigmoid colonic wall thickening with pericolonic edema, suspicious for colitis. This may be infectious or inflammatory, and likely cause of patient's GI bleed. Neoplastic involvement is felt less likely given length of involvement but not  entirely excluded. 2. Multiple lytic lesions involving the lumbar spine and bony pelvis. Findings may represent metastatic disease or multiple myeloma. There is extraosseous extension at L1 and L3 with mass effect and invasion of the spinal canal. Expansile lytic lesions in the right iliac bone is at site of previous cortical irregularity. Permeative lytic lesion involving the posterior left acetabulum and left pubic body. Given history of emphysema, consider further evaluation with chest CT to evaluate for pulmonary mass (preferably with IV contrast to assess for hilar regions). 3. Distended urinary bladder with mild bladder wall thickening at the base and suggestion of trabeculation, similar to prior exam. 4. Equivocal gastric wall thickening about the proximal stomach. 5. Advanced aortic and branch atherosclerosis. Severe peripheral vascular disease. Aortic Atherosclerosis (ICD10-I70.0). Electronically Signed   By: Keith Rake M.D.   On: 11/26/2019 00:57   DG Chest Port 1 View  Result Date: 11/25/2019 CLINICAL DATA:  Weakness EXAM: PORTABLE CHEST 1 VIEW COMPARISON:  Radiograph 02/10/2018, CT 10/18/2017 FINDINGS: Background of diffusely coarsened interstitial changes in the lungs which may be accentuated by low lung volumes. No focal consolidative opacity. No convincing features of edema. The aorta is calcified. The remaining cardiomediastinal contours are unremarkable. No acute osseous or soft tissue abnormality. Degenerative changes are present in the imaged spine and shoulders. The osseous structures appear diffusely demineralized which may limit detection of small or nondisplaced fractures. IMPRESSION: Background of chronic interstitial changes and emphysema likely accentuated by low lung volumes. No definite acute cardiopulmonary abnormality. Aortic Atherosclerosis (ICD10-I70.0). Electronically Signed   By: Lovena Le M.D.   On: 11/25/2019 21:31     CT ANGIO CHEST PE W OR WO CONTRAST  Result  Date: 11/26/2019 CLINICAL DATA:  Dyspnea, chronic, unclear etiology COVID positive. EXAM: CT ANGIOGRAPHY CHEST WITH CONTRAST TECHNIQUE: Multidetector CT imaging of the chest was performed using the standard protocol during bolus administration of intravenous contrast. Multiplanar CT image reconstructions and MIPs were obtained to evaluate the vascular anatomy. CONTRAST:  172m OMNIPAQUE IOHEXOL 350 MG/ML SOLN COMPARISON:  Chest radiograph yesterday. Chest CT 10/18/2017 FINDINGS: Cardiovascular: There are no filling defects within the pulmonary arteries to suggest pulmonary embolus. Subsegmental branches are not well assessed. Aortic atherosclerosis. Cannot assess for dissection given phase of contrast bolus timing tailored to pulmonary artery evaluation. Coronary artery calcifications versus stents. Heart is normal in size. No pericardial effusion.  Mediastinum/Nodes: Small mediastinal lymph nodes, all subcentimeter short axis. Largest node is prevascular measuring 9 mm. No hilar adenopathy. No esophageal wall thickening. No visualized thyroid nodule. 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 Minimal dependent atelectasis in the left lower lobe. Calcified granuloma in the right middle lobe. No noncalcified nodules. No pulmonary mass. No focal airspace disease. No ground-glass opacity typical of COVID pneumonia. No pleural fluid. Upper Abdomen: Assessed on abdominal CT yesterday. Musculoskeletal: Expansile lytic lesion involving the right posterolateral seventh rib with bony destruction. Lytic lesion involving posterior elements of T7 extend into and may obliterate spinal canal. Lesion extends to involve the inferior aspect of T6 spinous process. L1 vertebral body lesion was assessed on abdominal CT yesterday. Review of the MIP images confirms the above findings. IMPRESSION: 1. No pulmonary embolus. 2. 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 No evidence of acute pneumonia or primary bronchogenic malignancy. 3. Expansile  lytic lesions involving T7 vertebral body which likely extends to and likely obliterates the spinal canal. Destructive lytic lesion involving right posterolateral seventh rib. In conjunction with bone lesions in the lumbar spine and pelvis, findings may represent metastatic disease or multiple myeloma. 4. Aortic atherosclerosis. Coronary artery calcifications versus stents. Aortic Atherosclerosis (ICD10-I70.0) and Emphysema (ICD10-J43.9). Electronically Signed   By: Keith Rake M.D.   On: 11/26/2019 03:59   CT Abdomen Pelvis W Contrast  Result Date: 11/26/2019 CLINICAL DATA:  GI bleed. EXAM: CT ABDOMEN AND PELVIS WITH CONTRAST TECHNIQUE: Multidetector CT imaging of the abdomen and pelvis was performed using the standard protocol following bolus administration of intravenous contrast. CONTRAST:  167m OMNIPAQUE IOHEXOL 300 MG/ML  SOLN COMPARISON:  Pelvis CT 09/28/2018. No prior abdominal imaging. FINDINGS: Lower chest: Breathing motion artifact. No pleural fluid. Heart is normal in size. There are coronary artery calcifications. Hepatobiliary: Small subcentimeter low-density in the central liver is too small to characterize but likely small cyst or hemangioma. Elongated gallbladder. No pericholecystic inflammation. No calcified gallstone. No biliary dilatation. Pancreas: Parenchymal atrophy. No ductal dilatation or inflammation. No evidence pancreatic mass. Spleen: Normal in size without focal abnormality. Trace perisplenic fluid. Adrenals/Urinary Tract: No adrenal nodule. Cortical scarring in the upper left kidney. No hydronephrosis. Low-density lesions in the left kidney are too small to characterize but likely cysts. No evidence of solid renal mass. Absent excretion on delayed phase imaging. Urinary bladder is distended. Mild bladder wall thickening at the base which appears similar to prior exam. The bladder appears slightly trabeculated. Stomach/Bowel: Motion and lack of enteric contrast limits gastric  evaluation. There is equivocal gastric wall thickening about the proximal stomach. Small bowel is decompressed. No bowel obstruction or small bowel inflammation. Sigmoid colonic wall thickening with pericolonic edema, moderate length segment. Colonic diverticulosis without evidence of focal diverticulitis. No obvious colonic mass. The appendix is not confidently visualized. Vascular/Lymphatic: Severe aortic atherosclerosis. Advanced branch and peripheral atherosclerosis. Dense calcification at the origin of the celiac artery. Proximal mesenteric vessels are patent. Portal vein is patent. No enlarged lymph nodes in the abdomen or pelvis. Reproductive: Prostate gland not enlarged. Other: Trace perisplenic free fluid. No free air. Musculoskeletal: Multiple lytic lesions involving the lumbar spine and bony pelvis. L1 lesion involves the vertebral body, right pedicle and lamina with extraosseous extension, there is mass effect and invasion of the spinal canal. Pathologic fracture with infiltrated lesion of L3 vertebral body with soft tissue extension peripherally into the bilateral psoas muscles. Posterior extension into the spinal canal. Expansile lucent lesion involving the right anterior iliac bone, this is at site of  previous cortical irregularity. Permeative expansile lesion involving the left posterior acetabulum and ischium. Lytic lesion involving the left pubic body. IMPRESSION: 1. Sigmoid colonic wall thickening with pericolonic edema, suspicious for colitis. This may be infectious or inflammatory, and likely cause of patient's GI bleed. Neoplastic involvement is felt less likely given length of involvement but not entirely excluded. 2. Multiple lytic lesions involving the lumbar spine and bony pelvis. Findings may represent metastatic disease or multiple myeloma. There is extraosseous extension at L1 and L3 with mass effect and invasion of the spinal canal. Expansile lytic lesions in the right iliac bone is at  site of previous cortical irregularity. Permeative lytic lesion involving the posterior left acetabulum and left pubic body. Given history of emphysema, consider further evaluation with chest CT to evaluate for pulmonary mass (preferably with IV contrast to assess for hilar regions). 3. Distended urinary bladder with mild bladder wall thickening at the base and suggestion of trabeculation, similar to prior exam. 4. Equivocal gastric wall thickening about the proximal stomach. 5. Advanced aortic and branch atherosclerosis. Severe peripheral vascular disease. Aortic Atherosclerosis (ICD10-I70.0). Electronically Signed   By: Keith Rake M.D.   On: 11/26/2019 00:57   DG Chest Port 1 View  Result Date: 11/25/2019 CLINICAL DATA:  Weakness EXAM: PORTABLE CHEST 1 VIEW COMPARISON:  Radiograph 02/10/2018, CT 10/18/2017 FINDINGS: Background of diffusely coarsened interstitial changes in the lungs which may be accentuated by low lung volumes. No focal consolidative opacity. No convincing features of edema. The aorta is calcified. The remaining cardiomediastinal contours are unremarkable. No acute osseous or soft tissue abnormality. Degenerative changes are present in the imaged spine and shoulders. The osseous structures appear diffusely demineralized which may limit detection of small or nondisplaced fractures. IMPRESSION: Background of chronic interstitial changes and emphysema likely accentuated by low lung volumes. No definite acute cardiopulmonary abnormality. Aortic Atherosclerosis (ICD10-I70.0). Electronically Signed   By: Lovena Le M.D.   On: 11/25/2019 21:31   Assessment and Plan:   Multiple lytic bone lesions The patient was noted to have multiple lytic lesions involving the lumbar spine There is concern for cord compression Differentials include multiple myeloma versus metastatic disease CT scans do not show an obvious primary SPEP pending Radiation oncology consult has been requested and is  pending Recommend neurosurgery consult to determine if the patient is a candidate for decompression We will defer to neurosurgery and radiation oncology regarding dexamethasone Unable to comment on treatment until biopsy or further results are available  Anemia secondary to GI bleed Admission hemoglobin was 6.3 with heme positive stools GI consult is pending Recommend PRBC transfusion for hemoglobin less than 7 or active bleeding  COVID-19 infection Management per primary team  COPD Remains on nebulizers per primary team  CAD with triple-vessel disease He is not a candidate for surgical intervention per cardiology  Thank you for this referral.  I will continue to follow, will leave another note once more results are available   Mikey Bussing, DNP, AGPCNP-BC, AOCNP Heath Lark, MD

## 2019-11-26 NOTE — ED Notes (Signed)
Called to see if room on 2W ready, currently being zapped

## 2019-11-26 NOTE — ED Notes (Addendum)
Pt refusing MRI spine; Dr. Louanne Belton and Shona Simpson aware

## 2019-11-26 NOTE — Plan of Care (Signed)

## 2019-11-27 DIAGNOSIS — G9520 Unspecified cord compression: Secondary | ICD-10-CM

## 2019-11-27 DIAGNOSIS — Z515 Encounter for palliative care: Secondary | ICD-10-CM

## 2019-11-27 DIAGNOSIS — D62 Acute posthemorrhagic anemia: Secondary | ICD-10-CM

## 2019-11-27 DIAGNOSIS — C7951 Secondary malignant neoplasm of bone: Secondary | ICD-10-CM

## 2019-11-27 DIAGNOSIS — D509 Iron deficiency anemia, unspecified: Secondary | ICD-10-CM

## 2019-11-27 LAB — COMPREHENSIVE METABOLIC PANEL
ALT: 12 U/L (ref 0–44)
AST: 33 U/L (ref 15–41)
Albumin: 2.1 g/dL — ABNORMAL LOW (ref 3.5–5.0)
Alkaline Phosphatase: 86 U/L (ref 38–126)
Anion gap: 11 (ref 5–15)
BUN: 32 mg/dL — ABNORMAL HIGH (ref 8–23)
CO2: 16 mmol/L — ABNORMAL LOW (ref 22–32)
Calcium: 8.1 mg/dL — ABNORMAL LOW (ref 8.9–10.3)
Chloride: 115 mmol/L — ABNORMAL HIGH (ref 98–111)
Creatinine, Ser: 0.69 mg/dL (ref 0.61–1.24)
GFR calc Af Amer: >60 mL/min (ref >60)
GFR calc non Af Amer: >60 mL/min (ref >60)
Glucose, Bld: 97 mg/dL (ref 70–99)
Potassium: 3.6 mmol/L (ref 3.5–5.1)
Sodium: 142 mmol/L (ref 135–145)
Total Bilirubin: 0.8 mg/dL (ref 0.3–1.2)
Total Protein: 4.7 g/dL — ABNORMAL LOW (ref 6.5–8.1)

## 2019-11-27 LAB — TYPE AND SCREEN
ABO/RH(D): O POS
Antibody Screen: NEGATIVE
Unit division: 0
Unit division: 0

## 2019-11-27 LAB — CBC WITH DIFFERENTIAL/PLATELET
Abs Immature Granulocytes: 0.46 10*3/uL — ABNORMAL HIGH (ref 0.00–0.07)
Basophils Absolute: 0 10*3/uL (ref 0.0–0.1)
Basophils Relative: 0 %
Eosinophils Absolute: 0 10*3/uL (ref 0.0–0.5)
Eosinophils Relative: 0 %
HCT: 21.8 % — ABNORMAL LOW (ref 39.0–52.0)
Hemoglobin: 7.1 g/dL — ABNORMAL LOW (ref 13.0–17.0)
Immature Granulocytes: 2 %
Lymphocytes Relative: 11 %
Lymphs Abs: 2.1 10*3/uL (ref 0.7–4.0)
MCH: 29.1 pg (ref 26.0–34.0)
MCHC: 32.6 g/dL (ref 30.0–36.0)
MCV: 89.3 fL (ref 80.0–100.0)
Monocytes Absolute: 0.9 10*3/uL (ref 0.1–1.0)
Monocytes Relative: 5 %
Neutro Abs: 15.3 10*3/uL — ABNORMAL HIGH (ref 1.7–7.7)
Neutrophils Relative %: 82 %
Platelets: 312 10*3/uL (ref 150–400)
RBC: 2.44 MIL/uL — ABNORMAL LOW (ref 4.22–5.81)
RDW: 21.2 % — ABNORMAL HIGH (ref 11.5–15.5)
WBC: 18.8 10*3/uL — ABNORMAL HIGH (ref 4.0–10.5)
nRBC: 0 % (ref 0.0–0.2)

## 2019-11-27 LAB — PROTEIN ELECTROPHORESIS, SERUM
A/G Ratio: 1.2 (ref 0.7–1.7)
Albumin ELP: 2.8 g/dL — ABNORMAL LOW (ref 2.9–4.4)
Alpha-1-Globulin: 0.3 g/dL (ref 0.0–0.4)
Alpha-2-Globulin: 0.9 g/dL (ref 0.4–1.0)
Beta Globulin: 0.7 g/dL (ref 0.7–1.3)
Gamma Globulin: 0.6 g/dL (ref 0.4–1.8)
Globulin, Total: 2.4 g/dL (ref 2.2–3.9)
Total Protein ELP: 5.2 g/dL — ABNORMAL LOW (ref 6.0–8.5)

## 2019-11-27 LAB — PHOSPHORUS: Phosphorus: 2.7 mg/dL (ref 2.5–4.6)

## 2019-11-27 LAB — URINE CULTURE: Culture: NO GROWTH

## 2019-11-27 LAB — BPAM RBC
Blood Product Expiration Date: 202102152359
Blood Product Expiration Date: 202102152359
ISSUE DATE / TIME: 202101172259
ISSUE DATE / TIME: 202101180200
Unit Type and Rh: 5100
Unit Type and Rh: 5100

## 2019-11-27 LAB — FERRITIN: Ferritin: 164 ng/mL (ref 24–336)

## 2019-11-27 LAB — PROTIME-INR
INR: 1.6 — ABNORMAL HIGH (ref 0.8–1.2)
Prothrombin Time: 19.3 seconds — ABNORMAL HIGH (ref 11.4–15.2)

## 2019-11-27 LAB — LACTIC ACID, PLASMA
Lactic Acid, Venous: 2.6 mmol/L (ref 0.5–1.9)
Lactic Acid, Venous: 4 mmol/L (ref 0.5–1.9)

## 2019-11-27 LAB — D-DIMER, QUANTITATIVE: D-Dimer, Quant: 0.87 ug/mL-FEU — ABNORMAL HIGH (ref 0.00–0.50)

## 2019-11-27 LAB — MAGNESIUM: Magnesium: 1.5 mg/dL — ABNORMAL LOW (ref 1.7–2.4)

## 2019-11-27 LAB — C-REACTIVE PROTEIN: CRP: 4.8 mg/dL — ABNORMAL HIGH (ref <1.0)

## 2019-11-27 MED ORDER — VITAMIN K1 10 MG/ML IJ SOLN
5.0000 mg | Freq: Every day | INTRAMUSCULAR | Status: DC
  Administered 2019-11-27 – 2019-11-28 (×2): 5 mg via SUBCUTANEOUS
  Filled 2019-11-27 (×3): qty 0.5

## 2019-11-27 NOTE — Progress Notes (Signed)
I spoke with the patient's nurse Andee Poles who was extremely helpful. She received the patient last night prior to shift change, and reported that he was aware of his surroundings, but did note that his understanding of everything was limited. Today she was able to help me reach the patient by phone. He acknowledges that Vincent Dunlap is his closest friend who he would like to be a part of his care. He does not have family who help or other people who make decisions about his care with him. He does not know why he's in the hospital today. He was able to tell nursing earlier today that it was 2020. He is surprised when I explained to him how sick he is and that on top of the concern of an advanced or a metastatic cancer, he also has covid, an acute GI bleed, sepsis, along with an elevated troponin. Yesterday he refused to complete an MRI of the spine to formally evaluate for cord compression. On the phone the nurse confirms that he can wiggle his toes, but he cannot lift his legs independently. I'm very concerned that his insight, mental status, and overall condition is quite poor. I've reached out to Wadie Lessen, NP who will see him from palliative care. 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 He is not willing to have another MRI at this time. I will follow up with Stanton Kidney once she's had a chance to weigh in.     Carola Rhine, PAC

## 2019-11-27 NOTE — Progress Notes (Signed)
   Vital Signs MEWS/VS Documentation      11/27/2019 0524 11/27/2019 0700 11/27/2019 0748 11/27/2019 0953   MEWS Score:  1  2  2  2    MEWS Score Color:  Green  Yellow  Yellow  Yellow   Resp:  --  --  14  20   Pulse:  --  --  (!) 115  (!) 122   BP:  --  --  (!) 103/54  --   Temp:  --  --  98.3 F (36.8 C)  --   O2 Device:  --  --  Room Air  --   Level of Consciousness:  Alert  --  Alert  --       Notified Pokhrel MD of elevated HR. No changes made to plan of care. Pt does not appear to be in distress. Will continue to monitor.    Vincent Dunlap 11/27/2019,10:07 AM

## 2019-11-27 NOTE — Progress Notes (Addendum)
Daily Rounding Note  11/27/2019, 12:26 PM  LOS: 2 days   SUBJECTIVE:   Chief complaint:  GIB.  anemia   No stools late yesterday or today.  Tolerating D 1  Diet.    OBJECTIVE:         Vital signs in last 24 hours:    Temp:  [98.3 F (36.8 C)] 98.3 F (36.8 C) (01/19 1100) Pulse Rate:  [36-122] 117 (01/19 1141) Resp:  [12-20] 17 (01/19 1141) BP: (87-124)/(54-69) 93/55 (01/19 1141) SpO2:  [83 %-100 %] 100 % (01/19 1141) Weight:  [71.8 kg] 71.8 kg (01/18 2102) Last BM Date: 11/26/19 Filed Weights   11/26/19 2102  Weight: 71.8 kg   Not examined.  Spoke w pt's RN.    Intake/Output from previous day: 01/18 0701 - 01/19 0700 In: 1321.6 [I.V.:880; IV Piggyback:441.6] Out: 2200 [Urine:2200]  Intake/Output this shift: No intake/output data recorded.  Lab Results: Recent Labs    11/26/19 1600 11/26/19 2150 11/27/19 0823  WBC 18.3* 24.2* 18.8*  HGB 8.3* 8.8* 7.1*  HCT 25.7* 26.8* 21.8*  PLT 317 256 312   BMET Recent Labs    11/25/19 2044 11/26/19 0609 11/27/19 0823  NA 138 141 142  K 4.4 3.9 3.6  CL 108 111 115*  CO2 16* 17* 16*  GLUCOSE 126* 103* 97  BUN 91* 76* 32*  CREATININE 1.15 0.98 0.69  CALCIUM 9.0 8.4* 8.1*   LFT Recent Labs    11/25/19 2044 11/26/19 0609 11/27/19 0823  PROT 5.4* 5.0* 4.7*  ALBUMIN 2.4* 2.3* 2.1*  AST 18 26 33  ALT 10 10 12   ALKPHOS 102 99 86  BILITOT 0.7 0.8 0.8   PT/INR Recent Labs    11/26/19 1600 11/27/19 0823  LABPROT 20.4* 19.3*  INR 1.8* 1.6*    Studies/Results: CT ANGIO CHEST PE W OR WO CONTRAST  Result Date: 11/26/2019  IMPRESSION: 1. No pulmonary embolus. 2. 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 No evidence of acute pneumonia or primary bronchogenic malignancy. 3. Expansile lytic lesions involving T7 vertebral body which likely extends to and likely obliterates the spinal canal. Destructive lytic lesion involving right posterolateral seventh rib. In  conjunction with bone lesions in the lumbar spine and pelvis, findings may represent metastatic disease or multiple myeloma. 4. Aortic atherosclerosis. Coronary artery calcifications versus stents. Aortic Atherosclerosis (ICD10-I70.0) and Emphysema (ICD10-J43.9). Electronically Signed   By: Keith Rake M.D.   On: 11/26/2019 03:59   MR BRAIN WO CONTRAST  Result Date: 11/26/2019 . IMPRESSION: Please note that the examination was terminated prior to postcontrast imaging at the patient's request. This limits evaluation for intracranial metastatic disease. Punctate focus of restricted diffusion within the right precentral gyrus, which may reflect an acute infarct or tiny metastasis. Contrast-enhanced brain MRI is recommended for further evaluation. 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 Mild chronic small vessel ischemic disease within the pons. Moderate generalized parenchymal atrophy. Electronically Signed   By: Kellie Simmering DO   On: 11/26/2019 13:34   CT Abdomen Pelvis W Contrast  Result Date: 11/26/2019 . IMPRESSION: 1. Sigmoid colonic wall thickening with pericolonic edema, suspicious for colitis. This may be infectious or inflammatory, and likely cause of patient's GI bleed. Neoplastic involvement is felt less likely given length of involvement but not entirely excluded. 2. Multiple lytic lesions involving the lumbar spine and bony pelvis. Findings may represent metastatic disease or multiple myeloma. There is extraosseous extension at L1 and L3 with mass  effect and invasion of the spinal canal. Expansile lytic lesions in the right iliac bone is at site of previous cortical irregularity. Permeative lytic lesion involving the posterior left acetabulum and left pubic body. Given history of emphysema, consider further evaluation with chest CT to evaluate for pulmonary mass (preferably with IV contrast to assess for hilar regions). 3. Distended urinary bladder with mild bladder wall  thickening at the base and suggestion of trabeculation, similar to prior exam. 4. Equivocal gastric wall thickening about the proximal stomach. 5. Advanced aortic and branch atherosclerosis. Severe peripheral vascular disease. Aortic Atherosclerosis (ICD10-I70.0). Electronically Signed   By: Keith Rake M.D.   On: 11/26/2019 00:57     MR TOTAL SPINE METS SCREENING  Result Date: 11/26/2019  IMPRESSION: Please note the examination was prematurely terminated at the patient's request. Scout localizer imaging was obtained. A sagittal T1 weighted sequence extending from the level of the skull base to a level through the upper T8 vertebra was also obtained. Partially imaged mass eroding the T6 and T7 posterior elements. The caudal extent of the mass is not included in the field of view. 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 There is mass effect upon the spinal cord, which is incompletely assessed on the current study. Electronically Signed   By: Kellie Simmering DO   On: 11/26/2019 14:29     Scheduled Meds: . amitriptyline  75 mg Oral QHS  . atorvastatin  80 mg Oral q1800  . Chlorhexidine Gluconate Cloth  6 each Topical Daily  . dexamethasone  4 mg Oral Q12H  . [START ON 11/29/2019] pantoprazole  40 mg Intravenous Q12H  . phytonadione  5 mg Subcutaneous Daily  . sodium chloride flush  3 mL Intravenous Once  . thiamine  100 mg Oral Daily   Continuous Infusions: . ceFEPime (MAXIPIME) IV 2 g (11/27/19 0751)  . metronidazole 500 mg (11/27/19 0520)  . pantoprozole (PROTONIX) infusion 8 mg/hr (11/27/19 0318)  . remdesivir 100 mg in NS 100 mL 100 mg (11/27/19 0908)  . vancomycin 1,500 mg (11/26/19 1846)   PRN Meds:.acetaminophen **OR** acetaminophen, HYDROcodone-acetaminophen, ipratropium-albuterol, ondansetron **OR** ondansetron (ZOFRAN) IV   ASSESMENT:   *   Melena, UGIB BID IV Protonix day 2.    *  Anemia.  On  B12 and iron supplements PTA Hgb 6.3 >> 2 PRBCs >> 8.8 >> 7.1.    *   Left sided colitis per CT  *   Covid 19 +.  Remdesivir, dexamethasone in place.  WBCs 28.3 >> 18.8.    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  Pt left MR spine early.  [facility] APP has seen pt and has reached out to [facility] care.      PLAN   *   Pall care consult pndg.      Azucena Freed  11/27/2019, 12:26 PM Phone [phone]  Agree with Ms. Sandi Carne assessment and plan.  This poor main is likely beyond help as far as treatment to 'fix' anything and I think hospice makes sense if that can be arranged.  I would continue supportive care w/ PPi and IVF  I am sure some of his bleeding at least was due to hypotensive injury to the gut related to sepsis - not sure but do not think GI bleed was a primary issue but rather secondary. Would not do endoscopy at this point and extremely  unlikely otherwise.  Gatha Mayer, MD, [facility]

## 2019-11-27 NOTE — Progress Notes (Addendum)
PROGRESS NOTE  Vincent Dunlap OIB:[id number] DOB: 1940/11/15 DOA: 11/25/2019 PCP: Hendricks Limes, MD   LOS: 2 days   Brief narrative: As per HPI,  Vincent Dunlap is a 79 y.o. 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 EMS was called noted that systolic blood pressure was down to 60s given 500 mL bolus and blood pressure went up to 80s by time patient arrived to emergency department. In ED, patient  was alert and oriented x 3 but disoriented to situation. She could not provide detailed history but denied any chest pain, shortness of breath, abdominal pain. Patient at baseline unable to ambulate. Patient has advanced COPD and continue to be an active smoker until she was admitted to SNF. Prior to admission to SNF patient was drinking 6-7 beers a day. Patient has known history of anemia and been getting B12 and iron supplements. 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   Assessment/Plan:  Active Problems:   Essential hypertension   CAD (coronary artery disease), native coronary artery   Chronic pain syndrome   COPD GOLD II   Sepsis (HCC)   Hypotension   Microcytic anemia   Acute metabolic encephalopathy   Acute kidney injury ([place])   Upper GI bleed   Elevated troponin   Leukocytosis   GI bleed  Upper GI bleed.  Hemoglobin of 6.3 on presentation.  Hemoccult positive.   GI was consulted , on Protonix drip. Received transfusion of 2 units of PRBC for hemoglobin 6.3.   Hemoglobin of 7.1 today.  Elevated lactate on presentation likely secondary to GI bleed and hypotension.   INR 1.6 on 1/19. Continue subq vitamin K daily. Check INR in am. No plans for intervention, so on  dyphagia I diet.  GI on board, further course will depend upon goals of care.  Essential hypertension - will continue to  Hold p.o. medications.  Elevated troponin.  History of CAD/history of coronary artery disease.  Not a candidate for surgical intervention as per previous assessment. No chest pain, no EKG changes reported. In the setting of anemia and hypotension,  likely due to demand ischemia. Monitor on telemetry. Cardiology on board due to significant elevation in troponin. Trend [phone].   2D echocardiogram attempted but no useful information. Will leave to cardiology to decide if further test is warranted. No anticoagulation due to GI bleed.   Chronic pain syndrome -we will closely monitor  COPD GOLD II -continue inhalers oxygen, nebulizers.  Possible sepsis.  WBC at 24.2 on presentation, has slightly decreased to 18.8 today.  But on steroids.  Covid positive, possibility of colitis as well.  Patient had elevated lactic acid, white blood cell count and hypotension. Follow blood cultures for now, on vanco,cefepime and metro at this time.   Blood cultures negative in 2 days.  Latest lactate of 2.2. will repeat lactate today. Consider deescalating antibiotics.   Covid infection - inflammatory markers-pending.  Not hypoxic.  Continue remdesivir.  Patient will be on steroids for possible spinal cord compression. Continue with protonix.  Patient refusing labs  COVID-19 Labs  No results for input(s): DDIMER, FERRITIN, LDH, CRP in the last 72 hours.  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 -Med Oncology and radiation oncology on board.. Continue IV steroids, PPI. Possibility of lung cancer with metastasis since patient was a smoker. Will need to rule out multiple myeloma.  SPEP pending.  If SPEP is negative patient will need a  biopsy if radiation and oncology treatment will be pursued.     Possible colitis treat  with IV antibiotics, appreciate GI input   Hypotension -in the setting of GI bleed, will continue to hold antihypertensives for now.     Microcytic anemia -acute on chronic, but currently worse from baseline, likely secondary to GI bleed.   Acute metabolic encephalopathy -MRI of the brain with restricted diffusion in right precentral gyrus which may be acute infarct or tiny metastasis.  Patient passed swallow screen.  On oral diet.  Dementia.  Continue supportive care.   VTE Prophylaxis: SCD  Code Status:  DNR  Family Communication:  I again spoke with the patient's caregiver Ms. Veronica on the phone today and updated her about the clinical condition of the patient.  Spoke with her that palliative care will be following patient to discuss about goals of care.   Disposition Plan:   Patient is from [facility]. [name], is the caretaker of the patient for several years.  She is interested in healthcare power of attorney for the patient and have consulted transition of care.  I have spoken to her again about palliative care/hospice level of care.  Palliative care has been consulted and pending palliative care consult.  Called palliative care office to informed about consult.  Consultants:  GI  Oncology  Radiation oncology  Palliative care  Procedures:  Transfusion of packed RBC  Antibiotics:  Anti-infectives (From admission, onward)   Start     Dose/Rate Route Frequency Ordered Stop   11/27/19 1000  remdesivir 100 mg in sodium chloride 0.9 % 100 mL IVPB     100 mg 200 mL/hr over 30 Minutes Intravenous Daily 11/26/19 0035 12/01/19 0959   11/26/19 1800  vancomycin (VANCOREADY) IVPB 1500 mg/300 mL     1,500 mg 150 mL/hr over 120 Minutes Intravenous Every 24 hours 11/25/19 2352     11/26/19 0800  ceFEPIme (MAXIPIME) 2 g in sodium chloride 0.9 % 100 mL IVPB     2 g 200  mL/hr over 30 Minutes Intravenous Every 8 hours 11/25/19 2352     11/26/19 0115  remdesivir 200 mg in sodium chloride 0.9% 250 mL IVPB     200 mg 580 mL/hr over 30 Minutes Intravenous Once 11/26/19 0035 11/26/19 0214   11/25/19 2359  vancomycin (VANCOREADY) IVPB 1500 mg/300 mL     1,500 mg 150 mL/hr over 120 Minutes Intravenous  Once 11/25/19 2334 11/26/19 0435   11/25/19 2345  ceFEPIme (MAXIPIME) 2 g in sodium chloride 0.9 % 100 mL IVPB     2 g 200 mL/hr over 30 Minutes Intravenous  Once 11/25/19 2334 11/26/19 0116   11/25/19 2345  metroNIDAZOLE (FLAGYL) IVPB 500 mg     500 mg 100 mL/hr over 60 Minutes Intravenous Every 8 hours 11/25/19 2334       Subjective: Today, patient a little more alert awake communicative.  Hard of hearing.  Poor historian.  Denies pain or shortness of breath.  Nursing staff reported that he was mildly tachycardic.  Objective: Vitals:   11/26/19 1700 11/26/19 1825  BP: 116/62  Pulse: 99 (!) 36  Resp: 12 13  Temp:    SpO2: 100% (!) 83%    Intake/Output Summary (Last 24 hours) at 11/27/2019 0719 Last data filed at 11/26/2019 2000 Gross per 24 hour  Intake 1321.6 ml  Output 2200 ml  Net -878.4 ml   Filed Weights   11/26/19 2102  Weight: 71.8 kg   Body mass index is 22.08 kg/m.   Physical Exam:  GENERAL: Patient is alert awake but poor historian.  More communicative, not in obvious distress.Thinly and Frail appearing.  Chronically ill elderly male.   Has baseline dementia. HENT: Mild scleral pallor noted..Pupils equally reactive to light. Oral mucosa is moist NECK: is supple, no palpable thyroid enlargement. CHEST: Clear to auscultation.  Diminished breath sounds bilaterally. CVS: S1 and S2 heard, .  Mildly tachycardic  ABDOMEN: Soft, non-tender, bowel sounds are present. EXTREMITIES: Moves all extremities.  No edema. CNS: Moving all extremities.  Has baseline dementia.  Answering few questions.  Disoriented SKIN: warm and dry without  rashes.  Data Review: I have personally reviewed the following laboratory data and studies,  CBC: Recent Labs  Lab 11/25/19 2044 11/26/19 0724 11/26/19 1000 11/26/19 1600 11/26/19 2150  WBC 28.3* 21.3* 19.3* 18.3* 24.2*  HGB 6.3* 8.6* 8.2* 8.3* 8.8*  HCT 20.4* 27.1* 25.4* 25.7* 26.8*  MCV 89.5 89.7 89.4 88.3 88.4  PLT 338 336 320 317 315   Basic Metabolic Panel: Recent Labs  Lab 11/25/19 2044 11/26/19 0609  NA 138 141  K 4.4 3.9  CL 108 111  CO2 16* 17*  GLUCOSE 126* 103*  BUN 91* 76*  CREATININE 1.15 0.98  CALCIUM 9.0 8.4*  MG  --  1.7  PHOS  --  4.7*   Liver Function Tests: Recent Labs  Lab 11/25/19 2044 11/26/19 0609  AST 18 26  ALT 10 10  ALKPHOS 102 99  BILITOT 0.7 0.8  PROT 5.4* 5.0*  ALBUMIN 2.4* 2.3*   No results for input(s): LIPASE, AMYLASE in the last 168 hours. No results for input(s): AMMONIA in the last 168 hours. Cardiac Enzymes: No results for input(s): CKTOTAL, CKMB, CKMBINDEX, TROPONINI in the last 168 hours. BNP (last 3 results) No results for input(s): BNP in the last 8760 hours.  ProBNP (last 3 results) No results for input(s): PROBNP in the last 8760 hours.  CBG: Recent Labs  Lab 11/25/19 2048  GLUCAP 116*   Recent Results (from the past 240 hour(s))  Culture, blood (routine x 2)     Status: None (Preliminary result)   Collection Time: 11/25/19  9:09 PM   Specimen: BLOOD RIGHT FOREARM  Result Value Ref Range Status   Specimen Description BLOOD RIGHT FOREARM  Final   Special Requests   Final    BOTTLES DRAWN AEROBIC AND ANAEROBIC Blood Culture results may not be optimal due to an inadequate volume of blood received in culture bottles   Culture   Final    NO GROWTH 2 DAYS Performed at [facility], [place] [address], [place],  [address]    Report Status PENDING  Incomplete  Culture, blood (routine x 2)     Status: None (Preliminary result)   Collection Time: 11/25/19  9:12 PM   Specimen: BLOOD RIGHT WRIST   Result Value Ref Range Status   Specimen Description BLOOD RIGHT WRIST  Final   Special Requests   Final    BOTTLES DRAWN AEROBIC AND ANAEROBIC Blood Culture results may not be optimal due to  an inadequate volume of blood received in culture bottles   Culture   Final    NO GROWTH 2 DAYS Performed at [facility], [place] [address] [place], [place] [address]    Report Status PENDING  Incomplete  Respiratory Panel by RT PCR (Flu A&B, Covid) - Nasopharyngeal Swab     Status: Abnormal   Collection Time: 11/25/19 10:13 PM   Specimen: Nasopharyngeal Swab  Result Value Ref Range Status   SARS Coronavirus 2 by RT PCR POSITIVE (A) NEGATIVE Final    Comment: RESULT CALLED TO, READ BACK BY AND VERIFIED WITH: B. ORORIO,RN 2348 11/25/2019 T. TYSOR (NOTE) SARS-CoV-2 target nucleic acids are DETECTED. SARS-CoV-2 RNA is generally detectable in upper respiratory specimens  during the acute phase of infection. Positive results are indicative of the presence of the identified virus, but do not rule out bacterial infection or co-infection with other pathogens not detected by the test. Clinical correlation with patient history and other diagnostic information is necessary to determine patient infection status. The expected result is Negative. Fact Sheet for Patients:  PinkCheek.be Fact Sheet for Healthcare Providers: GravelBags.it This test is not yet approved or cleared by the [place] FDA and  has been authorized for detection and/or diagnosis of SARS-CoV-2 by FDA under an Emergency Use Authorization (EUA).  This EUA will remain in effect (meaning this test can be used)  for the duration of  the COVID-19 declaration under Section 564(b)(1) of the Act, 21 U.S.C. section 360bbb-3(b)(1), unless the authorization is terminated or revoked sooner.    Influenza A by PCR NEGATIVE NEGATIVE Final   Influenza B by PCR NEGATIVE NEGATIVE Final     Comment: (NOTE) The Xpert Xpress SARS-CoV-2/FLU/RSV assay is intended as an aid in  the diagnosis of influenza from Nasopharyngeal swab specimens and  should not be used as a sole basis for treatment. Nasal washings and  aspirates are unacceptable for Xpert Xpress SARS-CoV-2/FLU/RSV  testing. Fact Sheet for Patients: PinkCheek.be Fact Sheet for Healthcare Providers: GravelBags.it This test is not yet approved or cleared by the [place] FDA and  has been authorized for detection and/or diagnosis of SARS-CoV-2 by  FDA under an Emergency Use Authorization (EUA). This EUA will remain  in effect (meaning this test can be used) for the duration of the  Covid-19 declaration under Section 564(b)(1) of the Act, 21  U.S.C. section 360bbb-3(b)(1), unless the authorization is  terminated or revoked. Performed at [facility], [place] [address] [place], [place] [address]   Urine culture     Status: None   Collection Time: 11/26/19  3:10 AM   Specimen: Urine, Random  Result Value Ref Range Status   Specimen Description URINE, RANDOM  Final   Special Requests NONE  Final   Culture   Final    NO GROWTH Performed at [facility], [place] [address] [place], [place] [address]    Report Status 11/27/2019 FINAL  Final     Studies: CT ANGIO CHEST PE W OR WO CONTRAST  Result Date: 11/26/2019 CLINICAL DATA:  Dyspnea, chronic, unclear etiology COVID positive. EXAM: CT ANGIOGRAPHY CHEST WITH CONTRAST TECHNIQUE: Multidetector CT imaging of the chest was performed using the standard protocol during bolus administration of intravenous contrast. Multiplanar CT image reconstructions and MIPs were obtained to evaluate the vascular anatomy. CONTRAST:  188m OMNIPAQUE IOHEXOL 350 MG/ML SOLN COMPARISON:  Chest radiograph yesterday. Chest CT 10/18/2017 FINDINGS: Cardiovascular: There are no filling defects within the pulmonary  arteries to  suggest pulmonary embolus. Subsegmental branches are not well assessed. Aortic atherosclerosis. Cannot assess for dissection given phase of contrast bolus timing tailored to pulmonary artery evaluation. Coronary artery calcifications versus stents. Heart is normal in size. No pericardial effusion. Mediastinum/Nodes: Small mediastinal lymph nodes, all subcentimeter short axis. Largest node is prevascular measuring 9 mm. No hilar adenopathy. No esophageal wall thickening. No visualized thyroid nodule. 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 Minimal dependent atelectasis in the left lower lobe. Calcified granuloma in the right middle lobe. No noncalcified nodules. No pulmonary mass. No focal airspace disease. No ground-glass opacity typical of COVID pneumonia. No pleural fluid. Upper Abdomen: Assessed on abdominal CT yesterday. Musculoskeletal: Expansile lytic lesion involving the right posterolateral seventh rib with bony destruction. Lytic lesion involving posterior elements of T7 extend into and may obliterate spinal canal. Lesion extends to involve the inferior aspect of T6 spinous process. L1 vertebral body lesion was assessed on abdominal CT yesterday. Review of the MIP images confirms the above findings. IMPRESSION: 1. No pulmonary embolus. 2. 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 No evidence of acute pneumonia or primary bronchogenic malignancy. 3. Expansile lytic lesions involving T7 vertebral body which likely extends to and likely obliterates the spinal canal. Destructive lytic lesion involving right posterolateral seventh rib. In conjunction with bone lesions in the lumbar spine and pelvis, findings may represent metastatic disease or multiple myeloma. 4. Aortic atherosclerosis. Coronary artery calcifications versus stents. Aortic Atherosclerosis (ICD10-I70.0) and Emphysema (ICD10-J43.9). Electronically Signed   By: Keith Rake M.D.   On: 11/26/2019 03:59   MR BRAIN WO CONTRAST  Result Date:  11/26/2019 CLINICAL DATA:  Metastatic disease evaluation. CAD, ischemic cardiomyopathy EXAM: MRI HEAD WITHOUT CONTRAST TECHNIQUE: Multiplanar, multiecho pulse sequences of the brain and surrounding structures were obtained without intravenous contrast. COMPARISON:  Head CT 09/28/2018 FINDINGS: Please note the examination was terminated prior to postcontrast imaging at the patient's request. Brain: There is limited evaluation for intracranial metastatic disease on this noncontrast study. Punctate focus of restricted diffusion within the right precentral gyrus (series 5, image 43) (series 500, image 43). This may reflect a punctate acute infarct or tiny metastasis. 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 No significant cerebral white matter disease. There is mild ill-defined T2/FLAIR hyperintensity within the pons which is nonspecific, but consistent with chronic small vessel ischemic disease. Moderate generalized parenchymal atrophy. No midline shift, extra-axial fluid collection or evidence of hydrocephalus Vascular: Flow voids maintained within the proximal large arterial vessels. Skull and upper cervical spine: No focal marrow lesion Sinuses/Orbits: Visualized orbits demonstrate no acute abnormality. No significant paranasal sinus disease. Bilateral mastoid effusions (greater on the right). IMPRESSION: Please note that the examination was terminated prior to postcontrast imaging at the patient's request. This limits evaluation for intracranial metastatic disease. Punctate focus of restricted diffusion within the right precentral gyrus, which may reflect an acute infarct or tiny metastasis. Contrast-enhanced brain MRI is recommended for further evaluation. 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 Mild chronic small vessel ischemic disease within the pons. Moderate generalized parenchymal atrophy. Electronically Signed   By: Kellie Simmering DO   On:  11/26/2019 13:34   CT Abdomen Pelvis W Contrast  Result Date: 11/26/2019 CLINICAL DATA:  GI bleed. EXAM: CT ABDOMEN AND PELVIS WITH CONTRAST TECHNIQUE: Multidetector CT imaging of the abdomen and pelvis was performed using the standard protocol following bolus administration of intravenous contrast. CONTRAST:  132m OMNIPAQUE IOHEXOL 300 MG/ML  SOLN COMPARISON:  Pelvis  CT 09/28/2018. No prior abdominal imaging. FINDINGS: Lower chest: Breathing motion artifact. No pleural fluid. Heart is normal in size. There are coronary artery calcifications. Hepatobiliary: Small subcentimeter low-density in the central liver is too small to characterize but likely small cyst or hemangioma. Elongated gallbladder. No pericholecystic inflammation. No calcified gallstone. No biliary dilatation. Pancreas: Parenchymal atrophy. No ductal dilatation or inflammation. No evidence pancreatic mass. Spleen: Normal in size without focal abnormality. Trace perisplenic fluid. Adrenals/Urinary Tract: No adrenal nodule. Cortical scarring in the upper left kidney. No hydronephrosis. Low-density lesions in the left kidney are too small to characterize but likely cysts. No evidence of solid renal mass. Absent excretion on delayed phase imaging. Urinary bladder is distended. Mild bladder wall thickening at the base which appears similar to prior exam. The bladder appears slightly trabeculated. Stomach/Bowel: Motion and lack of enteric contrast limits gastric evaluation. There is equivocal gastric wall thickening about the proximal stomach. Small bowel is decompressed. No bowel obstruction or small bowel inflammation. Sigmoid colonic wall thickening with pericolonic edema, moderate length segment. Colonic diverticulosis without evidence of focal diverticulitis. No obvious colonic mass. The appendix is not confidently visualized. Vascular/Lymphatic: Severe aortic atherosclerosis. Advanced branch and peripheral atherosclerosis. Dense calcification at  the origin of the celiac artery. Proximal mesenteric vessels are patent. Portal vein is patent. No enlarged lymph nodes in the abdomen or pelvis. Reproductive: Prostate gland not enlarged. Other: Trace perisplenic free fluid. No free air. Musculoskeletal: Multiple lytic lesions involving the lumbar spine and bony pelvis. L1 lesion involves the vertebral body, right pedicle and lamina with extraosseous extension, there is mass effect and invasion of the spinal canal. Pathologic fracture with infiltrated lesion of L3 vertebral body with soft tissue extension peripherally into the bilateral psoas muscles. Posterior extension into the spinal canal. Expansile lucent lesion involving the right anterior iliac bone, this is at site of previous cortical irregularity. Permeative expansile lesion involving the left posterior acetabulum and ischium. Lytic lesion involving the left pubic body. IMPRESSION: 1. Sigmoid colonic wall thickening with pericolonic edema, suspicious for colitis. This may be infectious or inflammatory, and likely cause of patient's GI bleed. Neoplastic involvement is felt less likely given length of involvement but not entirely excluded. 2. Multiple lytic lesions involving the lumbar spine and bony pelvis. Findings may represent metastatic disease or multiple myeloma. There is extraosseous extension at L1 and L3 with mass effect and invasion of the spinal canal. Expansile lytic lesions in the right iliac bone is at site of previous cortical irregularity. Permeative lytic lesion involving the posterior left acetabulum and left pubic body. Given history of emphysema, consider further evaluation with chest CT to evaluate for pulmonary mass (preferably with IV contrast to assess for hilar regions). 3. Distended urinary bladder with mild bladder wall thickening at the base and suggestion of trabeculation, similar to prior exam. 4. Equivocal gastric wall thickening about the proximal stomach. 5. Advanced aortic  and branch atherosclerosis. Severe peripheral vascular disease. Aortic Atherosclerosis (ICD10-I70.0). Electronically Signed   By: Keith Rake M.D.   On: 11/26/2019 00:57   DG Chest Port 1 View  Result Date: 11/25/2019 CLINICAL DATA:  Weakness EXAM: PORTABLE CHEST 1 VIEW COMPARISON:  Radiograph 02/10/2018, CT 10/18/2017 FINDINGS: Background of diffusely coarsened interstitial changes in the lungs which may be accentuated by low lung volumes. No focal consolidative opacity. No convincing features of edema. The aorta is calcified. The remaining cardiomediastinal contours are unremarkable. No acute osseous or soft tissue abnormality. Degenerative changes are present in the imaged  spine and shoulders. The osseous structures appear diffusely demineralized which may limit detection of small or nondisplaced fractures. IMPRESSION: Background of chronic interstitial changes and emphysema likely accentuated by low lung volumes. No definite acute cardiopulmonary abnormality. Aortic Atherosclerosis (ICD10-I70.0). Electronically Signed   By: Lovena Le M.D.   On: 11/25/2019 21:31   MR TOTAL SPINE METS SCREENING  Result Date: 11/26/2019 CLINICAL DATA:  Metastatic disease evaluation for known metastasis at T7 and in lumbar spine, concerning for cord compression; metastatic disease evaluation. EXAM: MRI TOTAL SPINE WITHOUT AND WITH CONTRAST TECHNIQUE: Multisequence MR imaging of the spine without intravenous contrast. Please note the examination was prematurely terminated at the patient's request. Only localizer sequences, as well as well as a sagittal T1 weighted sequence extending from the level of the skull base to a level through the upper T8 vertebral body, were obtained. COMPARISON:  CT chest/abdomen/pelvis 11/26/2019 FINDINGS: Incompletely assessed cervical spondylosis with a C5-C6 posterior disc osteophyte complex partially effacing the ventral thecal sac and contacting the ventral spinal cord. A small  posterior disc osteophyte complex is also present at C6-C7. No suspicious osseous lesion is identified within the cervical spine on the acquired sagittal T1 weighted imaging. Within the thoracic spine, there is a partially imaged mass eroding the T6 and T7 posterior elements. The caudal extent of the mass is not included in the field of view. The mass encroaches upon the dorsal aspect of the spinal canal contributing to at least moderate spinal canal stenosis. Suspected mass effect upon the spinal cord, which is incompletely assessed on the current study. T5 vertebral body hemangioma. No other suspicious osseous lesions identified at the imaged thoracic levels. IMPRESSION: Please note the examination was prematurely terminated at the patient's request. Scout localizer imaging was obtained. A sagittal T1 weighted sequence extending from the level of the skull base to a level through the upper T8 vertebra was also obtained. Partially imaged mass eroding the T6 and T7 posterior elements. The caudal extent of the mass is not included in the field of view. 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 There is mass effect upon the spinal cord, which is incompletely assessed on the current study. Electronically Signed   By: Kellie Simmering DO   On: 11/26/2019 14:29   ECHOCARDIOGRAM LIMITED  Result Date: 11/26/2019   ECHOCARDIOGRAM REPORT   Patient Name:   ILIA ENGELBERT [facility] Date of Exam: 11/26/2019 Medical Rec #:  [id number]    Height:       71.5 in Accession #:    [id number]   Weight:       172.0 lb Date of Birth:  September 11, 1941    BSA:          1.99 m Patient Age:    57 years     BP:           105/58 mmHg Patient Gender: M            HR:           94 bpm. Exam Location:  Inpatient Procedure: Limited Echo Indications:    Elevated Troponin  History:        Patient has prior history of Echocardiogram examinations, most                 recent 09/29/2018. CHF  and Cardiomyopathy, CAD, COPD and PVD,  Signs/Symptoms:Altered Mental Status; Risk Factors:Hypertension                 and Current Smoker. Cvoid positive.  Sonographer:    Dustin Flock Referring Phys: [phone] ANASTASSIA DOUTOVA  Sonographer Comments: Suboptimal parasternal window, suboptimal apical window and suboptimal subcostal window. Image acquisition challenging due to COPD. IMPRESSIONS  1. No images of the heart available from the study except can see that the IVC is not dilated. No windows available at all for echo. Consider TEE or cardiac MRI. FINDINGS  Left Ventricle: Left ventricular ejection fraction, by visual estimation, is Unable to comment%. The left ventricle has Unable to comment function. The left ventricle is not well visualized. There is no left ventricular hypertrophy. No images of the heart available from the study except can see that the IVC is not dilated. Right Ventricle: The right ventricular size is not well visualized. Right vetricular wall thickness was not assessed. Global RV systolic function is was not well visualized. Left Atrium: Left atrial size was not well visualized. Right Atrium: Right atrial size was not well visualized Pericardium: There is no evidence of pericardial effusion. Mitral Valve: The mitral valve was not well visualized. Unable to comment mitral valve regurgitation. Tricuspid Valve: The tricuspid valve is not well visualized. Tricuspid valve regurgitation Unable to comment. Aortic Valve: The aortic valve was not well visualized. Aortic valve regurgitation Unable to comment. Pulmonic Valve: The pulmonic valve was not well visualized. Pulmonic valve regurgitation is not visualized. Pulmonic regurgitation is not visualized. Aorta: The aortic root was not well visualized. Venous: The inferior vena cava is normal in size with greater than 50% respiratory variability, suggesting right atrial pressure of 3 mmHg. IAS/Shunts: The interatrial septum was not  well visualized.  Loralie Champagne MD Electronically signed by Loralie Champagne MD Signature Date/Time: 11/26/2019/1:46:27 PM    Final     Scheduled Meds: . amitriptyline  75 mg Oral QHS  . atorvastatin  80 mg Oral q1800  . Chlorhexidine Gluconate Cloth  6 each Topical Daily  . dexamethasone  4 mg Oral Q12H  . [START ON 11/29/2019] pantoprazole  40 mg Intravenous Q12H  . sodium chloride flush  3 mL Intravenous Once  . thiamine  100 mg Oral Daily    Continuous Infusions: . ceFEPime (MAXIPIME) IV 2 g (11/26/19 2324)  . metronidazole 500 mg (11/27/19 0520)  . pantoprozole (PROTONIX) infusion 8 mg/hr (11/27/19 0318)  . remdesivir 100 mg in NS 100 mL    . vancomycin 1,500 mg (11/26/19 1846)     Flora Lipps, MD  [facility] 11/27/2019

## 2019-11-27 NOTE — Consult Note (Signed)
Consultation Note Date: 11/27/2019   Patient Name: Vincent Dunlap  DOB: 08-17-1941  MRN: [id number]  Age / Sex: 79 y.o., male  PCP: Hendricks Limes, MD Referring Physician: Flora Lipps, MD  Reason for Consultation: Establishing goals of care and Psychosocial/spiritual support    HPI/Patient Profile: 79 y.o. 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   Continued slow physical,  functional and cognitive decline over the past year per facility staff.  Admitted from SNF with altered mental status and increased weakness.  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        Unknown primary  Med Oncology and radiation oncology consulted  Patient is confused and without medical decsional capacity.  [name] shares that the patient was a heavy drinker/p "pill user" prior to admission to SNF   Pending decision regarding treatment option decisions, advanced directive decisions and anticipatory care needs.  Clinical Assessment and Goals of Care:   This NP Wadie Lessen reviewed medical records, received report from team, spoke to Worthy Flank PA-C with Rad-Onc, discussed patient with DON at [facility] and dicussed with Dr Gustavo Lah and bedside nursing.   I then spoke by phone with [name].  Verdene Lennert tells me she has known patient for ten plus years.  She started out as a caregiver and remains his friend and support person over the past  years.  Verdene Lennert tells me there is a son/ Vincent Dunlap  but is estranged, patitn and son have not spoken for "20 years"     She  gave me phone number which I did call/multiple times but have not received a return call at this time.  In speaking with  Tee/ [place]/ DON she too tells me the patient has not had any other person of interest /except [place], visit or enquire about about this patient since his admission over a year ago.  A  discussion was had today regarding advanced directives.  Concepts specific to code status, artifical feeding and hydration, continued IV antibiotics and rehospitalization was had.  The difference between a aggressive medical intervention path  and a palliative comfort care path for this patient at this time was had.  Values and goals of care important to patient and family were attempted to be elicited.  MOST form discussed, Verdene Lennert will meet with me tomorrow afternoon at 2:30 for ongoing discussion and completion of MOST  Natural trajectory and expectations at EOL were discussed.  Questions and concerns addressed.   Family encouraged to call with questions or concerns.    PMT will continue to support holistically.   No documented HPOA or AD on file.   I am doing due diligence in tracing and contacting persons of interest.  SUMMARY OF RECOMMENDATIONS    Code Status/Advance Care Planning:  DNR- previously docuemnted     Primary Diagnoses: Present on Admission: . Essential hypertension . CAD (coronary artery disease), native coronary artery . Chronic pain syndrome . COPD GOLD II . Sepsis ([place]) . Hypotension . Microcytic anemia . Acute metabolic encephalopathy . Acute kidney injury ([place]) . Upper GI bleed . Elevated troponin . Leukocytosis . GI bleed   I have reviewed the medical record, interviewed the patient and family, and examined the patient. The following aspects are pertinent.  Past Medical History:  Diagnosis Date  . CAD (coronary artery disease) 07/2016   3 vessel disease,  evaluated by Dr Servando Snare and felt to be a poor surgical candidate  .  Chronic systolic CHF (congestive heart failure) ([place])   . Hyperlipidemia LDL goal <70   . Hypertension   . Ischemic cardiomyopathy 2017   EF 30%  . Tobacco abuse    Social History   Socioeconomic History  . Marital status: Single    Spouse name: Not on file  . Number of children: Not on file  . Years of education: Not on file  . Highest education level: Not on file  Occupational History  . Not on file  Tobacco Use  . Smoking status: Former Smoker    Packs/day: 1.00    Types: Cigarettes    Quit date: 09/08/2017    Years since quitting: 2.2  . Smokeless tobacco: Former Network engineer and Sexual Activity  . Alcohol use: No    Comment: Former heavy binge drinker until 2014  . Drug use: No  . Sexual activity: Not on file  Other Topics Concern  . Not on file  Social History Narrative   Patient is a resident at [facility]   He is single   Scientist, physiological Strain:   . Difficulty of Paying Living Expenses: Not on file  Food Insecurity:   . Worried About Charity fundraiser in the Last Year: Not on file  . Ran Out of Food in the Last Year: Not on file  Transportation Needs:   . Lack of Transportation (Medical): Not on file  . Lack of Transportation (Non-Medical): Not on file  Physical Activity:   . Days of Exercise per Week: Not on file  . Minutes of Exercise per Session: Not on file  Stress:   . Feeling of Stress : Not on file  Social Connections:   . Frequency of Communication with Friends and Family: Not on file  . Frequency of Social Gatherings with Friends and Family: Not on file  . Attends Religious Services: Not on file  . Active Member of Clubs or Organizations: Not on file  . Attends Archivist Meetings: Not on file  . Marital Status: Not on file   Family History  Problem Relation Age of Onset  . Alzheimer's disease Mother   . Diabetes Mother        Diabetes in the maternal side of the family but not in  mother  . Heart disease Neg Hx   . Cancer Neg Hx    Scheduled Meds: . amitriptyline  75 mg Oral QHS  . atorvastatin  80 mg Oral q1800  . Chlorhexidine Gluconate Cloth  6 each Topical Daily  . dexamethasone  4 mg Oral Q12H  . [START ON 11/29/2019] pantoprazole  40 mg Intravenous Q12H  . phytonadione  5 mg Subcutaneous Daily  . sodium  chloride flush  3 mL Intravenous Once  . thiamine  100 mg Oral Daily   Continuous Infusions: . ceFEPime (MAXIPIME) IV 2 g (11/27/19 0751)  . metronidazole 500 mg (11/27/19 0520)  . pantoprozole (PROTONIX) infusion 8 mg/hr (11/27/19 0318)  . remdesivir 100 mg in NS 100 mL 100 mg (11/27/19 0908)  . vancomycin 1,500 mg (11/26/19 1846)   PRN Meds:.acetaminophen **OR** acetaminophen, HYDROcodone-acetaminophen, ipratropium-albuterol, ondansetron **OR** ondansetron (ZOFRAN) IV Medications Prior to Admission:  Prior to Admission medications   Medication Sig Start Date End Date Taking? Authorizing Provider  acetaminophen (TYLENOL) 500 MG tablet Take 500 mg by mouth every 4 (four) hours as needed for mild pain or moderate pain.   Yes [provider]  amitriptyline (ELAVIL) 75 MG tablet Take 75 mg by mouth at bedtime.   Yes [provider]  aspirin 81 MG chewable tablet Chew 1 tablet (81 mg total) by mouth daily. 07/31/17  Yes Barton Dubois, MD  atorvastatin (LIPITOR) 80 MG tablet Take 1 tablet (80 mg total) by mouth daily at 6 PM. 07/25/16  Yes Regalado, Belkys A, MD  bisacodyl (DULCOLAX) 10 MG suppository Place 1 suppository (10 mg total) rectally daily as needed for severe constipation. 07/30/17  Yes Barton Dubois, MD  carvedilol (COREG) 6.25 MG tablet Take 1 tablet (6.25 mg total) by mouth 2 (two) times daily with a meal. 07/25/16  Yes Regalado, Belkys A, MD  Cholecalciferol (DIALYVITE VITAMIN D3 MAX) 29562 units TABS Take 1 tablet by mouth every 30 (thirty) days. 05/28/18  Yes [provider]  clopidogrel (PLAVIX) 75 MG tablet Take 1  tablet (75 mg total) by mouth daily with breakfast. 07/31/17  Yes Barton Dubois, MD  desvenlafaxine (PRISTIQ) 50 MG 24 hr tablet Take 50 mg by mouth daily.   Yes [provider]  gabapentin (NEURONTIN) 100 MG capsule Take 400 mg by mouth 3 (three) times daily.    Yes [provider]  ipratropium-albuterol (DUONEB) 0.5-2.5 (3) MG/3ML SOLN Take 3 mLs by nebulization 4 (four) times daily as needed (SOB).    Yes [provider]  isosorbide mononitrate (IMDUR) 30 MG 24 hr tablet Take 1 tablet (30 mg total) by mouth daily. 07/31/17  Yes Barton Dubois, MD  loratadine (CLARITIN) 10 MG tablet Take 10 mg by mouth daily.   Yes [provider]  nitroGLYCERIN (NITROSTAT) 0.4 MG SL tablet Place 1 tablet (0.4 mg total) under the tongue every 5 (five) minutes as needed for chest pain. 07/25/16  Yes Regalado, Belkys A, MD  senna-docusate (SENOKOT-S) 8.6-50 MG tablet Take 2 tablets by mouth at bedtime as needed for mild constipation.    Yes [provider]  thiamine 100 MG tablet Take 1 tablet (100 mg total) by mouth daily. 10/21/17  Yes Gherghe, Vella Redhead, MD  furosemide (LASIX) 20 MG tablet Take 1 tablet (20 mg total) by mouth daily as needed for fluid or edema (weight gain 3 lbs in 24 hours or 5 lbs in 7 days.). Patient not taking: Reported on 11/25/2019 10/03/18   Arrien, Jimmy Picket, MD   Allergies  Allergen Reactions  . Chantix [Varenicline] Other (See Comments)    Pt states it made him "crazy"   Review of Systems  Unable to perform ROS: Mental status change    Physical Exam   No physical exam performed 2/2 Covid-19 restrictions and attempt to reduce use of PPE  Vital Signs: BP (!) 93/55 (BP Location: Right Arm)   Pulse (!) 117   Temp  98.3 F (36.8 C) (Axillary)   Resp 17   Ht 5\' 11"  (1.803 m)   Wt 71.8 kg   SpO2 100%   BMI 22.08 kg/m  Pain Scale: PAINAD   Pain Score: 2    SpO2: SpO2: 100 % O2 Device:SpO2: 100 % O2 Flow Rate: .O2 Flow Rate  (L/min): 2 L/min  IO: Intake/output summary:   Intake/Output Summary (Last 24 hours) at 11/27/2019 1213 Last data filed at 11/26/2019 2000 Gross per 24 hour  Intake 1221.6 ml  Output 900 ml  Net 321.6 ml    LBM: Last BM Date: 11/26/19 Baseline Weight: Weight: 71.8 kg Most recent weight: Weight: 71.8 kg     Palliative Assessment/Data: 30 %   Discussed with Dr Louanne Belton  I will meet tomorrow with Verdene Lennert to clrify GOCs and complete MOST form.  Time In: 1230 Time Out: 1340 Time Total: 70 minutes Greater than 50%  of this time was spent counseling and coordinating care related to the above assessment and plan.  Signed by: Wadie Lessen, NP   Please contact Palliative Medicine Team phone at [phone] for questions and concerns.  For individual provider: See Shea Evans

## 2019-11-28 DIAGNOSIS — Z515 Encounter for palliative care: Secondary | ICD-10-CM

## 2019-11-28 DIAGNOSIS — R531 Weakness: Secondary | ICD-10-CM

## 2019-11-28 DIAGNOSIS — R52 Pain, unspecified: Secondary | ICD-10-CM

## 2019-11-28 DIAGNOSIS — U071 COVID-19: Secondary | ICD-10-CM

## 2019-11-28 LAB — COMPREHENSIVE METABOLIC PANEL
ALT: 17 U/L (ref 0–44)
AST: 76 U/L — ABNORMAL HIGH (ref 15–41)
Albumin: 2.6 g/dL — ABNORMAL LOW (ref 3.5–5.0)
Alkaline Phosphatase: 92 U/L (ref 38–126)
Anion gap: 13 (ref 5–15)
BUN: 25 mg/dL — ABNORMAL HIGH (ref 8–23)
CO2: 17 mmol/L — ABNORMAL LOW (ref 22–32)
Calcium: 8.9 mg/dL (ref 8.9–10.3)
Chloride: 106 mmol/L (ref 98–111)
Creatinine, Ser: 0.77 mg/dL (ref 0.61–1.24)
GFR calc Af Amer: >60 mL/min (ref >60)
GFR calc non Af Amer: >60 mL/min (ref >60)
Glucose, Bld: 98 mg/dL (ref 70–99)
Potassium: 3.6 mmol/L (ref 3.5–5.1)
Sodium: 136 mmol/L (ref 135–145)
Total Bilirubin: 0.8 mg/dL (ref 0.3–1.2)
Total Protein: 5.5 g/dL — ABNORMAL LOW (ref 6.5–8.1)

## 2019-11-28 LAB — CBC WITH DIFFERENTIAL/PLATELET
Abs Immature Granulocytes: 0.41 10*3/uL — ABNORMAL HIGH (ref 0.00–0.07)
Basophils Absolute: 0 10*3/uL (ref 0.0–0.1)
Basophils Relative: 0 %
Eosinophils Absolute: 0 10*3/uL (ref 0.0–0.5)
Eosinophils Relative: 0 %
HCT: 24.4 % — ABNORMAL LOW (ref 39.0–52.0)
Hemoglobin: 7.9 g/dL — ABNORMAL LOW (ref 13.0–17.0)
Immature Granulocytes: 2 %
Lymphocytes Relative: 9 %
Lymphs Abs: 1.9 10*3/uL (ref 0.7–4.0)
MCH: 29.4 pg (ref 26.0–34.0)
MCHC: 32.4 g/dL (ref 30.0–36.0)
MCV: 90.7 fL (ref 80.0–100.0)
Monocytes Absolute: 0.9 10*3/uL (ref 0.1–1.0)
Monocytes Relative: 4 %
Neutro Abs: 18.3 10*3/uL — ABNORMAL HIGH (ref 1.7–7.7)
Neutrophils Relative %: 85 %
Platelets: 361 10*3/uL (ref 150–400)
RBC: 2.69 MIL/uL — ABNORMAL LOW (ref 4.22–5.81)
RDW: 22.1 % — ABNORMAL HIGH (ref 11.5–15.5)
WBC: 21.6 10*3/uL — ABNORMAL HIGH (ref 4.0–10.5)
nRBC: 0.3 % — ABNORMAL HIGH (ref 0.0–0.2)

## 2019-11-28 LAB — FERRITIN: Ferritin: 164 ng/mL (ref 24–336)

## 2019-11-28 LAB — C-REACTIVE PROTEIN: CRP: 2.4 mg/dL — ABNORMAL HIGH (ref <1.0)

## 2019-11-28 LAB — D-DIMER, QUANTITATIVE: D-Dimer, Quant: 0.8 ug/mL-FEU — ABNORMAL HIGH (ref 0.00–0.50)

## 2019-11-28 LAB — PROCALCITONIN: Procalcitonin: <0.1 ng/mL

## 2019-11-28 LAB — PHOSPHORUS: Phosphorus: 2 mg/dL — ABNORMAL LOW (ref 2.5–4.6)

## 2019-11-28 LAB — MAGNESIUM: Magnesium: 1.7 mg/dL (ref 1.7–2.4)

## 2019-11-28 MED ORDER — LORAZEPAM 1 MG PO TABS: 1.0000 mg | ORAL_TABLET | ORAL | Status: DC | PRN

## 2019-11-28 MED ORDER — MORPHINE SULFATE (CONCENTRATE) 10 MG/0.5ML PO SOLN: 5.0000 mg | ORAL | Status: DC | PRN

## 2019-11-28 NOTE — Progress Notes (Signed)
Progress Note    Vincent Dunlap  XKG:[id number] DOB: Sep 01, 1941  DOA: 11/25/2019 PCP: Hendricks Limes, MD    Brief Narrative:     Medical records reviewed and are as summarized below:  Vincent Dunlap is an 79 y.o. male who was admitted with hypotension, anemia, and altered mental status.  He was found to be Covid positive.  His stay has been complicated by GI bleed, elevated troponin, and numerous lytic bone lesions.  Assessment/Plan:   Active Problems:   Essential hypertension   CAD (coronary artery disease), native coronary artery   Chronic pain syndrome   COPD GOLD II   Sepsis (HCC)   Hypotension   Microcytic anemia   Acute metabolic encephalopathy   Acute kidney injury ([place])   Upper GI bleed   Elevated troponin   Leukocytosis   GI bleed   Acute blood loss anemia   Palliative care by specialist   Overall poor prognosis and appreciate palliative care's assistance with this patient's plans.  I suspect comfort care is most appropriate   Upper GI bleed.   -Hemoglobin of 6.3 on presentation.   -Hemoccult positive.    -GI consulted- suspect GI bleed with not primary issues but secondary due to hypotension -s/p 2 units of PRBC for hemoglobin  Essential hypertension -resume home meds as able-- was hypotensive with GI bleed  Elevated troponin.  History of CAD/history of coronary artery disease.  Not a candidate for surgical intervention as per previous assessment -likely due to demand ischemia.    Chronic pain syndrome  -treat  COPDGOLD II  -continue inhalers oxygen, nebulizers.  Covid infection -Not hypoxic.   -Continue remdesivir.   -Patient will be on steroids for possible spinal cord compression.  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 -Med Oncology and radiation oncology on board - Possibility of lung cancer with metastasis since patient was a smoker -palliative care  consult-- patient should transition to comfort care   Microcytic anemia -acute on chronic, but currently worse from baseline, likely secondary to GI bleed.  Acute metabolic encephalopathy -MRI of the brain with restricted diffusion in right precentral gyrus which may be acute infarct or tiny metastasis.    Dementia.   -Continue supportive care.    Family Communication/Anticipated D/C date and plan/Code Status   DVT prophylaxis: scd Code Status: dnr  Family Communication: meeting with palliative care Disposition Plan: recommend transition to hospice and back to SNF with palliative care and comfort focused   Medical Consultants:    GI  Palliative care  Cards  oncology     Subjective:   Watching movies on TV, no complaints- smiles when asked a question  Objective:    Vitals:   11/28/19 0011 11/28/19 0318 11/28/19 0800 11/28/19 0937  BP: 99/60  (!) 96/57 103/61  Pulse:   82 (!) 109  Resp:   18 15  Temp: 97.9 F (36.6 C) 97.8 F (36.6 C) 98.6 F (37 C) 98.7 F (37.1 C)  TempSrc: Axillary Axillary Axillary Axillary  SpO2:   (!) 86% 100%  Weight:      Height:        Intake/Output Summary (Last 24 hours) at 11/28/2019 1200 Last data filed at 11/28/2019 0801 Gross per 24 hour  Intake --  Output 2050 ml  Net -2050 ml   Filed Weights   11/26/19 2102  Weight: 71.8 kg    Exam: In bed, NAD Pleasant-- smiles when  asked questions rrr No increased work of breathing  Data Reviewed:   I have personally reviewed following labs and imaging studies:  Labs: Labs show the following:   Basic Metabolic Panel: Recent Labs  Lab 11/25/19 2044 11/25/19 2044 11/26/19 0609 11/26/19 0609 11/27/19 0823 11/28/19 0343  NA 138  --  141  --  142 136  K 4.4   < > 3.9   < > 3.6 3.6  CL 108  --  111  --  115* 106  CO2 16*  --  17*  --  16* 17*  GLUCOSE 126*  --  103*  --  97 98  BUN 91*  --  76*  --  32* 25*  CREATININE 1.15  --  0.98  --  0.69 0.77   CALCIUM 9.0  --  8.4*  --  8.1* 8.9  MG  --   --  1.7  --  1.5* 1.7  PHOS  --   --  4.7*  --  2.7 2.0*   < > = values in this interval not displayed.   GFR Estimated Creatinine Clearance: 77.3 mL/min (by C-G formula based on SCr of 0.77 mg/dL). Liver Function Tests: Recent Labs  Lab 11/25/19 2044 11/26/19 0609 11/27/19 0823 11/28/19 0343  AST 18 26 33 76*  ALT 10 10 12 17   ALKPHOS 102 99 86 92  BILITOT 0.7 0.8 0.8 0.8  PROT 5.4* 5.0* 4.7* 5.5*  ALBUMIN 2.4* 2.3* 2.1* 2.6*   No results for input(s): LIPASE, AMYLASE in the last 168 hours. No results for input(s): AMMONIA in the last 168 hours. Coagulation profile Recent Labs  Lab 11/25/19 2133 11/26/19 1600 11/27/19 0823  INR 2.1* 1.8* 1.6*    CBC: Recent Labs  Lab 11/26/19 1000 11/26/19 1600 11/26/19 2150 11/27/19 0823 11/28/19 0343  WBC 19.3* 18.3* 24.2* 18.8* 21.6*  NEUTROABS  --   --   --  15.3* 18.3*  HGB 8.2* 8.3* 8.8* 7.1* 7.9*  HCT 25.4* 25.7* 26.8* 21.8* 24.4*  MCV 89.4 88.3 88.4 89.3 90.7  PLT 320 317 256 312 361   Cardiac Enzymes: No results for input(s): CKTOTAL, CKMB, CKMBINDEX, TROPONINI in the last 168 hours. BNP (last 3 results) No results for input(s): PROBNP in the last 8760 hours. CBG: Recent Labs  Lab 11/25/19 2048  GLUCAP 116*   D-Dimer: Recent Labs    11/27/19 0823 11/28/19 0343  DDIMER 0.87* 0.80*   Hgb A1c: No results for input(s): HGBA1C in the last 72 hours. Lipid Profile: No results for input(s): CHOL, HDL, LDLCALC, TRIG, CHOLHDL, LDLDIRECT in the last 72 hours. Thyroid function studies: Recent Labs    11/26/19 0609  TSH 3.195   Anemia work up: Recent Labs    11/27/19 0823 11/28/19 0343  FERRITIN 164 164   Sepsis Labs: Recent Labs  Lab 11/26/19 0609 11/26/19 0724 11/26/19 0819 11/26/19 1000 11/26/19 1600 11/26/19 2150 11/27/19 0823 11/27/19 2115 11/28/19 0343  PROCALCITON  --   --   --   --   --   --   --   --  <0.10  WBC  --    < >  --    < >  18.3* 24.2* 18.8*  --  21.6*  LATICACIDVEN 2.5*  --  2.2*  --   --   --  2.6* 4.0*  --    < > = values in this interval not displayed.    Microbiology Recent Results (from the past 240 hour(s))  Culture, blood (routine x 2)     Status: None (Preliminary result)   Collection Time: 11/25/19  9:09 PM   Specimen: BLOOD RIGHT FOREARM  Result Value Ref Range Status   Specimen Description BLOOD RIGHT FOREARM  Final   Special Requests   Final    BOTTLES DRAWN AEROBIC AND ANAEROBIC Blood Culture results may not be optimal due to an inadequate volume of blood received in culture bottles   Culture   Final    NO GROWTH 3 DAYS Performed at [facility], [place] [address], [place], [place] [address]    Report Status PENDING  Incomplete  Culture, blood (routine x 2)     Status: None (Preliminary result)   Collection Time: 11/25/19  9:12 PM   Specimen: BLOOD RIGHT WRIST  Result Value Ref Range Status   Specimen Description BLOOD RIGHT WRIST  Final   Special Requests   Final    BOTTLES DRAWN AEROBIC AND ANAEROBIC Blood Culture results may not be optimal due to an inadequate volume of blood received in culture bottles   Culture   Final    NO GROWTH 3 DAYS Performed at [facility], [place] [address] [place], [place] [address]    Report Status PENDING  Incomplete  Respiratory Panel by RT PCR (Flu A&B, Covid) - Nasopharyngeal Swab     Status: Abnormal   Collection Time: 11/25/19 10:13 PM   Specimen: Nasopharyngeal Swab  Result Value Ref Range Status   SARS Coronavirus 2 by RT PCR POSITIVE (A) NEGATIVE Final    Comment: RESULT CALLED TO, READ BACK BY AND VERIFIED WITH: B. ORORIO,RN 2348 11/25/2019 T. TYSOR (NOTE) SARS-CoV-2 target nucleic acids are DETECTED. SARS-CoV-2 RNA is generally detectable in upper respiratory specimens  during the acute phase of infection. Positive results are indicative of the presence of the identified virus, but do not rule out bacterial infection or  co-infection with other pathogens not detected by the test. Clinical correlation with patient history and other diagnostic information is necessary to determine patient infection status. The expected result is Negative. Fact Sheet for Patients:  PinkCheek.be Fact Sheet for Healthcare Providers: GravelBags.it This test is not yet approved or cleared by the [place] FDA and  has been authorized for detection and/or diagnosis of SARS-CoV-2 by FDA under an Emergency Use Authorization (EUA).  This EUA will remain in effect (meaning this test can be used)  for the duration of  the COVID-19 declaration under Section 564(b)(1) of the Act, 21 U.S.C. section 360bbb-3(b)(1), unless the authorization is terminated or revoked sooner.    Influenza A by PCR NEGATIVE NEGATIVE Final   Influenza B by PCR NEGATIVE NEGATIVE Final    Comment: (NOTE) The Xpert Xpress SARS-CoV-2/FLU/RSV assay is intended as an aid in  the diagnosis of influenza from Nasopharyngeal swab specimens and  should not be used as a sole basis for treatment. Nasal washings and  aspirates are unacceptable for Xpert Xpress SARS-CoV-2/FLU/RSV  testing. Fact Sheet for Patients: PinkCheek.be Fact Sheet for Healthcare Providers: GravelBags.it This test is not yet approved or cleared by the [place] FDA and  has been authorized for detection and/or diagnosis of SARS-CoV-2 by  FDA under an Emergency Use Authorization (EUA). This EUA will remain  in effect (meaning this test can be used) for the duration of the  Covid-19 declaration under Section 564(b)(1) of the Act, 21  U.S.C. section 360bbb-3(b)(1), unless the authorization is  terminated or revoked. Performed at [facility], [place] [address], [place], [place] [address]   Urine culture     Status: None   Collection Time: 11/26/19  3:10 AM   Specimen:  Urine, Random  Result Value Ref Range Status   Specimen Description URINE, RANDOM  Final   Special Requests NONE  Final   Culture   Final    NO GROWTH Performed at [facility], [place] [address] [place], [place] [address]    Report Status 11/27/2019 FINAL  Final    Procedures and diagnostic studies:  MR BRAIN WO CONTRAST  Result Date: 11/26/2019 CLINICAL DATA:  Metastatic disease evaluation. CAD, ischemic cardiomyopathy EXAM: MRI HEAD WITHOUT CONTRAST TECHNIQUE: Multiplanar, multiecho pulse sequences of the brain and surrounding structures were obtained without intravenous contrast. COMPARISON:  Head CT 09/28/2018 FINDINGS: Please note the examination was terminated prior to postcontrast imaging at the patient's request. Brain: There is limited evaluation for intracranial metastatic disease on this noncontrast study. Punctate focus of restricted diffusion within the right precentral gyrus (series 5, image 43) (series 500, image 43). This may reflect a punctate acute infarct or tiny metastasis. 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 No significant cerebral white matter disease. There is mild ill-defined T2/FLAIR hyperintensity within the pons which is nonspecific, but consistent with chronic small vessel ischemic disease. Moderate generalized parenchymal atrophy. No midline shift, extra-axial fluid collection or evidence of hydrocephalus Vascular: Flow voids maintained within the proximal large arterial vessels. Skull and upper cervical spine: No focal marrow lesion Sinuses/Orbits: Visualized orbits demonstrate no acute abnormality. No significant paranasal sinus disease. Bilateral mastoid effusions (greater on the right). IMPRESSION: Please note that the examination was terminated prior to postcontrast imaging at the patient's request. This limits evaluation for intracranial metastatic disease. Punctate focus of restricted diffusion within  the right precentral gyrus, which may reflect an acute infarct or tiny metastasis. Contrast-enhanced brain MRI is recommended for further evaluation. 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 Mild chronic small vessel ischemic disease within the pons. Moderate generalized parenchymal atrophy. Electronically Signed   By: Kellie Simmering DO   On: 11/26/2019 13:34   MR TOTAL SPINE METS SCREENING  Result Date: 11/26/2019 CLINICAL DATA:  Metastatic disease evaluation for known metastasis at T7 and in lumbar spine, concerning for cord compression; metastatic disease evaluation. EXAM: MRI TOTAL SPINE WITHOUT AND WITH CONTRAST TECHNIQUE: Multisequence MR imaging of the spine without intravenous contrast. Please note the examination was prematurely terminated at the patient's request. Only localizer sequences, as well as well as a sagittal T1 weighted sequence extending from the level of the skull base to a level through the upper T8 vertebral body, were obtained. COMPARISON:  CT chest/abdomen/pelvis 11/26/2019 FINDINGS: Incompletely assessed cervical spondylosis with a C5-C6 posterior disc osteophyte complex partially effacing the ventral thecal sac and contacting the ventral spinal cord. A small posterior disc osteophyte complex is also present at C6-C7. No suspicious osseous lesion is identified within the cervical spine on the acquired sagittal T1 weighted imaging. Within the thoracic spine, there is a partially imaged mass eroding the T6 and T7 posterior elements. The caudal extent of the mass is not included in the field of view. The mass encroaches upon the dorsal aspect of the spinal canal contributing to at least moderate spinal canal stenosis. Suspected mass effect upon the spinal cord, which is incompletely assessed on the current study. T5 vertebral body hemangioma. No other suspicious osseous lesions identified at the imaged  thoracic levels. IMPRESSION: Please note the examination was prematurely  terminated at the patient's request. Scout localizer imaging was obtained. A sagittal T1 weighted sequence extending from the level of the skull base to a level through the upper T8 vertebra was also obtained. Partially imaged mass eroding the T6 and T7 posterior elements. The caudal extent of the mass is not included in the field of view. 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 There is mass effect upon the spinal cord, which is incompletely assessed on the current study. Electronically Signed   By: Kellie Simmering DO   On: 11/26/2019 14:29    Medications:    amitriptyline  75 mg Oral QHS   atorvastatin  80 mg Oral q1800   Chlorhexidine Gluconate Cloth  6 each Topical Daily   dexamethasone  4 mg Oral Q12H   [START ON 11/29/2019] pantoprazole  40 mg Intravenous Q12H   phytonadione  5 mg Subcutaneous Daily   sodium chloride flush  3 mL Intravenous Once   thiamine  100 mg Oral Daily   Continuous Infusions:  ceFEPime (MAXIPIME) IV 2 g (11/28/19 0801)   metronidazole 500 mg (11/28/19 0700)   pantoprozole (PROTONIX) infusion 8 mg/hr (11/27/19 0318)   remdesivir 100 mg in NS 100 mL 100 mg (11/27/19 0908)   vancomycin 1,500 mg (11/27/19 1717)     LOS: 3 days   Geradine Girt  [facility]   How to contact the [facility] Attending or Consulting provider [place] or covering provider during after hours [place], for this patient?  1. Check the care team in [facility] and look for a) attending/consulting TR[facility] provider listed and b) the [facility] team listed 2. Log into [email or web address] and use [place]'s universal password to access. If you do not have the password, please contact the hospital operator. 3. Locate the [facility] provider you are looking for under [facility] and page to a number that you can be directly reached. 4. If you still have difficulty reaching the provider, please page the [facility] (Director on Call) for  the Hospitalists listed on amion for assistance.  11/28/2019, 12:00 PM

## 2019-11-28 NOTE — Progress Notes (Signed)
  Patient ID: Vincent Dunlap, male   DOB: [birth year], 79 y.o.   MRN: [id number]     This NP followed up to  yesterday's GOCs meeting and to meet with [name] with established relationship acting in good faith and can reliably convey the wishes of the patient.  No reasonably available parents, adult children or siblings to be found.  Per nursing staff  patient  remains confused and disorieted, taking very little oral intake.  He is refusing medications and treatments.  I met in person with Verdene Lennert, created space and opportunity for her to share her over 10-year relationship with [name].  She originally was a caregiver in the home but when she no longer was employed for Mr. [employer]oz[employer] they remained friends.  Ms. [name] understands the multiple comorbidities and the seriousness of the patient's current medical situation. We discussed the difference between an aggressive medical intervention path and a palliative comfort path for this patient at this time in this situation.  Plan  of care -DNR/DNI -No artificial feeding now or in the future/comfort feeds as tolerated -Shift focus of care to comfort and dignity--no life prolonging measures -No further diagnostics, labs, IV medications -Symptom management   -Evaluate in 24 to 48 hours for viable transitions of care.  MOST form completed, placed in hard chart  Questions and concerns addressed   Discussed with Dr Eliseo Squires via secure chat  Total time spent on the unit was 60 minutes  The above conversation was completed outside the patient's room due to the visitor restrictions during the COVID-19 pandemic. Thorough chart review and discussion with necessary members of the care team was completed as part of assessment. All issues were his and addressed but no physical exam was performed.  Greater than 50% of the time was spent in counseling and coordination of care  Wadie Lessen NP  Palliative Medicine Team Team Phone # [phone] Pager [phone]

## 2019-11-28 NOTE — Progress Notes (Signed)
Pt refused MRSA swab.

## 2019-11-28 NOTE — Progress Notes (Signed)
Unable to hang pt's antibiotics due to patient getting irritable and attempting to rip out line when he sees me attach it. He then yells and says "There aint nothin' wrong with me now stop it"

## 2019-11-28 NOTE — Progress Notes (Signed)
Patient ID: SUJAY GRUNDMAN, male   DOB: Dec 31, 1940, 79 y.o.   MRN: [id number]   Thank you for consulting the Palliative Medicine Team at [facility] to meet your patient's and family's needs.   We have scheduled  a meeting: Tomorrow at 2:30 pm with [name], main support person and the only person with known relationship with this patient.  Will attempt to establish GOCs  Patient does not have medical decision capacity at this time  Wadie Lessen NP  No charge

## 2019-11-29 DIAGNOSIS — Z66 Do not resuscitate: Secondary | ICD-10-CM

## 2019-11-29 NOTE — Progress Notes (Signed)
Reviewed notes from palliative care and oncology. Pt's poor prognosis is noted. In reviewing with nursing and palliative, it sounds as though he is already compromised neurologically without clinical improvement from steroids. Will sign off as patient will transition to hospice care. Will sign off but please let us know if further discussion is requested.    Carola Rhine, PAC

## 2019-11-29 NOTE — Progress Notes (Signed)
          Daily Rounding Note  11/29/2019, 10:29 AM  LOS: 4 days   SUBJECTIVE:   Chief complaint: GI bleed.      Confused, disoriented, refusing medication and treatments.  Poor p.o. intake. Palliative care has seen the patient and he is now DNR/DNI.  No artificial feeding, no life prolonging measures.  No further diagnostic studies, labs, IV meds.  Continue symptom management as patient allows.  OBJECTIVE:         Vital signs in last 24 hours:    Temp:  [97.6 F (36.4 C)-97.7 F (36.5 C)] 97.6 F (36.4 C) (01/20 1600) Pulse Rate:  [117] 117 (01/20 1600) Resp:  [17-19] 17 (01/20 1600) BP: (97-105)/(55-57) 97/55 (01/20 1600) SpO2:  [100 %] 100 % (01/20 1600) Last BM Date: 11/26/19 Filed Weights   11/26/19 2102  Weight: 71.8 kg   Patient not examined.  Intake/Output from previous day: 01/20 0701 - 01/21 0700 In: 120 [P.O.:120] Out: 950 [Urine:950]  Intake/Output this shift: No intake/output data recorded.  Lab Results: Recent Labs    11/26/19 2150 11/27/19 0823 11/28/19 0343  WBC 24.2* 18.8* 21.6*  HGB 8.8* 7.1* 7.9*  HCT 26.8* 21.8* 24.4*  PLT 256 312 361   BMET Recent Labs    11/27/19 0823 11/28/19 0343  NA 142 136  K 3.6 3.6  CL 115* 106  CO2 16* 17*  GLUCOSE 97 98  BUN 32* 25*  CREATININE 0.69 0.77  CALCIUM 8.1* 8.9   LFT Recent Labs    11/27/19 0823 11/28/19 0343  PROT 4.7* 5.5*  ALBUMIN 2.1* 2.6*  AST 33 76*  ALT 12 17  ALKPHOS 86 92  BILITOT 0.8 0.8   PT/INR Recent Labs    11/26/19 1600 11/27/19 0823  LABPROT 20.4* 19.3*  INR 1.8* 1.6*   Hepatitis Panel No results for input(s): HEPBSAG, HCVAB, HEPAIGM, HEPBIGM in the last 72 hours.  Studies/Results: No results found.  ASSESMENT:   *  GIB, melena.   Left sided colitis per CT.    *    Anemia.s/p PRBCs x 2.    *   Covid 19 PNA.    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  *    Confusion,  disorientation.   PLAN   *   Comfort care. GI will sign off.    Azucena Freed  11/29/2019, 10:29 AM Phone [phone]

## 2019-11-29 NOTE — Progress Notes (Signed)
Progress Note    Vincent Dunlap  UYQ:[id number] DOB: Mar 21, 1941  DOA: 11/25/2019 PCP: Hendricks Limes, MD    Brief Narrative:     Medical records reviewed and are as summarized below:  Vincent Dunlap is an 79 y.o. male who was admitted with hypotension, anemia, and altered mental status.  He was found to be Covid positive.  His stay has been complicated by GI bleed, elevated troponin, and numerous lytic bone lesions.  Poor overall prognosis.  Family met with palliative care and we are transitioning to comfort  Assessment/Plan:   Active Problems:   Essential hypertension   CAD (coronary artery disease), native coronary artery   Chronic pain syndrome   COPD GOLD II   Sepsis (HCC)   Hypotension   Microcytic anemia   Acute metabolic encephalopathy   Acute kidney injury ([place])   Upper GI bleed   Elevated troponin   Leukocytosis   GI bleed   Compression of spinal cord (HCC)   Acute blood loss anemia   Palliative care by specialist   COVID-19 virus infection   Pain, generalized   Weakness generalized   Family met with palliative care and plan is to transition to comfort.  Palliative care is following and most likely will transition back to facility with palliative services depending on clinical course.   Upper GI bleed.   -Hemoglobin of 6.3 on presentation.   -Hemoccult positive.    -GI consulted- suspect GI bleed with not primary issues but secondary due to hypotension -s/p 2 units of PRBC for hemoglobin Current plan is for comfort focus  Essential hypertension -resume home meds as able-- was hypotensive with GI bleed -Comfort measures  Elevated troponin.  History of CAD/history of coronary artery disease.  Not a candidate for surgical intervention as per previous assessment -likely due to demand ischemia.  -Comfort measures   Chronic pain syndrome  -treat  COPDGOLD II  -continue inhalers oxygen, nebulizers.  Covid infection -Not hypoxic.   -s/p  remdesivir.    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 -Med Oncology and radiation oncology on board - Possibility of lung cancer with metastasis since patient was a smoker -palliative care consult--  transition to comfort care   Microcytic anemia -acute on chronic, but currently worse from baseline, likely secondary to GI bleed.  Acute metabolic encephalopathy -MRI of the brain with restricted diffusion in right precentral gyrus which may be acute infarct or tiny metastasis.    Dementia.   -Continue supportive care.    Family Communication/Anticipated D/C date and plan/Code Status   DVT prophylaxis: scd Code Status: dnr  Family Communication: through palliative care Disposition Plan: recommend transition to hospice and back to SNF with palliative care and comfort focused   Medical Consultants:    GI  Palliative care  Cards  oncology     Subjective:   Pleasant, no complaints  Objective:    Vitals:   11/28/19 0800 11/28/19 0937 11/28/19 1200 11/28/19 1600  BP: (!) 96/57 103/61 (!) 105/57 (!) 97/55  Pulse: 82 (!) 109 (!) 117 (!) 117  Resp: _0 Temp: 98.6 F (37 C) 98.7 F (37.1 C) 97.7 F (36.5 C) 97.6 F (36.4 C)  TempSrc: Axillary Axillary Axillary Axillary  SpO2: (!) 86% 100% 100% 100%  Weight:      Height:        Intake/Output Summary (Last 24 hours) at 11/29/2019 1202 Last  data filed at 11/28/2019 1812 Gross per 24 hour  Intake 0 ml  Output 800 ml  Net -800 ml   Filed Weights   11/26/19 2102  Weight: 71.8 kg    Exam: Laying in bed, pleasant and cooperative Regular rate and rhythm Alert  Data Reviewed:   I have personally reviewed following labs and imaging studies:  Labs: Labs show the following:   Basic Metabolic Panel: Recent Labs  Lab 11/25/19 2044 11/25/19 2044 11/26/19 0609 11/26/19 0609 11/27/19 0823 11/28/19 0343  NA 138  --   141  --  142 136  K 4.4   < > 3.9   < > 3.6 3.6  CL 108  --  111  --  115* 106  CO2 16*  --  17*  --  16* 17*  GLUCOSE 126*  --  103*  --  97 98  BUN 91*  --  76*  --  32* 25*  CREATININE 1.15  --  0.98  --  0.69 0.77  CALCIUM 9.0  --  8.4*  --  8.1* 8.9  MG  --   --  1.7  --  1.5* 1.7  PHOS  --   --  4.7*  --  2.7 2.0*   < > = values in this interval not displayed.   GFR Estimated Creatinine Clearance: 77.3 mL/min (by C-G formula based on SCr of 0.77 mg/dL). Liver Function Tests: Recent Labs  Lab 11/25/19 2044 11/26/19 0609 11/27/19 0823 11/28/19 0343  AST 18 26 33 76*  ALT _0 ALKPHOS 102 99 86 92  BILITOT 0.7 0.8 0.8 0.8  PROT 5.4* 5.0* 4.7* 5.5*  ALBUMIN 2.4* 2.3* 2.1* 2.6*   No results for input(s): LIPASE, AMYLASE in the last 168 hours. No results for input(s): AMMONIA in the last 168 hours. Coagulation profile Recent Labs  Lab 11/25/19 2133 11/26/19 1600 11/27/19 0823  INR 2.1* 1.8* 1.6*    CBC: Recent Labs  Lab 11/26/19 1000 11/26/19 1600 11/26/19 2150 11/27/19 0823 11/28/19 0343  WBC 19.3* 18.3* 24.2* 18.8* 21.6*  NEUTROABS  --   --   --  15.3* 18.3*  HGB 8.2* 8.3* 8.8* 7.1* 7.9*  HCT 25.4* 25.7* 26.8* 21.8* 24.4*  MCV 89.4 88.3 88.4 89.3 90.7  PLT 320 317 256 312 361   Cardiac Enzymes: No results for input(s): CKTOTAL, CKMB, CKMBINDEX, TROPONINI in the last 168 hours. BNP (last 3 results) No results for input(s): PROBNP in the last 8760 hours. CBG: Recent Labs  Lab 11/25/19 2048  GLUCAP 116*   D-Dimer: Recent Labs    11/27/19 0823 11/28/19 0343  DDIMER 0.87* 0.80*   Hgb A1c: No results for input(s): HGBA1C in the last 72 hours. Lipid Profile: No results for input(s): CHOL, HDL, LDLCALC, TRIG, CHOLHDL, LDLDIRECT in the last 72 hours. Thyroid function studies: No results for input(s): TSH, T4TOTAL, T3FREE, THYROIDAB in the last 72 hours.  Invalid input(s): FREET3 Anemia work up: Recent Labs    11/27/19 0823  11/28/19 0343  FERRITIN 164 164   Sepsis Labs: Recent Labs  Lab 11/26/19 0609 11/26/19 0724 11/26/19 0819 11/26/19 1000 11/26/19 1600 11/26/19 2150 11/27/19 0823 11/27/19 2115 11/28/19 0343  PROCALCITON  --   --   --   --   --   --   --   --  <0.10  WBC  --    < >  --    < > 18.3* 24.2* 18.8*  --  21.6*  LATICACIDVEN 2.5*  --  2.2*  --   --   --  2.6* 4.0*  --    < > = values in this interval not displayed.    Microbiology Recent Results (from the past 240 hour(s))  Culture, blood (routine x 2)     Status: None (Preliminary result)   Collection Time: 11/25/19  9:09 PM   Specimen: BLOOD RIGHT FOREARM  Result Value Ref Range Status   Specimen Description BLOOD RIGHT FOREARM  Final   Special Requests   Final    BOTTLES DRAWN AEROBIC AND ANAEROBIC Blood Culture results may not be optimal due to an inadequate volume of blood received in culture bottles   Culture   Final    NO GROWTH 4 DAYS Performed at [facility], [place] [address] [place], [place] [address]    Report Status PENDING  Incomplete  Culture, blood (routine x 2)     Status: None (Preliminary result)   Collection Time: 11/25/19  9:12 PM   Specimen: BLOOD RIGHT WRIST  Result Value Ref Range Status   Specimen Description BLOOD RIGHT WRIST  Final   Special Requests   Final    BOTTLES DRAWN AEROBIC AND ANAEROBIC Blood Culture results may not be optimal due to an inadequate volume of blood received in culture bottles   Culture   Final    NO GROWTH 4 DAYS Performed at [facility], [place] [address], [place], [place] [address]    Report Status PENDING  Incomplete  Respiratory Panel by RT PCR (Flu A&B, Covid) - Nasopharyngeal Swab     Status: Abnormal   Collection Time: 11/25/19 10:13 PM   Specimen: Nasopharyngeal Swab  Result Value Ref Range Status   SARS Coronavirus 2 by RT PCR POSITIVE (A) NEGATIVE Final    Comment: RESULT CALLED TO, READ BACK BY AND VERIFIED WITH: B. ORORIO,RN 2348 11/25/2019 T.  TYSOR (NOTE) SARS-CoV-2 target nucleic acids are DETECTED. SARS-CoV-2 RNA is generally detectable in upper respiratory specimens  during the acute phase of infection. Positive results are indicative of the presence of the identified virus, but do not rule out bacterial infection or co-infection with other pathogens not detected by the test. Clinical correlation with patient history and other diagnostic information is necessary to determine patient infection status. The expected result is Negative. Fact Sheet for Patients:  PinkCheek.be Fact Sheet for Healthcare Providers: GravelBags.it This test is not yet approved or cleared by the [place] FDA and  has been authorized for detection and/or diagnosis of SARS-CoV-2 by FDA under an Emergency Use Authorization (EUA).  This EUA will remain in effect (meaning this test can be used)  for the duration of  the COVID-19 declaration under Section 564(b)(1) of the Act, 21 U.S.C. section 360bbb-3(b)(1), unless the authorization is terminated or revoked sooner.    Influenza A by PCR NEGATIVE NEGATIVE Final   Influenza B by PCR NEGATIVE NEGATIVE Final    Comment: (NOTE) The Xpert Xpress SARS-CoV-2/FLU/RSV assay is intended as an aid in  the diagnosis of influenza from Nasopharyngeal swab specimens and  should not be used as a sole basis for treatment. Nasal washings and  aspirates are unacceptable for Xpert Xpress SARS-CoV-2/FLU/RSV  testing. Fact Sheet for Patients: PinkCheek.be Fact Sheet for Healthcare Providers: GravelBags.it This test is not yet approved or cleared by the [place] FDA and  has been authorized for detection and/or diagnosis of SARS-CoV-2 by  FDA under an Emergency Use Authorization (  EUA). This EUA will remain  in effect (meaning this test can be used) for the duration of the  Covid-19 declaration  under Section 564(b)(1) of the Act, 21  U.S.C. section 360bbb-3(b)(1), unless the authorization is  terminated or revoked. Performed at [facility], [place] [address], [place], [place] [address]   Urine culture     Status: None   Collection Time: 11/26/19  3:10 AM   Specimen: Urine, Random  Result Value Ref Range Status   Specimen Description URINE, RANDOM  Final   Special Requests NONE  Final   Culture   Final    NO GROWTH Performed at [facility], [place] [address] [place], [place] [address]    Report Status 11/27/2019 FINAL  Final    Procedures and diagnostic studies:  No results found.  Medications:   . amitriptyline  75 mg Oral QHS  . Chlorhexidine Gluconate Cloth  6 each Topical Daily  . sodium chloride flush  3 mL Intravenous Once   Continuous Infusions:    LOS: 4 days   [facility]   How to contact the [facility] Attending or Consulting provider [place] or covering provider during after hours [place], for this patient?  1. Check the care team in [facility] and look for a) attending/consulting [facility] provider listed and b) the [facility] team listed 2. Log into [email or web address] and use [place]'s universal password to access. If you do not have the password, please contact the hospital operator. 3. Locate the [facility] provider you are looking for under [facility] and page to a number that you can be directly reached. 4. If you still have difficulty reaching the provider, please page the [facility] (Director on Call) for the Hospitalists listed on amion for assistance.  11/29/2019, 12:02 PM

## 2019-11-29 NOTE — Progress Notes (Signed)
His chart is reviewed, patient not seen Myeloma panel is negative, abnormal bone lesions likely metastatic in nature Overall poor prognosis and multiple co-morbidities Agree with palliative care to transition him to comfort measures Will sign off, please call if questions arise

## 2019-11-29 NOTE — Progress Notes (Signed)
Patient ID: Vincent Dunlap, male   DOB: 1941-04-03, 79 y.o.   MRN: [id number]  This NP  followed up to  yesterday's [place].  As discussed with Vincent Dunlap yesterday focus of care is comfort and dignity, allowing for a natural death.  He continues to be awake but confused.  He is taking minimal po intake.  Clearly he is hospice appropriate.  Discussed with LCSW recommendation for return to SNF with hospice services  MOST form completed and in hard chart.  Discussed with  Vincent Dunlap, notified Dr Eliseo Squires via secure chat  Total time spent on the unit was 25 minutes  Greater than 50% of the time was spent in counseling and coordination of care  Wadie Lessen NP  Palliative Medicine Team Team Phone # [phone] Pager [phone]

## 2019-11-30 LAB — CULTURE, BLOOD (ROUTINE X 2)
Culture: NO GROWTH
Culture: NO GROWTH

## 2019-11-30 MED ORDER — LORAZEPAM 1 MG PO TABS: 1.0000 mg | ORAL_TABLET | ORAL | 0 refills | Status: AC | PRN

## 2019-11-30 MED ORDER — MORPHINE SULFATE (CONCENTRATE) 10 MG/0.5ML PO SOLN: 5.0000 mg | ORAL | 0 refills | Status: AC | PRN

## 2019-11-30 NOTE — Discharge Summary (Signed)
Physician Discharge Summary  Vincent Dunlap PIR:[id number] DOB: September 15, 1941 DOA: 11/25/2019  PCP: Hendricks Limes, MD  Admit date: 11/25/2019 Discharge date: 11/30/2019  Admitted From: SNF Disposition: SNF [facility] with hospice following.  Recommendations for Outpatient Follow-up:  1. Follow up with PCP in 1-2 weeks 2. Please obtain BMP/CBC in one week 3. Patient has been discharged to skilled nursing facility with hospice following.  Home Health: Yes Equipment/Devices:  None.  Discharge Condition: Fair CODE STATUS:DNR Diet recommendation: Heart Healthy / Carb Modified / Regular / Dysphagia   Brief hospital course: Vincent Dunlap is an 79 y.o. male who was admitted with hypotension, anemia, and altered mental status. He was found to be Covid positive.  His stay has been complicated by GI bleed, elevated troponin, and numerous lytic bone lesions.  Poor overall prognosis.  Family met with palliative care and we are transitioning to comfort care. Patient is being transferred to [facility] with hospice following.  He was managed for below problems in the hospital.  Discharge Diagnoses:  Active Problems:   Essential hypertension   CAD (coronary artery disease), native coronary artery   Chronic pain syndrome   COPD GOLD II   Sepsis (HCC)   Hypotension   Microcytic anemia   Acute metabolic encephalopathy   Acute kidney injury ([place])   Upper GI bleed   Elevated troponin   Leukocytosis   GI bleed   Compression of spinal cord (HCC)   Acute blood loss anemia   Palliative care by specialist   COVID-19 virus infection   Pain, generalized   Weakness generalized   DNR (do not resuscitate) Family met with palliative care and plan is to transition to comfort.  Palliative care is following and most likely will transition back to facility with palliative services depending on clinical course.   # Upper GI bleed. -Hemoglobin of 6.3 on presentation. -Hemoccult positive.   -GIconsulted- suspect GI bleed with not primary issues but secondary due to hypotension -s/p 2 units of PRBCfor hemoglobin Current plan is for comfort focus  # Essential hypertension -resume home meds as able-- was hypotensive with GI bleed -Comfort measures  # Elevated troponin. History of CAD/history of coronary artery disease.Not a candidate for surgical intervention as per previous assessment -likely due to demand ischemia.  -Comfort measures  #Chronic pain syndrome  - adequate pain control.  # COPDGOLD II  -continue inhalers oxygen, nebulizers.  #Covid infection -Nothypoxic.  -s/p remdesivir.   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 -MedOncologyand radiation oncology on board - Possibility of lung cancer with metastasis since patient was a smoker -palliative care consult--  transition to comfort care  #Microcytic anemia -acute onchronic,but currently worse from baseline,likely secondary to GI bleed.  #Acute metabolic encephalopathy -MRI of the brainwith restricted diffusion in right precentral gyrus which may be acute infarct or tiny metastasis.  #Dementia.  -Continue supportive care.  Discharge Instructions Patient is being transferred to [facility] for comfort care.  Hospice following  Allergies as of 11/30/2019      Reactions   Chantix [varenicline] Other (See Comments)   Pt states it made him "crazy"      Medication List    STOP taking these medications   atorvastatin 80 MG tablet Commonly known as: LIPITOR   Dialyvite Vitamin D3 Max 1.25 MG (50000 UT) Tabs Generic drug: Cholecalciferol   loratadine 10 MG tablet Commonly known as: CLARITIN   senna-docusate 8.6-50 MG tablet  Commonly known as: Senokot-S   thiamine 100 MG tablet     TAKE these medications   acetaminophen 500 MG tablet Commonly known as: TYLENOL Take 500 mg by mouth every  4 (four) hours as needed for mild pain or moderate pain.   amitriptyline 75 MG tablet Commonly known as: ELAVIL Take 75 mg by mouth at bedtime.   aspirin 81 MG chewable tablet Chew 1 tablet (81 mg total) by mouth daily.   bisacodyl 10 MG suppository Commonly known as: DULCOLAX Place 1 suppository (10 mg total) rectally daily as needed for severe constipation.   carvedilol 6.25 MG tablet Commonly known as: COREG Take 1 tablet (6.25 mg total) by mouth 2 (two) times daily with a meal.   clopidogrel 75 MG tablet Commonly known as: PLAVIX Take 1 tablet (75 mg total) by mouth daily with breakfast.   desvenlafaxine 50 MG 24 hr tablet Commonly known as: PRISTIQ Take 50 mg by mouth daily.   furosemide 20 MG tablet Commonly known as: LASIX Take 1 tablet (20 mg total) by mouth daily as needed for fluid or edema (weight gain 3 lbs in 24 hours or 5 lbs in 7 days.).   gabapentin 100 MG capsule Commonly known as: NEURONTIN Take 400 mg by mouth 3 (three) times daily.   ipratropium-albuterol 0.5-2.5 (3) MG/3ML Soln Commonly known as: DUONEB Take 3 mLs by nebulization 4 (four) times daily as needed (SOB).   isosorbide mononitrate 30 MG 24 hr tablet Commonly known as: IMDUR Take 1 tablet (30 mg total) by mouth daily.   LORazepam 1 MG tablet Commonly known as: ATIVAN Take 1 tablet (1 mg total) by mouth every 4 (four) hours as needed for anxiety or sleep.   morphine CONCENTRATE 10 MG/0.5ML Soln concentrated solution Take 0.25 mLs (5 mg total) by mouth every hour as needed for moderate pain or shortness of breath.   nitroGLYCERIN 0.4 MG SL tablet Commonly known as: NITROSTAT Place 1 tablet (0.4 mg total) under the tongue every 5 (five) minutes as needed for chest pain.      Follow-up Information    Hendricks Limes, MD Follow up in 1 week(s).   Specialty: Internal Medicine Contact information: [place] [address] [phone]        Thompson Grayer,  MD .   Specialty: Cardiology Contact information: [address] [place] [address] [phone]        Nahser, Wonda Cheng, MD .   Specialty: Cardiology Contact information: [address] [place] [address] [phone]          Allergies  Allergen Reactions  . Chantix [Varenicline] Other (See Comments)    Pt states it made him "crazy"    Consultations:  GI  Palliative care  Cardiology  Neurology   Procedures/Studies: CT ANGIO CHEST PE W OR WO CONTRAST  Result Date: 11/26/2019 CLINICAL DATA:  Dyspnea, chronic, unclear etiology COVID positive. EXAM: CT ANGIOGRAPHY CHEST WITH CONTRAST TECHNIQUE: Multidetector CT imaging of the chest was performed using the standard protocol during bolus administration of intravenous contrast. Multiplanar CT image reconstructions and MIPs were obtained to evaluate the vascular anatomy. CONTRAST:  196m OMNIPAQUE IOHEXOL 350 MG/ML SOLN COMPARISON:  Chest radiograph yesterday. Chest CT 10/18/2017 FINDINGS: Cardiovascular: There are no filling defects within the pulmonary arteries to suggest pulmonary embolus. Subsegmental branches are not well assessed. Aortic atherosclerosis. Cannot assess for dissection given phase of contrast bolus timing tailored to pulmonary artery evaluation.  Coronary artery calcifications versus stents. Heart is normal in size. No pericardial effusion. Mediastinum/Nodes: Small mediastinal lymph nodes, all subcentimeter short axis. Largest node is prevascular measuring 9 mm. No hilar adenopathy. No esophageal wall thickening. No visualized thyroid nodule. 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 Minimal dependent atelectasis in the left lower lobe. Calcified granuloma in the right middle lobe. No noncalcified nodules. No pulmonary mass. No focal airspace disease. No ground-glass opacity typical of COVID pneumonia. No pleural fluid. Upper Abdomen: Assessed on abdominal CT yesterday.  Musculoskeletal: Expansile lytic lesion involving the right posterolateral seventh rib with bony destruction. Lytic lesion involving posterior elements of T7 extend into and may obliterate spinal canal. Lesion extends to involve the inferior aspect of T6 spinous process. L1 vertebral body lesion was assessed on abdominal CT yesterday. Review of the MIP images confirms the above findings. IMPRESSION: 1. No pulmonary embolus. 2. 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 No evidence of acute pneumonia or primary bronchogenic malignancy. 3. Expansile lytic lesions involving T7 vertebral body which likely extends to and likely obliterates the spinal canal. Destructive lytic lesion involving right posterolateral seventh rib. In conjunction with bone lesions in the lumbar spine and pelvis, findings may represent metastatic disease or multiple myeloma. 4. Aortic atherosclerosis. Coronary artery calcifications versus stents. Aortic Atherosclerosis (ICD10-I70.0) and Emphysema (ICD10-J43.9). Electronically Signed   By: Keith Rake M.D.   On: 11/26/2019 03:59   MR BRAIN WO CONTRAST  Result Date: 11/26/2019 CLINICAL DATA:  Metastatic disease evaluation. CAD, ischemic cardiomyopathy EXAM: MRI HEAD WITHOUT CONTRAST TECHNIQUE: Multiplanar, multiecho pulse sequences of the brain and surrounding structures were obtained without intravenous contrast. COMPARISON:  Head CT 09/28/2018 FINDINGS: Please note the examination was terminated prior to postcontrast imaging at the patient's request. Brain: There is limited evaluation for intracranial metastatic disease on this noncontrast study. Punctate focus of restricted diffusion within the right precentral gyrus (series 5, image 43) (series 500, image 43). This may reflect a punctate acute infarct or tiny metastasis. 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 No significant cerebral white matter disease. There is mild ill-defined T2/FLAIR  hyperintensity within the pons which is nonspecific, but consistent with chronic small vessel ischemic disease. Moderate generalized parenchymal atrophy. No midline shift, extra-axial fluid collection or evidence of hydrocephalus Vascular: Flow voids maintained within the proximal large arterial vessels. Skull and upper cervical spine: No focal marrow lesion Sinuses/Orbits: Visualized orbits demonstrate no acute abnormality. No significant paranasal sinus disease. Bilateral mastoid effusions (greater on the right). IMPRESSION: Please note that the examination was terminated prior to postcontrast imaging at the patient's request. This limits evaluation for intracranial metastatic disease. Punctate focus of restricted diffusion within the right precentral gyrus, which may reflect an acute infarct or tiny metastasis. Contrast-enhanced brain MRI is recommended for further evaluation. 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 Mild chronic small vessel ischemic disease within the pons. Moderate generalized parenchymal atrophy. Electronically Signed   By: Kellie Simmering DO   On: 11/26/2019 13:34   CT Abdomen Pelvis W Contrast  Result Date: 11/26/2019 CLINICAL DATA:  GI bleed. EXAM: CT ABDOMEN AND PELVIS WITH CONTRAST TECHNIQUE: Multidetector CT imaging of the abdomen and pelvis was performed using the standard protocol following bolus administration of intravenous contrast. CONTRAST:  123m OMNIPAQUE IOHEXOL 300 MG/ML  SOLN COMPARISON:  Pelvis CT 09/28/2018. No prior abdominal imaging. FINDINGS: Lower chest: Breathing motion artifact. No pleural fluid. Heart is normal in size. There are coronary artery calcifications. Hepatobiliary: Small subcentimeter low-density  in the central liver is too small to characterize but likely small cyst or hemangioma. Elongated gallbladder. No pericholecystic inflammation. No calcified gallstone. No biliary dilatation. Pancreas: Parenchymal atrophy. No ductal dilatation or  inflammation. No evidence pancreatic mass. Spleen: Normal in size without focal abnormality. Trace perisplenic fluid. Adrenals/Urinary Tract: No adrenal nodule. Cortical scarring in the upper left kidney. No hydronephrosis. Low-density lesions in the left kidney are too small to characterize but likely cysts. No evidence of solid renal mass. Absent excretion on delayed phase imaging. Urinary bladder is distended. Mild bladder wall thickening at the base which appears similar to prior exam. The bladder appears slightly trabeculated. Stomach/Bowel: Motion and lack of enteric contrast limits gastric evaluation. There is equivocal gastric wall thickening about the proximal stomach. Small bowel is decompressed. No bowel obstruction or small bowel inflammation. Sigmoid colonic wall thickening with pericolonic edema, moderate length segment. Colonic diverticulosis without evidence of focal diverticulitis. No obvious colonic mass. The appendix is not confidently visualized. Vascular/Lymphatic: Severe aortic atherosclerosis. Advanced branch and peripheral atherosclerosis. Dense calcification at the origin of the celiac artery. Proximal mesenteric vessels are patent. Portal vein is patent. No enlarged lymph nodes in the abdomen or pelvis. Reproductive: Prostate gland not enlarged. Other: Trace perisplenic free fluid. No free air. Musculoskeletal: Multiple lytic lesions involving the lumbar spine and bony pelvis. L1 lesion involves the vertebral body, right pedicle and lamina with extraosseous extension, there is mass effect and invasion of the spinal canal. Pathologic fracture with infiltrated lesion of L3 vertebral body with soft tissue extension peripherally into the bilateral psoas muscles. Posterior extension into the spinal canal. Expansile lucent lesion involving the right anterior iliac bone, this is at site of previous cortical irregularity. Permeative expansile lesion involving the left posterior acetabulum and  ischium. Lytic lesion involving the left pubic body. IMPRESSION: 1. Sigmoid colonic wall thickening with pericolonic edema, suspicious for colitis. This may be infectious or inflammatory, and likely cause of patient's GI bleed. Neoplastic involvement is felt less likely given length of involvement but not entirely excluded. 2. Multiple lytic lesions involving the lumbar spine and bony pelvis. Findings may represent metastatic disease or multiple myeloma. There is extraosseous extension at L1 and L3 with mass effect and invasion of the spinal canal. Expansile lytic lesions in the right iliac bone is at site of previous cortical irregularity. Permeative lytic lesion involving the posterior left acetabulum and left pubic body. Given history of emphysema, consider further evaluation with chest CT to evaluate for pulmonary mass (preferably with IV contrast to assess for hilar regions). 3. Distended urinary bladder with mild bladder wall thickening at the base and suggestion of trabeculation, similar to prior exam. 4. Equivocal gastric wall thickening about the proximal stomach. 5. Advanced aortic and branch atherosclerosis. Severe peripheral vascular disease. Aortic Atherosclerosis (ICD10-I70.0). Electronically Signed   By: Keith Rake M.D.   On: 11/26/2019 00:57   DG Chest Port 1 View  Result Date: 11/25/2019 CLINICAL DATA:  Weakness EXAM: PORTABLE CHEST 1 VIEW COMPARISON:  Radiograph 02/10/2018, CT 10/18/2017 FINDINGS: Background of diffusely coarsened interstitial changes in the lungs which may be accentuated by low lung volumes. No focal consolidative opacity. No convincing features of edema. The aorta is calcified. The remaining cardiomediastinal contours are unremarkable. No acute osseous or soft tissue abnormality. Degenerative changes are present in the imaged spine and shoulders. The osseous structures appear diffusely demineralized which may limit detection of small or nondisplaced fractures.  IMPRESSION: Background of chronic interstitial changes and emphysema likely accentuated  by low lung volumes. No definite acute cardiopulmonary abnormality. Aortic Atherosclerosis (ICD10-I70.0). Electronically Signed   By: Lovena Le M.D.   On: 11/25/2019 21:31   MR TOTAL SPINE METS SCREENING  Result Date: 11/26/2019 CLINICAL DATA:  Metastatic disease evaluation for known metastasis at T7 and in lumbar spine, concerning for cord compression; metastatic disease evaluation. EXAM: MRI TOTAL SPINE WITHOUT AND WITH CONTRAST TECHNIQUE: Multisequence MR imaging of the spine without intravenous contrast. Please note the examination was prematurely terminated at the patient's request. Only localizer sequences, as well as well as a sagittal T1 weighted sequence extending from the level of the skull base to a level through the upper T8 vertebral body, were obtained. COMPARISON:  CT chest/abdomen/pelvis 11/26/2019 FINDINGS: Incompletely assessed cervical spondylosis with a C5-C6 posterior disc osteophyte complex partially effacing the ventral thecal sac and contacting the ventral spinal cord. A small posterior disc osteophyte complex is also present at C6-C7. No suspicious osseous lesion is identified within the cervical spine on the acquired sagittal T1 weighted imaging. Within the thoracic spine, there is a partially imaged mass eroding the T6 and T7 posterior elements. The caudal extent of the mass is not included in the field of view. The mass encroaches upon the dorsal aspect of the spinal canal contributing to at least moderate spinal canal stenosis. Suspected mass effect upon the spinal cord, which is incompletely assessed on the current study. T5 vertebral body hemangioma. No other suspicious osseous lesions identified at the imaged thoracic levels. IMPRESSION: Please note the examination was prematurely terminated at the patient's request. Scout localizer imaging was obtained. A sagittal T1 weighted sequence  extending from the level of the skull base to a level through the upper T8 vertebra was also obtained. Partially imaged mass eroding the T6 and T7 posterior elements. The caudal extent of the mass is not included in the field of view. 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 There is mass effect upon the spinal cord, which is incompletely assessed on the current study. Electronically Signed   By: Kellie Simmering DO   On: 11/26/2019 14:29   ECHOCARDIOGRAM LIMITED  Result Date: 11/26/2019   ECHOCARDIOGRAM REPORT   Patient Name:   KENSON GROH [facility] Date of Exam: 11/26/2019 Medical Rec #:  [id number]    Height:       71.5 in Accession #:    [id number]   Weight:       172.0 lb Date of Birth:  04-04-1941    BSA:          1.99 m Patient Age:    37 years     BP:           105/58 mmHg Patient Gender: M            HR:           94 bpm. Exam Location:  Inpatient Procedure: Limited Echo Indications:    Elevated Troponin  History:        Patient has prior history of Echocardiogram examinations, most                 recent 09/29/2018. CHF and Cardiomyopathy, CAD, COPD and PVD,                 Signs/Symptoms:Altered Mental Status; Risk Factors:Hypertension                 and Current Smoker. Cvoid positive.  Sonographer:    Dustin Flock Referring Phys: [phone] ANASTASSIA DOUTOVA  Sonographer Comments: Suboptimal parasternal window, suboptimal apical window and suboptimal subcostal window. Image acquisition challenging due to COPD. IMPRESSIONS  1. No images of the heart available from the study except can see that the IVC is not dilated. No windows available at all for echo. Consider TEE or cardiac MRI. FINDINGS  Left Ventricle: Left ventricular ejection fraction, by visual estimation, is Unable to comment%. The left ventricle has Unable to comment function. The left ventricle is not well visualized. There is no left ventricular hypertrophy. No images of  the heart available from the study except can see that the IVC is not dilated. Right Ventricle: The right ventricular size is not well visualized. Right vetricular wall thickness was not assessed. Global RV systolic function is was not well visualized. Left Atrium: Left atrial size was not well visualized. Right Atrium: Right atrial size was not well visualized Pericardium: There is no evidence of pericardial effusion. Mitral Valve: The mitral valve was not well visualized. Unable to comment mitral valve regurgitation. Tricuspid Valve: The tricuspid valve is not well visualized. Tricuspid valve regurgitation Unable to comment. Aortic Valve: The aortic valve was not well visualized. Aortic valve regurgitation Unable to comment. Pulmonic Valve: The pulmonic valve was not well visualized. Pulmonic valve regurgitation is not visualized. Pulmonic regurgitation is not visualized. Aorta: The aortic root was not well visualized. Venous: The inferior vena cava is normal in size with greater than 50% respiratory variability, suggesting right atrial pressure of 3 mmHg. IAS/Shunts: The interatrial septum was not well visualized.  Loralie Champagne MD Electronically signed by Loralie Champagne MD Signature Date/Time: 11/26/2019/1:46:27 PM    Final        Subjective: Patient was seen lying in the bed,  He was lying comfortably,  appears more pleasant,  smiles while talking.  Alert awake oriented X 2  Discharge Exam: Vitals:   11/29/19 1933 11/30/19 0841  BP: 105/78 98/63  Pulse: (!) 118 (!) 112  Resp:    Temp: (!) 97.4 F (36.3 C) 97.8 F (36.6 C)  SpO2: (!) 87% 99%   Vitals:   11/28/19 1200 11/28/19 1600 11/29/19 1933 11/30/19 0841  BP: (!) 105/57 (!) 97/55 105/78 98/63  Pulse: (!) 117 (!) 117 (!) 118 (!) 112  Resp: 19 17    Temp: 97.7 F (36.5 C) 97.6 F (36.4 C) (!) 97.4 F (36.3 C) 97.8 F (36.6 C)  TempSrc: Axillary Axillary  Oral  SpO2: 100% 100% (!) 87% 99%  Weight:      Height:        General:  Pt is alert, awake, not in acute distress Cardiovascular: RRR, S1/S2 +, no rubs, no gallops Respiratory: CTA bilaterally, no wheezing, no rhonchi Abdominal: Soft, NT, ND, bowel sounds + Extremities: no edema, no cyanosis    The results of significant diagnostics from this hospitalization (including imaging, microbiology, ancillary and laboratory) are listed below for reference.     Microbiology: Recent Results (from the past 240 hour(s))  Culture, blood (routine x 2)     Status: None   Collection Time: 11/25/19  9:09 PM   Specimen: BLOOD RIGHT FOREARM  Result Value Ref Range Status   Specimen Description BLOOD RIGHT FOREARM  Final   Special Requests   Final    BOTTLES DRAWN AEROBIC AND ANAEROBIC Blood Culture results may not be optimal due to an inadequate volume of blood received in culture bottles   Culture   Final  NO GROWTH 5 DAYS Performed at [facility], [place] [address] [place], [place] [address]    Report Status 11/30/2019 FINAL  Final  Culture, blood (routine x 2)     Status: None   Collection Time: 11/25/19  9:12 PM   Specimen: BLOOD RIGHT WRIST  Result Value Ref Range Status   Specimen Description BLOOD RIGHT WRIST  Final   Special Requests   Final    BOTTLES DRAWN AEROBIC AND ANAEROBIC Blood Culture results may not be optimal due to an inadequate volume of blood received in culture bottles   Culture   Final    NO GROWTH 5 DAYS Performed at [facility], [place] [address] [place], [place] [address]    Report Status 11/30/2019 FINAL  Final  Respiratory Panel by RT PCR (Flu A&B, Covid) - Nasopharyngeal Swab     Status: Abnormal   Collection Time: 11/25/19 10:13 PM   Specimen: Nasopharyngeal Swab  Result Value Ref Range Status   SARS Coronavirus 2 by RT PCR POSITIVE (A) NEGATIVE Final    Comment: RESULT CALLED TO, READ BACK BY AND VERIFIED WITH: B. ORORIO,RN [id number] 11/25/2019 T. TYSOR (NOTE) SARS-CoV-2 target nucleic acids are DETECTED. SARS-CoV-2 RNA  is generally detectable in upper respiratory specimens  during the acute phase of infection. Positive results are indicative of the presence of the identified virus, but do not rule out bacterial infection or co-infection with other pathogens not detected by the test. Clinical correlation with patient history and other diagnostic information is necessary to determine patient infection status. The expected result is Negative. Fact Sheet for Patients:  PinkCheek.be Fact Sheet for Healthcare Providers: GravelBags.it This test is not yet approved or cleared by the [place] FDA and  has been authorized for detection and/or diagnosis of SARS-CoV-2 by FDA under an Emergency Use Authorization (EUA).  This EUA will remain in effect (meaning this test can be used)  for the duration of  the COVID-19 declaration under Section 564(b)(1) of the Act, 21 U.S.C. section 360bbb-3(b)(1), unless the authorization is terminated or revoked sooner.    Influenza A by PCR NEGATIVE NEGATIVE Final   Influenza B by PCR NEGATIVE NEGATIVE Final    Comment: (NOTE) The Xpert Xpress SARS-CoV-2/FLU/RSV assay is intended as an aid in  the diagnosis of influenza from Nasopharyngeal swab specimens and  should not be used as a sole basis for treatment. Nasal washings and  aspirates are unacceptable for Xpert Xpress SARS-CoV-2/FLU/RSV  testing. Fact Sheet for Patients: PinkCheek.be Fact Sheet for Healthcare Providers: GravelBags.it This test is not yet approved or cleared by the [place] FDA and  has been authorized for detection and/or diagnosis of SARS-CoV-2 by  FDA under an Emergency Use Authorization (EUA). This EUA will remain  in effect (meaning this test can be used) for the duration of the  Covid-19 declaration under Section 564(b)(1) of the Act, 21  U.S.C. section 360bbb-3(b)(1), unless  the authorization is  terminated or revoked. Performed at [facility], [place] [address] [place], [place] [address]   Urine culture     Status: None   Collection Time: 11/26/19  3:10 AM   Specimen: Urine, Random  Result Value Ref Range Status   Specimen Description URINE, RANDOM  Final   Special Requests NONE  Final   Culture   Final    NO GROWTH Performed at [facility], [place] [address], [place],  [address]    Report Status 11/27/2019  FINAL  Final     Labs: BNP (last 3 results) No results for input(s): BNP in the last 8760 hours. Basic Metabolic Panel: Recent Labs  Lab 11/25/19 2044 11/26/19 0609 11/27/19 0823 11/28/19 0343  NA 138 141 142 136  K 4.4 3.9 3.6 3.6  CL 108 111 115* 106  CO2 16* 17* 16* 17*  GLUCOSE 126* 103* 97 98  BUN 91* 76* 32* 25*  CREATININE 1.15 0.98 0.69 0.77  CALCIUM 9.0 8.4* 8.1* 8.9  MG  --  1.7 1.5* 1.7  PHOS  --  4.7* 2.7 2.0*   Liver Function Tests: Recent Labs  Lab 11/25/19 2044 11/26/19 0609 11/27/19 0823 11/28/19 0343  AST 18 26 33 76*  ALT 10 10 12 17   ALKPHOS 102 99 86 92  BILITOT 0.7 0.8 0.8 0.8  PROT 5.4* 5.0* 4.7* 5.5*  ALBUMIN 2.4* 2.3* 2.1* 2.6*   No results for input(s): LIPASE, AMYLASE in the last 168 hours. No results for input(s): AMMONIA in the last 168 hours. CBC: Recent Labs  Lab 11/26/19 1000 11/26/19 1600 11/26/19 2150 11/27/19 0823 11/28/19 0343  WBC 19.3* 18.3* 24.2* 18.8* 21.6*  NEUTROABS  --   --   --  15.3* 18.3*  HGB 8.2* 8.3* 8.8* 7.1* 7.9*  HCT 25.4* 25.7* 26.8* 21.8* 24.4*  MCV 89.4 88.3 88.4 89.3 90.7  PLT 320 317 256 312 361   Cardiac Enzymes: No results for input(s): CKTOTAL, CKMB, CKMBINDEX, TROPONINI in the last 168 hours. BNP: Invalid input(s): POCBNP CBG: Recent Labs  Lab 11/25/19 2048  GLUCAP 116*   D-Dimer Recent Labs    11/28/19 0343  DDIMER 0.80*   Hgb A1c No results for input(s): HGBA1C in the last 72 hours. Lipid Profile No results for  input(s): CHOL, HDL, LDLCALC, TRIG, CHOLHDL, LDLDIRECT in the last 72 hours. Thyroid function studies No results for input(s): TSH, T4TOTAL, T3FREE, THYROIDAB in the last 72 hours.  Invalid input(s): FREET3 Anemia work up Recent Labs    11/28/19 0343  FERRITIN 164   Urinalysis    Component Value Date/Time   COLORURINE YELLOW 11/26/2019 0310   APPEARANCEUR CLEAR 11/26/2019 0310   LABSPEC 1.032 (H) 11/26/2019 0310   PHURINE 5.0 11/26/2019 0310   GLUCOSEU NEGATIVE 11/26/2019 0310   HGBUR NEGATIVE 11/26/2019 0310   BILIRUBINUR NEGATIVE 11/26/2019 0310   KETONESUR NEGATIVE 11/26/2019 0310   PROTEINUR NEGATIVE 11/26/2019 0310   UROBILINOGEN 1.0 07/29/2009 0002   NITRITE NEGATIVE 11/26/2019 0310   LEUKOCYTESUR NEGATIVE 11/26/2019 0310   Sepsis Labs Invalid input(s): PROCALCITONIN,  WBC,  LACTICIDVEN Microbiology Recent Results (from the past 240 hour(s))  Culture, blood (routine x 2)     Status: None   Collection Time: 11/25/19  9:09 PM   Specimen: BLOOD RIGHT FOREARM  Result Value Ref Range Status   Specimen Description BLOOD RIGHT FOREARM  Final   Special Requests   Final    BOTTLES DRAWN AEROBIC AND ANAEROBIC Blood Culture results may not be optimal due to an inadequate volume of blood received in culture bottles   Culture   Final    NO GROWTH 5 DAYS Performed at [facility], [place] [address], [place], [place] [address]    Report Status 11/30/2019 FINAL  Final  Culture, blood (routine x 2)     Status: None   Collection Time: 11/25/19  9:12 PM   Specimen: BLOOD RIGHT WRIST  Result Value Ref Range Status   Specimen Description BLOOD RIGHT WRIST  Final  Special Requests   Final    BOTTLES DRAWN AEROBIC AND ANAEROBIC Blood Culture results may not be optimal due to an inadequate volume of blood received in culture bottles   Culture   Final    NO GROWTH 5 DAYS Performed at [facility], [place] [address] [place], [place] [address]    Report Status 11/30/2019  FINAL  Final  Respiratory Panel by RT PCR (Flu A&B, Covid) - Nasopharyngeal Swab     Status: Abnormal   Collection Time: 11/25/19 10:13 PM   Specimen: Nasopharyngeal Swab  Result Value Ref Range Status   SARS Coronavirus 2 by RT PCR POSITIVE (A) NEGATIVE Final    Comment: RESULT CALLED TO, READ BACK BY AND VERIFIED WITH: B. ORORIO,RN 2348 11/25/2019 T. TYSOR (NOTE) SARS-CoV-2 target nucleic acids are DETECTED. SARS-CoV-2 RNA is generally detectable in upper respiratory specimens  during the acute phase of infection. Positive results are indicative of the presence of the identified virus, but do not rule out bacterial infection or co-infection with other pathogens not detected by the test. Clinical correlation with patient history and other diagnostic information is necessary to determine patient infection status. The expected result is Negative. Fact Sheet for Patients:  PinkCheek.be Fact Sheet for Healthcare Providers: GravelBags.it This test is not yet approved or cleared by the [place] FDA and  has been authorized for detection and/or diagnosis of SARS-CoV-2 by FDA under an Emergency Use Authorization (EUA).  This EUA will remain in effect (meaning this test can be used)  for the duration of  the COVID-19 declaration under Section 564(b)(1) of the Act, 21 U.S.C. section 360bbb-3(b)(1), unless the authorization is terminated or revoked sooner.    Influenza A by PCR NEGATIVE NEGATIVE Final   Influenza B by PCR NEGATIVE NEGATIVE Final    Comment: (NOTE) The Xpert Xpress SARS-CoV-2/FLU/RSV assay is intended as an aid in  the diagnosis of influenza from Nasopharyngeal swab specimens and  should not be used as a sole basis for treatment. Nasal washings and  aspirates are unacceptable for Xpert Xpress SARS-CoV-2/FLU/RSV  testing. Fact Sheet for Patients: PinkCheek.be Fact Sheet for  Healthcare Providers: GravelBags.it This test is not yet approved or cleared by the [place] FDA and  has been authorized for detection and/or diagnosis of SARS-CoV-2 by  FDA under an Emergency Use Authorization (EUA). This EUA will remain  in effect (meaning this test can be used) for the duration of the  Covid-19 declaration under Section 564(b)(1) of the Act, 21  U.S.C. section 360bbb-3(b)(1), unless the authorization is  terminated or revoked. Performed at [facility], [place] [address], [place], [place] [address]   Urine culture     Status: None   Collection Time: 11/26/19  3:10 AM   Specimen: Urine, Random  Result Value Ref Range Status   Specimen Description URINE, RANDOM  Final   Special Requests NONE  Final   Culture   Final    NO GROWTH Performed at [facility], [place] [address], [place], [place] [address]    Report Status 11/27/2019 FINAL  Final     Time coordinating discharge: Over 30 minutes  SIGNED:   Shawna Clamp, MD  [facility] 11/30/2019, 1:52 PM Pager   If 7PM-7AM, please contact night-coverage [email or web address]

## 2019-11-30 NOTE — NC FL2 (Signed)
[place] MEDICAID FL2 LEVEL OF CARE SCREENING TOOL     IDENTIFICATION  Patient Name: Vincent Dunlap Birthdate: [birth year] Sex: male Admission Date (Current Location): 11/25/2019  [facility] and [place] Number:  Herbalist and Address:  [facility], [place] [address], [place], [place] [address]      Provider Number: [id number]  Attending Physician Name and Address:  Shawna Clamp, MD  Relative Name and Phone Number:  no relatives, has friend [name] [phone]    Current Level of Care: Hospital Recommended Level of Care: [place] Prior Approval Number:    Date Approved/Denied:   PASRR Number: [id number]  Discharge Plan: SNF    Current Diagnoses: Patient Active Problem List   Diagnosis Date Noted  . DNR (do not resuscitate) 11/29/2019  . COVID-19 virus infection 11/28/2019  . Palliative care by specialist   . Pain, generalized   . Weakness generalized   . Acute blood loss anemia   . GI bleed 11/26/2019  . Bone metastases ([place]) 11/26/2019  . 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 Upper GI bleed 11/25/2019  . Elevated troponin 11/25/2019  . Leukocytosis 11/25/2019  . Encephalopathy   . Acute metabolic encephalopathy 65/46/5035  . Fall 09/28/2018  . Acute kidney injury ([place]) 09/28/2018  . Syncope 09/28/2018  . Alcohol use 09/28/2018  . 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 PAD (peripheral artery disease) ([place]) 04/20/2018  . B12 deficiency 02/28/2018  . Upper airway cough syndrome 12/31/2017  . Solitary pulmonary nodule on lung CT 12/31/2017  . Bronchiectasis without complication ([place]) 46/56/8127  . Adult failure to thrive 10/25/2017  . Microcytic anemia 10/25/2017  . Abnormal CT of the chest 10/25/2017  . Peripheral neuropathy 10/25/2017  . Hyponatremia 10/15/2017  . Severe protein-calorie malnutrition ([place])   . UTI (urinary tract infection) 07/17/2017  . Sepsis ([place]) 07/17/2017  .  Hypotension 07/17/2017  . Chest pain 07/17/2017  . Osteomyelitis of toe of right foot ([place]) 07/17/2017  . Pressure injury of skin 07/17/2017  . Acute coronary syndrome ([place]) 07/22/2016  . COPD GOLD II   . 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 Chronic systolic CHF (congestive heart failure) ([place])   . Chronic pain syndrome   . Pressure ulcer 07/18/2016  . Acute respiratory failure with hypoxia ([place]) 07/17/2016  . Essential hypertension 07/17/2016  . Dizziness 07/17/2016  . Congestive heart failure ([place])   . CAD (coronary artery disease), native coronary artery   . NSTEMI (non-ST elevated myocardial infarction) ([place])   . At risk for falling 05/20/2014  . Thrombocytopenia ([place]) 08/30/2012    Orientation RESPIRATION BLADDER Height & Weight     Self  (See d/c summary) Incontinent Weight: 71.8 kg Height:  5' 11"  (180.3 cm)  BEHAVIORAL SYMPTOMS/MOOD NEUROLOGICAL BOWEL NUTRITION STATUS      Incontinent Diet(dysphagia 1 with thin liquids)  AMBULATORY STATUS COMMUNICATION OF NEEDS Skin   Total Care Does not communicate Bruising                       Personal Care Assistance Level of Assistance  Total care       Total Care Assistance: Maximum assistance   Functional Limitations Info  Sight, Hearing, Speech Sight Info: Impaired Hearing Info: Impaired Speech Info: Impaired    SPECIAL CARE FACTORS FREQUENCY  (hospice care)                    Contractures Contractures  Info: Not present    Additional Factors Info  Psychotropic Code Status Info: DNR Allergies Info: Chantix Varenicline Psychotropic Info: Elavil 75 mg PO at bedtime   Isolation Precautions Info: COVID     Current Medications (11/30/2019):  This is the current hospital active medication list Current Facility-Administered Medications  Medication Dose Route Frequency Provider Last Rate Last Admin  . acetaminophen (TYLENOL) tablet 650 mg  650 mg Oral Q6H PRN Toy Baker, MD       Or   . acetaminophen (TYLENOL) suppository 650 mg  650 mg Rectal Q6H PRN Doutova, Anastassia, MD      . amitriptyline (ELAVIL) tablet 75 mg  75 mg Oral QHS Knox Royalty, NP   75 mg at 11/29/19 2330  . Chlorhexidine Gluconate Cloth 2 % PADS 6 each  6 each Topical Daily Pokhrel, Laxman, MD   6 each at 11/29/19 0737  . LORazepam (ATIVAN) tablet 1 mg  1 mg Oral Q4H PRN Knox Royalty, NP      . morphine CONCENTRATE 10 MG/0.5ML oral solution 5 mg  5 mg Oral Q1H PRN Knox Royalty, NP      . ondansetron (ZOFRAN) tablet 4 mg  4 mg Oral Q6H PRN Toy Baker, MD       Or  . ondansetron (ZOFRAN) injection 4 mg  4 mg Intravenous Q6H PRN Doutova, Anastassia, MD      . sodium chloride flush (NS) 0.9 % injection 3 mL  3 mL Intravenous Once Toy Baker, MD         Discharge Medications: Please see discharge summary for a list of discharge medications.  Relevant Imaging Results:  Relevant Lab Results:   Additional Information [id number]:: Will return with [facility] following  Pollie Friar, RN

## 2019-11-30 NOTE — Progress Notes (Signed)
Report called to [facility].

## 2019-11-30 NOTE — Discharge Instructions (Signed)
Patient has been discharged to SNF with hospice following.

## 2019-11-30 NOTE — Plan of Care (Signed)
Patient discharged to SNF on hospice care.

## 2019-11-30 NOTE — TOC Transition Note (Signed)
Transition of Care [facility]) - CM/SW Discharge Note   Patient Details  Name: Vincent Dunlap MRN: [id number] Date of Birth: April 18, 1941  [facility] CM/SW Contact:  Pollie Friar, RN Phone Number: 11/30/2019, 2:29 PM   Clinical Narrative:    MD has decided pt is medically ready for d/c to [facility] with hospice today. CM has reached out to [place] at [facility] and they have his bed ready.  CM called [place] and updated her. CM called Cheri with [place] and updated her.  Pt to transport via [facility].  Room: 226B Number for report: [id number]   Final next level of care: Skilled Nursing Facility Barriers to Discharge: No Barriers Identified   Patient Goals and CMS Choice   CMS Medicare.gov Compare Post Acute Care list provided to:: Patient Represenative (must comment) Choice offered to / list presented to : (Girlfriend)  Discharge Placement PASRR number recieved: 11/29/19            Patient chooses bed at: [facility]) Patient to be transferred to facility by: [place] Name of family member notified: [place] Patient and family notified of of transfer: 11/30/19  Discharge Plan and Services In-house Referral: Clinical Social Work Discharge Planning Services: CM Consult Post Acute Care Choice: Hospice                      HH Agency: [place] Date [place]: 11/30/19   Representative spoke with at [place]: Cheri  Social Determinants of Health ([place]) Interventions     Readmission Risk Interventions No flowsheet data found.

## 2019-11-30 NOTE — TOC Initial Note (Addendum)
Transition of Care [facility]) - Initial/Assessment Note    Patient Details  Name: Vincent Dunlap MRN: [id number] Date of Birth: 1941-01-26  Transition of Care [facility]) CM/SW Contact:    Pollie Friar, RN Phone Number: 11/30/2019, 12:19 PM  Clinical Narrative:                 CM reached out to [facility] with [facility] about hospice at the facility and she stated it was up to the family who they would like to use if they are in contract with [employer].  CM reached out to [place] and she was hoping the patient would be able to transition to [facility] facility when d/ced. She does understand though that if he doesn't qualify at the time of d/c for residential hospice that he will return to [place] with hospice following and then could transition to the hospice home when he qualifies.  CM reached out to [place] with [place] and she will follow with the facility and [place]. She is aware of the plan and will follow to see how the patient does while in the hospital. TOC following for further d/c needs.   Addendum: TOC consulted for assisting with HPOA. At this time patients orientation waxes and wanes and he is not appropriate to sign HPOA paperwork. Palliative has been working with his significant other and a MOST form has been filled out.   Expected Discharge Plan: Skilled Nursing Facility(with hospice following) Barriers to Discharge: Continued Medical Work up   Patient Goals and CMS Choice   CMS Medicare.gov Compare Post Acute Care list provided to:: Patient Represenative (must comment) Choice offered to / list presented to : (significant other)  Expected Discharge Plan and Services Expected Discharge Plan: Skilled Nursing Facility(with hospice following) In-house Referral: Clinical Social Work Discharge Planning Services: CM Consult Post Acute Care Choice: Hospice Living arrangements for the past 2 months: [place] Date [place]: 11/30/19   Representative spoke with at [place]: [place] Arrangements/Services Living arrangements for the past 2 months: [place] Lives with:: Facility Resident                   Activities of Daily Living Home Assistive Devices/Equipment: Wheelchair ADL Screening (condition at time of admission) Patient's cognitive ability adequate to safely complete daily activities?: No Is the patient deaf or have difficulty hearing?: Yes Does the patient have difficulty seeing, even when wearing glasses/contacts?: No Does the patient have difficulty concentrating, remembering, or making decisions?: Yes Patient able to express need for assistance with ADLs?: Yes Does the patient have difficulty dressing or bathing?: Yes Independently performs ADLs?: No Communication: Independent Dressing (OT): Needs assistance Is this a change from baseline?: Pre-admission baseline Grooming: Needs assistance Is this a change from baseline?: Pre-admission baseline Feeding: Needs assistance Is this a change from baseline?: Pre-admission baseline Bathing: Needs assistance Is this a change from baseline?: Pre-admission baseline Toileting: Needs assistance Is this a change from baseline?: Pre-admission baseline In/Out Bed: Needs assistance Is this a change from baseline?: Pre-admission baseline Walks in Home: Dependent Is this a change from baseline?: Pre-admission baseline Does the patient have difficulty walking or climbing stairs?: Yes Weakness of Legs: Both Weakness of Arms/Hands: Both  Permission Sought/Granted                  Emotional Assessment              Admission diagnosis:  GI bleed [K92.2] 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 DNR (do not resuscitate) 11/29/2019  . COVID-19 virus infection 11/28/2019  . Palliative care by specialist   . Pain, generalized   . Weakness generalized   . Acute blood loss anemia   . GI bleed 11/26/2019  . Bone metastases ([place]) 11/26/2019  . 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 Upper GI bleed 11/25/2019  . Elevated troponin 11/25/2019  . Leukocytosis 11/25/2019  . Encephalopathy   . Acute metabolic encephalopathy 45/40/9811  . Fall 09/28/2018  . Acute kidney injury ([place]) 09/28/2018  . Syncope 09/28/2018  . Alcohol use 09/28/2018  . Depression, recurrent ([place]) 05/04/2018  . PAD (peripheral artery disease) ([place]) 04/20/2018  . B12 deficiency 02/28/2018  . Upper airway cough syndrome 12/31/2017  . Solitary pulmonary nodule on lung CT 12/31/2017  . Bronchiectasis without complication ([place]) 91/47/8295  . Adult failure to thrive 10/25/2017  . Microcytic anemia 10/25/2017  . Abnormal CT of the chest 10/25/2017  . Peripheral neuropathy 10/25/2017  . Hyponatremia 10/15/2017  . Severe protein-calorie malnutrition ([place])   . UTI (urinary tract infection) 07/17/2017  . Sepsis ([place]) 07/17/2017  . Hypotension 07/17/2017  . Chest pain 07/17/2017  . Osteomyelitis of toe of right foot ([place]) 07/17/2017  . Pressure injury of skin 07/17/2017  . Acute coronary syndrome ([place]) 07/22/2016  . COPD GOLD II   . 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 Chronic systolic CHF (congestive heart failure) ([place])   . Chronic pain syndrome   . Pressure ulcer 07/18/2016  . Acute respiratory failure with hypoxia ([place]) 07/17/2016  . Essential hypertension 07/17/2016  . Dizziness 07/17/2016  . Congestive heart failure ([place])   . CAD (coronary artery disease), native coronary artery   . NSTEMI (non-ST  elevated myocardial infarction) ([place])   . At risk for falling 05/20/2014  . Thrombocytopenia ([place]) 08/30/2012   PCP:  Hendricks Limes, MD Pharmacy[facility]   [id number] [place] [id number]: [phone]: [phone]    Social Determinants of Health (SDOH) Interventions    Readmission Risk Interventions No flowsheet data found.

## 2020-01-07 DEATH — deceased
